# Patient Record
Sex: Male | Born: 1970 | Hispanic: Yes | Marital: Married | Smoking: Never smoker
Health system: Southern US, Community
[De-identification: ages and names within clinical notes are randomized; demographics above are authoritative.]

## PROBLEM LIST (undated history)

## (undated) DIAGNOSIS — I10 Essential (primary) hypertension: Secondary | ICD-10-CM

## (undated) DIAGNOSIS — F419 Anxiety disorder, unspecified: Secondary | ICD-10-CM

## (undated) DIAGNOSIS — R7303 Prediabetes: Secondary | ICD-10-CM

## (undated) DIAGNOSIS — E7800 Pure hypercholesterolemia, unspecified: Secondary | ICD-10-CM

## (undated) HISTORY — DX: Essential (primary) hypertension: I10

---

## 2016-01-13 ENCOUNTER — Encounter (HOSPITAL_COMMUNITY): Payer: Self-pay | Admitting: *Deleted

## 2016-01-13 DIAGNOSIS — R112 Nausea with vomiting, unspecified: Secondary | ICD-10-CM | POA: Insufficient documentation

## 2016-01-13 DIAGNOSIS — R1013 Epigastric pain: Secondary | ICD-10-CM | POA: Insufficient documentation

## 2016-01-13 LAB — URINALYSIS, ROUTINE W REFLEX MICROSCOPIC
BILIRUBIN URINE: NEGATIVE
GLUCOSE, UA: NEGATIVE mg/dL
Ketones, ur: NEGATIVE mg/dL
Leukocytes, UA: NEGATIVE
Nitrite: NEGATIVE
Protein, ur: NEGATIVE mg/dL
SPECIFIC GRAVITY, URINE: 1.014 (ref 1.005–1.030)
pH: 7.5 (ref 5.0–8.0)

## 2016-01-13 LAB — CBC
HCT: 41.8 % (ref 39.0–52.0)
HEMOGLOBIN: 14.7 g/dL (ref 13.0–17.0)
MCH: 31.1 pg (ref 26.0–34.0)
MCHC: 35.2 g/dL (ref 30.0–36.0)
MCV: 88.4 fL (ref 78.0–100.0)
PLATELETS: 224 10*3/uL (ref 150–400)
RBC: 4.73 MIL/uL (ref 4.22–5.81)
RDW: 13.3 % (ref 11.5–15.5)
WBC: 8.9 10*3/uL (ref 4.0–10.5)

## 2016-01-13 LAB — COMPREHENSIVE METABOLIC PANEL
ALBUMIN: 3.3 g/dL — AB (ref 3.5–5.0)
ALK PHOS: 81 U/L (ref 38–126)
ALT: 31 U/L (ref 17–63)
AST: 17 U/L (ref 15–41)
Anion gap: 8 (ref 5–15)
BUN: 8 mg/dL (ref 6–20)
CALCIUM: 8.6 mg/dL — AB (ref 8.9–10.3)
CHLORIDE: 105 mmol/L (ref 101–111)
CO2: 25 mmol/L (ref 22–32)
CREATININE: 0.75 mg/dL (ref 0.61–1.24)
GFR calc Af Amer: >60 mL/min (ref >60)
GFR calc non Af Amer: >60 mL/min (ref >60)
GLUCOSE: 119 mg/dL — AB (ref 65–99)
Potassium: 3.7 mmol/L (ref 3.5–5.1)
SODIUM: 138 mmol/L (ref 135–145)
Total Bilirubin: 0.8 mg/dL (ref 0.3–1.2)
Total Protein: 7.2 g/dL (ref 6.5–8.1)

## 2016-01-13 LAB — URINE MICROSCOPIC-ADD ON

## 2016-01-13 LAB — LIPASE, BLOOD: LIPASE: 23 U/L (ref 11–51)

## 2016-01-13 NOTE — ED Notes (Signed)
The pt is c/o abd  Pain especially in his epigastric area for 2 days  n v  No diarrhea

## 2016-01-14 ENCOUNTER — Emergency Department (HOSPITAL_COMMUNITY)
Admission: EM | Admit: 2016-01-14 | Discharge: 2016-01-14 | Disposition: A | Discharge status: Home / Self Care | Payer: Self-pay | Attending: Emergency Medicine | Admitting: Emergency Medicine

## 2016-01-14 DIAGNOSIS — R112 Nausea with vomiting, unspecified: Secondary | ICD-10-CM

## 2016-01-14 DIAGNOSIS — R1013 Epigastric pain: Secondary | ICD-10-CM

## 2016-01-14 MED ORDER — GI COCKTAIL ~~LOC~~
30.0000 mL | Freq: Once | ORAL | Status: AC
  Administered 2016-01-14: 30 mL via ORAL
  Filled 2016-01-14: qty 30

## 2016-01-14 MED ORDER — ONDANSETRON HCL 4 MG/2ML IJ SOLN
4.0000 mg | Freq: Once | INTRAMUSCULAR | Status: AC
  Administered 2016-01-14: 4 mg via INTRAVENOUS
  Filled 2016-01-14: qty 2

## 2016-01-14 MED ORDER — METOCLOPRAMIDE HCL 10 MG PO TABS: 10.0000 mg | ORAL_TABLET | Freq: Four times a day (QID) | ORAL | Status: DC | PRN

## 2016-01-14 MED ORDER — OMEPRAZOLE 20 MG PO CPDR: 20.0000 mg | DELAYED_RELEASE_CAPSULE | Freq: Every day | ORAL | Status: DC

## 2016-01-14 MED ORDER — SODIUM CHLORIDE 0.9 % IV SOLN: 1000.0000 mL | INTRAVENOUS | Status: DC

## 2016-01-14 MED ORDER — PANTOPRAZOLE SODIUM 40 MG IV SOLR
40.0000 mg | Freq: Once | INTRAVENOUS | Status: AC
  Administered 2016-01-14: 40 mg via INTRAVENOUS
  Filled 2016-01-14: qty 40

## 2016-01-14 MED ORDER — SODIUM CHLORIDE 0.9 % IV SOLN
1000.0000 mL | Freq: Once | INTRAVENOUS | Status: AC
  Administered 2016-01-14: 1000 mL via INTRAVENOUS

## 2016-01-14 NOTE — ED Notes (Signed)
Translator phone used to give meds.

## 2016-01-14 NOTE — ED Provider Notes (Signed)
CSN: [id number]     Arrival date & time 01/13/16  2149 History  By signing my name below, I, Evon Slack, attest that this documentation has been prepared under the direction and in the presence of Dione Booze, MD. Electronically Signed: Evon Slack, ED Scribe. 01/14/2016. 2:19 AM.    Chief Complaint  Patient presents with  . Abdominal Pain    Patient is a 45 y.o. male presenting with abdominal pain. The history is provided by the patient. No language interpreter was used.  Abdominal Pain Associated symptoms: nausea and vomiting   Associated symptoms: no fever    HPI Comments: Carl Briggs is a 45 y.o. male who presents to the Emergency Department complaining of new epigastric abdominal pain onset 2-3 days prior. Pt states that has associate nausea and vomiting. Pt states that the pain is worse when eating. Pt doesn't report any medications PTA. Denies fever or other related symptoms.   History reviewed. No pertinent past medical history. History reviewed. No pertinent past surgical history. No family history on file. Social History  Substance Use Topics  . Smoking status: Never Smoker   . Smokeless tobacco: None  . Alcohol Use: No    Review of Systems  Constitutional: Negative for fever.  Gastrointestinal: Positive for nausea, vomiting and abdominal pain.  All other systems reviewed and are negative.    Allergies  Review of patient's allergies indicates no known allergies.  Home Medications   Prior to Admission medications   Not on File   BP 158/92 mmHg  Pulse 116  Temp(Src) 97.6 F (36.4 C) (Oral)  Resp 18  SpO2 98%   Physical Exam  Constitutional: He is oriented to person, place, and time. He appears well-developed and well-nourished. No distress.  HENT:  Head: Normocephalic and atraumatic.  Eyes: Conjunctivae and EOM are normal. Pupils are equal, round, and reactive to light.  Neck: Normal range of motion. Neck supple. No JVD present.   Cardiovascular: Normal rate, regular rhythm and normal heart sounds.   No murmur heard. Pulmonary/Chest: Effort normal and breath sounds normal. He has no wheezes. He has no rales. He exhibits no tenderness.  Abdominal: Soft. He exhibits no mass. Bowel sounds are decreased. There is tenderness. There is no rebound and no guarding.  Mild to moderates epigastric tenderness.   Musculoskeletal: Normal range of motion. He exhibits no edema.  Lymphadenopathy:    He has no cervical adenopathy.  Neurological: He is alert and oriented to person, place, and time. No cranial nerve deficit. He exhibits normal muscle tone. Coordination normal.  Skin: Skin is warm and dry. No rash noted.  Psychiatric: He has a normal mood and affect. His behavior is normal. Judgment and thought content normal.  Nursing note and vitals reviewed.   ED Course  Procedures (including critical care time) DIAGNOSTIC STUDIES: Oxygen Saturation is 98% on RA, normal by my interpretation.    COORDINATION OF CARE: 2:18 AM-Discussed treatment plan with pt at bedside and pt agreed to plan.     Labs Review Results for orders placed or performed during the hospital encounter of 01/14/16  Lipase, blood  Result Value Ref Range   Lipase 23 11 - 51 U/L  Comprehensive metabolic panel  Result Value Ref Range   Sodium 138 135 - 145 mmol/L   Potassium 3.7 3.5 - 5.1 mmol/L   Chloride 105 101 - 111 mmol/L   CO2 25 22 - 32 mmol/L   Glucose, Bld 119 (H) 65 - 99 mg/dL  BUN 8 6 - 20 mg/dL   Creatinine, Ser 0.45 0.61 - 1.24 mg/dL   Calcium 8.6 (L) 8.9 - 10.3 mg/dL   Total Protein 7.2 6.5 - 8.1 g/dL   Albumin 3.3 (L) 3.5 - 5.0 g/dL   AST 17 15 - 41 U/L   ALT 31 17 - 63 U/L   Alkaline Phosphatase 81 38 - 126 U/L   Total Bilirubin 0.8 0.3 - 1.2 mg/dL   GFR calc non Af Amer >60 >60 mL/min   GFR calc Af Amer >60 >60 mL/min   Anion gap 8 5 - 15  CBC  Result Value Ref Range   WBC 8.9 4.0 - 10.5 K/uL   RBC 4.73 4.22 - 5.81 MIL/uL    Hemoglobin 14.7 13.0 - 17.0 g/dL   HCT 40.9 81.1 - 91.4 %   MCV 88.4 78.0 - 100.0 fL   MCH 31.1 26.0 - 34.0 pg   MCHC 35.2 30.0 - 36.0 g/dL   RDW 78.2 95.6 - 21.3 %   Platelets 224 150 - 400 K/uL  Urinalysis, Routine w reflex microscopic (not at [facility])  Result Value Ref Range   Color, Urine YELLOW YELLOW   APPearance CLOUDY (A) CLEAR   Specific Gravity, Urine 1.014 1.005 - 1.030   pH 7.5 5.0 - 8.0   Glucose, UA NEGATIVE NEGATIVE mg/dL   Hgb urine dipstick TRACE (A) NEGATIVE   Bilirubin Urine NEGATIVE NEGATIVE   Ketones, ur NEGATIVE NEGATIVE mg/dL   Protein, ur NEGATIVE NEGATIVE mg/dL   Nitrite NEGATIVE NEGATIVE   Leukocytes, UA NEGATIVE NEGATIVE  Urine microscopic-add on  Result Value Ref Range   Squamous Epithelial / LPF 0-5 (A) NONE SEEN   WBC, UA 0-5 0 - 5 WBC/hpf   RBC / HPF 0-5 0 - 5 RBC/hpf   Bacteria, UA RARE (A) NONE SEEN   Casts GRANULAR CAST (A) NEGATIVE  I have personally reviewed and evaluated these lab results as part of my medical decision-making.   MDM   Final diagnoses:  Epigastric pain  Nausea and vomiting, vomiting of unspecified type    Epigastric pain and vomiting. This may be a viral illness or possible Gastritis or ulcer disease or GERD. Screening labs are obtained and are unremarkable. He has no prior records and the [facility] system. He was given IV fluids, ondansetron, GI cocktail, and IV pantoprazole. Following this, he was feeling much better. He is discharged with prescriptions for metoclopramide and omeprazole.  I personally performed the services described in this documentation, which was scribed in my presence. The recorded information has been reviewed and is accurate.         Dione Booze, MD 01/14/16 [phone]

## 2016-01-14 NOTE — Discharge Instructions (Signed)
Dolor abdominal en adultos (Abdominal Pain, Adult) El dolor puede tener muchas causas. Normalmente la causa del dolor abdominal no es una enfermedad y Teacher, English as a foreign language sin Clinical research associate. Frecuentemente puede controlarse y tratarse en casa. Su mdico le Chartered certified accountant examen fsico y posiblemente solicite anlisis de sangre y radiografas para ayudar a Teacher, adult education la gravedad de su dolor. Sin embargo, en [employer], debe transcurrir ms tiempo antes de que se pueda Pension scheme manager una causa evidente del dolor. Antes de llegar a ese punto, es posible que su mdico no sepa si necesita ms pruebas o un tratamiento ms profundo. INSTRUCCIONES PARA EL CUIDADO EN EL HOGAR  Est atento al dolor para ver si hay cambios. Las siguientes indicaciones ayudarn a Chief Strategy Officer que pueda sentir:  [place] solo medicamentos de venta libre o recetados, segn las indicaciones del mdico.  No tome laxantes a menos que se lo haya indicado su mdico.  Pruebe con Ardelia Mems dieta lquida absoluta (caldo, t o agua) segn se lo indique su mdico. Introduzca gradualmente una dieta normal, segn su tolerancia. SOLICITE ATENCIN MDICA SI:  Tiene dolor abdominal sin explicacin.  Tiene dolor abdominal relacionado con nuseas o diarrea.  Tiene dolor cuando orina o defeca.  Experimenta dolor abdominal que lo despierta de noche.  Tiene dolor abdominal que empeora o mejora cuando come alimentos.  Tiene dolor abdominal que empeora cuando come alimentos grasosos.  Tiene fiebre. SOLICITE ATENCIN MDICA DE INMEDIATO SI:   El dolor no desaparece en un plazo mximo de 2horas.  No deja de (vomitar).  El Social research officer, government se siente solo en partes del abdomen, como el lado derecho o la parte inferior izquierda del abdomen.  Evaca materia fecal sanguinolenta o negra, de aspecto alquitranado. ASEGRESE DE QUE:  Comprende estas instrucciones.  Controlar su afeccin.  Recibir ayuda de inmediato si no mejora o si empeora.   Esta  informacin no tiene Marine scientist el consejo del mdico. Asegrese de hacerle al mdico cualquier pregunta que tenga.   Document Released: 12/09/2005 Document Revised: 12/30/2014 Elsevier Interactive Patient Education 2016 [place] y Vmitos (Nausea and Vomiting) La nusea es la sensacin de Tree surgeon en el estmago o de la necesidad de vomitar. El vmito es un reflejo por el que los contenidos del estmago salen por la boca. El vmito puede ocasionar prdida de lquidos del organismo (deshidratacin). Los nios y los [employer] pueden deshidratarse rpidamente (en especial si tambin tienen diarrea). Las nuseas y los vmitos son sntoma de un trastorno o enfermedad. Es importante Energy manager causa de los sntomas. CAUSAS  Irritacin directa de la membrana que cubre el [place]. Esta irritacin puede ser resultado del aumento de la produccin de cido, (reflujo gastroesofgico), infecciones, intoxicacin alimentaria, ciertos medicamentos (como antinflamatorios no esteroideos), consumo de alcohol o de tabaco.  Seales del cerebro.Estas seales pueden ser un dolor de cabeza, exposicin al calor, trastornos del odo interno, aumento de la presin en el cerebro por lesiones, infeccin, un tumor o conmocin cerebral, estmulos emocionales o problemas metablicos.  Una obstruccin en el tracto gastrointestinal (obstruccin intestinal).  Ciertas enfermedades como la diabetes, problemas en la vescula biliar, apendicitis, problemas renales, cncer, sepsis, sntomas atpicos de infarto o trastornos alimentarios.  Tratamientos mdicos como la quimioterapia y la radiacin.  Medicamentos que inducen al sueo (anestesia general) durante Clementeen Hoof. DIAGNSTICO  El mdico podr solicitarle algunos anlisis si los problemas no mejoran luego de algunos das. Tambin podrn pedirle anlisis si los sntomas son graves o si el motivo de los vmitos  o las nuseas no est claro. Los  [employer] ser:   Anlisis de [place].  Anlisis de [place].  Pruebas de materia fecal.  Cultivos (para buscar evidencias de infeccin).  Radiografas u otros estudios por imgenes. Los [employer] de las pruebas lo ayudarn al mdico a tomar decisiones acerca del mejor curso de tratamiento o la necesidad de [employer].  TRATAMIENTO  Debe estar bien hidratado. Beba con frecuencia pequeas cantidades de lquido.Puede beber agua, bebidas deportivas, caldos claros o comer pequeos trocitos de hielo o gelatina para mantenerse hidratado.Cuando coma, hgalo lentamente para evitar las nuseas.Hay medicamentos para evitar las nuseas que pueden aliviarlo.  INSTRUCCIONES PARA EL CUIDADO DOMICILIARIO  Si su mdico le prescribe medicamentos tmelos como se le haya indicado.  Si no tiene hambre, no se fuerce a comer. Sin embargo, es necesario que tome lquidos.  Si tiene hambre alimntese con una dieta normal, a menos que el mdico le indique otra cosa.  Los mejores alimentos son Neomia Dear combinacin de carbohidratos complejos (arroz, trigo, papas, pan), carnes magras, yogur, frutas y Sports administrator.  Evite los alimentos ricos en grasas porque dificultan la digestin.  Beba gran cantidad de lquido para mantener la orina de tono claro o color amarillo plido.  Si est deshidratado, consulte a su mdico para que le d instrucciones especficas para volver a hidratarlo. Los signos de deshidratacin son:  Franz Dell sed.  Labios y boca secos.  Mareos.  Larose Kells.  Disminucin de la frecuencia y cantidad de la [place].  Confusin.  Tiene el pulso o la respiracin acelerados. SOLICITE ATENCIN MDICA DE INMEDIATO SI:  Vomita sangre o algo similar a la borra del caf.  La materia fecal (heces) es negra o tiene [place].  Sufre una cefalea grave o rigidez en el cuello.  Se siente confundido.  Siente dolor abdominal intenso.  Tiene dolor en el pecho o dificultad para respirar.  No  orina por 8 horas.  Tiene la piel fra y pegajosa.  Sigue vomitando durante ms de 24 a 48 horas.  Tiene fiebre. ASEGRESE QUE:   Comprende estas instrucciones.  Controlar su enfermedad.  Solicitar ayuda inmediatamente si no mejora o si empeora.   Esta informacin no tiene Theme park manager el consejo del mdico. Asegrese de hacerle al mdico cualquier pregunta que tenga.   Document Released: 12/29/2007 Document Revised: 03/02/2012 Elsevier Interactive Patient Education 2016 [employer].  Metoclopramide tablets Qu es este medicamento? La METOCLOPRAMIDA se Ladonna Snide para tratar los sntomas de la enfermedad del reflujo gastroesofgico (ERGE) como el acidez estomacal. Tambin se [place] para tratar pacientes con capacidad lenta para vaciar el estmago y el tracto intestinal. Este medicamento puede ser utilizado para otros usos; si tiene alguna pregunta consulte con su proveedor de atencin mdica o con su farmacutico. Qu le debo informar a mi profesional de la salud antes de tomar este medicamento? Necesita saber si usted presenta alguno de los [employer] o situaciones: -cncer de mama -depresin -diabetes -insuficiencia cardiaca -alta presin sangunea -enfermedad renal -enfermedad heptica -enfermedad de Parkinson o un trastorno de movimiento -feocromocitoma -convulsiones -obstruccin, sangrado o perforacin estomacal -una reaccin alrgica o inusual a la metoclopramida, procainamida, sulfitos, a otros medicamentos, alimentos, colorantes o conservantes -si est embarazada o buscando quedar embarazada -si est amamantando a un beb Cmo debo utilizar este medicamento? Tome este medicamento por va oral con un vaso de agua. Siga las instrucciones de la etiqueta del [place]. Tome este medicamento con el estmago vaco, por lo menos 30 minutos antes de comer. [employer]  sus dosis a intervalos regulares. No tome su medicamento con una frecuencia mayor a la  indicada. No deje de tomarlo excepto si as lo indica su mdico o su profesional de [employer]. Su farmacutico le dar una [place] del medicamento especial con cada receta y relleno. Asegrese de leer esta informacin cada vez cuidadosamente. Hable con su pediatra para informarse acerca del uso de este medicamento en nios. Puede requerir atencin especial. Sobredosis: Pngase en contacto inmediatamente con un centro toxicolgico o una sala de urgencia si usted cree que haya tomado demasiado medicamento. ATENCIN: [employer] es solo para usted. No comparta este medicamento con nadie. Qu sucede si me olvido de una dosis? Si olvida una dosis, tmela lo antes posible. Si es casi la hora de la prxima dosis, tome slo esa dosis. No tome dosis adicionales o dobles. Qu puede interactuar con este medicamento? -acetaminofeno -ciclosporina -digoxina -medicamentos para la presin sangunea -medicamentos para la diabetes, incluso la insulina -medicamentos para la fiebre del heno y Education officer, environmental -medicamentos para la depresin, especialmente un inhibidor de monoamina oxidasa (IMAO) -medicamentos para la enfermedad de Parkinson, como levodopa -analgsicos o medicamentos para dormir -Tax adviser Puede ser que esta lista no menciona todas las posibles interacciones. Informe a su profesional de [employer] de [employer] productos a base de hierbas, medicamentos de [place] o suplementos nutritivos que est tomando. Si usted fuma, consume bebidas alcohlicas o si utiliza drogas ilegales, indqueselo tambin a su profesional de [employer]. Algunas sustancias pueden interactuar con su medicamento. A qu debo estar atento al usar [employer]? Es posible que transcurran algunas semanas para que su problema estomacal mejore. Sin embargo, no tome este medicamento durante ms de 12 semanas. Cuanto ms tiempo tome [employer], y cuanto ms se toma, mayor son sus posibilidades de Pensions consultant  secundarios graves. Si es un paciente de edad [place], una mujer o si tiene diabetes podr Warehouse manager un mayor riesgo de experimentar efectos secundarios de [employer]. Comunquese con su mdico inmediatamente si empieza a experimentar movimientos que no puede controlar como, chasquido de labios, movimientos rpidos de [address], movimientos incontrolables o involuntarios de los ojos, [place], brazos y piernas o espasmos musculares. Los pacientes y sus familias deben estar atentos si empeora la depresin o ideas suicidas. Tambin est atento a cambios repentinos o severos de emocin, tales como el sentirse ansioso, agitado, lleno de pnico, irritable, hostil, agresivo, impulsivo, inquietud severa, demasiado excitado y hiperactivo o dificultad para conciliar el sueo. Si esto ocurre, especialmente al comenzar con el tratamiento o al cambiar de dosis, comunquese con su mdico. No se trate usted mismo para fiebre alta. Consulte a su mdico o su profesional de la salud por asesoramiento. Puede experimentar mareos o somnolencia. No conduzca ni utilice maquinaria ni haga nada que Scientist, research (life sciences) en estado de alerta hasta que sepa cmo le afecta este medicamento. No se siente ni se ponga de pie con rapidez, especialmente si es un paciente de edad avanzada. Esto reduce el riesgo de mareos o [employer]. El alcohol puede aumentar los mareos y la somnolencia. Evite consumir bebidas alcohlicas. Qu efectos secundarios puedo tener al [employer] este medicamento? Efectos secundarios que debe informar a su mdico o a Producer, television/film/video de la salud tan pronto como sea posible: -Therapist, art como erupcin cutnea, picazn o urticarias, hinchazn de la cara, labios o lengua -produccin de [employer] anormal en mujeres -agrandamiento de las tetillas (hombres) o los pechos (mujeres) -cambio en la manera de caminar -dificultad para  moverse, hablar o tragar -babeo, relamerse los labios o movimientos rpidos de la  lengua -sudoracin excesiva -fiebre -movimientos involuntarios o incontrolables de ojos, cabeza, brazos y piernas -latidos cardiacos irregulares o palpitaciones -espasmos o tics nerviosos musculares -cansancio o debilidad inusual Efectos secundarios que, por lo general, no requieren atencin mdica (debe informarlos a su mdico o a su profesional de la salud si persisten o si son molestos): -cambios en el deseo sexual o capacidad -humor deprimido -diarrea -dificultad para dormir -dolor de cabeza -cambios en la menstruacin -inquietud o nerviosismo Puede ser que esta lista no menciona todos los posibles efectos secundarios. Comunquese a su mdico por asesoramiento mdico [employer]. Usted puede informar los efectos secundarios a la FDA por telfono al 1-800-FDA-1088. Dnde debo guardar mi medicina? Mantngala fuera del alcance de los nios. Gurdela a [employer], entre 20 y 25 grados C (60 y 80 grados F). Protjala de la luz. Mantenga el envase bien cerrado. Deseche los medicamentos que no haya utilizado, despus de la fecha de vencimiento. ATENCIN: Este folleto es un resumen. Puede ser que no cubra toda la posible informacin. Si usted tiene preguntas acerca de esta medicina, consulte con su mdico, su farmacutico o su profesional de Radiographer, therapeutic.    2016, Elsevier/Gold Standard. (2015-01-31 00:00:00)  Omeprazole tablets (OTC) Qu es este medicamento? El OMEPRAZOL previene la produccin de cido en el estmago. Ayuda a tratar los sntomas de la acidez estomacal. Santel medicamento se puede adquirir sin receta. Este producto no debe usarse por perodos prolongados, a menos que su mdico o su profesional de Radiographer, therapeutic. Este medicamento puede ser utilizado para otros usos; si tiene alguna pregunta consulte con su proveedor de atencin mdica o con su farmacutico. Qu le debo informar a mi profesional de la salud antes de tomar este medicamento? Necesita saber si  usted presenta alguno de los siguientes problemas o situaciones: -heces de color oscuro o con sangre -dolor en el pecho -dificultad para tragar -si ha tenido Merchant navy officer durante ms de 3 meses -si tiene Merchant navy officer con [employer], aturdimiento o sudoracin -enfermedad heptica -dolor estomacal -prdida de peso que no tiene explicacin -vmito con Retail buyer -sibilancias -una reaccin alrgica o inusual al omeprazol, a otros medicamentos, alimentos, colorantes o conservantes -si est embarazada o buscando quedar embarazada -si est amamantando a un beb Cmo debo utilizar este medicamento? Tome este medicamento por va oral. Siga las instrucciones de la etiqueta del [place]. Si est tomando este medicamento sin receta, toma una tableta cada da. No usar durante ms de [address] o repetir un curso de tratamiento ms a menudo que cada 4 meses a menos que as lo indique su mdico o profesional de Radiographer, therapeutic. Tome su dosis a intervalos regulares cada 24 horas. Trague la tableta entera con un vaso de agua. No triture, rompe ni mstique este medicamento. Este medicamento acta mejor si se lo toma con el estmago vaco 30 minutos antes del desayuno. Si esta usando este medicamento con una receta proporcionada por su mdico o profesional de la salud, siga las instrucciones que recibi. No tome su medicamento con una frecuencia mayor a la indicada. Hable con su pediatra para informarse acerca del uso de este medicamento en nios. Puede requerir atencin especial. Sobredosis: Pngase en contacto inmediatamente con un centro toxicolgico o una sala de urgencia si usted cree que haya tomado demasiado medicamento. ATENCIN: [employer] es solo para usted. No comparta este medicamento con nadie. Qu sucede si me olvido de una dosis?  Si olvida una dosis, tmela lo antes posible. Si es casi la hora de la prxima dosis, tome slo esa dosis. No tome dosis adicionales o dobles. Qu puede interactuar con  este medicamento? No tome esta medicina con ninguno de los siguientes medicamentos: -atazanavir -clopidogrel -nelfinavir Esta medicina tambin puede interactuar con los siguientes medicamentos: -ampicilina -ciertos medicamentos para la ansiedad o para conciliar el sueo -ciertos medicamentos que tratan o previenen cogulos sanguneos, como warfarina -ciclosporina -diazepam -digoxina -disulfiram -sales de hierro -metotrexato -mofetil micofenolato -fenitona -medicamentos recetados para infecciones micticas o por levadura, tales como itraconazol, quetoconazol, voriconazol -saquinavir -tacrolimo Puede ser que esta lista no menciona todas las posibles interacciones. Informe a su profesional de [employer] de [employer] productos a base de hierbas, medicamentos de [place] o suplementos nutritivos que est tomando. Si usted fuma, consume bebidas alcohlicas o si utiliza drogas ilegales, indqueselo tambin a su profesional de [employer]. Algunas sustancias pueden interactuar con su medicamento. A qu debo estar atento al usar [employer]? Puede necesitar varios das de tratamiento para que mejore su acidez estomacal. Consulte a su mdico o a su profesional de la salud si su afeccin no comienza a Scientist, clinical (histocompatibility and immunogenetics) o si empeora. No trate la diarrea con productos de [employer]. Comunquese con su mdico si tiene diarrea que dura ms de 2 das o si es severa y [place]. No se trate usted mismo para la acidez estomacal con este medicamento durante ms de 14 das seguidos. Slo debe automedicarse con este medicamento durante perodos de tratamiento de 2 semanas cada 4 meses. Si sus sntomas vuelven a aparecer poco despus de haber finalizado el tratamiento o dentro de los prximos 4 meses, consulte a su mdico o su profesional de Radiographer, therapeutic. Qu efectos secundarios puedo tener al [employer] este medicamento? Efectos secundarios que debe informar a su mdico o a Producer, television/film/video de la salud tan pronto como sea  posible: -Therapist, art como erupcin cutnea, picazn o urticarias, hinchazn de la cara, labios o lengua -dolor de hueso, msculo o articulaciones -problemas respiratorios -dolor u opresin en el pecho -orina de color amarillo oscuro o marrn -diarrea -mareos -pulso cardiaco rpido, irregular -sensacin de desmayos o aturdimiento -fiebre o dolor de garganta -espasmos musculares -palpitaciones -enrojecimiento, formacin de ampollas, descamacin o distensin de la piel, inclusive dentro de la boca -convulsiones -temblores -sangrado o magulladuras inusuales -cansancio o debilidad inusual -color amarillento de los ojos o la piel Efectos secundarios que, por lo general, no requieren atencin mdica (debe informarlos a su mdico o a su profesional de la salud si persisten o si son molestos): -estreimiento -boca seca -dolor de cabeza -heces sueltas -nauseas Puede ser que esta lista no menciona todos los posibles efectos secundarios. Comunquese a su mdico por asesoramiento mdico [employer]. Usted puede informar los efectos secundarios a la FDA por telfono al 1-800-FDA-1088. Dnde debo guardar mi medicina? Mantngala fuera del alcance de los nios. Gurdela a [employer], entre 20 y 25 grados C (63 y 41 grados F). Protjala de la luz y de la humedad. Deseche los medicamentos que no haya utilizado, despus de la fecha de vencimiento. ATENCIN: Este folleto es un resumen. Puede ser que no cubra toda la posible informacin. Si usted tiene preguntas acerca de esta medicina, consulte con su mdico, su farmacutico o su profesional de Radiographer, therapeutic.    2016, Elsevier/Gold Standard. (2015-01-31 00:00:00)

## 2018-11-07 ENCOUNTER — Inpatient Hospital Stay (HOSPITAL_COMMUNITY): Payer: Self-pay

## 2018-11-07 ENCOUNTER — Encounter (HOSPITAL_COMMUNITY): Payer: Self-pay

## 2018-11-07 ENCOUNTER — Inpatient Hospital Stay (HOSPITAL_COMMUNITY)
Admission: EM | Admit: 2018-11-07 | Discharge: 2018-11-09 | DRG: 378 | Disposition: A | Discharge status: Home / Self Care | Payer: Self-pay | Attending: Internal Medicine | Admitting: Internal Medicine

## 2018-11-07 ENCOUNTER — Other Ambulatory Visit: Payer: Self-pay

## 2018-11-07 DIAGNOSIS — D62 Acute posthemorrhagic anemia: Secondary | ICD-10-CM | POA: Diagnosis present

## 2018-11-07 DIAGNOSIS — K5731 Diverticulosis of large intestine without perforation or abscess with bleeding: Principal | ICD-10-CM | POA: Diagnosis present

## 2018-11-07 DIAGNOSIS — K625 Hemorrhage of anus and rectum: Secondary | ICD-10-CM

## 2018-11-07 DIAGNOSIS — D649 Anemia, unspecified: Secondary | ICD-10-CM

## 2018-11-07 DIAGNOSIS — K922 Gastrointestinal hemorrhage, unspecified: Secondary | ICD-10-CM

## 2018-11-07 DIAGNOSIS — Z79899 Other long term (current) drug therapy: Secondary | ICD-10-CM

## 2018-11-07 HISTORY — DX: Gastrointestinal hemorrhage, unspecified: K92.2

## 2018-11-07 LAB — CBC WITH DIFFERENTIAL/PLATELET
Abs Immature Granulocytes: 0.01 10*3/uL (ref 0.00–0.07)
Basophils Absolute: 0 10*3/uL (ref 0.0–0.1)
Basophils Relative: 0 %
EOS ABS: 0.1 10*3/uL (ref 0.0–0.5)
EOS PCT: 2 %
HCT: 20.6 % — ABNORMAL LOW (ref 39.0–52.0)
Hemoglobin: 6.3 g/dL — CL (ref 13.0–17.0)
Immature Granulocytes: 0 %
Lymphocytes Relative: 40 %
Lymphs Abs: 1.9 10*3/uL (ref 0.7–4.0)
MCH: 29.6 pg (ref 26.0–34.0)
MCHC: 30.6 g/dL (ref 30.0–36.0)
MCV: 96.7 fL (ref 80.0–100.0)
Monocytes Absolute: 0.3 10*3/uL (ref 0.1–1.0)
Monocytes Relative: 6 %
NRBC: 0 % (ref 0.0–0.2)
Neutro Abs: 2.4 10*3/uL (ref 1.7–7.7)
Neutrophils Relative %: 52 %
PLATELETS: 183 10*3/uL (ref 150–400)
RBC: 2.13 MIL/uL — AB (ref 4.22–5.81)
RDW: 14 % (ref 11.5–15.5)
WBC: 4.7 10*3/uL (ref 4.0–10.5)

## 2018-11-07 LAB — POC OCCULT BLOOD, ED: Fecal Occult Bld: POSITIVE — AB

## 2018-11-07 LAB — ABO/RH: ABO/RH(D): O POS

## 2018-11-07 LAB — COMPREHENSIVE METABOLIC PANEL
ALBUMIN: 3.3 g/dL — AB (ref 3.5–5.0)
ALK PHOS: 63 U/L (ref 38–126)
ALT: 18 U/L (ref 0–44)
ANION GAP: 4 — AB (ref 5–15)
AST: 20 U/L (ref 15–41)
BUN: 9 mg/dL (ref 6–20)
CALCIUM: 8 mg/dL — AB (ref 8.9–10.3)
CO2: 24 mmol/L (ref 22–32)
Chloride: 110 mmol/L (ref 98–111)
Creatinine, Ser: 0.66 mg/dL (ref 0.61–1.24)
GFR calc non Af Amer: >60 mL/min (ref >60)
Glucose, Bld: 108 mg/dL — ABNORMAL HIGH (ref 70–99)
POTASSIUM: 3.8 mmol/L (ref 3.5–5.1)
SODIUM: 138 mmol/L (ref 135–145)
Total Bilirubin: 0.4 mg/dL (ref 0.3–1.2)
Total Protein: 5.8 g/dL — ABNORMAL LOW (ref 6.5–8.1)

## 2018-11-07 LAB — PREPARE RBC (CROSSMATCH)

## 2018-11-07 MED ORDER — IOHEXOL 300 MG/ML  SOLN
100.0000 mL | Freq: Once | INTRAMUSCULAR | Status: AC | PRN
  Administered 2018-11-07: 100 mL via INTRAVENOUS

## 2018-11-07 MED ORDER — POTASSIUM CHLORIDE IN NACL 20-0.9 MEQ/L-% IV SOLN
INTRAVENOUS | Status: DC
  Administered 2018-11-07 – 2018-11-08 (×2): via INTRAVENOUS
  Filled 2018-11-07 (×4): qty 1000

## 2018-11-07 MED ORDER — PANTOPRAZOLE SODIUM 40 MG IV SOLR
40.0000 mg | Freq: Two times a day (BID) | INTRAVENOUS | Status: DC
  Administered 2018-11-07 – 2018-11-09 (×4): 40 mg via INTRAVENOUS
  Filled 2018-11-07 (×4): qty 40

## 2018-11-07 MED ORDER — PEG 3350-KCL-NA BICARB-NACL 420 G PO SOLR
4000.0000 mL | Freq: Once | ORAL | Status: AC
  Administered 2018-11-08: 4000 mL via ORAL
  Filled 2018-11-07 (×2): qty 4000

## 2018-11-07 MED ORDER — SODIUM CHLORIDE 0.9% IV SOLUTION
Freq: Once | INTRAVENOUS | Status: AC
  Administered 2018-11-07: 17:00:00 via INTRAVENOUS

## 2018-11-07 MED ORDER — SODIUM CHLORIDE 0.9 % IV SOLN
INTRAVENOUS | Status: DC
  Administered 2018-11-07: 1 mL via INTRAVENOUS

## 2018-11-07 NOTE — ED Notes (Addendum)
Family now at bedside. Pt denies any discomfort or pain at this time.

## 2018-11-07 NOTE — H&P (Signed)
PCP:   Patient, No Pcp Per   Chief Complaint:  Rectal bleeding  HPI: this is a healthy 47 y/o gentleman who presents with started feeling bad since Tuesday when he developed stomach ache and diarrhea.  His diarrhea was initially watery and then it became lightly bloody and more recently frank bright red blood.  He denies abdominal pain but states he more has a discomfort on the sides of his abdomen.  He also reports a headache.  He states he also began swelling on his arms and on his face.  He states he also started getting dizzy and he is nauseous but he has not actually vomited.  He has never had a colonoscopy.  He denies any fevers, chills, rash or confusion.  He denies any chest pain or being short of breath.  No notes in his family has a symptoms he denies eating anything different from his family members on Monday.  He did see a physician, likely at the urgent care he was given some medications but it did not help him.  He finally decided to come to our ER.  He has never had a colonoscopy.  In the ER his hemoglobin was 6.3 the hospitalist have been asked to admit the patient.  History provided by the patient and his wife is present at bedside.  A translator was used  Review of Systems:  The patient denies anorexia, fever, weight loss,, vision loss, decreased hearing, hoarseness, chest pain, syncope, dyspnea on exertion, peripheral edema, balance deficits, hemoptysis, abdominal pain, melena, hematochezia, severe indigestion/heartburn, BRBPR, hematuria, incontinence, genital sores, muscle weakness, suspicious skin lesions, transient blindness, difficulty walking, depression, unusual weight change, abnormal bleeding, enlarged lymph nodes, angioedema, and breast masses.  Past Medical History: History reviewed. None History reviewed. None  Medications: Prior to Admission medications   Medication Sig Start Date End Date Taking? Authorizing Provider  acetaminophen (TYLENOL) 325 MG tablet Take 325  mg by mouth every 6 (six) hours as needed for headache.   Yes [provider]  omeprazole (PRILOSEC) 20 MG capsule Take 1 capsule (20 mg total) by mouth daily. 01/14/16  Yes Dione BoozeGlick, David, MD  metoCLOPramide (REGLAN) 10 MG tablet Take 1 tablet (10 mg total) by mouth every 6 (six) hours as needed for nausea or vomiting. Patient not taking: Reported on 11/07/2018 01/14/16   Dione BoozeGlick, David, MD  omeprazole (PRILOSEC) 40 MG capsule Take 40 mg by mouth daily. 11/05/18   [provider]    Allergies:  No Known Allergies  Social History:  reports that he has never smoked. He does not have any smokeless tobacco history on file. He reports that he does not drink alcohol. His drug history is not on file.  Family History: History reviewed. Diabetes  Physical Exam: Vitals:   11/07/18 0911 11/07/18 0912 11/07/18 0915 11/07/18 0930  BP: 120/81  117/80 120/73  Pulse: 86 85 87 83  Resp:  16 12   SpO2: 100% 100% 100% 100%  Weight:      Height:        General:  Alert and oriented times three, well developed and nourished, no acute distress Eyes: PERRLA, pink conjunctiva, no scleral icterus ENT: Moist oral mucosa, neck supple, no thyromegaly Lungs: clear to ascultation, no wheeze, no crackles, no use of accessory muscles Cardiovascular: regular rate and rhythm, no regurgitation, no gallops, no murmurs. No carotid bruits, no JVD Abdomen: soft, positive BS, non-tender, non-distended, no organomegaly, not an acute abdomen GU: not examined Neuro: CN II -  XII grossly intact, sensation intact Musculoskeletal: strength 5/5 all extremities, no clubbing, cyanosis or edema Skin: no rash, no subcutaneous crepitation, no decubitus Psych: appropriate patient   Labs on Admission:  Recent Labs    11/07/18 0918  NA 138  K 3.8  CL 110  CO2 24  GLUCOSE 108*  BUN 9  CREATININE 0.66  CALCIUM 8.0*   Recent Labs    11/07/18 0918  AST 20  ALT 18  ALKPHOS 63  BILITOT 0.4  PROT 5.8*   ALBUMIN 3.3*   No results for input(s): LIPASE, AMYLASE in the last 72 hours. Recent Labs    11/07/18 0918  WBC 4.7  NEUTROABS 2.4  HGB 6.3*  HCT 20.6*  MCV 96.7  PLT 183   No results for input(s): CKTOTAL, CKMB, CKMBINDEX, TROPONINI in the last 72 hours. Invalid input(s): POCBNP No results for input(s): DDIMER in the last 72 hours. No results for input(s): HGBA1C in the last 72 hours. No results for input(s): CHOL, HDL, LDLCALC, TRIG, CHOLHDL, LDLDIRECT in the last 72 hours. No results for input(s): TSH, T4TOTAL, T3FREE, THYROIDAB in the last 72 hours.  Invalid input(s): FREET3 No results for input(s): VITAMINB12, FOLATE, FERRITIN, TIBC, IRON, RETICCTPCT in the last 72 hours.  Micro Results: No results found for this or any previous visit (from the past 240 hour(s)).   Radiological Exams on Admission: No results found.  Assessment/Plan Present on Admission: . GI bleed . Acute posthemorrhagic anemia -Admit to MedSurg with telemetry -N.p.o., IV fluid hydration -IV Protonix twice daily -2 units packed red blood cell transfusion ordered, posttransfusion labs -Serial H&H ordered -Stool cultures ordered/GI biofire -GI consulted by ER staff and will see the patient -Pain meds as needed   Deatrice Spanbauer 11/07/2018, 11:06 AM

## 2018-11-07 NOTE — ED Provider Notes (Signed)
[facility] Provider Note   CSN: [id number] Arrival date & time: 11/07/18  0849     History   Chief Complaint Chief Complaint  Patient presents with  . Rectal Bleeding    HPI Carl Briggs is a 47 y.o. male.  The history is provided by the patient. No language interpreter was used.  Rectal Bleeding  Quality:  Bright red Amount:  Moderate Duration:  2 days Timing:  Constant Chronicity:  New Context: spontaneously   Similar prior episodes: no   Relieved by:  Nothing Worsened by:  Nothing Ineffective treatments:  None tried Risk factors: no anticoagulant use   Pt complains of bright red rectal bleeding.  Pt reports bleeding since yesterday.   Pt feels weak today.  No abdominal pain  History reviewed. No pertinent past medical history.  There are no active problems to display for this patient.   History reviewed. No pertinent surgical history.      Home Medications    Prior to Admission medications   Medication Sig Start Date End Date Taking? Authorizing Provider  metoCLOPramide (REGLAN) 10 MG tablet Take 1 tablet (10 mg total) by mouth every 6 (six) hours as needed for nausea or vomiting. 01/14/16   Dione BoozeGlick, David, MD  omeprazole (PRILOSEC) 20 MG capsule Take 1 capsule (20 mg total) by mouth daily. 01/14/16   Dione BoozeGlick, David, MD    Family History History reviewed. No pertinent family history.  Social History Social History   Tobacco Use  . Smoking status: Never Smoker  Substance Use Topics  . Alcohol use: No  . Drug use: Not on file     Allergies   Patient has no known allergies.   Review of Systems Review of Systems  Gastrointestinal: Positive for blood in stool and hematochezia.  All other systems reviewed and are negative.    Physical Exam Updated Vital Signs BP 120/73   Pulse 83   Resp 12   Ht 5\' 5"  (1.651 m)   Wt 72.6 kg   SpO2 100%   BMI 26.63 kg/m   Physical Exam  Constitutional: He  appears well-developed and well-nourished.  HENT:  Head: Normocephalic and atraumatic.  Right Ear: External ear normal.  Left Ear: External ear normal.  Mouth/Throat: Oropharynx is clear and moist.  Eyes: Conjunctivae are normal.  Neck: Neck supple.  Cardiovascular: Normal rate and regular rhythm.  No murmur heard. Pulmonary/Chest: Effort normal and breath sounds normal.  Abdominal: Soft. There is no tenderness.  Genitourinary:  Genitourinary Comments: Bright red blood  Musculoskeletal: Normal range of motion. He exhibits no edema.  Neurological: He is alert.  Skin: Skin is warm and dry.  Psychiatric: He has a normal mood and affect.  Nursing note and vitals reviewed.    ED Treatments / Results  Labs (all labs ordered are listed, but only abnormal results are displayed) Labs Reviewed  CBC WITH DIFFERENTIAL/PLATELET - Abnormal; Notable for the following components:      Result Value   RBC 2.13 (*)    Hemoglobin 6.3 (*)    HCT 20.6 (*)    All other components within normal limits  COMPREHENSIVE METABOLIC PANEL - Abnormal; Notable for the following components:   Glucose, Bld 108 (*)    Calcium 8.0 (*)    Total Protein 5.8 (*)    Albumin 3.3 (*)    Anion gap 4 (*)    All other components within normal limits  POC OCCULT BLOOD, ED - Abnormal;  Notable for the following components:   Fecal Occult Bld POSITIVE (*)    All other components within normal limits  TYPE AND SCREEN  ABO/RH    EKG None  Radiology No results found.  Procedures .Critical Care Performed by: Elson Areas, PA-C Authorized by: Elson Areas, PA-C   Critical care provider statement:    Critical care time (minutes):  45   Critical care start time:  11/07/2018 10:30 AM   Critical care end time:  11/07/2018 11:04 AM   Critical care time was exclusive of:  Separately billable procedures and treating other patients   Critical care was necessary to treat or prevent imminent or life-threatening  deterioration of the following conditions:  Dehydration and circulatory failure   Critical care was time spent personally by me on the following activities:  Discussions with consultants, evaluation of patient's response to treatment, examination of patient, ordering and performing treatments and interventions, ordering and review of laboratory studies, ordering and review of radiographic studies, pulse oximetry, re-evaluation of patient's condition, obtaining history from patient or surrogate and review of old charts   I assumed direction of critical care for this patient from another provider in my specialty: no     (including critical care time)  Medications Ordered in ED Medications - No data to display   Initial Impression / Assessment and Plan / ED Course  I have reviewed the triage vital signs and the nursing notes.  Pertinent labs & imaging results that were available during my care of the patient were reviewed by me and considered in my medical decision making (see chart for details).     MDM  Hemoglobin 6.3.  I spoke to Dr. Bosie Clos who advised medicine to admit.  Pt is unassigned.  transfusion ordered.  Hospitalist consulted and will see here for admission     Final Clinical Impressions(s) / ED Diagnoses   Final diagnoses:  Rectal bleeding  Anemia, unspecified type    ED Discharge Orders    None       Osie Cheeks 11/07/18 1109    Shaune Pollack, MD 11/08/18 [id number]

## 2018-11-07 NOTE — Consult Note (Signed)
Referring Provider: Dr. Joneen Roachrosley Primary Care Physician:  Patient, No Pcp Per Primary Gastroenterologist:  Gentry FitzUnassigned  Reason for Consultation:  GI bleed  HPI: Carl Briggs is a 47 y.o. male with onset of profuse rectal bleeding that started this past Tuesday when he had 3 episodes of a large amount of red blood from his rectum. Had 2 episodes Wednesday that were of smaller volume. Thursday is BM was normal with only staining of blood seen but yesterday he had a large amount of blood per rectum. Felt nauseous without vomiting. Felt dizzy. Denies melena. Has been having abdominal discomfort (starts in his back and radiates to his front) but denies that it is painful. Denies rectal pain. Hgb 6.3 (14.7 in 2017). Abd/pelvis CT with contrast showed a possible focal small bowel ileus without any obstruction. Other findings per radiology report. Years ago he had an episode of rectal bleeding that was a lot less in volume than now. A few weeks ago he was taking Naproxen daily but denies any this week and denies other NSAIDs. Denies alcohol. No known family history of colon cancer. Has never had a colonoscopy. Wife at bedside. History obtained with medical translator using [employer] system.  History reviewed. No pertinent past medical history.  History reviewed. No pertinent surgical history.  Prior to Admission medications   Medication Sig Start Date End Date Taking? Authorizing Provider  acetaminophen (TYLENOL) 325 MG tablet Take 325 mg by mouth every 6 (six) hours as needed for headache.   Yes [provider]  omeprazole (PRILOSEC) 20 MG capsule Take 1 capsule (20 mg total) by mouth daily. 01/14/16  Yes Dione BoozeGlick, David, MD  metoCLOPramide (REGLAN) 10 MG tablet Take 1 tablet (10 mg total) by mouth every 6 (six) hours as needed for nausea or vomiting. Patient not taking: Reported on 11/07/2018 01/14/16   Dione BoozeGlick, David, MD  omeprazole (PRILOSEC) 40 MG capsule Take 40 mg by mouth daily. 11/05/18    [provider]    Scheduled Meds: . sodium chloride   Intravenous Once  . [START ON 11/08/2018] polyethylene glycol-electrolytes  4,000 mL Oral Once   Continuous Infusions: . sodium chloride     PRN Meds:.  Allergies as of 11/07/2018  . (No Known Allergies)    History reviewed. No pertinent family history.  Social History   Socioeconomic History  . Marital status: Married    Spouse name: Not on file  . Number of children: Not on file  . Years of education: Not on file  . Highest education level: Not on file  Occupational History  . Not on file  Social Needs  . Financial resource strain: Not on file  . Food insecurity:    Worry: Not on file    Inability: Not on file  . Transportation needs:    Medical: Not on file    Non-medical: Not on file  Tobacco Use  . Smoking status: Never Smoker  Substance and Sexual Activity  . Alcohol use: No  . Drug use: Not on file  . Sexual activity: Not on file  Lifestyle  . Physical activity:    Days per week: Not on file    Minutes per session: Not on file  . Stress: Not on file  Relationships  . Social connections:    Talks on phone: Not on file    Gets together: Not on file    Attends religious service: Not on file    Active member of club or organization: Not on file  Attends meetings of clubs or organizations: Not on file    Relationship status: Not on file  . Intimate partner violence:    Fear of current or ex partner: Not on file    Emotionally abused: Not on file    Physically abused: Not on file    Forced sexual activity: Not on file  Other Topics Concern  . Not on file  Social History Narrative  . Not on file    Review of Systems: All negative except as stated above in HPI.  Physical Exam: Vital signs: Vitals:   11/07/18 1428 11/07/18 1443  BP: 117/66 125/80  Pulse: 78 72  Resp: 10 16  Temp: 98 F (36.7 C) 98.4 F (36.9 C)  SpO2: 100%      General:   Lethargic, Well-developed,  well-nourished, pleasant and cooperative in NAD Head: normocephalic, atraumatic Eyes: anicteric sclera ENT: oropharynx clear Neck: supple, nontender Lungs:  Clear throughout to auscultation.   No wheezes, crackles, or rhonchi. No acute distress. Heart:  Regular rate and rhythm; no murmurs, clicks, rubs,  or gallops. Abdomen: soft, nontender, nondistended, +BS  Rectal:  Deferred Ext: no edema  GI:  Lab Results: Recent Labs    11/07/18 0918  WBC 4.7  HGB 6.3*  HCT 20.6*  PLT 183   BMET Recent Labs    11/07/18 0918  NA 138  K 3.8  CL 110  CO2 24  GLUCOSE 108*  BUN 9  CREATININE 0.66  CALCIUM 8.0*   LFT Recent Labs    11/07/18 0918  PROT 5.8*  ALBUMIN 3.3*  AST 20  ALT 18  ALKPHOS 63  BILITOT 0.4   PT/INR No results for input(s): LABPROT, INR in the last 72 hours.   Studies/Results: Ct Abdomen Pelvis W Contrast  Result Date: 11/07/2018 CLINICAL DATA:  Generalized weakness and bloody stools the past 4 days. Diffuse abdominal pain radiating to the back during that time. Nausea. EXAM: CT ABDOMEN AND PELVIS WITH CONTRAST TECHNIQUE: Multidetector CT imaging of the abdomen and pelvis was performed using the standard protocol following bolus administration of intravenous contrast. CONTRAST:  OMNIPAQUE IOHEXOL 300 MG/ML  SOLN COMPARISON:  None. FINDINGS: Lower chest: Breathing motion artifacts. Small left lower lobe nodule measuring 3 mm on image number 43 series 5. Hepatobiliary: Diffuse low density of the liver. Probable adjacent gallstones in the gallbladder neck spanning 6 mm. No gallbladder wall thickening or pericholecystic fluid. Pancreas: Unremarkable. No pancreatic ductal dilatation or surrounding inflammatory changes. Spleen: Normal in size without focal abnormality. Adrenals/Urinary Tract: Normal appearing adrenal glands. Right renal cyst. Normal appearing left kidney, ureters and urinary bladder. Stomach/Bowel: Multiple proximal sigmoid colon diverticula  without evidence of diverticulitis. Mildly prominent small bowel loops containing air-fluid levels in the left mid abdomen anteriorly with no wall thickening. No abnormally dilated loops are seen. Normal appearing stomach and appendix. Vascular/Lymphatic: No significant vascular findings are present. No enlarged abdominal or pelvic lymph nodes. Reproductive: Prostate is unremarkable. Other: No abdominal wall hernia or abnormality. No abdominopelvic ascites. Musculoskeletal: On minimal lumbar and lower thoracic spine degenerative changes. IMPRESSION: 1. Mildly prominent small bowel loops containing air-fluid levels in the left mid abdomen anteriorly with no wall thickening. This is most likely due to mild focal ileus. 2. Sigmoid diverticulosis. 3. Diffuse hepatic steatosis. 4. Cholelithiasis. 5. 3 mm left lower lobe nodule. No follow-up needed if patient is low-risk. Non-contrast chest CT can be considered in 12 months if patient is high-risk. This recommendation follows the consensus statement:  Guidelines for Management of Incidental Pulmonary Nodules Detected on CT Images: From the Fleischner Society 2017; Radiology 2017; 284:228-243. Electronically Signed   By: Beckie Salts M.D.   On: 11/07/2018 13:14    Impression/Plan: Rectal bleeding likely due to a diverticular source but malignancy still possible. Doubt ischemic colitis. Agree with blood transfusions. Supportive care. Will give colon prep tomorrow for an inpt colonoscopy in Monday 11/09/18. Risks/benefits of colonoscopy discussed with patient through medical translator and he agrees to proceed. Clear liquid diet today and tomorrow and NPO p MN on Sunday night for Monday's colonoscopy. He is concerned about swelling in his face and hands that started yesterday and will defer to primary team.     LOS: 0 days   Shirley Friar  11/07/2018, 3:57 PM  Questions please call [phone]

## 2018-11-07 NOTE — ED Triage Notes (Signed)
Pt arrived via POV; pt c/o wkness related to bloody stools since Tues; pt states that he hears ringing in his ears. Pt states that he had tiny bit of bleeding a year ago but not like now; pt c/o nausea; pt c/o of abd pain that radiates around abd to back.

## 2018-11-08 ENCOUNTER — Encounter (HOSPITAL_COMMUNITY): Payer: Self-pay

## 2018-11-08 ENCOUNTER — Other Ambulatory Visit: Payer: Self-pay

## 2018-11-08 DIAGNOSIS — D649 Anemia, unspecified: Secondary | ICD-10-CM

## 2018-11-08 LAB — HEMOGLOBIN AND HEMATOCRIT, BLOOD
HCT: 25.7 % — ABNORMAL LOW (ref 39.0–52.0)
HCT: 27.3 % — ABNORMAL LOW (ref 39.0–52.0)
HEMOGLOBIN: 8.2 g/dL — AB (ref 13.0–17.0)
HEMOGLOBIN: 8.9 g/dL — AB (ref 13.0–17.0)

## 2018-11-08 LAB — GASTROINTESTINAL PANEL BY PCR, STOOL (REPLACES STOOL CULTURE)
Adenovirus F40/41: NOT DETECTED
Astrovirus: NOT DETECTED
Campylobacter species: NOT DETECTED
Cryptosporidium: NOT DETECTED
Cyclospora cayetanensis: NOT DETECTED
ENTAMOEBA HISTOLYTICA: NOT DETECTED
ENTEROAGGREGATIVE E COLI (EAEC): NOT DETECTED
ENTEROPATHOGENIC E COLI (EPEC): DETECTED — AB
Enterotoxigenic E coli (ETEC): NOT DETECTED
GIARDIA LAMBLIA: NOT DETECTED
NOROVIRUS GI/GII: NOT DETECTED
Plesimonas shigelloides: NOT DETECTED
Rotavirus A: NOT DETECTED
SALMONELLA SPECIES: NOT DETECTED
SHIGELLA/ENTEROINVASIVE E COLI (EIEC): NOT DETECTED
Sapovirus (I, II, IV, and V): NOT DETECTED
Shiga like toxin producing E coli (STEC): NOT DETECTED
VIBRIO CHOLERAE: NOT DETECTED
Vibrio species: NOT DETECTED
YERSINIA ENTEROCOLITICA: NOT DETECTED

## 2018-11-08 LAB — BASIC METABOLIC PANEL
Anion gap: 6 (ref 5–15)
BUN: 7 mg/dL (ref 6–20)
CHLORIDE: 104 mmol/L (ref 98–111)
CO2: 23 mmol/L (ref 22–32)
CREATININE: 0.8 mg/dL (ref 0.61–1.24)
Calcium: 7.7 mg/dL — ABNORMAL LOW (ref 8.9–10.3)
GFR calc non Af Amer: >60 mL/min (ref >60)
Glucose, Bld: 87 mg/dL (ref 70–99)
POTASSIUM: 3.7 mmol/L (ref 3.5–5.1)
Sodium: 133 mmol/L — ABNORMAL LOW (ref 135–145)

## 2018-11-08 LAB — CBC
HCT: 27.2 % — ABNORMAL LOW (ref 39.0–52.0)
HEMOGLOBIN: 9.2 g/dL — AB (ref 13.0–17.0)
MCH: 30 pg (ref 26.0–34.0)
MCHC: 33.8 g/dL (ref 30.0–36.0)
MCV: 88.6 fL (ref 80.0–100.0)
PLATELETS: 186 10*3/uL (ref 150–400)
RBC: 3.07 MIL/uL — AB (ref 4.22–5.81)
RDW: 13.7 % (ref 11.5–15.5)
WBC: 4.5 10*3/uL (ref 4.0–10.5)
nRBC: 0 % (ref 0.0–0.2)

## 2018-11-08 LAB — HIV ANTIBODY (ROUTINE TESTING W REFLEX): HIV Screen 4th Generation wRfx: NONREACTIVE

## 2018-11-08 NOTE — Progress Notes (Signed)
PROGRESS NOTE    Carl Briggs  UJW:[id number] DOB: 11-20-1971 DOA: 11/07/2018 PCP: Patient, No Pcp Per  Brief Narrative: Carl Briggs is a 47 y.o. male with onset of profuse rectal bleeding x4days -Multiple episodes of large amount of hematochezia/bright red blood per rectum, denied any abdominal pain, no nausea vomiting no fevers or chills. -ED his hemoglobin was 6.8, Hemoccult positive   Assessment & Plan:  Acute lower GI bleeding/hematochezia -Painless, likely diverticular -Transfused 2 units of PRBC on admission last night -Appreciate gastroenterology consult -Currently on clears, plan for colonoscopy tomorrow    Acute posthemorrhagic anemia -As above, monitor CBC every 12   DVT prophylaxis:SCds Code Status: Full code Family Communication: Wife at bedside Disposition Plan: Home pending above work-up  Consultants:   [facility]   Procedures:   Antimicrobials:    Subjective: -Feels better, no further significant bleeding had just trace amount of dark blood this morning  Objective: Vitals:   11/07/18 1821 11/07/18 1836 11/07/18 2205 11/08/18 0823  BP: 109/70 116/71 107/71 104/88  Pulse: 67 69  74  Resp: 18 18 16    Temp: 98.6 F (37 C) 98.9 F (37.2 C) 98.6 F (37 C) 98.2 F (36.8 C)  TempSrc: Oral Oral Oral Oral  SpO2: 100% 100% 99% 99%  Weight:      Height:        Intake/Output Summary (Last 24 hours) at 11/08/2018 1149 Last data filed at 11/08/2018 0600 Gross per 24 hour  Intake 1550 ml  Output -  Net 1550 ml   Filed Weights   11/07/18 0858  Weight: 72.6 kg    Examination:  General exam: Appears calm and comfortable  Respiratory system: Clear to auscultation. Respiratory effort normal. Cardiovascular system: S1 & S2 heard, RRR. Gastrointestinal system: Abdomen is nondistended, soft and nontender.Normal bowel sounds heard. Central nervous system: Alert and oriented. No focal neurological deficits. Extremities:  No edema Skin: No rashes, lesions or ulcers Psychiatry: Judgement and insight appear normal. Mood & affect appropriate.     Data Reviewed:   CBC: Recent Labs  Lab 11/07/18 0918 11/08/18 0027 11/08/18 0937  WBC 4.7  --   --   NEUTROABS 2.4  --   --   HGB 6.3* 8.2* 8.9*  HCT 20.6* 25.7* 27.3*  MCV 96.7  --   --   PLT 183  --   --    Basic Metabolic Panel: Recent Labs  Lab 11/07/18 0918 11/08/18 0027  NA 138 133*  K 3.8 3.7  CL 110 104  CO2 24 23  GLUCOSE 108* 87  BUN 9 7  CREATININE 0.66 0.80  CALCIUM 8.0* 7.7*   GFR: Estimated Creatinine Clearance: 98.2 mL/min (by C-G formula based on SCr of 0.8 mg/dL). Liver Function Tests: Recent Labs  Lab 11/07/18 0918  AST 20  ALT 18  ALKPHOS 63  BILITOT 0.4  PROT 5.8*  ALBUMIN 3.3*   No results for input(s): LIPASE, AMYLASE in the last 168 hours. No results for input(s): AMMONIA in the last 168 hours. Coagulation Profile: No results for input(s): INR, PROTIME in the last 168 hours. Cardiac Enzymes: No results for input(s): CKTOTAL, CKMB, CKMBINDEX, TROPONINI in the last 168 hours. BNP (last 3 results) No results for input(s): PROBNP in the last 8760 hours. HbA1C: No results for input(s): HGBA1C in the last 72 hours. CBG: No results for input(s): GLUCAP in the last 168 hours. Lipid Profile: No results for input(s): CHOL, HDL, LDLCALC, TRIG, CHOLHDL, LDLDIRECT in the  last 72 hours. Thyroid Function Tests: No results for input(s): TSH, T4TOTAL, FREET4, T3FREE, THYROIDAB in the last 72 hours. Anemia Panel: No results for input(s): VITAMINB12, FOLATE, FERRITIN, TIBC, IRON, RETICCTPCT in the last 72 hours. Urine analysis:    Component Value Date/Time   COLORURINE YELLOW 01/13/2016 2200   APPEARANCEUR CLOUDY (A) 01/13/2016 2200   LABSPEC 1.014 01/13/2016 2200   PHURINE 7.5 01/13/2016 2200   GLUCOSEU NEGATIVE 01/13/2016 2200   HGBUR TRACE (A) 01/13/2016 2200   BILIRUBINUR NEGATIVE 01/13/2016 2200   KETONESUR  NEGATIVE 01/13/2016 2200   PROTEINUR NEGATIVE 01/13/2016 2200   NITRITE NEGATIVE 01/13/2016 2200   LEUKOCYTESUR NEGATIVE 01/13/2016 2200   Sepsis Labs: @LABRCNTIP (procalcitonin:4,lacticidven:4)  )No results found for this or any previous visit (from the past 240 hour(s)).       Radiology Studies: Ct Abdomen Pelvis W Contrast  Result Date: 11/07/2018 CLINICAL DATA:  Generalized weakness and bloody stools the past 4 days. Diffuse abdominal pain radiating to the back during that time. Nausea. EXAM: CT ABDOMEN AND PELVIS WITH CONTRAST TECHNIQUE: Multidetector CT imaging of the abdomen and pelvis was performed using the standard protocol following bolus administration of intravenous contrast. CONTRAST:  OMNIPAQUE IOHEXOL 300 MG/ML  SOLN COMPARISON:  None. FINDINGS: Lower chest: Breathing motion artifacts. Small left lower lobe nodule measuring 3 mm on image number 43 series 5. Hepatobiliary: Diffuse low density of the liver. Probable adjacent gallstones in the gallbladder neck spanning 6 mm. No gallbladder wall thickening or pericholecystic fluid. Pancreas: Unremarkable. No pancreatic ductal dilatation or surrounding inflammatory changes. Spleen: Normal in size without focal abnormality. Adrenals/Urinary Tract: Normal appearing adrenal glands. Right renal cyst. Normal appearing left kidney, ureters and urinary bladder. Stomach/Bowel: Multiple proximal sigmoid colon diverticula without evidence of diverticulitis. Mildly prominent small bowel loops containing air-fluid levels in the left mid abdomen anteriorly with no wall thickening. No abnormally dilated loops are seen. Normal appearing stomach and appendix. Vascular/Lymphatic: No significant vascular findings are present. No enlarged abdominal or pelvic lymph nodes. Reproductive: Prostate is unremarkable. Other: No abdominal wall hernia or abnormality. No abdominopelvic ascites. Musculoskeletal: On minimal lumbar and lower thoracic spine  degenerative changes. IMPRESSION: 1. Mildly prominent small bowel loops containing air-fluid levels in the left mid abdomen anteriorly with no wall thickening. This is most likely due to mild focal ileus. 2. Sigmoid diverticulosis. 3. Diffuse hepatic steatosis. 4. Cholelithiasis. 5. 3 mm left lower lobe nodule. No follow-up needed if patient is low-risk. Non-contrast chest CT can be considered in 12 months if patient is high-risk. This recommendation follows the consensus statement: Guidelines for Management of Incidental Pulmonary Nodules Detected on CT Images: From the Fleischner Society 2017; Radiology 2017; 284:228-243. Electronically Signed   By: Beckie Salts M.D.   On: 11/07/2018 13:14        Scheduled Meds: . pantoprazole (PROTONIX) IV  40 mg Intravenous Q12H  . polyethylene glycol-electrolytes  4,000 mL Oral Once   Continuous Infusions: . sodium chloride 1 mL (11/07/18 1832)  . 0.9 % NaCl with KCl 20 mEq / L 100 mL/hr at 11/08/18 0600     LOS: 1 day    Time spent:    Zannie Cove, MD [facility] Page via [email or web address], password [email or web address] After 7PM please contact night-coverage  11/08/2018, 11:49 AM

## 2018-11-08 NOTE — H&P (View-Only) (Signed)
[facility] Progress Note  Carl Briggs 47 y.o. 03/24/1971   Subjective: Very small amount of blood overnight with formed stool. Denies abdominal pain.  Objective: Vital signs: Vitals:   11/07/18 2205 11/08/18 0823  BP: 107/71 104/88  Pulse:  74  Resp: 16   Temp: 98.6 F (37 C) 98.2 F (36.8 C)  SpO2: 99% 99%    Physical Exam: Gen: alert, no acute distress  HEENT: anicteric sclera CV: RRR Chest: CTA B Abd: soft, nontender, nondistended, +BS   Lab Results: Recent Labs    11/07/18 0918 11/08/18 0027  NA 138 133*  K 3.8 3.7  CL 110 104  CO2 24 23  GLUCOSE 108* 87  BUN 9 7  CREATININE 0.66 0.80  CALCIUM 8.0* 7.7*   Recent Labs    11/07/18 0918  AST 20  ALT 18  ALKPHOS 63  BILITOT 0.4  PROT 5.8*  ALBUMIN 3.3*   Recent Labs    11/07/18 0918 11/08/18 0027 11/08/18 0937  WBC 4.7  --   --   NEUTROABS 2.4  --   --   HGB 6.3* 8.2* 8.9*  HCT 20.6* 25.7* 27.3*  MCV 96.7  --   --   PLT 183  --   --       Assessment/Plan: Painless hematochezia likely due to a diverticular source but malignancy possible. Starting colon prep today for colonoscopy that is scheduled on 11/09/18 to be done by Dr. Outlaw. Clear liquid diet today and NPO p MN.   Mahdiya Mossberg C Katrinna Travieso 11/08/2018, 1:50 PM  Questions please call [phone] ID: Carl Briggs, male   DOB: 11/06/1971, 47 y.o.   MRN: [id number]  

## 2018-11-08 NOTE — Progress Notes (Signed)
[facility]  Carl Briggs 47 y.o. 09/07/1971   Subjective: Very small amount of blood overnight with formed stool. Denies abdominal pain.  Objective: Vital signs: Vitals:   11/07/18 2205 11/08/18 0823  BP: 107/71 104/88  Pulse:  74  Resp: 16   Temp: 98.6 F (37 C) 98.2 F (36.8 C)  SpO2: 99% 99%    Physical Exam: Gen: alert, no acute distress  HEENT: anicteric sclera CV: RRR Chest: CTA B Abd: soft, nontender, nondistended, +BS   Lab Results: Recent Labs    11/07/18 0918 11/08/18 0027  NA 138 133*  K 3.8 3.7  CL 110 104  CO2 24 23  GLUCOSE 108* 87  BUN 9 7  CREATININE 0.66 0.80  CALCIUM 8.0* 7.7*   Recent Labs    11/07/18 0918  AST 20  ALT 18  ALKPHOS 63  BILITOT 0.4  PROT 5.8*  ALBUMIN 3.3*   Recent Labs    11/07/18 0918 11/08/18 0027 11/08/18 0937  WBC 4.7  --   --   NEUTROABS 2.4  --   --   HGB 6.3* 8.2* 8.9*  HCT 20.6* 25.7* 27.3*  MCV 96.7  --   --   PLT 183  --   --       Assessment/Plan: Painless hematochezia likely due to a diverticular source but malignancy possible. Starting colon prep today for colonoscopy that is scheduled on 11/09/18 to be done by Dr. Dulce Sellarutlaw. Clear liquid diet today and NPO p MN.   Shirley FriarVincent C Keyuana Wank 11/08/2018, 1:50 PM  Questions please call [phone] ID: Carl BooneJose Luis Briggs, male   DOB: 09/07/1971, 47 y.o.   MRN: [id number]

## 2018-11-08 NOTE — Progress Notes (Signed)
CRITICAL VALUE ALERT  Critical Value:  GI Panel by PCR, Stool positive for Enteropathogenic E Coli (EPEC)  Date & Time Notied: 1615 11/08/18  Provider Notified: Zannie CoveJoseph, Preetha  Orders Received/Actions taken: Per callback no new orders to be given at this time.

## 2018-11-09 ENCOUNTER — Inpatient Hospital Stay (HOSPITAL_COMMUNITY): Payer: Self-pay | Admitting: Certified Registered"

## 2018-11-09 ENCOUNTER — Encounter (HOSPITAL_COMMUNITY): Payer: Self-pay

## 2018-11-09 ENCOUNTER — Telehealth: Payer: Self-pay

## 2018-11-09 ENCOUNTER — Encounter (HOSPITAL_COMMUNITY)
Admission: EM | Disposition: A | Discharge status: Home / Self Care | Payer: Self-pay | Source: Home / Self Care | Attending: Internal Medicine

## 2018-11-09 DIAGNOSIS — K625 Hemorrhage of anus and rectum: Secondary | ICD-10-CM

## 2018-11-09 HISTORY — PX: COLONOSCOPY WITH PROPOFOL: SHX5780

## 2018-11-09 LAB — CBC
HEMATOCRIT: 26.6 % — AB (ref 39.0–52.0)
Hemoglobin: 8.5 g/dL — ABNORMAL LOW (ref 13.0–17.0)
MCH: 28.7 pg (ref 26.0–34.0)
MCHC: 32 g/dL (ref 30.0–36.0)
MCV: 89.9 fL (ref 80.0–100.0)
NRBC: 0 % (ref 0.0–0.2)
PLATELETS: 213 10*3/uL (ref 150–400)
RBC: 2.96 MIL/uL — ABNORMAL LOW (ref 4.22–5.81)
RDW: 13.6 % (ref 11.5–15.5)
WBC: 4.2 10*3/uL (ref 4.0–10.5)

## 2018-11-09 LAB — BASIC METABOLIC PANEL
Anion gap: 5 (ref 5–15)
BUN: 6 mg/dL (ref 6–20)
CALCIUM: 8 mg/dL — AB (ref 8.9–10.3)
CO2: 23 mmol/L (ref 22–32)
Chloride: 111 mmol/L (ref 98–111)
Creatinine, Ser: 0.79 mg/dL (ref 0.61–1.24)
GFR calc Af Amer: >60 mL/min (ref >60)
GLUCOSE: 97 mg/dL (ref 70–99)
Potassium: 4.1 mmol/L (ref 3.5–5.1)
Sodium: 139 mmol/L (ref 135–145)

## 2018-11-09 SURGERY — COLONOSCOPY WITH PROPOFOL
Anesthesia: Monitor Anesthesia Care

## 2018-11-09 MED ORDER — MEPERIDINE HCL 100 MG/ML IJ SOLN: 6.2500 mg | INTRAMUSCULAR | Status: DC | PRN

## 2018-11-09 MED ORDER — MIDAZOLAM HCL 2 MG/2ML IJ SOLN: 0.5000 mg | Freq: Once | INTRAMUSCULAR | Status: DC | PRN

## 2018-11-09 MED ORDER — DEXMEDETOMIDINE HCL 200 MCG/2ML IV SOLN
INTRAVENOUS | Status: DC | PRN
  Administered 2018-11-09: 8 ug via INTRAVENOUS
  Administered 2018-11-09: 12 ug via INTRAVENOUS
  Administered 2018-11-09: 4 ug via INTRAVENOUS
  Administered 2018-11-09: 8 ug via INTRAVENOUS

## 2018-11-09 MED ORDER — PROPOFOL 10 MG/ML IV BOLUS
INTRAVENOUS | Status: DC | PRN
  Administered 2018-11-09: 25 mg via INTRAVENOUS
  Administered 2018-11-09: 8 mg via INTRAVENOUS

## 2018-11-09 MED ORDER — PROPOFOL 500 MG/50ML IV EMUL
INTRAVENOUS | Status: DC | PRN
  Administered 2018-11-09: 100 ug/kg/min via INTRAVENOUS

## 2018-11-09 MED ORDER — PHENYLEPHRINE HCL 10 MG/ML IJ SOLN
INTRAMUSCULAR | Status: DC | PRN
  Administered 2018-11-09 (×2): 80 ug via INTRAVENOUS

## 2018-11-09 MED ORDER — FERROUS SULFATE 325 (65 FE) MG PO TABS: 325.0000 mg | ORAL_TABLET | Freq: Two times a day (BID) | ORAL | 0 refills | Status: DC

## 2018-11-09 MED ORDER — PROMETHAZINE HCL 25 MG/ML IJ SOLN: 6.2500 mg | INTRAMUSCULAR | Status: DC | PRN

## 2018-11-09 MED ORDER — LIDOCAINE HCL (CARDIAC) PF 100 MG/5ML IV SOSY
PREFILLED_SYRINGE | INTRAVENOUS | Status: DC | PRN
  Administered 2018-11-09: 60 mg via INTRAVENOUS

## 2018-11-09 SURGICAL SUPPLY — 22 items

## 2018-11-09 NOTE — Care Management Note (Signed)
Case Management Note  Patient Details  Name: Carl BooneJose Luis Briggs MRN: [id number] Date of Birth: 28-Nov-1971  Subjective/Objective:    From home with wife, presents with GIB, he needs follow up apt at clinic and medication ast at discharge.  Jane with [facility] is assisting in getting follow up apt for patient.  He has transportation at discharge.                Action/Plan: DC home when ready.   Expected Discharge Date:  11/09/18               Expected Discharge Plan:  Home/Self Care  In-House Referral:     Discharge planning Services  CM Consult, [facility], Follow-up appt scheduled, Medication Assistance  Post Acute Care Choice:    Choice offered to:     DME Arranged:    DME Agency:     HH Arranged:    HH Agency:     Status of Service:  Completed, signed off  If discussed at [employer] Meetings, dates discussed:    Additional Comments:  Leone Havenaylor, Emmalena Canny Clinton, RN 11/09/2018, 3:47 PM

## 2018-11-09 NOTE — Interval H&P Note (Signed)
History and Physical Interval Note:  11/09/2018 1:34 PM  [facility]  has presented today for surgery, with the diagnosis of GI bleed  The various methods of treatment have been discussed with the patient and family. After consideration of risks, benefits and other options for treatment, the patient has consented to  Procedure(s): COLONOSCOPY WITH PROPOFOL (N/A) as a surgical intervention .  The patient's history has been reviewed, patient examined, no change in status, stable for surgery.  I have reviewed the patient's chart and labs.  Questions were answered to the patient's satisfaction.     Carl Briggs M  Assessment:  1.  Hematochezia. 2.  Acute blood loss anemia.  Plan:  1.  Colonoscopy. 2.  Risks (bleeding, infection, bowel perforation that could require surgery, sedation-related changes in cardiopulmonary systems), benefits (identification and possible treatment of source of symptoms, exclusion of certain causes of symptoms), and alternatives (watchful waiting, radiographic imaging studies, empiric medical treatment) of colonoscopy were explained to patient/family in detail and patient wishes to proceed.

## 2018-11-09 NOTE — Anesthesia Preprocedure Evaluation (Addendum)
Anesthesia Evaluation  Patient identified by MRN, date of birth, ID band Patient awake    Reviewed: Allergy & Precautions, NPO status , Patient's Chart, lab work & pertinent test results  History of Anesthesia Complications Negative for: history of anesthetic complications  Airway Mallampati: II  TM Distance: >3 FB Neck ROM: Full    Dental  (+) Upper Dentures, Lower Dentures   Pulmonary neg pulmonary ROS,    breath sounds clear to auscultation       Cardiovascular (-) anginanegative cardio ROS   Rhythm:Regular Rate:Normal     Neuro/Psych negative neurological ROS     GI/Hepatic Neg liver ROS, GERD  Medicated and Controlled,  Endo/Other  negative endocrine ROS  Renal/GU negative Renal ROS     Musculoskeletal   Abdominal   Peds  Hematology  (+) Blood dyscrasia (Hb 8.5), anemia ,   Anesthesia Other Findings   Reproductive/Obstetrics                            Anesthesia Physical Anesthesia Plan  ASA: II  Anesthesia Plan: MAC   Post-op Pain Management:    Induction:   PONV Risk Score and Plan: 1 and Treatment may vary due to age or medical condition  Airway Management Planned: Nasal Cannula and Natural Airway  Additional Equipment:   Intra-op Plan:   Post-operative Plan:   Informed Consent: I have reviewed the patients History and Physical, chart, labs and discussed the procedure including the risks, benefits and alternatives for the proposed anesthesia with the patient or authorized representative who has indicated his/her understanding and acceptance.   Dental advisory given  Plan Discussed with: CRNA and Surgeon  Anesthesia Plan Comments: (Plan routine monitors, MAC)        Anesthesia Quick Evaluation

## 2018-11-09 NOTE — Progress Notes (Signed)
PROGRESS NOTE    Carl Briggs  [id number]:[id number] DOB: Jul 09, 1971 DOA: 11/07/2018 PCP: Patient, No Pcp Per  Brief Narrative: Carl Briggs is a 47 y.o. male with onset of profuse rectal bleeding x4days -Multiple episodes of large amount of hematochezia/bright red blood per rectum, denied any abdominal pain, no nausea vomiting no fevers or chills. -ED his hemoglobin was 6.8, Hemoccult positive   Assessment & Plan:  Acute lower GI bleeding/hematochezia -Painless, likely diverticular -Transfused 2 units of PRBC on admission  -Appreciate gastroenterology consult -GI pathogen panel last p.m. with enteropathogenic E. Coli, he does not have ongoing diarrhea, no fever or leukocytosis, unsure if Abx will change course, await GI input -plan for colonoscopy today    Acute posthemorrhagic anemia -Hb stable, no further bleeding   DVT prophylaxis:SCds Code Status: Full code Family Communication: Wife at bedside Disposition Plan: Home pending above work-up  Consultants:   [facility]   Procedures:   Antimicrobials:    Subjective: -Okay, mild right-sided abdominal discomfort, no further diarrhea, bloody stools, no vomiting  Objective: Vitals:   11/08/18 2252 11/09/18 0727 11/09/18 1238 11/09/18 1402  BP: 105/83 103/82 132/82 (!) 85/69  Pulse: 70 71 77 74  Resp: 16  17 17   Temp: 98.2 F (36.8 C) 98.6 F (37 C) 98.2 F (36.8 C) 97.8 F (36.6 C)  TempSrc: Oral Oral Oral Oral  SpO2: 99% 94% 98% 98%  Weight:      Height:        Intake/Output Summary (Last 24 hours) at 11/09/2018 1406 Last data filed at 11/09/2018 0259 Gross per 24 hour  Intake 2098.67 ml  Output -  Net 2098.67 ml   Filed Weights   11/07/18 0858  Weight: 72.6 kg    Examination:  Gen: Awake, Alert, Oriented X 3, no distress HEENT: PERRLA, Neck supple, no JVD Lungs: Good air movement bilaterally, CTAB CVS: RRR,No Gallops,Rubs or new Murmurs Abd: soft, Non tender, non  distended, BS present Extremities: No Cyanosis, Clubbing or edema Skin: no new rashes Psychiatry: Judgement and insight appear normal. Mood & affect appropriate.     Data Reviewed:   CBC: Recent Labs  Lab 11/07/18 0918 11/08/18 0027 11/08/18 0937 11/08/18 1631 11/09/18 0432  WBC 4.7  --   --  4.5 4.2  NEUTROABS 2.4  --   --   --   --   HGB 6.3* 8.2* 8.9* 9.2* 8.5*  HCT 20.6* 25.7* 27.3* 27.2* 26.6*  MCV 96.7  --   --  88.6 89.9  PLT 183  --   --  186 213   Basic Metabolic Panel: Recent Labs  Lab 11/07/18 0918 11/08/18 0027 11/09/18 0432  NA 138 133* 139  K 3.8 3.7 4.1  CL 110 104 111  CO2 24 23 23   GLUCOSE 108* 87 97  BUN 9 7 6   CREATININE 0.66 0.80 0.79  CALCIUM 8.0* 7.7* 8.0*   GFR: Estimated Creatinine Clearance: 98.2 mL/min (by C-G formula based on SCr of 0.79 mg/dL). Liver Function Tests: Recent Labs  Lab 11/07/18 0918  AST 20  ALT 18  ALKPHOS 63  BILITOT 0.4  PROT 5.8*  ALBUMIN 3.3*   No results for input(s): LIPASE, AMYLASE in the last 168 hours. No results for input(s): AMMONIA in the last 168 hours. Coagulation Profile: No results for input(s): INR, PROTIME in the last 168 hours. Cardiac Enzymes: No results for input(s): CKTOTAL, CKMB, CKMBINDEX, TROPONINI in the last 168 hours. BNP (last 3 results) No results for  input(s): PROBNP in the last 8760 hours. HbA1C: No results for input(s): HGBA1C in the last 72 hours. CBG: No results for input(s): GLUCAP in the last 168 hours. Lipid Profile: No results for input(s): CHOL, HDL, LDLCALC, TRIG, CHOLHDL, LDLDIRECT in the last 72 hours. Thyroid Function Tests: No results for input(s): TSH, T4TOTAL, FREET4, T3FREE, THYROIDAB in the last 72 hours. Anemia Panel: No results for input(s): VITAMINB12, FOLATE, FERRITIN, TIBC, IRON, RETICCTPCT in the last 72 hours. Urine analysis:    Component Value Date/Time   COLORURINE YELLOW 01/13/2016 2200   APPEARANCEUR CLOUDY (A) 01/13/2016 2200   LABSPEC  1.014 01/13/2016 2200   PHURINE 7.5 01/13/2016 2200   GLUCOSEU NEGATIVE 01/13/2016 2200   HGBUR TRACE (A) 01/13/2016 2200   BILIRUBINUR NEGATIVE 01/13/2016 2200   KETONESUR NEGATIVE 01/13/2016 2200   PROTEINUR NEGATIVE 01/13/2016 2200   NITRITE NEGATIVE 01/13/2016 2200   LEUKOCYTESUR NEGATIVE 01/13/2016 2200   Sepsis Labs: @LABRCNTIP (procalcitonin:4,lacticidven:4)  ) Recent Results (from the past 240 hour(s))  Gastrointestinal Panel by PCR , Stool     Status: Abnormal   Collection Time: 11/08/18  9:00 AM  Result Value Ref Range Status   Campylobacter species NOT DETECTED NOT DETECTED Final   Plesimonas shigelloides NOT DETECTED NOT DETECTED Final   Salmonella species NOT DETECTED NOT DETECTED Final   Yersinia enterocolitica NOT DETECTED NOT DETECTED Final   Vibrio species NOT DETECTED NOT DETECTED Final   Vibrio cholerae NOT DETECTED NOT DETECTED Final   Enteroaggregative E coli (EAEC) NOT DETECTED NOT DETECTED Final   Enteropathogenic E coli (EPEC) DETECTED (A) NOT DETECTED Final    Comment: RESULT CALLED TO, READ BACK BY AND VERIFIED WITH: JOSH GANOE AT 1613 ON 11/08/18 BY SNJ    Enterotoxigenic E coli (ETEC) NOT DETECTED NOT DETECTED Final   Shiga like toxin producing E coli (STEC) NOT DETECTED NOT DETECTED Final   Shigella/Enteroinvasive E coli (EIEC) NOT DETECTED NOT DETECTED Final   Cryptosporidium NOT DETECTED NOT DETECTED Final   Cyclospora cayetanensis NOT DETECTED NOT DETECTED Final   Entamoeba histolytica NOT DETECTED NOT DETECTED Final   Giardia lamblia NOT DETECTED NOT DETECTED Final   Adenovirus F40/41 NOT DETECTED NOT DETECTED Final   Astrovirus NOT DETECTED NOT DETECTED Final   Norovirus GI/GII NOT DETECTED NOT DETECTED Final   Rotavirus A NOT DETECTED NOT DETECTED Final   Sapovirus (I, II, IV, and V) NOT DETECTED NOT DETECTED Final    Comment: Performed at [facility], [address] [place], [place] [address]         Radiology Studies: No  results found.      Scheduled Meds: . [MAR Hold] pantoprazole (PROTONIX) IV  40 mg Intravenous Q12H   Continuous Infusions: . sodium chloride 1 mL (11/07/18 1832)  . 0.9 % NaCl with KCl 20 mEq / L 50 mL/hr at 11/08/18 1456     LOS: 2 days    Time spent:    Zannie Cove, MD [facility] Page via [email or web address], password [email or web address] After 7PM please contact night-coverage  11/09/2018, 2:06 PM

## 2018-11-09 NOTE — Plan of Care (Signed)
  Problem: Clinical Measurements: Goal: Ability to maintain clinical measurements within normal limits will improve Outcome: Progressing   

## 2018-11-09 NOTE — Transfer of Care (Signed)
Immediate Anesthesia Transfer of Care Note  Patient: Carl Briggs  Procedure(s) Performed: COLONOSCOPY WITH PROPOFOL (N/A )  Patient Location: Endoscopy Unit  Anesthesia Type:MAC  Level of Consciousness: awake, alert  and oriented  Airway & Oxygen Therapy: Patient Spontanous Breathing and Patient connected to nasal cannula oxygen  Post-op Assessment: Report given to RN, Post -op Vital signs reviewed and stable and Patient moving all extremities X 4  Post vital signs: Reviewed and stable  Last Vitals:  Vitals Value Taken Time  BP 85/69 11/09/2018  2:05 PM  Temp 36.6 C 11/09/2018  2:02 PM  Pulse 100 11/09/2018  2:11 PM  Resp 20 11/09/2018  2:11 PM  SpO2 95 % 11/09/2018  2:11 PM  Vitals shown include unvalidated device data.  Last Pain:  Vitals:   11/09/18 1402  TempSrc: Oral  PainSc: 0-No pain      Patients Stated Pain Goal: 0 (79/89/21 1941)  Complications: No apparent anesthesia complications

## 2018-11-09 NOTE — Op Note (Signed)
[facility] Patient Name: Carl Briggs Procedure Date : 11/09/2018 MRN: [id number] Attending MD: Willis Modena , MD Date of Birth: 09/29/1971 CSN: [id number] Age: 47 Admit Type: Inpatient Procedure:                Colonoscopy Indications:              Hematochezia Providers:                Willis Modena, MD, Zoe Lan, RN, Zoila Shutter,                            Technician, Albertina Senegal Beckner, CRNA Referring MD:             [facility] Medicines:                Monitored Anesthesia Care Complications:            No immediate complications. Estimated Blood Loss:     Estimated blood loss: none. Procedure:                Pre-Anesthesia Assessment:                           - Prior to the procedure, a History and Physical                            was performed, and patient medications and                            allergies were reviewed. The patient's tolerance of                            previous anesthesia was also reviewed. The risks                            and benefits of the procedure and the sedation                            options and risks were discussed with the patient.                            All questions were answered, and informed consent                            was obtained. Prior Anticoagulants: The patient has                            taken no previous anticoagulant or antiplatelet                            agents. ASA Grade Assessment: II - A patient with                            mild systemic disease. After reviewing the risks  and benefits, the patient was deemed in                            satisfactory condition to undergo the procedure.                           After obtaining informed consent, the colonoscope                            was passed under direct vision. Throughout the                            procedure, the patient's blood pressure, pulse, and                             oxygen saturations were monitored continuously. The                            [id number]-[id number] ([id number] peds colon was introduced                            through the anus and advanced to the the cecum,                            identified by appendiceal orifice and ileocecal                            valve. The ileocecal valve, appendiceal orifice,                            and rectum were photographed. The entire colon was                            examined. The colonoscopy was performed without                            difficulty. The patient tolerated the procedure                            well. The quality of the bowel preparation was good. Scope In: 1:45:31 PM Scope Out: 1:57:18 PM Scope Withdrawal Time: 0 hours 9 minutes 8 seconds  Total Procedure Duration: 0 hours 11 minutes 47 seconds  Findings:      The perianal and digital rectal examinations were normal.      Many medium-mouthed diverticula were found in the sigmoid colon,       descending colon and ascending colon.      Colon otherwise normal; no other polyps, masses, vascular ectasias, or       inflammatory changes were seen.      The retroflexed view of the distal rectum and anal verge was normal and       showed no anal or rectal abnormalities.      No old or fresh blood was seen to the extent of our examination. Impression:               -  Diverticulosis in the sigmoid colon, in the                            descending colon and in the ascending colon.                           - The distal rectum and anal verge are normal on                            retroflexion view.                           - The examination was otherwise normal. Bleeding                            possibly from low-grade diverticular bleeding. No                            evidence of ongoing GI bleeding at the present time. Moderate Sedation:      None Recommendation:           - Patient has a contact number available for                             emergencies. The signs and symptoms of potential                            delayed complications were discussed with the                            patient. Return to normal activities tomorrow.                            Written discharge instructions were provided to the                            patient.                           - Discharge patient to home (ambulatory).                           - High fiber diet indefinitely.                           - Continue present medications.                           [facility] will sign-off; we can arrange outpatient                            follow-up with [place]; please call with any questions;                            thank you for the consultation.                           -  Will arrange outpatient GI follow-up visit; if                            anemia persists, would need to consider endoscopy                            +/- capsule endoscopy; if that is unrevealing,                            would need to consider outpatient hematology                            consultation. Procedure Code(s):        --- Professional ---                           [phone], Colonoscopy, flexible; diagnostic, including                            collection of specimen(s) by brushing or washing,                            when performed (separate procedure) Diagnosis Code(s):        --- Professional ---                           K92.1, Melena (includes Hematochezia)                           K57.30, Diverticulosis of large intestine without                            perforation or abscess without bleeding CPT copyright 2018 American Medical Association. All rights reserved. The codes documented in this report are preliminary and upon coder review may  be revised to meet current compliance requirements. Willis Modena, MD 11/09/2018 2:06:49 PM This report has been signed electronically. Number of Addenda: 0

## 2018-11-09 NOTE — Telephone Encounter (Signed)
Request from Letha Capeeborah Taylor, RN CM for a hospital follow up appointment for the patient.  An appointment was scheduled for 11/16/18 @ 1430 and the information was placed on the AVS.

## 2018-11-09 NOTE — Discharge Summary (Signed)
Physician Discharge Summary  Carl Briggs UJW:[id number] DOB: November 21, 1971 DOA: 11/07/2018  PCP: Patient, No Pcp Per  Admit date: 11/07/2018 Discharge date: 11/09/2018  Time spent: 35 minutes  Recommendations for Outpatient Follow-up:  PCP in 1 week Gi Dr.Outlaw in 1 month  Discharge Diagnoses:  Principal Problem:   Diverticular bleeding   Acute posthemorrhagic anemia   GI bleeding   Rectal bleeding   Discharge Condition: stable  Diet recommendation: regular  Filed Weights   11/07/18 0858  Weight: 72.6 kg    History of present illness:  [employer] a 47 y.o.malewith onset of profuse rectal bleeding x4days -Multiple episodes of large amount of hematochezia/bright red blood per rectum, denied any abdominal pain, no nausea vomiting no fevers or chills. -ED his hemoglobin was 6.8, Hemoccult positive   Hospital Course:   Acute lower GI bleeding/hematochezia -Painless, likely diverticular -Transfused 2 units of PRBC on admission  -GIastroenterology consulted, colonoscopy today noted diverticulosis without active bleeding -GI pathogen panel last p.m. with enteropathogenic E. Coli, he does not have ongoing diarrhea, no fever or leukocytosis, no indication for Abx at this time -start Fe in 1 week -check CBC in 1-2weeks    Acute posthemorrhagic anemia -Hb stable, no further bleeding  Procedures: Impression:               - Diverticulosis in the sigmoid colon, in the                            descending colon and in the ascending colon.                           - The distal rectum and anal verge are normal on                            retroflexion view.                           - The examination was otherwise normal. Bleeding                            possibly from low-grade diverticular bleeding. No                             evidence of ongoing GI bleeding at the present time  Consultations:  Gi   Discharge Exam: Vitals:   11/09/18  1410 11/09/18 1448  BP: (!) 91/54 100/72  Pulse: 83 97  Resp: 11 15  Temp:  (!) 97.4 F (36.3 C)  SpO2: 97% 97%    General: AAOx3 Cardiovascular:S1s2/RRR Respiratory: CTAB  Discharge Instructions   Discharge Instructions    Diet - low sodium heart healthy   Complete by:  As directed    Increase activity slowly   Complete by:  As directed      Allergies as of 11/09/2018   No Known Allergies     Medication List    STOP taking these medications   metoCLOPramide 10 MG tablet Commonly known as:  REGLAN     TAKE these medications   acetaminophen 325 MG tablet Commonly known as:  TYLENOL Take 325 mg by mouth every 6 (six) hours as needed for headache.   ferrous sulfate 325 (65  FE) MG tablet Take 1 tablet (325 mg total) by mouth 2 (two) times daily with a meal.   omeprazole 20 MG capsule Commonly known as:  PRILOSEC Take 1 capsule (20 mg total) by mouth daily. What changed:  Another medication with the same name was removed. Continue taking this medication, and follow the directions you see here.      No Known Allergies Follow-up Information    Willis Modenautlaw, William, MD. Schedule an appointment as soon as possible for a visit in 1 month(s).   Specialty:  Gastroenterology Contact information: [address] [place] [address] [phone]            The results of significant diagnostics from this hospitalization (including imaging, microbiology, ancillary and laboratory) are listed below for reference.    Significant Diagnostic Studies: Ct Abdomen Pelvis W Contrast  Result Date: 11/07/2018 CLINICAL DATA:  Generalized weakness and bloody stools the past 4 days. Diffuse abdominal pain radiating to the back during that time. Nausea. EXAM: CT ABDOMEN AND PELVIS WITH CONTRAST TECHNIQUE: Multidetector CT imaging of the abdomen and pelvis was performed using the standard protocol following bolus administration of intravenous contrast. CONTRAST:  100mL  OMNIPAQUE IOHEXOL 300 MG/ML  SOLN COMPARISON:  None. FINDINGS: Lower chest: Breathing motion artifacts. Small left lower lobe nodule measuring 3 mm on image number 43 series 5. Hepatobiliary: Diffuse low density of the liver. Probable adjacent gallstones in the gallbladder neck spanning 6 mm. No gallbladder wall thickening or pericholecystic fluid. Pancreas: Unremarkable. No pancreatic ductal dilatation or surrounding inflammatory changes. Spleen: Normal in size without focal abnormality. Adrenals/Urinary Tract: Normal appearing adrenal glands. Right renal cyst. Normal appearing left kidney, ureters and urinary bladder. Stomach/Bowel: Multiple proximal sigmoid colon diverticula without evidence of diverticulitis. Mildly prominent small bowel loops containing air-fluid levels in the left mid abdomen anteriorly with no wall thickening. No abnormally dilated loops are seen. Normal appearing stomach and appendix. Vascular/Lymphatic: No significant vascular findings are present. No enlarged abdominal or pelvic lymph nodes. Reproductive: Prostate is unremarkable. Other: No abdominal wall hernia or abnormality. No abdominopelvic ascites. Musculoskeletal: On minimal lumbar and lower thoracic spine degenerative changes. IMPRESSION: 1. Mildly prominent small bowel loops containing air-fluid levels in the left mid abdomen anteriorly with no wall thickening. This is most likely due to mild focal ileus. 2. Sigmoid diverticulosis. 3. Diffuse hepatic steatosis. 4. Cholelithiasis. 5. 3 mm left lower lobe nodule. No follow-up needed if patient is low-risk. Non-contrast chest CT can be considered in 12 months if patient is high-risk. This recommendation follows the consensus statement: Guidelines for Management of Incidental Pulmonary Nodules Detected on CT Images: From the Fleischner Society 2017; Radiology 2017; 284:228-243. Electronically Signed   By: Beckie SaltsSteven  Reid M.D.   On: 11/07/2018 13:14    Microbiology: Recent Results  (from the past 240 hour(s))  Gastrointestinal Panel by PCR , Stool     Status: Abnormal   Collection Time: 11/08/18  9:00 AM  Result Value Ref Range Status   Campylobacter species NOT DETECTED NOT DETECTED Final   Plesimonas shigelloides NOT DETECTED NOT DETECTED Final   Salmonella species NOT DETECTED NOT DETECTED Final   Yersinia enterocolitica NOT DETECTED NOT DETECTED Final   Vibrio species NOT DETECTED NOT DETECTED Final   Vibrio cholerae NOT DETECTED NOT DETECTED Final   Enteroaggregative E coli (EAEC) NOT DETECTED NOT DETECTED Final   Enteropathogenic E coli (EPEC) DETECTED (A) NOT DETECTED Final    Comment: RESULT CALLED TO, READ BACK BY AND  VERIFIED WITH: Thana Ates AT 1613 ON 11/08/18 BY SNJ    Enterotoxigenic E coli (ETEC) NOT DETECTED NOT DETECTED Final   Shiga like toxin producing E coli (STEC) NOT DETECTED NOT DETECTED Final   Shigella/Enteroinvasive E coli (EIEC) NOT DETECTED NOT DETECTED Final   Cryptosporidium NOT DETECTED NOT DETECTED Final   Cyclospora cayetanensis NOT DETECTED NOT DETECTED Final   Entamoeba histolytica NOT DETECTED NOT DETECTED Final   Giardia lamblia NOT DETECTED NOT DETECTED Final   Adenovirus F40/41 NOT DETECTED NOT DETECTED Final   Astrovirus NOT DETECTED NOT DETECTED Final   Norovirus GI/GII NOT DETECTED NOT DETECTED Final   Rotavirus A NOT DETECTED NOT DETECTED Final   Sapovirus (I, II, IV, and V) NOT DETECTED NOT DETECTED Final    Comment: Performed at [facility], [address] [place], [place] [address]     Labs: Basic Metabolic Panel: Recent Labs  Lab 11/07/18 0918 11/08/18 0027 11/09/18 0432  NA 138 133* 139  K 3.8 3.7 4.1  CL 110 104 111  CO2 24 23 23   GLUCOSE 108* 87 97  BUN 9 7 6   CREATININE 0.66 0.80 0.79  CALCIUM 8.0* 7.7* 8.0*   Liver Function Tests: Recent Labs  Lab 11/07/18 0918  AST 20  ALT 18  ALKPHOS 63  BILITOT 0.4  PROT 5.8*  ALBUMIN 3.3*   No results for input(s): LIPASE, AMYLASE in the  last 168 hours. No results for input(s): AMMONIA in the last 168 hours. CBC: Recent Labs  Lab 11/07/18 0918 11/08/18 0027 11/08/18 0937 11/08/18 1631 11/09/18 0432  WBC 4.7  --   --  4.5 4.2  NEUTROABS 2.4  --   --   --   --   HGB 6.3* 8.2* 8.9* 9.2* 8.5*  HCT 20.6* 25.7* 27.3* 27.2* 26.6*  MCV 96.7  --   --  88.6 89.9  PLT 183  --   --  186 213   Cardiac Enzymes: No results for input(s): CKTOTAL, CKMB, CKMBINDEX, TROPONINI in the last 168 hours. BNP: BNP (last 3 results) No results for input(s): BNP in the last 8760 hours.  ProBNP (last 3 results) No results for input(s): PROBNP in the last 8760 hours.  CBG: No results for input(s): GLUCAP in the last 168 hours.     Signed:  Zannie Cove MD.  [facility] 11/09/2018, 2:54 PM

## 2018-11-09 NOTE — Anesthesia Postprocedure Evaluation (Signed)
Anesthesia Post Note  Patient: Carl Briggs  Procedure(s) Performed: COLONOSCOPY WITH PROPOFOL (N/A )     Patient location during evaluation: PACU Anesthesia Type: MAC Level of consciousness: awake and alert Pain management: pain level controlled Vital Signs Assessment: post-procedure vital signs reviewed and stable Respiratory status: spontaneous breathing, nonlabored ventilation, respiratory function stable and patient connected to nasal cannula oxygen Cardiovascular status: stable and blood pressure returned to baseline Postop Assessment: no apparent nausea or vomiting Anesthetic complications: no    Last Vitals:  Vitals:   11/09/18 1402 11/09/18 1410  BP: (!) 85/69 (!) 91/54  Pulse: 74 83  Resp: 17 11  Temp: 36.6 C   SpO2: 98% 97%    Last Pain:  Vitals:   11/09/18 1410  TempSrc:   PainSc: 0-No pain                 Ninetta Adelstein DAVID

## 2018-11-10 LAB — BPAM RBC
BLOOD PRODUCT EXPIRATION DATE: 201912122359
BLOOD PRODUCT EXPIRATION DATE: 201912152359
Blood Product Expiration Date: 201912152359
Blood Product Expiration Date: 201912152359
ISSUE DATE / TIME: 201911161416
ISSUE DATE / TIME: 201911161813
ISSUE DATE / TIME: 201911152227
ISSUE DATE / TIME: 201911152237
UNIT TYPE AND RH: 5100
UNIT TYPE AND RH: 5100
Unit Type and Rh: 5100
Unit Type and Rh: 5100

## 2018-11-10 LAB — TYPE AND SCREEN
ABO/RH(D): O POS
Antibody Screen: NEGATIVE
UNIT DIVISION: 0
UNIT DIVISION: 0
UNIT DIVISION: 0
Unit division: 0

## 2018-11-11 ENCOUNTER — Other Ambulatory Visit: Payer: Self-pay

## 2018-11-11 ENCOUNTER — Encounter (HOSPITAL_COMMUNITY): Payer: Self-pay | Admitting: Gastroenterology

## 2018-11-16 ENCOUNTER — Ambulatory Visit (INDEPENDENT_AMBULATORY_CARE_PROVIDER_SITE_OTHER): Payer: Self-pay | Admitting: Family Medicine

## 2018-11-16 ENCOUNTER — Encounter: Payer: Self-pay | Admitting: Family Medicine

## 2018-11-16 VITALS — BP 134/88 | HR 110 | Temp 97.8°F | Resp 17 | Ht 64.0 in | Wt 157.6 lb

## 2018-11-16 DIAGNOSIS — R000 Tachycardia, unspecified: Secondary | ICD-10-CM

## 2018-11-16 DIAGNOSIS — Z131 Encounter for screening for diabetes mellitus: Secondary | ICD-10-CM

## 2018-11-16 DIAGNOSIS — D500 Iron deficiency anemia secondary to blood loss (chronic): Secondary | ICD-10-CM

## 2018-11-16 DIAGNOSIS — Z7689 Persons encountering health services in other specified circumstances: Secondary | ICD-10-CM

## 2018-11-16 DIAGNOSIS — Z09 Encounter for follow-up examination after completed treatment for conditions other than malignant neoplasm: Secondary | ICD-10-CM

## 2018-11-16 DIAGNOSIS — D649 Anemia, unspecified: Secondary | ICD-10-CM

## 2018-11-16 LAB — CBC WITH DIFFERENTIAL/PLATELET
BASOS: 0 %
Basophils Absolute: 0 10*3/uL (ref 0.0–0.2)
EOS (ABSOLUTE): 0.1 10*3/uL (ref 0.0–0.4)
EOS: 2 %
HEMATOCRIT: 27.1 % — AB (ref 37.5–51.0)
Hemoglobin: 8.9 g/dL — ABNORMAL LOW (ref 13.0–17.7)
IMMATURE GRANULOCYTES: 0 %
Immature Grans (Abs): 0 10*3/uL (ref 0.0–0.1)
Lymphocytes Absolute: 1.9 10*3/uL (ref 0.7–3.1)
Lymphs: 30 %
MCH: 28.5 pg (ref 26.6–33.0)
MCHC: 32.8 g/dL (ref 31.5–35.7)
MCV: 87 fL (ref 79–97)
MONOS ABS: 0.4 10*3/uL (ref 0.1–0.9)
Monocytes: 6 %
NEUTROS ABS: 3.9 10*3/uL (ref 1.4–7.0)
NEUTROS PCT: 62 %
Platelets: 377 10*3/uL (ref 150–450)
RBC: 3.12 x10E6/uL — ABNORMAL LOW (ref 4.14–5.80)
RDW: 14 % (ref 12.3–15.4)
WBC: 6.2 10*3/uL (ref 3.4–10.8)

## 2018-11-16 MED ORDER — OMEPRAZOLE 40 MG PO CPDR: 40.0000 mg | DELAYED_RELEASE_CAPSULE | Freq: Every day | ORAL | 3 refills | Status: DC

## 2018-11-16 MED ORDER — FERROUS SULFATE 325 (65 FE) MG PO TABS: 325.0000 mg | ORAL_TABLET | Freq: Two times a day (BID) | ORAL | 0 refills | Status: DC

## 2018-11-16 NOTE — Progress Notes (Signed)
Carl Briggs, is a 47 y.o. male  ZOX:[id number]  WJX:[id number]  DOB - 1971-02-04  CC:  Chief Complaint  Patient presents with  . Establish Care  . Hospitalization Follow-up    ED->Hospital 11/16-11/18: GI bleed. had colonoscopy done which showed diverticulosis       HPI: Carl Briggs is a 47 y.o. male is here today to establish care.   [facility] has GI bleed; Acute posthemorrhagic anemia; GI bleeding; and Rectal bleeding on their problem list.   Live Interpreter used to facilitate communication during visit.  Today's visit:  Carl Briggs is here today to establish care. He was recently hospitalized with a complaint of rectal bleeding which had been on-going for 4 days. The bleeding was painless and not associated with any abdominal pain, nausea, vomiting or chills. No prior history of GI problems. He underwent a colonoscopy which revealed diverticulosis as the cause. He developed significant anemia with a hemoglobin low of 6.8. He received a total 2 packed red blood cell transfusions on admission and he was advised to start oral iron supplementation in 1 week follow-up in 1-2 weeks with a CBC. Hemoglobin on discharge 8.5.   Today he reports some occasional right-sided abdominal pain which is non-sharp he associates development of this pain with eating certain foods.  He has not implemented any dietary restrictions which are negative for patients with diverticulosis.  Is also not started the recommended daily iron supplements. He denies weakness. He has seen scant blood with bowel movement 2 days ago, but denies any persistent rectal bleeding. Complains of occasional palpitations. Denies headaches, dizziness, SOB, or chest pain. Not hydrating well with water-he doesn't like water. Recommended follow-up with [facility]. He has not scheduled an appointment as he doesn't have insurance.   Current medications: Current Outpatient Medications:  .  ferrous sulfate 325 (65 FE) MG  tablet, Take 1 tablet (325 mg total) by mouth 2 (two) times daily with a meal. (Patient not taking: Reported on 11/16/2018), Disp: 60 tablet, Rfl: 0   Pertinent family medical history: family history is not on file.   No Known Allergies  Social History   Socioeconomic History  . Marital status: Married    Spouse name: Not on file  . Number of children: Not on file  . Years of education: Not on file  . Highest education level: Not on file  Occupational History  . Not on file  Social Needs  . Financial resource strain: Not on file  . Food insecurity:    Worry: Not on file    Inability: Not on file  . Transportation needs:    Medical: Not on file    Non-medical: Not on file  Tobacco Use  . Smoking status: Never Smoker  . Smokeless tobacco: Never Used  Substance and Sexual Activity  . Alcohol use: No  . Drug use: Not on file  . Sexual activity: Yes  Lifestyle  . Physical activity:    Days per week: Not on file    Minutes per session: Not on file  . Stress: Not on file  Relationships  . Social connections:    Talks on phone: Not on file    Gets together: Not on file    Attends religious service: Not on file    Active member of club or organization: Not on file    Attends meetings of clubs or organizations: Not on file    Relationship status: Not on file  . Intimate partner violence:  Fear of current or ex partner: Not on file    Emotionally abused: Not on file    Physically abused: Not on file    Forced sexual activity: Not on file  Other Topics Concern  . Not on file  Social History Narrative  . Not on file    Review of Systems: Constitutional: Negative for fever, chills, diaphoresis, activity change, appetite change and fatigue. HENT: Negative for ear pain, nosebleeds, congestion, facial swelling, rhinorrhea, neck pain, neck stiffness and ear discharge.  Eyes: Negative for pain, discharge, redness, itching and visual disturbance. Respiratory: Negative for  cough, choking, chest tightness, shortness of breath, wheezing and stridor.  Cardiovascular: Negative for chest pain, palpitations and leg swelling. Gastrointestinal: See HPI Musculoskeletal: Negative for back pain, joint swelling, arthralgia and gait problem. Neurological: Negative for dizziness, tremors, seizures, syncope, facial asymmetry, speech difficulty, weakness, light-headedness, numbness and headaches.  Hematological:See HPI. Psychiatric/Behavioral: Negative for hallucinations, behavioral problems, confusion, dysphoric mood, decreased concentration and agitation.  Objective:   Vitals:   11/16/18 1439  BP: 134/88  Pulse: (!) 110  Resp: 17  Temp: 97.8 F (36.6 C)  SpO2: 97%    BP Readings from Last 3 Encounters:  11/16/18 134/88  11/09/18 100/72  01/14/16 133/90    Filed Weights   11/16/18 1439  Weight: 157 lb 9.6 oz (71.5 kg)      Physical Exam: Constitutional: Patient appears well-developed and well-nourished. No distress. HENT: Normocephalic, atraumatic, External right and left ear normal. Oropharynx is clear and moist.  Eyes: Conjunctivae and EOM are normal. PERRLA, no scleral icterus. Neck: Normal ROM. Neck supple. No JVD. No tracheal deviation. No thyromegaly. CVS: Tachycardia, with +3 apical pulse noted. No murmurs, no gallops, no carotid bruit.  Pulmonary: Effort and breath sounds normal, no stridor, rhonchi, wheezes, rales.  Abdominal: Soft. BS +, no distension, tenderness, rebound or guarding.  Musculoskeletal: Normal range of motion. No edema and no tenderness.  Neuro: Alert. Normal muscle tone coordination. Normal gait. BUE and BLE strength 5/5. Bilateral hand grips symmetrical.No cranial nerve deficit. Skin: Skin is warm and dry. No rash noted. Not diaphoretic. No erythema. No pallor. Psychiatric: Normal mood and affect. Behavior, judgment, thought content normal.  Lab Results (prior encounters)  Lab Results  Component Value Date   WBC 4.2 11/09/2018    HGB 8.5 (L) 11/09/2018   HCT 26.6 (L) 11/09/2018   MCV 89.9 11/09/2018   PLT 213 11/09/2018   Lab Results  Component Value Date   CREATININE 0.79 11/09/2018   BUN 6 11/09/2018   NA 139 11/09/2018   K 4.1 11/09/2018   CL 111 11/09/2018   CO2 23 11/09/2018       Assessment and plan:  1. Encounter to establish care 2. Hospital discharge follow-up 3. Iron deficiency anemia due to chronic blood loss Patient has had at least one episode of bleeding since discharge from the hospital. I will check a iron panel along with a CBC with differential today stat given patient has some tachycardia on exam today.  EKG performed which showed normal heart rhythm with a BPM. - Iron, TIBC and Ferritin Panel - CBC with Differential  4. Screening for diabetes mellitus - Basic metabolic panel - Hemoglobin A1c  5. Very rapid heartbeat, EKG 12-Lead, BPM 92 Sinus Rhythm  6. Low hemoglobin, repeat CBC  Return in about 4 weeks (around 12/14/2018) for GI bleeding .   A total of 40 minutes spent, greater than 50 % of this time was spent use  of language interpreter due to language barrier, review of medical records and diagnostic testing, counseling and coordination of care.   The patient was given clear instructions to go to ER or return to medical center if symptoms don't improve, worsen or new problems develop. The patient verbalized understanding. The patient was advised  to call and obtain lab results if they haven't heard anything from out office within 7-10 business days.  Joaquin Courts, FNP-C [facility] [address], [place] [address] [phone]: [phone]    This note has been created with Dragon speech recognition software and Paediatric nurse. Any transcriptional errors are unintentional.

## 2018-11-16 NOTE — Patient Instructions (Addendum)
[facility]    [facility] Headingere[facility]ane[facility] was seen by Joaquin CourtsKimberly Harris, FNP today.   [place] Qu[place] Perez-Yanez's primary care provider is Bing NeighborsHarris, Kimberly S, FNP.   For the best care possible, you should try to see Joaquin CourtsKimberly Harris, FNP-C whenever you come to the clinic.   We look forward to seeing you again soon!  If you have any questions about your visit today, please call us at [phone] or feel free to reach your primary care provider via MyChart.     Karl PockGracias por elegir [facility] [facility] hogar mdico!    [place] Luis Prez-Yanez fue visto por Joaquin CourtsKimberly Harris, FNP hoy.   El proveedor de atencin primaria de [place] Prez[place] es Harris, [place] Village[place]berly S, [place].   Para obtener la mejor atencin posible, usted debe tratar de ver a Joaquin CourtsKimberly Harris, FNP-C cada vez que venga a la clnica.   Esperamos volver a verte pronto!  Si tiene The Mutual of Omahaalguna pregunta sobre su visita de hoy, llmenos al [phone] o no dude en comunicarse con su proveedor de atencin primaria a travs de MyChart.

## 2018-11-17 ENCOUNTER — Telehealth: Payer: Self-pay | Admitting: Family Medicine

## 2018-11-17 LAB — BASIC METABOLIC PANEL
BUN / CREAT RATIO: 25 — AB (ref 9–20)
BUN: 16 mg/dL (ref 6–24)
CHLORIDE: 108 mmol/L — AB (ref 96–106)
CO2: 22 mmol/L (ref 20–29)
Calcium: 8.6 mg/dL — ABNORMAL LOW (ref 8.7–10.2)
Creatinine, Ser: 0.63 mg/dL — ABNORMAL LOW (ref 0.76–1.27)
GFR, EST AFRICAN AMERICAN: 135 mL/min/{1.73_m2} (ref >59)
GFR, EST NON AFRICAN AMERICAN: 117 mL/min/{1.73_m2} (ref >59)
Glucose: 89 mg/dL (ref 65–99)
Potassium: 4.3 mmol/L (ref 3.5–5.2)
SODIUM: 143 mmol/L (ref 134–144)

## 2018-11-17 LAB — CBC WITH DIFFERENTIAL/PLATELET
BASOS ABS: 0 10*3/uL (ref 0.0–0.2)
Basos: 1 %
EOS (ABSOLUTE): 0.1 10*3/uL (ref 0.0–0.4)
EOS: 2 %
HEMOGLOBIN: 9 g/dL — AB (ref 13.0–17.7)
Hematocrit: 27.6 % — ABNORMAL LOW (ref 37.5–51.0)
IMMATURE GRANULOCYTES: 0 %
Immature Grans (Abs): 0 10*3/uL (ref 0.0–0.1)
LYMPHS ABS: 1.9 10*3/uL (ref 0.7–3.1)
Lymphs: 29 %
MCH: 28.4 pg (ref 26.6–33.0)
MCHC: 32.6 g/dL (ref 31.5–35.7)
MCV: 87 fL (ref 79–97)
MONOCYTES: 6 %
Monocytes Absolute: 0.4 10*3/uL (ref 0.1–0.9)
NEUTROS PCT: 62 %
Neutrophils Absolute: 4 10*3/uL (ref 1.4–7.0)
Platelets: 374 10*3/uL (ref 150–450)
RBC: 3.17 x10E6/uL — AB (ref 4.14–5.80)
RDW: 13.8 % (ref 12.3–15.4)
WBC: 6.4 10*3/uL (ref 3.4–10.8)

## 2018-11-17 LAB — IRON,TIBC AND FERRITIN PANEL
FERRITIN: 8 ng/mL — AB (ref 30–400)
IRON SATURATION: 3 % — AB (ref 15–55)
IRON: 14 ug/dL — AB (ref 38–169)
TIBC: 414 ug/dL (ref 250–450)
UIBC: 400 ug/dL — ABNORMAL HIGH (ref 111–343)

## 2018-11-17 LAB — HEMOGLOBIN A1C
Est. average glucose Bld gHb Est-mCnc: 108 mg/dL
Hgb A1c MFr Bld: 5.4 % (ref 4.8–5.6)

## 2018-11-17 NOTE — Telephone Encounter (Signed)
Patient's infusion appointment moved to 8:30 AM on 11/18/18. Patient aware of new appointment time & states that this works for him. Gave him address to patient care center.

## 2018-11-17 NOTE — Telephone Encounter (Signed)
Patient is scheduled at 1:00 pm for an iron infusion at the patient care center. Please contact patient with an interpreter to advise of appointment and location.

## 2018-11-17 NOTE — Telephone Encounter (Addendum)
Called patient using interpreter line. He states that he is currently working & is unable to make his appointment in the afternoon. He asked if I could call the office to see if they have any appointments in the morning.

## 2018-11-18 ENCOUNTER — Ambulatory Visit (HOSPITAL_COMMUNITY)
Admission: RE | Admit: 2018-11-18 | Discharge: 2018-11-18 | Disposition: A | Discharge status: Home / Self Care | Payer: Self-pay | Source: Ambulatory Visit | Attending: Family Medicine | Admitting: Family Medicine

## 2018-11-18 DIAGNOSIS — D649 Anemia, unspecified: Secondary | ICD-10-CM | POA: Insufficient documentation

## 2018-11-18 DIAGNOSIS — D500 Iron deficiency anemia secondary to blood loss (chronic): Secondary | ICD-10-CM | POA: Insufficient documentation

## 2018-11-18 LAB — CBC WITH DIFFERENTIAL/PLATELET
Abs Immature Granulocytes: 0.02 10*3/uL (ref 0.00–0.07)
Basophils Absolute: 0 10*3/uL (ref 0.0–0.1)
Basophils Relative: 0 %
EOS ABS: 0.1 10*3/uL (ref 0.0–0.5)
Eosinophils Relative: 1 %
HEMATOCRIT: 28.9 % — AB (ref 39.0–52.0)
Hemoglobin: 9.1 g/dL — ABNORMAL LOW (ref 13.0–17.0)
Immature Granulocytes: 0 %
LYMPHS ABS: 1.7 10*3/uL (ref 0.7–4.0)
Lymphocytes Relative: 26 %
MCH: 28.9 pg (ref 26.0–34.0)
MCHC: 31.5 g/dL (ref 30.0–36.0)
MCV: 91.7 fL (ref 80.0–100.0)
MONO ABS: 0.4 10*3/uL (ref 0.1–1.0)
MONOS PCT: 6 %
NEUTROS PCT: 67 %
Neutro Abs: 4.2 10*3/uL (ref 1.7–7.7)
Platelets: 365 10*3/uL (ref 150–400)
RBC: 3.15 MIL/uL — ABNORMAL LOW (ref 4.22–5.81)
RDW: 13.5 % (ref 11.5–15.5)
WBC: 6.4 10*3/uL (ref 4.0–10.5)
nRBC: 0 % (ref 0.0–0.2)

## 2018-11-18 MED ORDER — SODIUM CHLORIDE 0.9 % IV SOLN
Freq: Once | INTRAVENOUS | Status: AC
  Administered 2018-11-18: 11:00:00 via INTRAVENOUS

## 2018-11-18 MED ORDER — SODIUM CHLORIDE 0.9 % IV SOLN
INTRAVENOUS | Status: DC | PRN
  Administered 2018-11-18: 250 mL via INTRAVENOUS

## 2018-11-18 MED ORDER — SODIUM CHLORIDE 0.9 % IV SOLN
510.0000 mg | Freq: Once | INTRAVENOUS | Status: AC
  Administered 2018-11-18: 510 mg via INTRAVENOUS
  Filled 2018-11-18: qty 17

## 2018-11-18 NOTE — Discharge Instructions (Signed)
Ferumoxytol injection Qu es este medicamento? El FERUMOXYTOL es un complejo de hierro. El hierro se utiliza para la produccin de glbulos rojos sanos, los cuales transportan el oxgeno y los nutrientes hacia todo el cuerpo. Este medicamento se utiliza para tratar la anemia por falta de hierro a las personas con enfermedad renal crnica. Este medicamento puede ser utilizado para otros usos; si tiene alguna pregunta consulte con su proveedor de atencin mdica o con su farmacutico. MARCAS COMUNES: Feraheme Qu le debo informar a mi profesional de la salud antes de tomar este medicamento? Necesita saber si usted presenta alguno de los siguientes problemas o situaciones: -anemia no provocada por niveles bajos de hierro -niveles altos de hierro en la sangre -examen de imgenes por resonancia magntica (IRM) programado -una reaccin alrgica o inusual al hierro, otros medicamentos, alimentos, colorantes o conservantes -si est embarazada o buscando quedar embarazada -si est amamantando a un beb Cmo debo utilizar este medicamento? Este medicamento se administra mediante inyeccin por va intravenosa. Lo administra un profesional de la salud en un hospital o en un entorno clnico. Hable con su pediatra para informarse acerca del uso de este medicamento en nios. Puede requerir atencin especial. Sobredosis: Pngase en contacto inmediatamente con un centro toxicolgico o una sala de urgencia si usted cree que haya tomado demasiado medicamento. ATENCIN: Este medicamento es solo para usted. No comparta este medicamento con nadie. Qu sucede si me olvido de una dosis? Es importante no olvidar ninguna dosis. Informe a su mdico o a su profesional de la salud si no puede asistir a una cita. Qu puede interactuar con este medicamento? Esta medicina puede interactuar con los siguientes medicamentos: -otros productos de hierro Puede ser que esta lista no menciona todas las posibles interacciones.  Informe a su profesional de la salud de todos los productos a base de hierbas, medicamentos de venta libre o suplementos nutritivos que est tomando. Si usted fuma, consume bebidas alcohlicas o si utiliza drogas ilegales, indqueselo tambin a su profesional de la salud. Algunas sustancias pueden interactuar con su medicamento. A qu debo estar atento al usar este medicamento? Visite a su mdico o a su profesional de la salud de manera regular. Si los sntomas no comienzan a mejorar o si empeoran, consulte con su mdico o con su profesional de la salud. Tal vez necesita realizarse anlisis de sangre mientras recibe este medicamento. Tal vez necesita seguir una dieta especial. Consulte a su mdico. Los alimentos que contienen hierro incluyen: alimentos integrales o con cereales, frutas secas, frijoles o arvejas, vegetales de hoja verde y carne que proviene de rganos (hgado, rin). Qu efectos secundarios puedo tener al utilizar este medicamento? Efectos secundarios que debe informar a su mdico o a su profesional de la salud tan pronto como sea posible: reacciones alrgicas, como erupcin cutnea, picazn o urticarias, e hinchazn de la cara, los labios o la lengua problemas respiratorios cambios en la presin sangunea sensacin de desmayos o aturdimiento, cadas fiebre o escalofros enrojecimiento, sudoracin o sensacin de calor hinchazn de tobillos o pies Efectos secundarios que generalmente no requieren atencin mdica (infrmelos a su mdico o a su profesional de la salud si persisten o si son molestos): diarrea dolor de cabeza nuseas, vmito dolor estomacal Puede ser que esta lista no menciona todos los posibles efectos secundarios. Comunquese a su mdico por asesoramiento mdico sobre los efectos secundarios. Usted puede informar los efectos secundarios a la FDA por telfono al 1-800-FDA-1088. Dnde debo guardar mi medicina? Este medicamento se administra en hospitales   o clnicas y no  necesitar guardarlo en su domicilio. ATENCIN: Este folleto es un resumen. Puede ser que no cubra toda la posible informacin. Si usted tiene preguntas acerca de esta medicina, consulte con su mdico, su farmacutico o su profesional de la salud.  2018 Elsevier/Gold Standard (2016-03-25 00:00:00)  

## 2018-11-18 NOTE — Progress Notes (Signed)
Soon after an IV was established, patient complained of blurry vision and chest pain. An interpreter was consulted. Nurse notified the provider about the complaints and patient's vital signs. Lab draw for CBC was obtained and 500 cc bolus of 0.9% normal saline was ordered. Thereafter, patient said he felt fine. [name] was then administered, patient was observed for 30 minutes post infusion. Vitals were taken, remained stable.Thereafter, the ordered Bolus was administered. Patients vitals were again taken, they remained stable . Patient tolerated well, discharge instructions printed in Spanish and given, verbalized understanding. Patient alert, oriented and ambulatory at the time of discharge. Left with his wife.

## 2018-12-14 ENCOUNTER — Ambulatory Visit (INDEPENDENT_AMBULATORY_CARE_PROVIDER_SITE_OTHER): Payer: Self-pay | Admitting: Family Medicine

## 2018-12-14 ENCOUNTER — Encounter: Payer: Self-pay | Admitting: Family Medicine

## 2018-12-14 VITALS — BP 146/91 | HR 88 | Resp 17 | Ht 64.0 in | Wt 163.9 lb

## 2018-12-14 DIAGNOSIS — K625 Hemorrhage of anus and rectum: Secondary | ICD-10-CM

## 2018-12-14 DIAGNOSIS — D500 Iron deficiency anemia secondary to blood loss (chronic): Secondary | ICD-10-CM

## 2018-12-14 MED ORDER — FERROUS SULFATE 325 (65 FE) MG PO TABS: 325.0000 mg | ORAL_TABLET | Freq: Two times a day (BID) | ORAL | 0 refills | Status: DC

## 2018-12-14 MED ORDER — FERROUS SULFATE 325 (65 FE) MG PO TABS: 325.0000 mg | ORAL_TABLET | Freq: Two times a day (BID) | ORAL | 5 refills | Status: DC

## 2018-12-14 NOTE — Progress Notes (Signed)
Established Patient Office Visit  Subjective:  Patient ID: Carl Briggs, male    DOB: [birth year]  Age: 47 y.o. MRN: [id number]  CC:  Chief Complaint  Patient presents with  . Follow-up    f/up GI bleed    HPI [facility] for evaluation of anemia related to rectal blood loss. Patient was seen in office  On 11/16/2018 for a hospital follow-up s/p admission for rectal bleeding. During hospitalizations he was found to have sigmoid diverticulosis without diverticulitis. During his prior visit, CBC and iron panel were check and he was found to have a low iron level. He received an IV iron infusion and his here today for follow-up. He denies rotations, fatigue, rectal bleeding, tarry stools, nausea, vomiting, or sharp abdominal pain.  Lack of health insurance he has been unable to follow-up with a gastroenterologist however has the financial packet today to complete.  Ports overall improvement of fatigue is fine.  He occasionally has some sharp bilateral abdominal pain he is continuing to take omeprazole and iron supplements daily.  He denies any other complaints or concerns.    No past medical history on file.  Past Surgical History:  Procedure Laterality Date  . COLONOSCOPY WITH PROPOFOL N/A 11/09/2018   Procedure: COLONOSCOPY WITH PROPOFOL;  Surgeon: Willis Modenautlaw, William, MD;  [facility];  Service: Endoscopy;  Laterality: N/A;    No family history on file.  Social History   Socioeconomic History  . Marital status: Married    Spouse name: Not on file  . Number of children: Not on file  . Years of education: Not on file  . Highest education level: Not on file  Occupational History  . Not on file  Social Needs  . Financial resource strain: Not on file  . Food insecurity:    Worry: Not on file    Inability: Not on file  . Transportation needs:    Medical: Not on file    Non-medical: Not on file  Tobacco Use  . Smoking status: Never Smoker  .  Smokeless tobacco: Never Used  Substance and Sexual Activity  . Alcohol use: No  . Drug use: Not on file  . Sexual activity: Yes  Lifestyle  . Physical activity:    Days per week: Not on file    Minutes per session: Not on file  . Stress: Not on file  Relationships  . Social connections:    Talks on phone: Not on file    Gets together: Not on file    Attends religious service: Not on file    Active member of club or organization: Not on file    Attends meetings of clubs or organizations: Not on file    Relationship status: Not on file  . Intimate partner violence:    Fear of current or ex partner: Not on file    Emotionally abused: Not on file    Physically abused: Not on file    Forced sexual activity: Not on file  Other Topics Concern  . Not on file  Social History Narrative  . Not on file    Outpatient Medications Prior to Visit  Medication Sig Dispense Refill  . ferrous sulfate 325 (65 FE) MG tablet Take 1 tablet (325 mg total) by mouth 2 (two) times daily with a meal. 60 tablet 0  . omeprazole (PRILOSEC) 40 MG capsule Take 1 capsule (40 mg total) by mouth daily. 30 capsule 3   No facility-administered medications prior  to visit.     No Known Allergies  ROS Review of Systems  Pertinent negatives listed in HPI   Objective:    Physical Exam  BP (!) 146/91   Pulse 88   Resp 17   Ht 5\' 4"  (1.626 m)   Wt 163 lb 14.4 oz (74.3 kg)   SpO2 96%   BMI 28.13 kg/m  Wt Readings from Last 3 Encounters:  12/14/18 163 lb 14.4 oz (74.3 kg)  11/16/18 157 lb 9.6 oz (71.5 kg)  11/07/18 160 lb (72.6 kg)    Physical Exam: Constitutional: Patient appears well-developed and well-nourished. No distress. CVS: RRR, S1/S2 +, no murmurs, no gallops, no carotid bruit.  Pulmonary: Effort and breath sounds normal, no stridor, rhonchi, wheezes, rales.  Abdominal: Soft. BS +, no distension, tenderness, rebound or guarding.  Musculoskeletal: Normal range of motion. No edema and no  tenderness.  Neuro: Alert. Normal reflexes, muscle tone coordination. No cranial nerve deficit. Skin: Skin is warm and dry. No rash noted. Not diaphoretic. No erythema. No pallor. Psychiatric: Normal mood and affect. Behavior, judgment, thought content normal.   Health Maintenance Due  Topic Date Due  . TETANUS/TDAP  03/06/1989  . INFLUENZA VACCINE  07/23/2018    There are no preventive care reminders to display for this patient.  No results found for: TSH Lab Results  Component Value Date   WBC 6.4 11/18/2018   HGB 9.1 (L) 11/18/2018   HCT 28.9 (L) 11/18/2018   MCV 91.7 11/18/2018   PLT 365 11/18/2018   Lab Results  Component Value Date   NA 143 11/16/2018   K 4.3 11/16/2018   CO2 22 11/16/2018   GLUCOSE 89 11/16/2018   BUN 16 11/16/2018   CREATININE 0.63 (L) 11/16/2018   BILITOT 0.4 11/07/2018   ALKPHOS 63 11/07/2018   AST 20 11/07/2018   ALT 18 11/07/2018   PROT 5.8 (L) 11/07/2018   ALBUMIN 3.3 (L) 11/07/2018   CALCIUM 8.6 (L) 11/16/2018   ANIONGAP 5 11/09/2018   No results found for: CHOL No results found for: HDL No results found for: LDLCALC No results found for: TRIG No results found for: CHOLHDL Lab Results  Component Value Date   HGBA1C 5.4 11/16/2018      Assessment & Plan:   Problem List Items Addressed This Visit      Digestive   Rectal bleeding - Primary   Relevant Orders   CBC with Differential   Comprehensive metabolic panel   Iron, TIBC and Ferritin Panel    Other Visit Diagnoses    Iron deficiency anemia due to chronic blood loss       Relevant Medications   ferrous sulfate 325 (65 FE) MG tablet   Other Relevant Orders   CBC with Differential   Iron, TIBC and Ferritin Panel      Continue iron replacement for now. If iron is critically low, will consider another iron infusion or increasing frequency of oral iron replacement. Patient is instructed to complete financial application as he will need follow-up with gastroenterology.  BP elevated today and during prior visits which patient attributes to nervousness. Advised to monitor and gave parameters in spanish of range a normal BP should be. He is advised to follow-up here if readings are consistently greater than 140/90.  Meds ordered this encounter  Medications  . DISCONTD: ferrous sulfate 325 (65 FE) MG tablet    Sig: Take 1 tablet (325 mg total) by mouth 2 (two) times daily with a  meal.    Dispense:  60 tablet    Refill:  0  . ferrous sulfate 325 (65 FE) MG tablet    Sig: Take 1 tablet (325 mg total) by mouth 2 (two) times daily with a meal.    Dispense:  60 tablet    Refill:  5    Follow-up: Follow-up by phone once financial application is completed and approved for a referral to be processed to gastroenterology.    Joaquin CourtsKimberly Clayton Jarmon, FNP [facility] [address], [place] [id number] [phone]: [phone]

## 2018-12-14 NOTE — Patient Instructions (Signed)
Please notify me here at the office once your financial assistance has been approved for referral to gastroenterologist for evaluation of prior rectal bleeding.      Cmo tomarse la presin arterial How to Take Your Blood Pressure Usted puede tomar su presin arterial en casa con un aparato. Es posible que tenga que tomar su presin arterial en casa:  Para ver si tiene presin arterial elevada (hipertensin).  Para controlar su presin arterial a lo largo del tiempo.  Para asegurarse de que el medicamento para la presin arterial est funcionando. Materiales necesarios: Pension scheme managerecesitar un aparato de medicin de la presin arterial o tensimetro. Puede comprar uno en una farmacia o en lnea. Al escoger uno:  Escoja uno que tenga un brazalete.  Escoja uno que se envuelva en la parte superior de su brazo. Debe poder meter nicamente un dedo entre el brazalete y Cabin crewel brazo.  No escoja uno que mida su presin arterial en la mueca o el dedo. Su mdico puede sugerirle un tensimetro. Cmo prepararse Evite realizar lo siguiente durante los 30 minutos anteriores a Chief Operating Officercontrolar su presin arterial:  Pharmacist, hospitalBeber cafena.  Consumir alcohol.  Comer.  Fumar.  Realizar actividad fsica. Cinco minutos antes de controlar su presin arterial:  Orine.  Sintese en una silla de comedor. Evite sentarse en un silln blando o sof.  Est tranquilo. No hable. Cmo tomarse la presin arterial Siga las instrucciones que vienen con el aparato. Si tiene Ambulance personun tensimetro digital, las instrucciones podran ser las siguientes: 1. Sintese con la espalda recta. 2. Coloque los pies en el piso. No cruce los tobillos o las piernas. 3. Apoye el brazo izquierdo al nivel del corazn. Puede apoyarlo en una mesa, escritorio o silla. 4. Arremnguese. 5. Envuelva la parte superior de su brazo izquierdo con el brazalete. El brazalete debe estar 1 pulgada (2,5 cm) sobre su codo. Es mejor Optometristenvolver el brazalete alrededor de la  piel Bremertondesnuda. 6. Ajuste el brazalete alrededor de su brazo. Debe poder meter nicamente un dedo entre el brazalete y Cabin crewel brazo. 7. Coloque el cable dentro del surco de su codo. 8. Presione el botn de encendido. 9. Qudese sentado tranquilamente mientras el brazalete se infla y se desinfla. 10. Escriba los nmeros que se muestran en la pantalla. 11. Espere 2o 3 minutos y repita los pasos 1al10. Qu significan los nmeros? Dos nmeros conforman la presin arterial. El primer nmero es la presin sistlica. El segundo nmero es la presin diastlica. Un ejemplo de lectura de presin arterial sera "120 sobre 80" (o 120/80). Si es adulto y no tiene Therapist, musicninguna afeccin, use esta gua para saber si su presin arterial es normal: Normal  Primer nmero: debajo de 120.  Segundo nmero: debajo de 80. Elevada  Primer nmero: 120-129.  Segundo nmero: debajo de 80. Etapa 1 de hipertensin  Primer nmero: 130-139.  Segundo nmero: 80-89. Etapa 2 de hipertensin  Primer nmero: 140 o ms.  Segundo nmero: 90 o ms. Su presin arterial se encuentra por encima del nivel normal incluso si nicamente el nmero superior o el inferior es mayor que el normal. Siga estas instrucciones en su casa:  Controle su presin arterial con la frecuencia que le indique su mdico.  Lleve el tensimetro a su prxima cita con el mdico. Su mdico: ? Se asegurar de que lo est usando correctamente. ? Se asegurar de que funcione bien.  Se asegurar de que entienda cules deben ser sus nmeros de presin arterial.  Dgale al mdico si sus medicamentos le  causan efectos secundarios. Comunquese con un mdico si:  Su presin arterial sigue alta. Solicite ayuda de inmediato si:  Su primer nmero de presin arterial es ms alto que 180.  Su segundo nmero de presin arterial es ms alto que 120. Esta informacin no tiene Theme park managercomo fin reemplazar el consejo del mdico. Asegrese de hacerle al mdico cualquier  pregunta que tenga. Document Released: 03/26/2011 Document Revised: 11/20/2016 Document Reviewed: 05/17/2016 Elsevier Interactive Patient Education  2019 [employer]

## 2018-12-15 LAB — COMPREHENSIVE METABOLIC PANEL
A/G RATIO: 1.3 (ref 1.2–2.2)
ALBUMIN: 4.1 g/dL (ref 3.5–5.5)
ALT: 24 IU/L (ref 0–44)
AST: 17 IU/L (ref 0–40)
Alkaline Phosphatase: 113 IU/L (ref 39–117)
BILIRUBIN TOTAL: 0.4 mg/dL (ref 0.0–1.2)
BUN / CREAT RATIO: 16 (ref 9–20)
BUN: 14 mg/dL (ref 6–24)
CALCIUM: 9 mg/dL (ref 8.7–10.2)
CHLORIDE: 103 mmol/L (ref 96–106)
CO2: 23 mmol/L (ref 20–29)
Creatinine, Ser: 0.87 mg/dL (ref 0.76–1.27)
GFR, EST AFRICAN AMERICAN: 118 mL/min/{1.73_m2} (ref >59)
GFR, EST NON AFRICAN AMERICAN: 102 mL/min/{1.73_m2} (ref >59)
Globulin, Total: 3.2 g/dL (ref 1.5–4.5)
Glucose: 90 mg/dL (ref 65–99)
Potassium: 4.7 mmol/L (ref 3.5–5.2)
Sodium: 141 mmol/L (ref 134–144)
TOTAL PROTEIN: 7.3 g/dL (ref 6.0–8.5)

## 2018-12-15 LAB — CBC WITH DIFFERENTIAL/PLATELET
BASOS: 0 %
Basophils Absolute: 0 10*3/uL (ref 0.0–0.2)
EOS (ABSOLUTE): 0.1 10*3/uL (ref 0.0–0.4)
Eos: 2 %
HEMOGLOBIN: 14.2 g/dL (ref 13.0–17.7)
Hematocrit: 42.5 % (ref 37.5–51.0)
IMMATURE GRANS (ABS): 0 10*3/uL (ref 0.0–0.1)
Immature Granulocytes: 0 %
LYMPHS ABS: 1.6 10*3/uL (ref 0.7–3.1)
LYMPHS: 29 %
MCH: 29.9 pg (ref 26.6–33.0)
MCHC: 33.4 g/dL (ref 31.5–35.7)
MCV: 90 fL (ref 79–97)
MONOCYTES: 9 %
Monocytes Absolute: 0.5 10*3/uL (ref 0.1–0.9)
NEUTROS ABS: 3.3 10*3/uL (ref 1.4–7.0)
Neutrophils: 60 %
Platelets: 239 10*3/uL (ref 150–450)
RBC: 4.75 x10E6/uL (ref 4.14–5.80)
RDW: 15.1 % (ref 12.3–15.4)
WBC: 5.4 10*3/uL (ref 3.4–10.8)

## 2018-12-15 LAB — IRON,TIBC AND FERRITIN PANEL
FERRITIN: 62 ng/mL (ref 30–400)
Iron Saturation: 29 % (ref 15–55)
Iron: 98 ug/dL (ref 38–169)
TIBC: 336 ug/dL (ref 250–450)
UIBC: 238 ug/dL (ref 111–343)

## 2018-12-18 ENCOUNTER — Encounter: Payer: Self-pay | Admitting: Family Medicine

## 2020-04-14 ENCOUNTER — Emergency Department (HOSPITAL_COMMUNITY): Payer: Self-pay

## 2020-04-14 ENCOUNTER — Other Ambulatory Visit: Payer: Self-pay

## 2020-04-14 ENCOUNTER — Emergency Department (HOSPITAL_COMMUNITY)
Admission: EM | Admit: 2020-04-14 | Discharge: 2020-04-14 | Disposition: A | Discharge status: Home / Self Care | Payer: Self-pay | Attending: Emergency Medicine | Admitting: Emergency Medicine

## 2020-04-14 DIAGNOSIS — M25511 Pain in right shoulder: Secondary | ICD-10-CM | POA: Insufficient documentation

## 2020-04-14 MED ORDER — MELOXICAM 7.5 MG PO TABS: 15.0000 mg | ORAL_TABLET | Freq: Every day | ORAL | 0 refills | Status: DC

## 2020-04-14 MED ORDER — KETOROLAC TROMETHAMINE 60 MG/2ML IM SOLN
60.0000 mg | Freq: Once | INTRAMUSCULAR | Status: AC
  Administered 2020-04-14: 23:00:00 60 mg via INTRAMUSCULAR
  Filled 2020-04-14: qty 2

## 2020-04-14 NOTE — ED Triage Notes (Signed)
Pt c/o left shoulder pain that began today that worsened as the day progressed and states he cannot lift his arm at all now. Pt has been lifting heavy wood all day.

## 2020-04-14 NOTE — Progress Notes (Signed)
Orthopedic Tech Progress Note Patient Details:  Keenen Roessner [facility] Dec 13, 1971 [id number]  Ortho Devices Type of Ortho Device: Sling immobilizer Ortho Device/Splint Location: RUE Ortho Device/Splint Interventions: Application   Post Interventions Patient Tolerated: Well Instructions Provided: Care of device, Adjustment of device   Briannon Boggio E China Deitrick 04/14/2020, 10:21 PM

## 2020-04-14 NOTE — Discharge Instructions (Addendum)
Take the prescribed medication as directed. Follow-up with orthopedics if pain worsening or not improving in the next few days-- call for appt. Return to the ED for new or worsening symptoms.

## 2020-04-14 NOTE — ED Notes (Signed)
Patient verbalizes understanding of discharge instructions. Opportunity for questioning and answers were provided. Armband removed by staff, pt discharged from ED and ambulated to lobby to wait for family. 

## 2020-04-14 NOTE — ED Provider Notes (Signed)
[facility] EMERGENCY DEPARTMENT Provider Note   CSN: [id number] Arrival date & time: 04/14/20  1923     History Chief Complaint  Patient presents with  . Shoulder Pain    [place] is a 49 y.o. male.  The history is provided by the patient and medical records.   49 y.o. male presenting to the ED with right shoulder pain.  States he was chopping wood all day today and now can barely lift his right arm.  He is right-hand dominant.  States towards the end of the day he started noticing pain so started carrying the crate on his left shoulder instead.  Pain has worsened since.  He points to his [facility] joint as site of pain.  Does endorse some mild radiation of pain into the upper arm.  No numbness/weakness.  No meds PTA.  No past medical history on file.  Patient Active Problem List   Diagnosis Date Noted  . Rectal bleeding   . GI bleed 11/07/2018  . Acute posthemorrhagic anemia 11/07/2018  . GI bleeding 11/07/2018    Past Surgical History:  Procedure Laterality Date  . COLONOSCOPY WITH PROPOFOL N/A 11/09/2018   Procedure: COLONOSCOPY WITH PROPOFOL;  Surgeon: Arta Silence, MD;  Location: [facility];  Service: Endoscopy;  Laterality: N/A;       No family history on file.  Social History   Tobacco Use  . Smoking status: Never Smoker  . Smokeless tobacco: Never Used  Substance Use Topics  . Alcohol use: No  . Drug use: Not on file    Home Medications Prior to Admission medications   Medication Sig Start Date End Date Taking? Authorizing Provider  ferrous sulfate 325 (65 FE) MG tablet Take 1 tablet (325 mg total) by mouth 2 (two) times daily with a meal. 12/14/18   Scot Jun, FNP  omeprazole (PRILOSEC) 40 MG capsule Take 1 capsule (40 mg total) by mouth daily. 11/16/18   Scot Jun, FNP    Allergies    Patient has no known allergies.  Review of Systems   Review of Systems  Musculoskeletal: Positive for arthralgias.   All other systems reviewed and are negative.   Physical Exam Updated Vital Signs BP (!) 158/100 (BP Location: Left Arm)   Pulse 78   Temp 97.9 F (36.6 C) (Oral)   Resp 18   SpO2 98%   Physical Exam Vitals and nursing note reviewed.  Constitutional:      Appearance: He is well-developed.  HENT:     Head: Normocephalic and atraumatic.  Eyes:     Conjunctiva/sclera: Conjunctivae normal.     Pupils: Pupils are equal, round, and reactive to light.  Cardiovascular:     Rate and Rhythm: Normal rate and regular rhythm.     Heart sounds: Normal heart sounds.  Pulmonary:     Effort: Pulmonary effort is normal.     Breath sounds: Normal breath sounds. No stridor. No wheezing.  Abdominal:     General: Bowel sounds are normal.     Palpations: Abdomen is soft.     Tenderness: There is no guarding.     Hernia: No hernia is present.  Musculoskeletal:        General: Normal range of motion.     Cervical back: Normal range of motion.     Comments: Focal tenderness over the right AC joint, there is no noted deformity or signs of separation, able to flex and extend at the elbow  and wrist, pain elicited with any attempted range of motion of right shoulder, normal grip strength, normal distal sensation and perfusion  Skin:    General: Skin is warm and dry.  Neurological:     Mental Status: He is alert and oriented to person, place, and time.     ED Results / Procedures / Treatments   Labs (all labs ordered are listed, but only abnormal results are displayed) Labs Reviewed - No data to display  EKG None  Radiology DG Shoulder Right  Result Date: 04/14/2020 CLINICAL DATA:  Lifting heavy would with progressive shoulder pain extending from the superior border of the right shoulder radiating to the neck and down into the mid humerus, unable to elevate arm EXAM: RIGHT SHOULDER - 2+ VIEW COMPARISON:  None. FINDINGS: No acute bony abnormality. Specifically, no fracture, subluxation, or  dislocation. Mild acromioclavicular arthrosis with some undersurface spurring. Such finding could predispose the patient to subacromial impingement. No significant glenohumeral arthrosis. Remaining osseous structures are unremarkable. No acute abnormality of the included portion of the right chest wall. IMPRESSION: 1. No acute abnormality. 2. Mild acromioclavicular arthrosis with undersurface spurring. Electronically Signed   By: Kreg Shropshire M.D.   On: 04/14/2020 20:26    Procedures Procedures (including critical care time)  Medications Ordered in ED Medications  ketorolac (TORADOL) injection 60 mg (has no administration in time range)    ED Course  I have reviewed the triage vital signs and the nursing notes.  Pertinent labs & imaging results that were available during my care of the patient were reviewed by me and considered in my medical decision making (see chart for details).    MDM Rules/Calculators/A&P  49 year old male here with right shoulder pain.  He has been chopping wood all day, now feels like he cannot lift his arm.  He is afebrile and nontoxic in appearance.  On my exam he has focal tenderness over the [facility] joint without any noted deformity or signs of separation.  Pain is elicited with any attempted range of motion.  He maintains normal range of motion of the right elbow, wrist, and fingers.  Normal grip strength and distal sensation/perfusion.  X-ray was obtained, acromioclavicular arthrosis with undersurface spurring.  This along with overuse likely cause of his symptoms.  He was given toradol here along with shoulder sling.  Will start on course of meloxicam and refer to orthopedics for any ongoing issues.  Encouraged to limit use for the next few days.  He may return here for any new/acute changes.  Final Clinical Impression(s) / ED Diagnoses Final diagnoses:  Acute pain of right shoulder    Rx / DC Orders ED Discharge Orders         Ordered    meloxicam (MOBIC) 7.5  MG tablet  Daily     04/14/20 2251           Garlon Hatchet, PA-C 04/14/20 2301    Melene Plan, DO 04/14/20 2315

## 2020-07-10 ENCOUNTER — Other Ambulatory Visit: Payer: Self-pay | Admitting: Nurse Practitioner

## 2020-07-10 DIAGNOSIS — R1011 Right upper quadrant pain: Secondary | ICD-10-CM

## 2020-07-24 ENCOUNTER — Ambulatory Visit
Admission: RE | Admit: 2020-07-24 | Discharge: 2020-07-24 | Disposition: A | Discharge status: Home / Self Care | Payer: No Typology Code available for payment source | Source: Ambulatory Visit | Attending: Nurse Practitioner | Admitting: Nurse Practitioner

## 2020-07-24 DIAGNOSIS — R1011 Right upper quadrant pain: Secondary | ICD-10-CM

## 2020-07-26 ENCOUNTER — Encounter: Payer: Self-pay | Admitting: Physician Assistant

## 2020-09-12 ENCOUNTER — Other Ambulatory Visit (INDEPENDENT_AMBULATORY_CARE_PROVIDER_SITE_OTHER): Payer: Self-pay

## 2020-09-12 ENCOUNTER — Encounter: Payer: Self-pay | Admitting: Physician Assistant

## 2020-09-12 ENCOUNTER — Ambulatory Visit: Payer: Self-pay | Admitting: Physician Assistant

## 2020-09-12 VITALS — BP 124/78 | HR 85 | Ht 62.0 in | Wt 160.0 lb

## 2020-09-12 DIAGNOSIS — M549 Dorsalgia, unspecified: Secondary | ICD-10-CM

## 2020-09-12 DIAGNOSIS — K219 Gastro-esophageal reflux disease without esophagitis: Secondary | ICD-10-CM

## 2020-09-12 DIAGNOSIS — K8080 Other cholelithiasis without obstruction: Secondary | ICD-10-CM

## 2020-09-12 LAB — CBC
HCT: 43.5 % (ref 39.0–52.0)
Hemoglobin: 14.6 g/dL (ref 13.0–17.0)
MCHC: 33.6 g/dL (ref 30.0–36.0)
MCV: 89.3 fl (ref 78.0–100.0)
Platelets: 196 10*3/uL (ref 150.0–400.0)
RBC: 4.87 Mil/uL (ref 4.22–5.81)
RDW: 14.1 % (ref 11.5–15.5)
WBC: 5.7 10*3/uL (ref 4.0–10.5)

## 2020-09-12 LAB — LIPASE: Lipase: 23 U/L (ref 11.0–59.0)

## 2020-09-12 LAB — COMPREHENSIVE METABOLIC PANEL
ALT: 23 U/L (ref 0–53)
AST: 23 U/L (ref 0–37)
Albumin: 4.4 g/dL (ref 3.5–5.2)
Alkaline Phosphatase: 95 U/L (ref 39–117)
BUN: 13 mg/dL (ref 6–23)
CO2: 26 mEq/L (ref 19–32)
Calcium: 8.7 mg/dL (ref 8.4–10.5)
Chloride: 104 mEq/L (ref 96–112)
Creatinine, Ser: 0.75 mg/dL (ref 0.40–1.50)
GFR: 110.01 mL/min (ref >60.00)
Glucose, Bld: 89 mg/dL (ref 70–99)
Potassium: 4 mEq/L (ref 3.5–5.1)
Sodium: 137 mEq/L (ref 135–145)
Total Bilirubin: 1.1 mg/dL (ref 0.2–1.2)
Total Protein: 7.7 g/dL (ref 6.0–8.3)

## 2020-09-12 MED ORDER — OMEPRAZOLE 20 MG PO CPDR: 20.0000 mg | DELAYED_RELEASE_CAPSULE | Freq: Every day | ORAL | 3 refills | Status: DC

## 2020-09-12 NOTE — Progress Notes (Addendum)
Chief Complaint: Right upper quadrant pain, GERD, cholelithiasis  HPI:     [name] is a 49 year old Spanish-speaking male, previously seen by Dr. Dulce Sellar in [facility] for a colonoscopy, who was referred to me by Bing Neighbors, FNP for a complaint of right upper quadrant pain, GERD, cholelithiasis.      07/24/2020 abdominal ultrasound with cholelithiasis.  A 9 mm nonmobile gallstone is in the region of the gallbladder neck with no wall thickening.  Small gallbladder wall polyp.  Diffuse fatty infiltration of the liver.    Today, patient presents to clinic accompanied by an interpreter. He is a poor historian but describes mainly a right-sided back pain which he feels on a daily basis rated as a 7-8/10 which is worse with movement. 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    Also describes occasionally right upper quadrant abdominal pain sometimes worse after eating a greasy meal, this has been occurring off and on over the past year but "it does not really bother me", it is more the back pain.    Also describes a taste of acid in his mouth when waking up almost daily now. Previously on Omeprazole which was helping, but he could not get any more refills.    Denies fever, chills, weight loss, change in bowel habits or symptoms that awaken him from sleep.  Past Medical History:  Diagnosis Date  . Hypertension     Past Surgical History:  Procedure Laterality Date  . COLONOSCOPY WITH PROPOFOL N/A 11/09/2018   Procedure: COLONOSCOPY WITH PROPOFOL;  Surgeon: Willis Modena, MD;  Location: [facility] ENDOSCOPY;  Service: Endoscopy;  Laterality: N/A;    Current Outpatient Medications  Medication Sig Dispense Refill  . cetirizine (ZYRTEC) 10 MG tablet Take 10 mg by mouth at bedtime.    Marland Kitchen lisinopril (ZESTRIL) 20 MG tablet Take 20 mg by mouth at bedtime.    Marland Kitchen omeprazole (PRILOSEC) 20 MG capsule Take 1 capsule (20 mg total) by mouth  daily. 30 capsule 3   No current facility-administered medications for this visit.    Allergies as of 09/12/2020  . (No Known Allergies)    Family History  Problem Relation Age of Onset  . Diabetes Mother     Social History   Socioeconomic History  . Marital status: Married    Spouse name: Not on file  . Number of children: Not on file  . Years of education: Not on file  . Highest education level: Not on file  Occupational History  . Not on file  Tobacco Use  . Smoking status: Never Smoker  . Smokeless tobacco: Never Used  Vaping Use  . Vaping Use: Never used  Substance and Sexual Activity  . Alcohol use: No  . Drug use: Not on file  . Sexual activity: Yes  Other Topics Concern  . Not on file  Social History Narrative  . Not on file   Social Determinants of Health   Financial Resource Strain:   . Difficulty of Paying Living Expenses: Not on file  Food Insecurity:   . Worried About Programme researcher, broadcasting/film/video in the Last Year: Not on file  . Ran Out of Food in the Last Year: Not on file  Transportation Needs:   . Lack of Transportation (Medical): Not on file  . Lack of Transportation (Non-Medical): Not on file  Physical Activity:   . Days of Exercise per  Week: Not on file  . Minutes of Exercise per Session: Not on file  Stress:   . Feeling of Stress : Not on file  Social Connections:   . Frequency of Communication with Friends and Family: Not on file  . Frequency of Social Gatherings with Friends and Family: Not on file  . Attends Religious Services: Not on file  . Active Member of Clubs or Organizations: Not on file  . Attends Banker Meetings: Not on file  . Marital Status: Not on file  Intimate Partner Violence:   . Fear of Current or Ex-Partner: Not on file  . Emotionally Abused: Not on file  . Physically Abused: Not on file  . Sexually Abused: Not on file    Review of Systems:    Constitutional: No weight loss, fever or chills Skin: No  rash  Cardiovascular: No chest pain Respiratory: No SOB  Gastrointestinal: See HPI and otherwise negative Genitourinary: No dysuria Neurological: No headache, dizziness or syncope Musculoskeletal: No new muscle or joint pain Hematologic: No bleeding  Psychiatric: No history of depression or anxiety   Physical Exam:  Vital signs: BP 124/78   Pulse 85   Ht 5\' 2"  (1.575 m)   Wt 160 lb (72.6 kg)   BMI 29.26 kg/m   Constitutional:   Pleasant Hispanic male appears to be in NAD, Well developed, Well nourished, alert and cooperative Head:  Normocephalic and atraumatic. Eyes:   PEERL, EOMI. No icterus. Conjunctiva pink. Ears:  Normal auditory acuity. Neck:  Supple Throat: Oral cavity and pharynx without inflammation, swelling or lesion.  Respiratory: Respirations even and unlabored. Lungs clear to auscultation bilaterally.   No wheezes, crackles, or rhonchi.  Cardiovascular: Normal S1, S2. No MRG. Regular rate and rhythm. No peripheral edema, cyanosis or pallor.  Gastrointestinal:  Soft, nondistended, nontender. No rebound or guarding. Normal bowel sounds. No appreciable masses or hepatomegaly. Rectal:  Not performed.  Msk:  Symmetrical without gross deformities. Without edema, no deformity or joint abnormality. +right sided tenderness over posterior rib cage on the right Neurologic:  Alert and  oriented x4;  grossly normal neurologically.  Skin:   Dry and intact without significant lesions or rashes. Psychiatric: Oriented to person, place and time. Demonstrates good judgement and reason without abnormal affect or behaviors.  No recent labs. See HPI for imaging.  Assessment: 1. Right-sided back pain: Question musculoskeletal injury from tiling versus kidney etiology 2. Right upper quadrant pain: Very occasional per patient, could be related to known gallstone versus GERD/gastritis 3. Cholelithiasis: 9 mm, seen on recent ultrasound with no gallbladder wall thickening 4. GERD: Daily per  patient, previously improved on omeprazole  Plan: 1. Discussed with patient that I am not entirely sure that it is his cholelithiasis which is giving him his current pain. He is tender to light palpation over his right sided rib cage, more than likely this is musculoskeletal in origin. 2. Ordered a CT of the abdomen pelvis for further evaluation. Pending results of this may send the patient to DO for further evaluation of back pain before referring him to [facility] for possible symptomatic cholelithiasis. 3. Started the patient on Omeprazole 20 mg daily, 30 to 60 minutes for breakfast #30 with 11 refills. 4. Discussed that the patient should maintain a low-fat diet 5. Ordered CBC, CMP and Lipase 6. Patient to follow in clinic per recommendations after CT above. Assigned to Dr. Terrilee Files today.  Adela Lank, PA-C [facility] 09/12/2020, 11:04 AM  Cc:  Bing Neighbors, FNP

## 2020-09-12 NOTE — Patient Instructions (Signed)
START: Omeprazole 77m - Take once daily 30 min before breakfast.   We have sent the following medications to your pharmacy for you to pick up at your convenience: Omeprazole   You have been scheduled [facility] CT [facility]. You are scheduled on 09/21/20  at 8:00am. You should arrive 15 minutes prior to your appointment time for registration.  Please follow the written instructions below on the day of your exam:   1) Do not eat anything after 4:00am (4 hours prior to your test)   2) Drink 1 bottle of contrast @ 6:00am (2 hours prior to your exam)  Remember to shake well before drinking and do NOT pour over ice.     Drink 1 bottle of contrast @ 7:00am (1 hour prior to your exam)   You may take any medications as prescribed with a small amount of water, if necessary. If you take any of the following medications: METFORMIN, GLUCOPHAGE, GLUCOVANCE, AVANDAMET, RIOMET, FORTAMET, AArroyoMET, JANUMET, GLUMETZA or METAGLIP, you MAY be asked to HOLD this medication 48 hours AFTER the exam.   The purpose of you drinking the oral contrast is to aid in the visualization of your intestinal tract. The contrast solution may cause some diarrhea. Depending on your individual set of symptoms, you may also receive an intravenous injection of x-ray contrast/dye. [facility], depending on the type of exam you are having performed.   If you have any questions regarding your exam or if you need to reschedule, you may call [facility] at [phone] the hours of 8:00 am and 5:00 pm, Monday-Friday.    If you are age 5569or older, your body mass index should be between 23-30. Your Body mass index is 29.26 kg/m. If this is out of the aforementioned range listed, please consider follow up with your Primary Care Provider.  If you are age 5562or younger, your body mass index should be between 19-25. Your  Body mass index is 29.26 kg/m. If this is out of the aformentioned range listed, please consider follow up with your Primary Care Provider.    Your provider has requested that you go to the basement level for lab work before leaving today. Press "B" on the elevator. The lab is located at the first door on the left as you exit the elevator.  Due to recent changes in healthcare laws, you may see the results of your imaging and laboratory studies on MyChart before your provider has had a chance to review them.  We understand that in some cases there may be results that are confusing or concerning to you. Not all laboratory results come back in the same time frame and the provider may be waiting for multiple results in order to interpret others.  Please give [id number] hours in order for your provider to thoroughly review all the results before contacting the office for clarification of your results.   Thank you for choosing [facility] and [facility].  JDennison Bulla

## 2020-09-13 NOTE — Progress Notes (Signed)
Agree with assessment and plan as outlined.  

## 2020-09-21 ENCOUNTER — Other Ambulatory Visit: Payer: Self-pay

## 2020-09-21 ENCOUNTER — Ambulatory Visit (HOSPITAL_COMMUNITY)
Admission: RE | Admit: 2020-09-21 | Discharge: 2020-09-21 | Disposition: A | Discharge status: Home / Self Care | Payer: Self-pay | Source: Ambulatory Visit | Attending: Physician Assistant | Admitting: Physician Assistant

## 2020-09-21 DIAGNOSIS — M549 Dorsalgia, unspecified: Secondary | ICD-10-CM | POA: Insufficient documentation

## 2020-09-21 DIAGNOSIS — K8080 Other cholelithiasis without obstruction: Secondary | ICD-10-CM | POA: Insufficient documentation

## 2020-09-21 DIAGNOSIS — K219 Gastro-esophageal reflux disease without esophagitis: Secondary | ICD-10-CM | POA: Insufficient documentation

## 2020-09-21 MED ORDER — IOHEXOL 300 MG/ML  SOLN
100.0000 mL | Freq: Once | INTRAMUSCULAR | Status: AC | PRN
  Administered 2020-09-21: 100 mL via INTRAVENOUS

## 2020-09-25 ENCOUNTER — Other Ambulatory Visit: Payer: Self-pay

## 2020-09-25 DIAGNOSIS — M549 Dorsalgia, unspecified: Secondary | ICD-10-CM

## 2020-10-03 ENCOUNTER — Ambulatory Visit (INDEPENDENT_AMBULATORY_CARE_PROVIDER_SITE_OTHER): Payer: Self-pay

## 2020-10-03 ENCOUNTER — Encounter: Payer: Self-pay | Admitting: Family Medicine

## 2020-10-03 ENCOUNTER — Ambulatory Visit: Payer: Self-pay | Admitting: Family Medicine

## 2020-10-03 ENCOUNTER — Other Ambulatory Visit: Payer: Self-pay

## 2020-10-03 VITALS — BP 118/88 | HR 86 | Ht 62.0 in | Wt 162.0 lb

## 2020-10-03 DIAGNOSIS — M545 Low back pain, unspecified: Secondary | ICD-10-CM

## 2020-10-03 DIAGNOSIS — G8929 Other chronic pain: Secondary | ICD-10-CM

## 2020-10-03 MED ORDER — TIZANIDINE HCL 2 MG PO TABS: 2.0000 mg | ORAL_TABLET | Freq: Every evening | ORAL | 2 refills | Status: AC | PRN

## 2020-10-03 NOTE — Progress Notes (Signed)
[facility] [address] [place] [address] Phone: [phone] Subjective:   Carl Briggs, am serving as a scribe for Dr. Antoine Primas. This visit occurred during the SARS-CoV-2 public health emergency.  Safety protocols were in place, including screening questions prior to the visit, additional usage of staff PPE, and extensive cleaning of exam room while observing appropriate contact time as indicated for disinfecting solutions.   I'm seeing this patient by the request  of:  Bing Neighbors, FNP  CC: Low back pain  VCB:[id number]  Carl Briggs is a 49 y.o. male coming in with complaint of right-sided low back pain for past 6 months.  Patient is accompanied with a translator with Spanish being his native language.  Pain is constant but mild. Pain increases with sitting for prolonged periods. Denies any radiating symptoms.  Never stops him from activity and does not wake him up at night.  Seems to actually be after activity when he has the most pain.  09/21/2020 IMPRESSION: Cholelithiasis.  No CT evidence of acute cholecystitis.  Sigmoid diverticulosis.  No active diverticulitis.  Mildly enlarged prostate.  No acute findings.     Past Medical History:  Diagnosis Date  . Hypertension    Past Surgical History:  Procedure Laterality Date  . COLONOSCOPY WITH PROPOFOL N/A 11/09/2018   Procedure: COLONOSCOPY WITH PROPOFOL;  Surgeon: Willis Modena, MD;  Location: [facility] ENDOSCOPY;  Service: Endoscopy;  Laterality: N/A;   Social History   Socioeconomic History  . Marital status: Married    Spouse name: Not on file  . Number of children: Not on file  . Years of education: Not on file  . Highest education level: Not on file  Occupational History  . Not on file  Tobacco Use  . Smoking status: Never Smoker  . Smokeless tobacco: Never Used  Vaping Use  . Vaping Use: Never used  Substance and Sexual Activity  . Alcohol use:  No  . Drug use: Not on file  . Sexual activity: Yes  Other Topics Concern  . Not on file  Social History Narrative  . Not on file   Social Determinants of Health   Financial Resource Strain:   . Difficulty of Paying Living Expenses: Not on file  Food Insecurity:   . Worried About Programme researcher, broadcasting/film/video in the Last Year: Not on file  . Ran Out of Food in the Last Year: Not on file  Transportation Needs:   . Lack of Transportation (Medical): Not on file  . Lack of Transportation (Non-Medical): Not on file  Physical Activity:   . Days of Exercise per Week: Not on file  . Minutes of Exercise per Session: Not on file  Stress:   . Feeling of Stress : Not on file  Social Connections:   . Frequency of Communication with Friends and Family: Not on file  . Frequency of Social Gatherings with Friends and Family: Not on file  . Attends Religious Services: Not on file  . Active Member of Clubs or Organizations: Not on file  . Attends Banker Meetings: Not on file  . Marital Status: Not on file   No Known Allergies Family History  Problem Relation Age of Onset  . Diabetes Mother      Current Outpatient Medications (Cardiovascular):  .  lisinopril (ZESTRIL) 20 MG tablet, Take 20 mg by mouth at bedtime.  Current Outpatient Medications (Respiratory):  .  cetirizine (ZYRTEC) 10 MG  tablet, Take 10 mg by mouth at bedtime.    Current Outpatient Medications (Other):  .  omeprazole (PRILOSEC) 20 MG capsule, Take 1 capsule (20 mg total) by mouth daily. Marland Kitchen  tiZANidine (ZANAFLEX) 2 MG tablet, Take 1 tablet (2 mg total) by mouth at bedtime as needed for muscle spasms. write in spanish   Reviewed prior external information including notes and imaging from  primary care provider As well as notes that were available from care everywhere and other healthcare systems.  Past medical history, social, surgical and family history all reviewed in electronic medical record.  No pertanent  information unless stated regarding to the chief complaint.   Review of Systems:  No headache, visual changes, nausea, vomiting, diarrhea, constipation, dizziness, abdominal pain, skin rash, fevers, chills, night sweats, weight loss, swollen lymph nodes, body aches, joint swelling, chest pain, shortness of breath, mood changes. POSITIVE muscle aches  Objective  Blood pressure 118/88, pulse 86, height 5\' 2"  (1.575 m), weight 162 lb (73.5 kg), SpO2 98 %.   General: No apparent distress alert and oriented x3 mood and affect normal, dressed appropriately.  HEENT: Pupils equal, extraocular movements intact  Respiratory: Patient's speak in full sentences and does not appear short of breath  Cardiovascular: No lower extremity edema, non tender, no erythema  Neuro: Cranial nerves II through XII are intact, neurovascularly intact in all extremities with 2+ DTRs and 2+ pulses.  Gait normal with good balance and coordination.  MSK:  Non tender with full range of motion and good stability and symmetric strength and tone of shoulders, elbows, wrist, hip, knee and ankles bilaterally.  Low back exam shows that patient does have some mild tightness of the right hip flexor.  Patient has mild tenderness in the paraspinal musculature of the lumbar spine.  Very minimal increase in tightness of the [facility] on the right than the left, negative straight leg test, 5 out of 5 strength of the lower extremities  [id number]; 15 additional minutes spent for Therapeutic exercises as stated in above notes.  This included exercises focusing on stretching, strengthening, with significant focus on eccentric aspects.   Long term goals include an improvement in range of motion, strength, endurance as well as avoiding reinjury. Patient's frequency would include in 1-2 times a day, 3-5 times a week for a duration of 6-12 weeks. Low back exercises that included:  Pelvic tilt/bracing instruction to focus on control of the pelvic girdle and  lower abdominal muscles  Glute strengthening exercises, focusing on proper firing of the glutes without engaging the low back muscles Proper stretching techniques for maximum relief for the hamstrings, hip flexors, low back and some rotation where tolerated   Proper technique shown and discussed handout in great detail with ATC.  All questions were discussed and answered.     Impression and Recommendations:     The above documentation has been reviewed and is accurate and complete [facility], DO

## 2020-10-03 NOTE — Patient Instructions (Addendum)
Xray today Exercises 3x a week zanaflex at night for severe pain oly when you need it Try to do some activities at work leaning to the left instead Ice 20 minutes 2 times daily. Usually after activity and before bed. See me again in 6 weeks

## 2020-10-03 NOTE — Assessment & Plan Note (Signed)
Patient has right-sided low back pain that is worse after activity but not with activity.  Denies any abnormal weight loss, any difficulty with urination.  Patient pain seems to be right before bedtime sometimes and given a muscle relaxer, home exercises for stretching.  Patient did state that he does a lot in a flexed position with his job and especially leaning to the right.  Discussed potentially changing some of the ergonomics at work that could be beneficial.  Discussed potential icing as well.  Follow-up with me again 4 to 6 weeks to make sure he is improving

## 2020-11-14 NOTE — Progress Notes (Deleted)
[facility] [address] [place] [address] Phone: [phone] Subjective:    I'm seeing this patient by the request  of:  Bing Neighbors, FNP  CC:   [id number]   10/03/2020 Patient has right-sided low back pain that is worse after activity but not with activity.  Denies any abnormal weight loss, any difficulty with urination.  Patient pain seems to be right before bedtime sometimes and given a muscle relaxer, home exercises for stretching.  Patient did state that he does a lot in a flexed position with his job and especially leaning to the right.  Discussed potentially changing some of the ergonomics at work that could be beneficial.  Discussed potential icing as well.  Follow-up with me again 4 to 6 weeks to make sure he is improving   Update 11/20/2020 Carl Briggs is a 48 y.o. male coming in with complaint of right sided low back pain. Patient states   Xray lumbar 10/03/2020 IMPRESSION: Negative.      Past Medical History:  Diagnosis Date  . Hypertension    Past Surgical History:  Procedure Laterality Date  . COLONOSCOPY WITH PROPOFOL N/A 11/09/2018   Procedure: COLONOSCOPY WITH PROPOFOL;  Surgeon: Willis Modena, MD;  Location: [facility] ENDOSCOPY;  Service: Endoscopy;  Laterality: N/A;   Social History   Socioeconomic History  . Marital status: Married    Spouse name: Not on file  . Number of children: Not on file  . Years of education: Not on file  . Highest education level: Not on file  Occupational History  . Not on file  Tobacco Use  . Smoking status: Never Smoker  . Smokeless tobacco: Never Used  Vaping Use  . Vaping Use: Never used  Substance and Sexual Activity  . Alcohol use: No  . Drug use: Not on file  . Sexual activity: Yes  Other Topics Concern  . Not on file  Social History Narrative  . Not on file   Social Determinants of Health   Financial Resource Strain:   . Difficulty of Paying  Living Expenses: Not on file  Food Insecurity:   . Worried About Programme researcher, broadcasting/film/video in the Last Year: Not on file  . Ran Out of Food in the Last Year: Not on file  Transportation Needs:   . Lack of Transportation (Medical): Not on file  . Lack of Transportation (Non-Medical): Not on file  Physical Activity:   . Days of Exercise per Week: Not on file  . Minutes of Exercise per Session: Not on file  Stress:   . Feeling of Stress : Not on file  Social Connections:   . Frequency of Communication with Friends and Family: Not on file  . Frequency of Social Gatherings with Friends and Family: Not on file  . Attends Religious Services: Not on file  . Active Member of Clubs or Organizations: Not on file  . Attends Banker Meetings: Not on file  . Marital Status: Not on file   No Known Allergies Family History  Problem Relation Age of Onset  . Diabetes Mother      Current Outpatient Medications (Cardiovascular):  .  lisinopril (ZESTRIL) 20 MG tablet, Take 20 mg by mouth at bedtime.  Current Outpatient Medications (Respiratory):  .  cetirizine (ZYRTEC) 10 MG tablet, Take 10 mg by mouth at bedtime.    Current Outpatient Medications (Other):  .  omeprazole (PRILOSEC) 20 MG capsule, Take 1 capsule (20  mg total) by mouth daily.   Reviewed prior external information including notes and imaging from  primary care provider As well as notes that were available from care everywhere and other healthcare systems.  Past medical history, social, surgical and family history all reviewed in electronic medical record.  No pertanent information unless stated regarding to the chief complaint.   Review of Systems:  No headache, visual changes, nausea, vomiting, diarrhea, constipation, dizziness, abdominal pain, skin rash, fevers, chills, night sweats, weight loss, swollen lymph nodes, body aches, joint swelling, chest pain, shortness of breath, mood changes. POSITIVE muscle  aches  Objective  There were no vitals taken for this visit.   General: No apparent distress alert and oriented x3 mood and affect normal, dressed appropriately.  HEENT: Pupils equal, extraocular movements intact  Respiratory: Patient's speak in full sentences and does not appear short of breath  Cardiovascular: No lower extremity edema, non tender, no erythema  Neuro: Cranial nerves II through XII are intact, neurovascularly intact in all extremities with 2+ DTRs and 2+ pulses.  Gait normal with good balance and coordination.  MSK:  Non tender with full range of motion and good stability and symmetric strength and tone of shoulders, elbows, wrist, hip, knee and ankles bilaterally.     Impression and Recommendations:     The above documentation has been reviewed and is accurate and complete Carl Saa, DO

## 2020-11-20 ENCOUNTER — Ambulatory Visit: Payer: Self-pay | Admitting: Family Medicine

## 2021-08-11 ENCOUNTER — Observation Stay (HOSPITAL_COMMUNITY)
Admission: EM | Admit: 2021-08-11 | Discharge: 2021-08-13 | Disposition: A | Discharge status: Home / Self Care | Payer: Self-pay | Attending: Internal Medicine | Admitting: Internal Medicine

## 2021-08-11 ENCOUNTER — Emergency Department (HOSPITAL_COMMUNITY): Payer: Self-pay

## 2021-08-11 ENCOUNTER — Observation Stay (HOSPITAL_COMMUNITY): Payer: Self-pay

## 2021-08-11 ENCOUNTER — Observation Stay (HOSPITAL_BASED_OUTPATIENT_CLINIC_OR_DEPARTMENT_OTHER): Payer: Self-pay

## 2021-08-11 ENCOUNTER — Other Ambulatory Visit: Payer: Self-pay

## 2021-08-11 DIAGNOSIS — I160 Hypertensive urgency: Secondary | ICD-10-CM | POA: Insufficient documentation

## 2021-08-11 DIAGNOSIS — R7401 Elevation of levels of liver transaminase levels: Secondary | ICD-10-CM | POA: Diagnosis present

## 2021-08-11 DIAGNOSIS — I517 Cardiomegaly: Secondary | ICD-10-CM

## 2021-08-11 DIAGNOSIS — E872 Acidosis, unspecified: Secondary | ICD-10-CM | POA: Diagnosis present

## 2021-08-11 DIAGNOSIS — R079 Chest pain, unspecified: Secondary | ICD-10-CM | POA: Diagnosis present

## 2021-08-11 DIAGNOSIS — F1910 Other psychoactive substance abuse, uncomplicated: Secondary | ICD-10-CM | POA: Insufficient documentation

## 2021-08-11 DIAGNOSIS — Z79899 Other long term (current) drug therapy: Secondary | ICD-10-CM | POA: Insufficient documentation

## 2021-08-11 DIAGNOSIS — R911 Solitary pulmonary nodule: Secondary | ICD-10-CM | POA: Insufficient documentation

## 2021-08-11 DIAGNOSIS — R6510 Systemic inflammatory response syndrome (SIRS) of non-infectious origin without acute organ dysfunction: Secondary | ICD-10-CM | POA: Insufficient documentation

## 2021-08-11 DIAGNOSIS — I10 Essential (primary) hypertension: Secondary | ICD-10-CM | POA: Insufficient documentation

## 2021-08-11 DIAGNOSIS — E785 Hyperlipidemia, unspecified: Secondary | ICD-10-CM | POA: Insufficient documentation

## 2021-08-11 DIAGNOSIS — Y900 Blood alcohol level of less than 20 mg/100 ml: Secondary | ICD-10-CM | POA: Insufficient documentation

## 2021-08-11 DIAGNOSIS — F10239 Alcohol dependence with withdrawal, unspecified: Secondary | ICD-10-CM | POA: Diagnosis present

## 2021-08-11 DIAGNOSIS — R0600 Dyspnea, unspecified: Secondary | ICD-10-CM

## 2021-08-11 DIAGNOSIS — R000 Tachycardia, unspecified: Secondary | ICD-10-CM | POA: Insufficient documentation

## 2021-08-11 DIAGNOSIS — F10230 Alcohol dependence with withdrawal, uncomplicated: Principal | ICD-10-CM | POA: Insufficient documentation

## 2021-08-11 DIAGNOSIS — F10939 Alcohol use, unspecified with withdrawal, unspecified: Secondary | ICD-10-CM | POA: Diagnosis present

## 2021-08-11 DIAGNOSIS — Z20822 Contact with and (suspected) exposure to covid-19: Secondary | ICD-10-CM | POA: Insufficient documentation

## 2021-08-11 DIAGNOSIS — E871 Hypo-osmolality and hyponatremia: Secondary | ICD-10-CM | POA: Insufficient documentation

## 2021-08-11 LAB — CBC WITH DIFFERENTIAL/PLATELET
Abs Immature Granulocytes: 0.04 10*3/uL (ref 0.00–0.07)
Basophils Absolute: 0 10*3/uL (ref 0.0–0.1)
Basophils Relative: 0 %
Eosinophils Absolute: 0 10*3/uL (ref 0.0–0.5)
Eosinophils Relative: 0 %
HCT: 46.2 % (ref 39.0–52.0)
Hemoglobin: 15.9 g/dL (ref 13.0–17.0)
Immature Granulocytes: 0 %
Lymphocytes Relative: 10 %
Lymphs Abs: 1.2 10*3/uL (ref 0.7–4.0)
MCH: 30.9 pg (ref 26.0–34.0)
MCHC: 34.4 g/dL (ref 30.0–36.0)
MCV: 89.9 fL (ref 80.0–100.0)
Monocytes Absolute: 0.9 10*3/uL (ref 0.1–1.0)
Monocytes Relative: 7 %
Neutro Abs: 9.6 10*3/uL — ABNORMAL HIGH (ref 1.7–7.7)
Neutrophils Relative %: 83 %
Platelets: 249 10*3/uL (ref 150–400)
RBC: 5.14 MIL/uL (ref 4.22–5.81)
RDW: 14.6 % (ref 11.5–15.5)
WBC: 11.7 10*3/uL — ABNORMAL HIGH (ref 4.0–10.5)
nRBC: 0 % (ref 0.0–0.2)

## 2021-08-11 LAB — HEPATITIS PANEL, ACUTE
HCV Ab: NONREACTIVE
Hep A IgM: NONREACTIVE
Hep B C IgM: NONREACTIVE
Hepatitis B Surface Ag: NONREACTIVE

## 2021-08-11 LAB — COMPREHENSIVE METABOLIC PANEL
ALT: 31 U/L (ref 0–44)
AST: 50 U/L — ABNORMAL HIGH (ref 15–41)
Albumin: 4.3 g/dL (ref 3.5–5.0)
Alkaline Phosphatase: 98 U/L (ref 38–126)
Anion gap: 24 — ABNORMAL HIGH (ref 5–15)
BUN: 20 mg/dL (ref 6–20)
CO2: 14 mmol/L — ABNORMAL LOW (ref 22–32)
Calcium: 9 mg/dL (ref 8.9–10.3)
Chloride: 96 mmol/L — ABNORMAL LOW (ref 98–111)
Creatinine, Ser: 0.81 mg/dL (ref 0.61–1.24)
GFR, Estimated: >60 mL/min (ref >60)
Glucose, Bld: 159 mg/dL — ABNORMAL HIGH (ref 70–99)
Potassium: 3.7 mmol/L (ref 3.5–5.1)
Sodium: 134 mmol/L — ABNORMAL LOW (ref 135–145)
Total Bilirubin: 1.3 mg/dL — ABNORMAL HIGH (ref 0.3–1.2)
Total Protein: 8 g/dL (ref 6.5–8.1)

## 2021-08-11 LAB — ECHOCARDIOGRAM COMPLETE
AR max vel: 2.9 cm2
AV Area VTI: 3.28 cm2
AV Area mean vel: 2.95 cm2
AV Mean grad: 4 mmHg
AV Peak grad: 8.2 mmHg
Ao pk vel: 1.43 m/s
Area-P 1/2: 4.21 cm2
Height: 61 in
MV VTI: 4.34 cm2
S' Lateral: 2.5 cm
Weight: 2560 oz

## 2021-08-11 LAB — URINALYSIS, ROUTINE W REFLEX MICROSCOPIC
Bacteria, UA: NONE SEEN
Bilirubin Urine: NEGATIVE
Glucose, UA: 150 mg/dL — AB
Ketones, ur: 20 mg/dL — AB
Leukocytes,Ua: NEGATIVE
Nitrite: NEGATIVE
Protein, ur: 100 mg/dL — AB
Specific Gravity, Urine: >1.046 — ABNORMAL HIGH (ref 1.005–1.030)
pH: 6 (ref 5.0–8.0)

## 2021-08-11 LAB — LIPID PANEL
Cholesterol: 209 mg/dL — ABNORMAL HIGH (ref 0–200)
HDL: 66 mg/dL (ref >40)
LDL Cholesterol: 126 mg/dL — ABNORMAL HIGH (ref 0–99)
Total CHOL/HDL Ratio: 3.2 RATIO
Triglycerides: 84 mg/dL (ref <150)
VLDL: 17 mg/dL (ref 0–40)

## 2021-08-11 LAB — RAPID URINE DRUG SCREEN, HOSP PERFORMED
Amphetamines: POSITIVE — AB
Barbiturates: NOT DETECTED
Benzodiazepines: NOT DETECTED
Cocaine: NOT DETECTED
Opiates: NOT DETECTED
Tetrahydrocannabinol: NOT DETECTED

## 2021-08-11 LAB — BASIC METABOLIC PANEL
Anion gap: 9 (ref 5–15)
Anion gap: 7 (ref 5–15)
BUN: 14 mg/dL (ref 6–20)
BUN: 10 mg/dL (ref 6–20)
CO2: 24 mmol/L (ref 22–32)
CO2: 25 mmol/L (ref 22–32)
Calcium: 8.4 mg/dL — ABNORMAL LOW (ref 8.9–10.3)
Calcium: 8 mg/dL — ABNORMAL LOW (ref 8.9–10.3)
Chloride: 105 mmol/L (ref 98–111)
Chloride: 105 mmol/L (ref 98–111)
Creatinine, Ser: 0.61 mg/dL (ref 0.61–1.24)
Creatinine, Ser: 0.55 mg/dL — ABNORMAL LOW (ref 0.61–1.24)
GFR, Estimated: >60 mL/min (ref >60)
GFR, Estimated: >60 mL/min (ref >60)
Glucose, Bld: 144 mg/dL — ABNORMAL HIGH (ref 70–99)
Glucose, Bld: 136 mg/dL — ABNORMAL HIGH (ref 70–99)
Potassium: 3.4 mmol/L — ABNORMAL LOW (ref 3.5–5.1)
Potassium: 3.2 mmol/L — ABNORMAL LOW (ref 3.5–5.1)
Sodium: 138 mmol/L (ref 135–145)
Sodium: 137 mmol/L (ref 135–145)

## 2021-08-11 LAB — HIV ANTIBODY (ROUTINE TESTING W REFLEX): HIV Screen 4th Generation wRfx: NONREACTIVE

## 2021-08-11 LAB — TROPONIN I (HIGH SENSITIVITY)
Troponin I (High Sensitivity): 16 ng/L (ref <18)
Troponin I (High Sensitivity): 34 ng/L — ABNORMAL HIGH (ref <18)
Troponin I (High Sensitivity): 40 ng/L — ABNORMAL HIGH (ref <18)
Troponin I (High Sensitivity): 28 ng/L — ABNORMAL HIGH (ref <18)

## 2021-08-11 LAB — ETHANOL: Alcohol, Ethyl (B): <10 mg/dL (ref <10)

## 2021-08-11 LAB — D-DIMER, QUANTITATIVE: D-Dimer, Quant: 0.7 ug/mL-FEU — ABNORMAL HIGH (ref 0.00–0.50)

## 2021-08-11 LAB — TSH: TSH: 1.263 u[IU]/mL (ref 0.350–4.500)

## 2021-08-11 LAB — MAGNESIUM: Magnesium: 2 mg/dL (ref 1.7–2.4)

## 2021-08-11 LAB — SARS CORONAVIRUS 2 (TAT 6-24 HRS): SARS Coronavirus 2: NEGATIVE

## 2021-08-11 LAB — BRAIN NATRIURETIC PEPTIDE: B Natriuretic Peptide: 21 pg/mL (ref 0.0–100.0)

## 2021-08-11 LAB — LACTIC ACID, PLASMA
Lactic Acid, Venous: 3.7 mmol/L (ref 0.5–1.9)
Lactic Acid, Venous: 3.8 mmol/L (ref 0.5–1.9)

## 2021-08-11 LAB — PHOSPHORUS: Phosphorus: 2.2 mg/dL — ABNORMAL LOW (ref 2.5–4.6)

## 2021-08-11 MED ORDER — ALUM & MAG HYDROXIDE-SIMETH 200-200-20 MG/5ML PO SUSP
30.0000 mL | Freq: Four times a day (QID) | ORAL | Status: DC | PRN
  Filled 2021-08-11: qty 30

## 2021-08-11 MED ORDER — LORAZEPAM 1 MG PO TABS
1.0000 mg | ORAL_TABLET | ORAL | Status: DC | PRN
  Administered 2021-08-12: 1 mg via ORAL
  Filled 2021-08-11: qty 1

## 2021-08-11 MED ORDER — LORAZEPAM 2 MG/ML IJ SOLN: 1.0000 mg | INTRAMUSCULAR | Status: DC | PRN

## 2021-08-11 MED ORDER — HYDRALAZINE HCL 20 MG/ML IJ SOLN
10.0000 mg | INTRAMUSCULAR | Status: DC | PRN
  Administered 2021-08-12 – 2021-08-13 (×2): 10 mg via INTRAVENOUS
  Filled 2021-08-11 (×2): qty 1

## 2021-08-11 MED ORDER — ADENOSINE 6 MG/2ML IV SOLN
INTRAVENOUS | Status: AC
  Administered 2021-08-11: 6 mg via INTRAVENOUS
  Filled 2021-08-11: qty 4

## 2021-08-11 MED ORDER — PANTOPRAZOLE SODIUM 40 MG PO TBEC
40.0000 mg | DELAYED_RELEASE_TABLET | Freq: Every day | ORAL | Status: DC
  Administered 2021-08-11 – 2021-08-13 (×3): 40 mg via ORAL
  Filled 2021-08-11 (×3): qty 1

## 2021-08-11 MED ORDER — ADENOSINE 6 MG/2ML IV SOLN: 6.0000 mg | Freq: Once | INTRAVENOUS | Status: AC

## 2021-08-11 MED ORDER — IOHEXOL 350 MG/ML SOLN
100.0000 mL | Freq: Once | INTRAVENOUS | Status: AC | PRN
  Administered 2021-08-11: 100 mL via INTRAVENOUS

## 2021-08-11 MED ORDER — ACETAMINOPHEN 325 MG PO TABS: 650.0000 mg | ORAL_TABLET | ORAL | Status: DC | PRN

## 2021-08-11 MED ORDER — FOLIC ACID 1 MG PO TABS
1.0000 mg | ORAL_TABLET | Freq: Every day | ORAL | Status: DC
  Administered 2021-08-11 – 2021-08-13 (×3): 1 mg via ORAL
  Filled 2021-08-11 (×3): qty 1

## 2021-08-11 MED ORDER — ONDANSETRON HCL 4 MG/2ML IJ SOLN: 4.0000 mg | Freq: Four times a day (QID) | INTRAMUSCULAR | Status: DC | PRN

## 2021-08-11 MED ORDER — SODIUM CHLORIDE 0.9 % IV BOLUS
500.0000 mL | Freq: Once | INTRAVENOUS | Status: AC
  Administered 2021-08-11: 500 mL via INTRAVENOUS

## 2021-08-11 MED ORDER — ADULT MULTIVITAMIN W/MINERALS CH
1.0000 | ORAL_TABLET | Freq: Every day | ORAL | Status: DC
  Administered 2021-08-11 – 2021-08-13 (×3): 1 via ORAL
  Filled 2021-08-11 (×3): qty 1

## 2021-08-11 MED ORDER — POTASSIUM PHOSPHATES 15 MMOLE/5ML IV SOLN
20.0000 mmol | Freq: Once | INTRAVENOUS | Status: AC
  Administered 2021-08-11: 20 mmol via INTRAVENOUS
  Filled 2021-08-11 (×2): qty 6.67

## 2021-08-11 MED ORDER — LABETALOL HCL 5 MG/ML IV SOLN
10.0000 mg | Freq: Once | INTRAVENOUS | Status: AC
  Administered 2021-08-11: 10 mg via INTRAVENOUS
  Filled 2021-08-11: qty 4

## 2021-08-11 MED ORDER — LORAZEPAM 2 MG/ML IJ SOLN: 0.0000 mg | Freq: Two times a day (BID) | INTRAMUSCULAR | Status: DC

## 2021-08-11 MED ORDER — LABETALOL HCL 5 MG/ML IV SOLN: 5.0000 mg | INTRAVENOUS | Status: DC | PRN

## 2021-08-11 MED ORDER — THIAMINE HCL 100 MG PO TABS
100.0000 mg | ORAL_TABLET | Freq: Every day | ORAL | Status: DC
  Administered 2021-08-11 – 2021-08-13 (×3): 100 mg via ORAL
  Filled 2021-08-11 (×3): qty 1

## 2021-08-11 MED ORDER — HYDROCODONE-ACETAMINOPHEN 5-325 MG PO TABS
1.0000 | ORAL_TABLET | Freq: Four times a day (QID) | ORAL | Status: DC | PRN
Start: 2021-08-11 — End: 2021-08-13
  Administered 2021-08-11 – 2021-08-12 (×3): 1 via ORAL
  Filled 2021-08-11 (×5): qty 1

## 2021-08-11 MED ORDER — SODIUM CHLORIDE 0.9 % IV BOLUS
1000.0000 mL | Freq: Once | INTRAVENOUS | Status: AC
  Administered 2021-08-11: 1000 mL via INTRAVENOUS

## 2021-08-11 MED ORDER — LORAZEPAM 2 MG/ML IJ SOLN
0.0000 mg | Freq: Four times a day (QID) | INTRAMUSCULAR | Status: AC
  Administered 2021-08-11: 2 mg via INTRAVENOUS
  Administered 2021-08-12: 1 mg via INTRAVENOUS
  Filled 2021-08-11 (×2): qty 1

## 2021-08-11 MED ORDER — LISINOPRIL 20 MG PO TABS
20.0000 mg | ORAL_TABLET | Freq: Every day | ORAL | Status: DC
  Administered 2021-08-11 – 2021-08-12 (×2): 20 mg via ORAL
  Filled 2021-08-11 (×2): qty 1

## 2021-08-11 MED ORDER — LORAZEPAM 2 MG/ML IJ SOLN
1.0000 mg | Freq: Once | INTRAMUSCULAR | Status: AC
  Administered 2021-08-11: 1 mg via INTRAVENOUS
  Filled 2021-08-11: qty 1

## 2021-08-11 MED ORDER — SODIUM CHLORIDE 0.9 % IV SOLN: INTRAVENOUS | Status: DC

## 2021-08-11 MED ORDER — THIAMINE HCL 100 MG/ML IJ SOLN: 100.0000 mg | Freq: Every day | INTRAMUSCULAR | Status: DC

## 2021-08-11 MED ORDER — ENOXAPARIN SODIUM 40 MG/0.4ML IJ SOSY
40.0000 mg | PREFILLED_SYRINGE | Freq: Every day | INTRAMUSCULAR | Status: DC
  Administered 2021-08-11 – 2021-08-12 (×2): 40 mg via SUBCUTANEOUS
  Filled 2021-08-11 (×3): qty 0.4

## 2021-08-11 NOTE — ED Notes (Signed)
Per Echo tech, echo could not be completed at this time due to pt's heart rate being elevated. Will continue to monitor.

## 2021-08-11 NOTE — ED Provider Notes (Signed)
Carl Briggs   CSN: [id number] Arrival date & time: 08/11/21  0235     History Chief Complaint  Patient presents with   Chest Pain   Shortness of Breath    Carl Briggs is a 50 y.o. male.  Patient is a 50 year old male with past medical history of hypertension.  He presents today with complaints of shortness of breath.  He describes a 3-day history of worsening breathing which became worse this evening.  He called 911 and was transported here.  He does report some chest tightness, but denies fevers, or chills.  He denies vomiting or diarrhea.  He denies any leg swelling or calf pain.  The history is provided by the patient.  Chest Pain Pain location:  Substernal area Pain quality: tightness   Pain radiates to:  Does not radiate Pain severity:  Moderate Onset quality:  Gradual Duration:  3 days Timing:  Constant Progression:  Worsening Chronicity:  New Relieved by:  Nothing Worsened by:  Nothing Ineffective treatments:  None tried Associated symptoms: shortness of breath   Shortness of Breath Associated symptoms: chest pain       Past Medical History:  Diagnosis Date   Hypertension     Patient Active Problem List   Diagnosis Date Noted   Low back pain 10/03/2020   Rectal bleeding    GI bleed 11/07/2018   Acute posthemorrhagic anemia 11/07/2018   GI bleeding 11/07/2018    Past Surgical History:  Procedure Laterality Date   COLONOSCOPY WITH PROPOFOL N/A 11/09/2018   Procedure: COLONOSCOPY WITH PROPOFOL;  Surgeon: Willis Modena, MD;  Location: [facility] ENDOSCOPY;  Service: Endoscopy;  Laterality: N/A;       Family History  Problem Relation Age of Onset   Diabetes Mother     Social History   Tobacco Use   Smoking status: Never   Smokeless tobacco: Never  Vaping Use   Vaping Use: Never used  Substance Use Topics   Alcohol use: No    Home Medications Prior to Admission medications    Medication Sig Start Date End Date Taking? Authorizing Provider  lisinopril (ZESTRIL) 20 MG tablet Take 20 mg by mouth at bedtime. 09/07/20  Yes [provider]  omeprazole (PRILOSEC) 20 MG capsule Take 1 capsule (20 mg total) by mouth daily. Patient not taking: No sig reported 09/12/20   Unk Lightning, PA    Allergies    Patient has no known allergies.  Review of Systems   Review of Systems  Respiratory:  Positive for shortness of breath.   Cardiovascular:  Positive for chest pain.  All other systems reviewed and are negative.  Physical Exam Updated Vital Signs BP (!) 165/104   Pulse (!) 149   Temp 98.3 F (36.8 C) (Oral)   Resp 16   Ht 5\' 1"  (1.549 m)   Wt 73.5 kg   SpO2 97%   BMI 30.62 kg/m   Physical Exam Vitals and nursing Briggs reviewed.  Constitutional:      General: He is not in acute distress.    Appearance: He is well-developed. He is not diaphoretic.  HENT:     Head: Normocephalic and atraumatic.  Cardiovascular:     Rate and Rhythm: Regular rhythm. Tachycardia present.     Heart sounds: No murmur heard.   No friction rub.  Pulmonary:     Effort: Pulmonary effort is normal. No respiratory distress.     Breath sounds: Normal breath sounds.  No wheezing or rales.  Abdominal:     General: Bowel sounds are normal. There is no distension.     Palpations: Abdomen is soft.     Tenderness: There is no abdominal tenderness.  Musculoskeletal:        General: Normal range of motion.     Cervical back: Normal range of motion and neck supple.  Skin:    General: Skin is warm and dry.  Neurological:     Mental Status: He is alert and oriented to person, place, and time.     Coordination: Coordination normal.    ED Results / Procedures / Treatments   Labs (all labs ordered are listed, but only abnormal results are displayed) Labs Reviewed  COMPREHENSIVE METABOLIC PANEL  BRAIN NATRIURETIC PEPTIDE  CBC WITH DIFFERENTIAL/PLATELET  D-DIMER,  QUANTITATIVE  TROPONIN I (HIGH SENSITIVITY)    EKG EKG Interpretation  Date/Time:  Saturday August 11 2021 04:10:28 EDT Ventricular Rate:  143 PR Interval:  137 QRS Duration: 79 QT Interval:  276 QTC Calculation: 426 R Axis:   -19 Text Interpretation: Sinus tachycardia Borderline left axis deviation Confirmed by Geoffery Lyons ([id number]) on 08/11/2021 5:34:56 AM  Radiology No results found.  Procedures Procedures   Medications Ordered in ED Medications  adenosine (ADENOCARD) 6 MG/2ML injection 6 mg (6 mg Intravenous Given 08/11/21 0410)  sodium chloride 0.9 % bolus 500 mL (500 mLs Intravenous New Bag/Given 08/11/21 0414)    ED Course  I have reviewed the triage vital signs and the nursing notes.  Pertinent labs & imaging results that were available during my care of the patient were reviewed by me and considered in my medical decision making (see chart for details).    MDM Rules/Calculators/A&P  Patient presenting here with complaints of chest tightness and shortness of breath that began 3 days ago.  He arrives here tachycardic with heart rate of 150.  Patient was given adenosine to confirm that this was a sinus rhythm versus a flutter.  Adenosine given because of the heart rate to slow into the 130s with definitive P waves consistent with a sinus tachycardia.  Work-up then initiated including laboratory studies, troponin, D-dimer, and CTA of the chest.  D-dimer was mildly elevated, however CT scan shows no evidence for pulmonary embolism.  Initial troponin in the normal range, however repeat is mildly elevated, likely related to demand.  Electrolytes show CO2 of 14 with elevated anion gap.  IV hydration initiated and heart rate seems to be improving somewhat.  His rate is now in the 130s.  Serum lactate will be obtained.  Hospitalist to be consulted for admission.  CRITICAL CARE Performed by: Geoffery Lyons Total critical care time: 45 minutes Critical care time was  exclusive of separately billable procedures and treating other patients. Critical care was necessary to treat or prevent imminent or life-threatening deterioration. Critical care was time spent personally by me on the following activities: development of treatment plan with patient and/or surrogate as well as nursing, discussions with consultants, evaluation of patient's response to treatment, examination of patient, obtaining history from patient or surrogate, ordering and performing treatments and interventions, ordering and review of laboratory studies, ordering and review of radiographic studies, pulse oximetry and re-evaluation of patient's condition.   Final Clinical Impression(s) / ED Diagnoses Final diagnoses:  None    Rx / DC Orders ED Discharge Orders     None        Geoffery Lyons, MD 08/11/21 [id number]

## 2021-08-11 NOTE — Progress Notes (Signed)
  Echocardiogram The heart rate is too high to perform the echo. We will check back as time allows or let us know if it comes down please  Shona Simpson 08/11/2021, 10:51 AM
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
## 2021-08-11 NOTE — H&P (Addendum)
History and Physical    Carl Briggs BJS:[id number] DOB: Oct 06, 1971 DOA: 08/11/2021  Referring MD/NP/PA: Geoffery Lyons, MD PCP: Bing Neighbors, FNP  Patient coming from: Home via EMS  Chief Complaint: Feeling bad  I have personally briefly reviewed patient's old medical records in [facility]   HPI: Carl Briggs is a 50 y.o. Spanish-speaking male with medical history significant of hypertension who presented with complaints of feeling bad over the last 2-3 days.  History is obtained with use of Spanish interpreter services.  Over the last week patient had been drinking one 1 L bottle of tequila per day on average with his last drink reported 2 days ago.  Unable to get patient to explain why he started back drinking, but previously had quit for over 15 years or more.  During this last week he had only been drinking alcohol, and not eating or drinking anything else.  Reported associated symptoms of headache, photophobia, left-sided chest discomfort, palpitations, and shortness of breath.  He describes the chest pain as a "clamping" pain.  Denies having any fever, cough, abdominal pain, nausea, vomiting, diarrhea, leg swelling, calf pain, suicidal ideations, or trauma/fall.  He is vaccinated and boosted against COVID-19.  Due to worsening in his breathing and the chest discomfort to the hospital for further evaluation.  ED Course: Upon admission to the emergency department patient was seen to be afebrile, heart rates elevated up to 161, tachypneic, blood pressure elevated up to 181/98, and O2 saturation maintained on room air.  Labs significant for WBC 11.7, sodium 134, BUN 20, creatinine 0.81, glucose 159, AST 50, ALT 30, total bilirubin 1.3, TSH 1.263, BNP 21, and troponin 16-> 31.  The ED provider questioning the possibility of atrial flutter versus sinus tach and had given the patient adenosine 6 mg with temporary improvement down to 130s.  Patient was noted to have  definitive P waves for which sinus tachycardia was thought to be cause of symptoms.  CTA of the chest was obtained after D-dimer was noted to be mildly elevated, but there were no signs of a pulmonary embolus or pneumonia appreciated given the elevated anion gap with CO2 of 14.  Due to concern for metabolic acidosis with elevated anion gap patient given 1.5 L of normal saline IV fluids with some improvement in heart rate.  The ED provider talked with the patient's daughter who gave concern for alcohol abuse for which Ativan 1 mg was ordered.  Review of Systems  Constitutional:  Positive for malaise/fatigue. Negative for fever.  HENT:  Negative for congestion and nosebleeds.   Eyes:  Positive for photophobia.  Respiratory:  Positive for shortness of breath. Negative for cough.   Cardiovascular:  Positive for chest pain. Negative for leg swelling.  Gastrointestinal:  Negative for abdominal pain, blood in stool, diarrhea, nausea and vomiting.  Genitourinary:  Negative for dysuria and hematuria.  Musculoskeletal:  Negative for falls.  Skin:  Negative for rash.  Neurological:  Positive for dizziness, weakness and headaches. Negative for loss of consciousness.  Psychiatric/Behavioral:  Positive for substance abuse.    Past Medical History:  Diagnosis Date   Hypertension     Past Surgical History:  Procedure Laterality Date   COLONOSCOPY WITH PROPOFOL N/A 11/09/2018   Procedure: COLONOSCOPY WITH PROPOFOL;  Surgeon: Willis Modena, MD;  Location: [facility] ENDOSCOPY;  Service: Endoscopy;  Laterality: N/A;     reports that he has never smoked. He has never used smokeless tobacco. He reports that he  does not drink alcohol. No history on file for drug use.  No Known Allergies  Family History  Problem Relation Age of Onset   Diabetes Mother     Prior to Admission medications   Medication Sig Start Date End Date Taking? Authorizing Provider  lisinopril (ZESTRIL) 20 MG tablet Take 20 mg by mouth at  bedtime. 09/07/20  Yes [provider]  omeprazole (PRILOSEC) 20 MG capsule Take 1 capsule (20 mg total) by mouth daily. Patient not taking: No sig reported 09/12/20   Unk Lightning, PA    Physical Exam:  Constitutional: Middle-age male who appears able to follow commands at this Vitals:   08/11/21 0600 08/11/21 0726 08/11/21 0730 08/11/21 0800  BP: (!) 158/97 (!) 165/100 (!) 162/101 (!) 165/87  Pulse: (!) 141 (!) 134 (!) 136 (!) 129  Resp: 19 20 18  (!) 25  Temp:      TempSrc:      SpO2: 98% 99% 97% 98%  Weight:      Height:       Eyes: PERRL, lids and conjunctivae normal ENMT: Mucous membranes are dry. Posterior pharynx clear of any exudate or lesions.  Neck: normal, supple, no masses, no thyromegaly.  No JVD Respiratory: Normal respiratory effort with bibasilar crackles appreciated, but otherwise clear to auscultation.  No significant wheezes or rhonchi appreciated.  Patient currently on 4 L nasal cannula oxygen with O2 saturations maintained Cardiovascular: Tachycardic, no murmurs / rubs / gallops. No extremity edema. 2+ pedal pulses. No carotid bruits.  Abdomen: no tenderness, no masses palpated. No hepatosplenomegaly. Bowel sounds positive.  Musculoskeletal: no clubbing / cyanosis. No joint deformity upper and lower extremities. Good ROM, no contractures. Normal muscle tone.  Skin: no rashes, lesions, ulcers. No induration Neurologic: CN 2-12 grossly intact. Sensation intact, DTR normal. Strength 5/5 in all 4.  Mild tremor noted on physical exam Psychiatric: Normal judgment and insight. Alert and oriented x 3. Normal mood.     Labs on Admission: I have personally reviewed following labs and imaging studies  CBC: Recent Labs  Lab 08/11/21 0403  WBC 11.7*  NEUTROABS 9.6*  HGB 15.9  HCT 46.2  MCV 89.9  PLT 249   Basic Metabolic Panel: Recent Labs  Lab 08/11/21 0403  NA 134*  K 3.7  CL 96*  CO2 14*  GLUCOSE 159*  BUN 20  CREATININE 0.81   CALCIUM 9.0   GFR: Estimated Creatinine Clearance: 92.8 mL/min (by C-G formula based on SCr of 0.81 mg/dL). Liver Function Tests: Recent Labs  Lab 08/11/21 0403  AST 50*  ALT 31  ALKPHOS 98  BILITOT 1.3*  PROT 8.0  ALBUMIN 4.3   No results for input(s): LIPASE, AMYLASE in the last 168 hours. No results for input(s): AMMONIA in the last 168 hours. Coagulation Profile: No results for input(s): INR, PROTIME in the last 168 hours. Cardiac Enzymes: No results for input(s): CKTOTAL, CKMB, CKMBINDEX, TROPONINI in the last 168 hours. BNP (last 3 results) No results for input(s): PROBNP in the last 8760 hours. HbA1C: No results for input(s): HGBA1C in the last 72 hours. CBG: No results for input(s): GLUCAP in the last 168 hours. Lipid Profile: No results for input(s): CHOL, HDL, LDLCALC, TRIG, CHOLHDL, LDLDIRECT in the last 72 hours. Thyroid Function Tests: Recent Labs    08/11/21 0250  TSH 1.263   Anemia Panel: No results for input(s): VITAMINB12, FOLATE, FERRITIN, TIBC, IRON, RETICCTPCT in the last 72 hours. Urine analysis:    Component Value  Date/Time   COLORURINE YELLOW 01/13/2016 2200   APPEARANCEUR CLOUDY (A) 01/13/2016 2200   LABSPEC 1.014 01/13/2016 2200   PHURINE 7.5 01/13/2016 2200   GLUCOSEU NEGATIVE 01/13/2016 2200   HGBUR TRACE (A) 01/13/2016 2200   BILIRUBINUR NEGATIVE 01/13/2016 2200   KETONESUR NEGATIVE 01/13/2016 2200   PROTEINUR NEGATIVE 01/13/2016 2200   NITRITE NEGATIVE 01/13/2016 2200   LEUKOCYTESUR NEGATIVE 01/13/2016 2200   Sepsis Labs: No results found for this or any previous visit (from the past 240 hour(s)).   Radiological Exams on Admission: CT Angio Chest PE W and/or Wo Contrast  Result Date: 08/11/2021 CLINICAL DATA:  Chest pain, shortness of breath. High probability suspicion for PE. EXAM: CT ANGIOGRAPHY CHEST WITH CONTRAST TECHNIQUE: Multidetector CT imaging of the chest was performed using the standard protocol during bolus  administration of intravenous contrast. Multiplanar CT image reconstructions and MIPs were obtained to evaluate the vascular anatomy. CONTRAST:  100mL OMNIPAQUE IOHEXOL 350 MG/ML SOLN COMPARISON:  CT abdomen/pelvis 09/21/2020 FINDINGS: Cardiovascular: Satisfactory opacification of the pulmonary arteries to the segmental level. No evidence of pulmonary embolism. Cardiomegaly. No pericardial effusion. Mediastinum/Nodes: Partially peripherally calcified lymph node in the AP window measures up to 1.2 cm in short axis. No other lymphadenopathy or mediastinal mass identified. Unremarkable appearance of the thyroid gland and esophagus. Lungs/Pleura: Approximately 1.1 x 1.0 cm pulmonary nodule within internal mineralization/calcification in the periphery of the left upper lobe (image 49 series 7). 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 5 mm pulmonary nodule in the periphery of the right middle lobe (image 60 series 7). Mild dependent atelectasis. No focal airspace infiltrate, pulmonary edema, pleural effusion or pneumothorax. Upper Abdomen: Diffuse low attenuation of the hepatic parenchyma consistent with hepatic steatosis. Stones are present within the gallbladder. No biliary ductal dilatation. Interval collapse of previously identified cyst arising from the posterior aspect of the right kidney. Musculoskeletal: No acute fracture or aggressive appearing lytic or blastic osseous lesion. Review of the MIP images confirms the above findings. IMPRESSION: 1. Negative for acute pulmonary embolus, pneumonia or other acute cardiopulmonary process. 2. Mild cardiomegaly. Clinical symptoms of chest pain and shortness of breath may be cardiac in nature. 3. 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 4. Small 5 mm right middle lobe pulmonary nodule noted incidentally. No follow-up needed if patient is low-risk. Non-contrast chest CT can be considered in 12 months if patient is high-risk. This recommendation follows the consensus statement: Guidelines for Management of Incidental Pulmonary Nodules Detected on CT Images: From the Fleischner Society 2017; Radiology 2017; 284:228-243. 5. Hepatic steatosis. 6. Cholelithiasis. 7. Interval collapse/resolution of previously identified right interpolar renal cyst. Electronically Signed   By: Malachy MoanHeath  McCullough M.D.   On: 08/11/2021 07:28    EKG: Independently reviewed.  Sinus tachycardia at 100 and  Assessment/Plan Suspect alcohol withdrawal sinus tachycardia: Acute.  Patient admits t over the last week that he had been drinking significant amounts of tequila daily before stopping 2 days ago.  Present with heart rates into the 150-160s.  Given adenosine 6 mg with definitive P waves noted for which sinus tachycardia was thought to be more likely.  TSH was noted to be within normal limits.  Patient reported that he had not been eating or drinking anything else besides alcohol during this time.  Tachycardia possibly related with patient's alcohol  use and/or dehydration as noted to have elevated BUN to creatinine ratio.  Heart rates  seemed to improve with IV fluids and Ativan.  Last CIWA was noted to be 9 -Admit to a progressive bed -CIWA protocols with Ativan scheduled and as needed -Thiamine, folate, multivitamin -Transitions of care consult -Normal saline IV fluids  Chest pain  elevated troponin cardiomegaly: Acute.  Patient was noted to have concern for chest pain.  High-sensitivity troponins were initially 16, but repeat 31.  On physical exam patient does not show any significant signs of fluid overload.  Chest x-ray did note mild cardiomegaly.  Suspect possibly secondary to tachycardia. -Admit to a progressive bed -Check UDS, lipid  panel -Continue to trend cardiac troponins -Check repeat EKG as needed for chest pain -Check echocardiogram -Consider need to formally consult cardiology pending on echocardiogram  SIRS leukocytosis: Acute.   Patient was noted to be tachycardic and tachypneic meeting SIRS criteria.  WBC was also noted to be elevated at 11.4.  No clear source appreciated at this time. -Follow-up lactic acid -Follow-up urinalysis -Recheck CBC tomorrow morning  Dyspnea: Patient reported complaints of feeling short of breath.  Not having any significant cough or recent sick contacts.  O2 saturations never were noted to be low, but it seems he was placed on oxygen for comfort.  D-dimer was elevated, but there are no signs of a embolus on CT scan.  Patient denies any history of tobacco use.  Unclear cause of symptoms at this time, but possibly related to electrolyte abnormality. -Continue nasal cannula oxygen needed  Metabolic acidosis with elevated anion gap: Patient noted to have initial CO2 14 with anion gap of 21.  Unclear if symptoms secondary to possible lactic acidosis versus alcoholic ketoacidosis. -Continue to monitor BMP for gap closure -Will consider need of placing patient on insulin drip  Elevated AST and hyperbilirubinemia: Acute.  AST elevated at 50 with ALT 31.  Ratio greater than 1.5 that is consistent with alcohol abuse.  Total bilirubin was mildly elevated at 1.3. -Recheck CMP tomorrow morning  Hyponatremia: Sodium just mildly low at 134.  Possibly secondary to alcohol use and/or dehydration. -Continue to monitor  Hypertensive urgency: Acute.  On admission patient blood pressures elevated up to 181/98.  Home medication regimen includes lisinopril 20 mg nightly. -Continue lisinopril -Hydralazine IV as needed for systolic blood pressure greater than 180  GERD history of GI bleed: Patient previously had been on omeprazole, but was not currently taking any medications.   -Start Protonix given  patient appears to be under stress and prior history of GI bleed  Pulmonary nodule: Findings noted on CT imaging noted pulmonary nodules of which the one on the left had finding possibly consistent with resolved TB infection.  DVT prophylaxis: Lovenox Code Status: Full Family Communication: At bedside Disposition Plan: Home Consults called: None Admission status: Observation  Clydie Braun MD [facility]   If 7PM-7AM, please contact night-coverage   08/11/2021, 8:05 AM

## 2021-08-11 NOTE — Progress Notes (Signed)
  Echocardiogram 2D Echocardiogram has been performed.  Carl Briggs 08/11/2021, 5:34 PM
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
## 2021-08-12 DIAGNOSIS — E876 Hypokalemia: Secondary | ICD-10-CM

## 2021-08-12 DIAGNOSIS — R0789 Other chest pain: Secondary | ICD-10-CM

## 2021-08-12 LAB — CBC WITH DIFFERENTIAL/PLATELET
Abs Immature Granulocytes: 0.01 10*3/uL (ref 0.00–0.07)
Basophils Absolute: 0 10*3/uL (ref 0.0–0.1)
Basophils Relative: 0 %
Eosinophils Absolute: 0.1 10*3/uL (ref 0.0–0.5)
Eosinophils Relative: 1 %
HCT: 42.3 % (ref 39.0–52.0)
Hemoglobin: 14.4 g/dL (ref 13.0–17.0)
Immature Granulocytes: 0 %
Lymphocytes Relative: 23 %
Lymphs Abs: 1.4 10*3/uL (ref 0.7–4.0)
MCH: 30.8 pg (ref 26.0–34.0)
MCHC: 34 g/dL (ref 30.0–36.0)
MCV: 90.6 fL (ref 80.0–100.0)
Monocytes Absolute: 0.4 10*3/uL (ref 0.1–1.0)
Monocytes Relative: 7 %
Neutro Abs: 4.3 10*3/uL (ref 1.7–7.7)
Neutrophils Relative %: 69 %
Platelets: 140 10*3/uL — ABNORMAL LOW (ref 150–400)
RBC: 4.67 MIL/uL (ref 4.22–5.81)
RDW: 14.4 % (ref 11.5–15.5)
WBC: 6.2 10*3/uL (ref 4.0–10.5)
nRBC: 0 % (ref 0.0–0.2)

## 2021-08-12 LAB — COMPREHENSIVE METABOLIC PANEL
ALT: 27 U/L (ref 0–44)
AST: 44 U/L — ABNORMAL HIGH (ref 15–41)
Albumin: 3.2 g/dL — ABNORMAL LOW (ref 3.5–5.0)
Alkaline Phosphatase: 79 U/L (ref 38–126)
Anion gap: 9 (ref 5–15)
BUN: 9 mg/dL (ref 6–20)
CO2: 23 mmol/L (ref 22–32)
Calcium: 8.2 mg/dL — ABNORMAL LOW (ref 8.9–10.3)
Chloride: 103 mmol/L (ref 98–111)
Creatinine, Ser: 0.59 mg/dL — ABNORMAL LOW (ref 0.61–1.24)
GFR, Estimated: >60 mL/min (ref >60)
Glucose, Bld: 140 mg/dL — ABNORMAL HIGH (ref 70–99)
Potassium: 3.4 mmol/L — ABNORMAL LOW (ref 3.5–5.1)
Sodium: 135 mmol/L (ref 135–145)
Total Bilirubin: 1.6 mg/dL — ABNORMAL HIGH (ref 0.3–1.2)
Total Protein: 6.4 g/dL — ABNORMAL LOW (ref 6.5–8.1)

## 2021-08-12 LAB — PHOSPHORUS: Phosphorus: 2.9 mg/dL (ref 2.5–4.6)

## 2021-08-12 MED ORDER — KETOROLAC TROMETHAMINE 30 MG/ML IJ SOLN
30.0000 mg | Freq: Once | INTRAMUSCULAR | Status: AC
  Administered 2021-08-12: 30 mg via INTRAVENOUS
  Filled 2021-08-12: qty 1

## 2021-08-12 MED ORDER — MAGNESIUM HYDROXIDE 400 MG/5ML PO SUSP: 30.0000 mL | Freq: Every day | ORAL | Status: DC | PRN

## 2021-08-12 MED ORDER — NITROGLYCERIN 0.4 MG SL SUBL
SUBLINGUAL_TABLET | SUBLINGUAL | Status: AC
  Filled 2021-08-12: qty 1

## 2021-08-12 MED ORDER — POTASSIUM CHLORIDE CRYS ER 20 MEQ PO TBCR
40.0000 meq | EXTENDED_RELEASE_TABLET | Freq: Once | ORAL | Status: AC
  Administered 2021-08-12: 40 meq via ORAL
  Filled 2021-08-12: qty 2

## 2021-08-12 MED ORDER — METOPROLOL TARTRATE 5 MG/5ML IV SOLN
5.0000 mg | Freq: Once | INTRAVENOUS | Status: AC
  Administered 2021-08-12: 5 mg via INTRAVENOUS
  Filled 2021-08-12: qty 5

## 2021-08-12 NOTE — Progress Notes (Addendum)
PROGRESS NOTE    Carl Briggs  EAV:[id number] DOB: [birth year] DOA: 08/11/2021 PCP: Bing Neighbors, FNP    Chief Complaint  Patient presents with   Chest Pain   Shortness of Breath    Brief Narrative:  Carl Briggs is a 50 y.o. Spanish-speaking male with medical history significant of hypertension who presented with complaints of feeling bad over the last 2-3 days.  History is obtained with use of Spanish interpreter services.  Over the last week patient had been drinking one 1 L bottle of tequila per day on average with his last drink reported 2 days ago.  Unable to get patient to explain why he started back drinking, but previously had quit for over 15 years or more.  During this last week he had only been drinking alcohol, and not eating or drinking anything else.   He has significant tachycardia, tachypnea on presentation, he appears significant dehydrated, he is admitted to the hospital  Subjective:  He is on hydration, reports feeling better, less chest pain , less headache, less dizziness Wife at bedside He does not speak [place], son helped translate over the phone He reports has no insurance, would like assistance   Assessment & Plan:   Principal Problem:   Alcohol withdrawal (HCC) Active Problems:   Chest pain   SIRS (systemic inflammatory response syndrome) (HCC)   Metabolic acidosis with increased anion gap and accumulation of organic acids   Hyponatremia   Hyperbilirubinemia   Elevated AST (SGOT)   Dyspnea   Tachycardia/tachypnea -Likely from alcohol withdrawal/significant dehydration -CTA no PE -Improving on hydration, continue  Chest pain High-sensitivity troponin mild and flat Echocardiogram with preserved LVEF, no wall motion abnormality EKG no acute ST-T changes Chest pain has improved, suspect from uncontrolled hypertension and tachycardia on presentation Cannot rule out gastritis/esophagitis /GERD from binge drinking, continue  on PPI, no sign of gi bleed ( he does has h/o gi bleed)  Hypertension urgency On admission patient blood pressures elevated up to 181/98 Continue lisinopril, as needed hydralazine -Improving, continue adjust blood pressure medication as needed  Hypokalemia/hyponatremia, replace K, continue hydration  Mild elevated LFT, hepatitis panel negative, likely from alcohol Will check CK  Impaired fasting blood glucose Check A1c  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 No follow-up needed if patient is low-risk. Non-contrast chest CT can be considered in 12 months if patient is high-risk.  Nutritional Assessment: The patient's BMI is: Body mass index is 30.1 kg/m..  Substance abuse Alcohol cessation education provided UDS also positive for amphetamines    Unresulted Labs (From admission, onward)     Start     Ordered   08/13/21 0500  CBC  Tomorrow morning,   R        08/12/21 1846   08/13/21 0500  Comprehensive metabolic panel  Tomorrow morning,   R        08/12/21 1846   08/13/21 0500  Magnesium  Tomorrow morning,   R        08/12/21 1846   08/13/21 0500  Hemoglobin A1c  Tomorrow morning,   R        08/12/21 1847   08/13/21 0500  CK  Tomorrow morning,   R        08/12/21 1848              DVT prophylaxis: enoxaparin (LOVENOX) injection  40 mg Start: 08/11/21 1000   Code Status: Full Family Communication: Wife at bedside, son over the phone Disposition:   Status is: Observation  Dispo: The patient is from: Home              Anticipated d/c is to: Home              Anticipated d/c date is: On 8/22, need transitional care assistant, patient has no insurance                Consultants:  None  Procedures:  None  Antimicrobials:    None    Objective: Vitals:   08/12/21 0743 08/12/21 0951 08/12/21 1130 08/12/21 1621  BP:  (!) 151/96 (!) 151/96 (!) 146/87 (!) 162/97  Pulse: 100 100 96 91  Resp: 18  16 15   Temp: 98.2 F (36.8 C)  98.6 F (37 C) 98.5 F (36.9 C)  TempSrc:   Oral Oral  SpO2: 95%  98% 98%  Weight:      Height:        Intake/Output Summary (Last 24 hours) at 08/12/2021 1848 Last data filed at 08/12/2021 1100 Gross per 24 hour  Intake 240 ml  Output 850 ml  Net -610 ml   Filed Weights   08/11/21 0240 08/11/21 1300 08/12/21 0326  Weight: 73.5 kg 72.6 kg 72.3 kg    Examination:  General exam: calm, NAD, appears dehydrated Respiratory system: Clear to auscultation. Respiratory effort normal. Cardiovascular system: Slightly tachycardic, No pedal edema. Gastrointestinal system: Abdomen is nondistended, soft and nontender.  Normal bowel sounds heard. Central nervous system: Alert and oriented. No focal neurological deficits. Extremities: No edema Skin: No rashes, lesions or ulcers Psychiatry: Judgement and insight appear normal. Mood & affect appropriate.     Data Reviewed: I have personally reviewed following labs and imaging studies  CBC: Recent Labs  Lab 08/11/21 0403 08/12/21 0343  WBC 11.7* 6.2  NEUTROABS 9.6* 4.3  HGB 15.9 14.4  HCT 46.2 42.3  MCV 89.9 90.6  PLT 249 140*    Basic Metabolic Panel: Recent Labs  Lab 08/11/21 0403 08/11/21 0918 08/11/21 1211 08/11/21 1623 08/12/21 0343  NA 134*  --  138 137 135  K 3.7  --  3.4* 3.2* 3.4*  CL 96*  --  105 105 103  CO2 14*  --  24 25 23   GLUCOSE 159*  --  144* 136* 140*  BUN 20  --  14 10 9   CREATININE 0.81  --  0.61 0.55* 0.59*  CALCIUM 9.0  --  8.4* 8.0* 8.2*  MG  --  2.0  --   --   --   PHOS  --  2.2*  --   --  2.9    GFR: Estimated Creatinine Clearance: 93.2 mL/min (A) (by C-G formula based on SCr of 0.59 mg/dL (L)).  Liver Function Tests: Recent Labs  Lab 08/11/21 0403 08/12/21 0343  AST 50* 44*  ALT 31 27  ALKPHOS 98 79  BILITOT 1.3* 1.6*  PROT 8.0 6.4*  ALBUMIN 4.3 3.2*    CBG: No  results for input(s): GLUCAP in the last 168 hours.   Recent Results (from the past 240 hour(s))  SARS CORONAVIRUS 2 (TAT 6-24 HRS) Nasopharyngeal Nasopharyngeal Swab     Status: None   Collection Time: 08/11/21 12:11 PM   Specimen: Nasopharyngeal Swab  Result Value Ref Range Status   SARS Coronavirus 2 NEGATIVE NEGATIVE Final    Comment: (NOTE) SARS-CoV-2  target nucleic acids are NOT DETECTED.  The SARS-CoV-2 RNA is generally detectable in upper and lower respiratory specimens during the acute phase of infection. Negative results do not preclude SARS-CoV-2 infection, do not rule out co-infections with other pathogens, and should not be used as the sole basis for treatment or other patient management decisions. Negative results must be combined with clinical observations, patient history, and epidemiological information. The expected result is Negative.  Fact Sheet for Patients: HairSlick.no  Fact Sheet for Healthcare Providers: quierodirigir.com  This test is not yet approved or cleared by the [place] FDA and  has been authorized for detection and/or diagnosis of SARS-CoV-2 by FDA under an Emergency Use Authorization (EUA). This EUA will remain  in effect (meaning this test can be used) for the duration of the COVID-19 declaration under Se ction 564(b)(1) of the Act, 21 U.S.C. section 360bbb-3(b)(1), unless the authorization is terminated or revoked sooner.  Performed at [facility], [address], [place], [place] [address]          Radiology Studies: CT HEAD WO CONTRAST ( )  Result Date: 08/11/2021 CLINICAL DATA:  Head trauma. EXAM: CT HEAD WITHOUT CONTRAST TECHNIQUE: Contiguous axial images were obtained from the base of the skull through the vertex without intravenous contrast. COMPARISON:  None. FINDINGS: Brain: No evidence of acute infarction, hemorrhage, hydrocephalus, extra-axial collection or  mass lesion/mass effect. Vascular: No hyperdense vessel or unexpected calcification. Skull: Normal. Negative for fracture or focal lesion. Sinuses/Orbits: Small RIGHT mastoid effusion. There is mild mucosal thickening of the paranasal sinuses. No evidence for acute sinus wall fracture. Other: LEFT frontal1 scalp edema not associated with underlying fracture. IMPRESSION: No acute intracranial abnormality. LEFT frontal scalp edema/ hematoma not associated with underlying fracture. Electronically Signed   By: Norva Pavlov M.D.   On: 08/11/2021 15:46   CT Angio Chest PE W and/or Wo Contrast  Result Date: 08/11/2021 CLINICAL DATA:  Chest pain, shortness of breath. High probability suspicion for PE. EXAM: CT ANGIOGRAPHY CHEST WITH CONTRAST TECHNIQUE: Multidetector CT imaging of the chest was performed using the standard protocol during bolus administration of intravenous contrast. Multiplanar CT image reconstructions and MIPs were obtained to evaluate the vascular anatomy. CONTRAST:  OMNIPAQUE IOHEXOL 350 MG/ML SOLN COMPARISON:  CT abdomen/pelvis 09/21/2020 FINDINGS: Cardiovascular: Satisfactory opacification of the pulmonary arteries to the segmental level. No evidence of pulmonary embolism. Cardiomegaly. No pericardial effusion. Mediastinum/Nodes: Partially peripherally calcified lymph node in the AP window measures up to 1.2 cm in short axis. No other lymphadenopathy or mediastinal mass identified. Unremarkable appearance of the thyroid gland and esophagus. Lungs/Pleura: Approximately 1.1 x 1.0 cm pulmonary nodule within internal mineralization/calcification in the periphery of the left upper lobe (image 49 series 7). 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 5 mm pulmonary nodule in the periphery of the right middle lobe (image 60 series 7). Mild dependent atelectasis. No focal airspace infiltrate, pulmonary  edema, pleural effusion or pneumothorax. Upper Abdomen: Diffuse low attenuation of the hepatic parenchyma consistent with hepatic steatosis. Stones are present within the gallbladder. No biliary ductal dilatation. Interval collapse of previously identified cyst arising from the posterior aspect of the right kidney. Musculoskeletal: No acute fracture or aggressive appearing lytic or blastic osseous lesion. Review of the MIP images confirms the above findings. IMPRESSION: 1. Negative for acute pulmonary embolus, pneumonia or other acute cardiopulmonary process. 2. Mild cardiomegaly. Clinical symptoms of chest pain and shortness  of breath may be cardiac in nature. 3. 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 4. Small 5 mm right middle lobe pulmonary nodule noted incidentally. No follow-up needed if patient is low-risk. Non-contrast chest CT can be considered in 12 months if patient is high-risk. This recommendation follows the consensus statement: Guidelines for Management of Incidental Pulmonary Nodules Detected on CT Images: From the Fleischner Society 2017; Radiology 2017; 284:228-243. 5. Hepatic steatosis. 6. Cholelithiasis. 7. Interval collapse/resolution of previously identified right interpolar renal cyst. Electronically Signed   By: Malachy Moan M.D.   On: 08/11/2021 07:28   ECHOCARDIOGRAM COMPLETE  Result Date: 08/11/2021    ECHOCARDIOGRAM REPORT   Patient Name:   RITCHARD PARAGAS Date of Exam: 08/11/2021 Medical Rec #:  [id number]             Height:       61.0 in Accession #:    [id number]            Weight:       160.0 lb Date of Birth:  February 15, 1971             BSA:          1.718 m Patient Age:    50 years              BP:           136/111 mmHg Patient Gender: M                     HR:           91 bpm. Exam Location:  Inpatient Procedure: 2D Echo,  Cardiac Doppler and Color Doppler Indications:    Chest pain  History:        Patient has no prior history of Echocardiogram examinations.                 Risk Factors:Hypertension.  Sonographer:    Ross Ludwig RDCS (AE) Referring Phys: [id number] [facility] A SMITH  Sonographer Comments: Suboptimal subcostal window. IMPRESSIONS  1. Left ventricular ejection fraction, by estimation, is 65 to 70%. The left ventricle has normal function. The left ventricle has no regional wall motion abnormalities. There is moderate left ventricular hypertrophy. Left ventricular diastolic parameters were normal.  2. Right ventricular systolic function is normal. The right ventricular size is normal. Tricuspid regurgitation signal is inadequate for assessing PA pressure.  3. The mitral valve is normal in structure. No evidence of mitral valve regurgitation. No evidence of mitral stenosis.  4. The aortic valve is tricuspid. Aortic valve regurgitation is not visualized. No aortic stenosis is present. FINDINGS  Left Ventricle: Left ventricular ejection fraction, by estimation, is 65 to 70%. The left ventricle has normal function. The left ventricle has no regional wall motion abnormalities. The left ventricular internal cavity size was normal in size. There is  moderate left ventricular hypertrophy. Left ventricular diastolic parameters were normal. Right Ventricle: The right ventricular size is normal. No increase in right ventricular wall thickness. Right ventricular systolic function is normal. Tricuspid regurgitation signal is inadequate for assessing PA pressure. Left Atrium: Left atrial size was normal in size. Right Atrium: Right atrial size was normal in size. Pericardium: There is no evidence of pericardial effusion. Mitral Valve: The mitral valve is normal in structure. No evidence of mitral valve regurgitation. No evidence of mitral valve stenosis. MV  peak gradient, 3.5 mmHg. The mean mitral valve gradient is 2.0 mmHg. Tricuspid  Valve: The tricuspid valve is normal in structure. Tricuspid valve regurgitation is trivial. Aortic Valve: The aortic valve is tricuspid. Aortic valve regurgitation is not visualized. No aortic stenosis is present. Aortic valve mean gradient measures 4.0 mmHg. Aortic valve peak gradient measures 8.2 mmHg. Aortic valve area, by VTI measures 3.28 cm. Pulmonic Valve: The pulmonic valve was not well visualized. Pulmonic valve regurgitation is not visualized. Aorta: The aortic root and ascending aorta are structurally normal, with no evidence of dilitation. IAS/Shunts: The interatrial septum was not well visualized.  LEFT VENTRICLE PLAX 2D LVIDd:         4.10 cm  Diastology LVIDs:         2.50 cm  LV e' medial:    9.90 cm/s LV PW:         1.80 cm  LV E/e' medial:  9.3 LV IVS:        1.60 cm  LV e' lateral:   12.90 cm/s LVOT diam:     2.30 cm  LV E/e' lateral: 7.2 LV SV:         84 LV SV Index:   49 LVOT Area:     4.15 cm  RIGHT VENTRICLE RV Basal diam:  3.10 cm RV S prime:     15.40 cm/s TAPSE (M-mode): 2.6 cm LEFT ATRIUM             Index       RIGHT ATRIUM           Index LA diam:        2.70 cm 1.57 cm/m  RA Area:     14.60 cm LA Vol (A2C):   36.4 ml 21.19 ml/m RA Volume:   36.90 ml  21.48 ml/m LA Vol (A4C):   42.6 ml 24.80 ml/m LA Biplane Vol: 41.2 ml 23.98 ml/m  AORTIC VALVE AV Area (Vmax):    2.90 cm AV Area (Vmean):   2.95 cm AV Area (VTI):     3.28 cm AV Vmax:           143.00 cm/s AV Vmean:          96.300 cm/s AV VTI:            0.256 m AV Peak Grad:      8.2 mmHg AV Mean Grad:      4.0 mmHg LVOT Vmax:         99.80 cm/s LVOT Vmean:        68.400 cm/s LVOT VTI:          0.202 m LVOT/AV VTI ratio: 0.79  AORTA Ao Root diam: 3.70 cm Ao Asc diam:  3.30 cm MITRAL VALVE MV Area (PHT): 4.21 cm    SHUNTS MV Area VTI:   4.34 cm    Systemic VTI:  0.20 m MV Peak grad:  3.5 mmHg    Systemic Diam: 2.30 cm MV Mean grad:  2.0 mmHg MV Vmax:       0.94 m/s MV Vmean:      69.7 cm/s MV Decel Time: 180 msec MV E  velocity: 92.50 cm/s MV A velocity: 94.80 cm/s MV E/A ratio:  0.98 Epifanio Lesches MD Electronically signed by Epifanio Lesches MD Signature Date/Time: 08/11/2021/8:19:06 PM    Final         Scheduled Meds:  enoxaparin (LOVENOX) injection  40 mg Subcutaneous Daily   folic acid  1 mg  Oral Daily   lisinopril  20 mg Oral QHS   LORazepam  0-4 mg Intravenous Q6H   Followed by   Melene Muller ON 08/13/2021] LORazepam  0-4 mg Intravenous Q12H   multivitamin with minerals  1 tablet Oral Daily   pantoprazole  40 mg Oral Daily   potassium chloride  40 mEq Oral Once   thiamine  100 mg Oral Daily   Or   thiamine  100 mg Intravenous Daily   Continuous Infusions:  sodium chloride 100 mL/hr at 08/11/21 2331     LOS: 0 days   Time spent: Greater than 50% of this time was spent in counseling, explanation of diagnosis, planning of further management, and coordination of care.   Voice Recognition Reubin [place] dictation system was used to create this note, attempts have been made to correct errors. Please contact the author with questions and/or clarifications.   Albertine Grates, MD PhD FACP [facility]  Available via Epic secure chat 7am-7pm for nonurgent issues Please page for urgent issues To page the attending provider between 7A-7P or the covering provider during after hours 7P-7A, please log into the web site [email or web address] and access using universal [place] password for that web site. If you do not have the password, please call the hospital operator.    08/12/2021, 6:48 PM

## 2021-08-12 NOTE — Progress Notes (Signed)
Pt c/o chest pain, headache and states he is seeing things floating in the air. BP elevated. CIWA 9. Pt given PRN hydralazine, Noro and ativan. EKG obtained and MD notified. MD ordered 5mg  IV metoprolol, and toradol. Given to patient. Chest pain resolved but headache and visual changes persist. CIWA 8 at hour recheck. Additional dose of ativan given   Pt states headache is better.

## 2021-08-12 NOTE — Plan of Care (Signed)

## 2021-08-12 NOTE — Progress Notes (Signed)
[facility] night shift PCU coverage note.  The nursing staff reported that the patient was having headache that did not respond to acetaminophen and hypertension of 162/96 mmHg.  Toradol 30 mg IVP x1 and metoprolol 5 mg IVP x1 ordered.  The staff will continue to monitor the patient's blood pressure.  Sanda Klein, MD.

## 2021-08-13 ENCOUNTER — Other Ambulatory Visit (HOSPITAL_COMMUNITY): Payer: Self-pay

## 2021-08-13 LAB — CBC
HCT: 40.2 % (ref 39.0–52.0)
Hemoglobin: 13.6 g/dL (ref 13.0–17.0)
MCH: 30.6 pg (ref 26.0–34.0)
MCHC: 33.8 g/dL (ref 30.0–36.0)
MCV: 90.5 fL (ref 80.0–100.0)
Platelets: 175 10*3/uL (ref 150–400)
RBC: 4.44 MIL/uL (ref 4.22–5.81)
RDW: 13.9 % (ref 11.5–15.5)
WBC: 5.5 10*3/uL (ref 4.0–10.5)
nRBC: 0 % (ref 0.0–0.2)

## 2021-08-13 LAB — COMPREHENSIVE METABOLIC PANEL
ALT: 40 U/L (ref 0–44)
AST: 50 U/L — ABNORMAL HIGH (ref 15–41)
Albumin: 2.9 g/dL — ABNORMAL LOW (ref 3.5–5.0)
Alkaline Phosphatase: 72 U/L (ref 38–126)
Anion gap: 6 (ref 5–15)
BUN: 9 mg/dL (ref 6–20)
CO2: 24 mmol/L (ref 22–32)
Calcium: 8 mg/dL — ABNORMAL LOW (ref 8.9–10.3)
Chloride: 107 mmol/L (ref 98–111)
Creatinine, Ser: 0.55 mg/dL — ABNORMAL LOW (ref 0.61–1.24)
GFR, Estimated: >60 mL/min (ref >60)
Glucose, Bld: 123 mg/dL — ABNORMAL HIGH (ref 70–99)
Potassium: 3.6 mmol/L (ref 3.5–5.1)
Sodium: 137 mmol/L (ref 135–145)
Total Bilirubin: 0.8 mg/dL (ref 0.3–1.2)
Total Protein: 6 g/dL — ABNORMAL LOW (ref 6.5–8.1)

## 2021-08-13 LAB — HEMOGLOBIN A1C
Hgb A1c MFr Bld: 5.8 % — ABNORMAL HIGH (ref 4.8–5.6)
Mean Plasma Glucose: 119.76 mg/dL

## 2021-08-13 LAB — MAGNESIUM: Magnesium: 2 mg/dL (ref 1.7–2.4)

## 2021-08-13 LAB — CK: Total CK: 295 U/L (ref 49–397)

## 2021-08-13 MED ORDER — LISINOPRIL 20 MG PO TABS
20.0000 mg | ORAL_TABLET | Freq: Every day | ORAL | 0 refills | Status: DC
Start: 2021-08-13 — End: 2021-12-24
  Filled 2021-08-13: qty 30, 30d supply, fill #0

## 2021-08-13 MED ORDER — OMEPRAZOLE 20 MG PO CPDR
20.0000 mg | DELAYED_RELEASE_CAPSULE | Freq: Every day | ORAL | 0 refills | Status: DC
  Filled 2021-08-13: qty 30, 30d supply, fill #0

## 2021-08-13 MED ORDER — BLOOD PRESSURE KIT
1.0000 | PACK | Freq: Three times a day (TID) | 0 refills | Status: DC
  Filled 2021-08-13: qty 1, fill #0

## 2021-08-13 MED ORDER — THIAMINE HCL 100 MG PO TABS
100.0000 mg | ORAL_TABLET | Freq: Every day | ORAL | 0 refills | Status: DC
  Filled 2021-08-13: qty 30, 30d supply, fill #0

## 2021-08-13 MED ORDER — AMLODIPINE BESYLATE 10 MG PO TABS
10.0000 mg | ORAL_TABLET | Freq: Every day | ORAL | 0 refills | Status: DC
  Filled 2021-08-13: qty 30, 30d supply, fill #0

## 2021-08-13 MED ORDER — FOLIC ACID 1 MG PO TABS
1.0000 mg | ORAL_TABLET | Freq: Every day | ORAL | 0 refills | Status: DC
  Filled 2021-08-13: qty 30, 30d supply, fill #0

## 2021-08-13 MED ORDER — FOLIC ACID 1 MG PO TABS
1.0000 mg | ORAL_TABLET | Freq: Every day | ORAL | 0 refills | Status: DC
Start: 2021-08-13 — End: 2021-08-13

## 2021-08-13 MED ORDER — LISINOPRIL 20 MG PO TABS: 20.0000 mg | ORAL_TABLET | Freq: Every day | ORAL | 0 refills | Status: DC

## 2021-08-13 MED ORDER — BLOOD PRESSURE KIT: 1.0000 | PACK | Freq: Three times a day (TID) | 0 refills | Status: DC

## 2021-08-13 MED ORDER — AMLODIPINE BESYLATE 10 MG PO TABS: 10.0000 mg | ORAL_TABLET | Freq: Every day | ORAL | 0 refills | Status: DC

## 2021-08-13 MED ORDER — THIAMINE HCL 100 MG PO TABS: 100.0000 mg | ORAL_TABLET | Freq: Every day | ORAL | 0 refills | Status: DC

## 2021-08-13 NOTE — TOC Transition Note (Signed)
Transition of Care [facility]) - CM/SW Discharge Note   Patient Details  Name: Carl Briggs MRN: [id number] Date of Birth: [birth year]  [facility]) CM/SW Contact:  Leone Haven, RN Phone Number: 08/13/2021, 9:28 AM   Clinical Narrative:    Patient is for dc today, wife is at the bedside, she will transport him home.  TOC filled medications, price is 11.00 and the patient can pay that amount.  NCM gave him the SA resources.     Final next level of care: Home/Self Care Barriers to Discharge: No Barriers Identified   Patient Goals and CMS Choice        Discharge Placement                       Discharge Plan and Services                                     Social Determinants of Health (SDOH) Interventions     Readmission Risk Interventions No flowsheet data found.

## 2021-08-13 NOTE — Plan of Care (Signed)
  Problem: Education: Goal: Knowledge of General Education information will improve Description Including pain rating scale, medication(s)/side effects and non-pharmacologic comfort measures Outcome: Progressing   Problem: Health Behavior/Discharge Planning: Goal: Ability to manage health-related needs will improve Outcome: Progressing   

## 2021-08-13 NOTE — Discharge Summary (Signed)
Physician Discharge Summary  Carl Briggs GHW:[id number] DOB: 1971/11/02 DOA: 08/11/2021  PCP: Carl Jun, FNP  Admit date: 08/11/2021 Discharge date: 08/13/2021  Admitted From: Home Disposition:  Home  Discharge Condition:Stable CODE STATUS:FULL Diet recommendation: Heart Healthy    Brief/Interim Summary: Carl Briggs is a 50 y.o. Spanish-speaking male with medical history significant of hypertension who presented with complaints of feeling bad over the last 2-3 days.   Over the last week patient had been drinking one 1 L bottle of tequila per day on average with his last drink reported 2 days ago.  Unable to get patient to explain why he started back drinking, but previously had quit for over 15 years or more.  During this last week he had only been drinking alcohol, and not eating or drinking anything else.   He had significant tachycardia, tachypnea on presentation, he appeared significantly dehydrated.  He was a started on IV fluids, CIWA protocol started.  He started on thiamine folic, acid.  Hospital course was remarkable for persistent hypotension.  His heart rate is much better now.  Currently he is not in withdrawal.  He is medically stable for discharge to home with antihypertensives.  He has been strongly recommended to quit drinking.  Following problems were addressed during his hospitalization:  Tachycardia/tachypnea -Likely from alcohol withdrawal/significant dehydration -CTA no PE -Improved o   Chest pain High-sensitivity troponin mild and flat Echocardiogram with preserved LVEF, no wall motion abnormality EKG no acute ST-T changes Chest pain has improved, suspect from uncontrolled hypertension and tachycardia on presentation Cannot rule out gastritis/esophagitis /GERD from binge drinking, continue on PPI, no sign of gi bleed ( he does has h/o gi bleed)   Hypertension urgency On admission patient blood pressures elevated up to 181/98 Continue  lisinopril,added amlodipine   Mild elevated LFT: hepatitis panel negative, likely from alcohol  Hyperlipidemia: LDL of 126.  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 No follow-up needed if patient is low-risk. Non-contrast chest CT can be considered in 12 months if patient is high-risk.   Nutritional Assessment: The patient's BMI is: Body mass index is 30.1 kg/m..   Substance abuse Alcohol cessation education provided UDS also positive for amphetamines     Discharge Diagnoses:  Principal Problem:   Alcohol withdrawal ([place]) Active Problems:   Chest pain   SIRS (systemic inflammatory response syndrome) (HCC)   Metabolic acidosis with increased anion gap and accumulation of organic acids   Hyponatremia   Hyperbilirubinemia   Elevated AST (SGOT)   Dyspnea    Discharge Instructions  Discharge Instructions     Diet - low sodium heart healthy   Complete by: As directed    Discharge instructions   Complete by: As directed    1)Please monitor blood pressure at home.  Take prescribed medications as instructed 2)Please follow-up with your PCP in a week 3)Please quit alcohol   Increase activity slowly   Complete by: As directed       Allergies as of 08/13/2021   No Known Allergies      Medication List     TAKE these medications    amLODipine 10 MG tablet Commonly known as: NORVASC Take 1 tablet (10  mg total) by mouth daily.   Blood Pressure Kit 1 each by Does not apply route 3 (three) times daily.   folic acid 1 MG tablet Commonly known as: FOLVITE Take 1 tablet (1 mg total) by mouth daily.   lisinopril 20 MG tablet Commonly known as: ZESTRIL Take 1 tablet (20 mg total)  by mouth at bedtime.   omeprazole 20 MG capsule Commonly known as: PRILOSEC Take 1 capsule (20 mg total) by mouth daily.   thiamine 100 MG tablet Take 1 tablet (100 mg total) by mouth daily.        Follow-up Information     Carl Jun, FNP. Schedule an appointment as soon as possible for a visit in 1 week(s).   Specialty: Family Medicine Contact information: [place] [address] [phone]                No Known Allergies  Consultations: None   Procedures/Studies: CT HEAD WO CONTRAST (5MM)  Result Date: 08/11/2021 CLINICAL DATA:  Head trauma. EXAM: CT HEAD WITHOUT CONTRAST TECHNIQUE: Contiguous axial images were obtained from the base of the skull through the vertex without intravenous contrast. COMPARISON:  None. FINDINGS: Brain: No evidence of acute infarction, hemorrhage, hydrocephalus, extra-axial collection or mass lesion/mass effect. Vascular: No hyperdense vessel or unexpected calcification. Skull: Normal. Negative for fracture or focal lesion. Sinuses/Orbits: Small RIGHT mastoid effusion. There is mild mucosal thickening of the paranasal sinuses. No evidence for acute sinus wall fracture. Other: LEFT frontal1 scalp edema not associated with underlying fracture. IMPRESSION: No acute intracranial abnormality. LEFT frontal scalp edema/ hematoma not associated with underlying fracture. Electronically Signed   By: Nolon Nations M.D.   On: 08/11/2021 15:46   CT Angio Chest PE W and/or Wo Contrast  Result Date: 08/11/2021 CLINICAL DATA:  Chest pain, shortness of breath. High probability suspicion for PE. EXAM: CT ANGIOGRAPHY CHEST WITH CONTRAST TECHNIQUE: Multidetector CT imaging of the chest was performed using the standard protocol during bolus administration of intravenous contrast. Multiplanar CT image reconstructions and MIPs were obtained to evaluate the vascular anatomy. CONTRAST:  133m OMNIPAQUE IOHEXOL 350 MG/ML SOLN  COMPARISON:  CT abdomen/pelvis 09/21/2020 FINDINGS: Cardiovascular: Satisfactory opacification of the pulmonary arteries to the segmental level. No evidence of pulmonary embolism. Cardiomegaly. No pericardial effusion. Mediastinum/Nodes: Partially peripherally calcified lymph node in the AP window measures up to 1.2 cm in short axis. No other lymphadenopathy or mediastinal mass identified. Unremarkable appearance of the thyroid gland and esophagus. Lungs/Pleura: Approximately 1.1 x 1.0 cm pulmonary nodule within internal mineralization/calcification in the periphery of the left upper lobe (image 49 series 7). 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 5 mm pulmonary nodule in the periphery of the right middle lobe (image 60 series 7). Mild dependent atelectasis. No focal airspace infiltrate, pulmonary edema, pleural effusion or pneumothorax. Upper Abdomen: Diffuse low attenuation of the hepatic parenchyma consistent with hepatic steatosis. Stones are present within the gallbladder. No biliary ductal dilatation. Interval collapse of previously identified cyst arising from the posterior aspect of the right kidney. Musculoskeletal: No acute fracture or aggressive appearing lytic or blastic osseous lesion. Review of the MIP images confirms the above findings. IMPRESSION: 1. Negative for acute pulmonary embolus, pneumonia or other acute cardiopulmonary process. 2. Mild cardiomegaly. Clinical symptoms of chest pain and shortness of breath may be cardiac in nature. 3. 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 4. Small 5 mm right middle lobe pulmonary nodule noted incidentally. No follow-up needed if patient is low-risk. Non-contrast chest CT can be considered in 12 months if patient is  high-risk. This recommendation follows the consensus statement: Guidelines for Management of Incidental Pulmonary Nodules Detected on CT Images: From the Fleischner Society 2017; Radiology 2017; 284:228-243. 5. Hepatic steatosis. 6. Cholelithiasis. 7. Interval collapse/resolution of previously identified right interpolar renal cyst. Electronically Signed   By: Jacqulynn Cadet M.D.   On: 08/11/2021 07:28   ECHOCARDIOGRAM COMPLETE  Result Date: 08/11/2021    ECHOCARDIOGRAM REPORT   Patient Name:   Carl Briggs Date of Exam: 08/11/2021 Medical Rec #:  [id number]             Height:       61.0 in Accession #:    [id number]            Weight:       160.0 lb Date of Birth:  11/13/1971             BSA:          1.718 m Patient Age:    53 years              BP:           136/111 mmHg Patient Gender: M                     HR:           91 bpm. Exam Location:  Inpatient Procedure: 2D Echo, Cardiac Doppler and Color Doppler Indications:    Chest pain  History:        Patient has no prior history of Echocardiogram examinations.                 Risk Factors:Hypertension.  Sonographer:    Clayton Lefort RDCS (AE) Referring Phys: [id number] [facility] SMITH  Sonographer Comments: Suboptimal subcostal window. IMPRESSIONS  1. Left ventricular ejection fraction, by estimation, is 65 to 70%. The left ventricle has normal function. The left ventricle has no regional wall motion abnormalities. There is moderate left ventricular hypertrophy. Left ventricular diastolic parameters were normal.  2. Right ventricular systolic function is normal. The right ventricular size is normal. Tricuspid regurgitation signal is inadequate for assessing PA pressure.  3. The mitral valve is normal in structure. No evidence of mitral valve regurgitation. No evidence of mitral stenosis.  4. The aortic valve is tricuspid. Aortic valve regurgitation is not visualized. No aortic stenosis is present. FINDINGS  Left Ventricle: Left ventricular ejection  fraction, by estimation, is 65 to 70%. The left ventricle has normal function. The left ventricle has no regional wall motion abnormalities. The left ventricular internal cavity size was normal in size. There is  moderate left ventricular hypertrophy. Left ventricular diastolic parameters were normal. Right Ventricle: The right ventricular size is normal. No increase in right ventricular wall thickness. Right ventricular systolic function is normal. Tricuspid regurgitation signal is inadequate for assessing PA pressure. Left Atrium: Left atrial size was normal in size. Right Atrium: Right atrial size was normal in size. Pericardium: There is no evidence of pericardial effusion. Mitral Valve: The mitral valve is normal in structure. No evidence of mitral valve regurgitation. No evidence of mitral valve stenosis. MV peak gradient, 3.5 mmHg. The mean mitral valve gradient is 2.0 mmHg. Tricuspid Valve: The tricuspid valve is  normal in structure. Tricuspid valve regurgitation is trivial. Aortic Valve: The aortic valve is tricuspid. Aortic valve regurgitation is not visualized. No aortic stenosis is present. Aortic valve mean gradient measures 4.0 mmHg. Aortic valve peak gradient measures 8.2 mmHg. Aortic valve area, by VTI measures 3.28 cm. Pulmonic Valve: The pulmonic valve was not well visualized. Pulmonic valve regurgitation is not visualized. Aorta: The aortic root and ascending aorta are structurally normal, with no evidence of dilitation. IAS/Shunts: The interatrial septum was not well visualized.  LEFT VENTRICLE PLAX 2D LVIDd:         4.10 cm  Diastology LVIDs:         2.50 cm  LV e' medial:    9.90 cm/s LV PW:         1.80 cm  LV E/e' medial:  9.3 LV IVS:        1.60 cm  LV e' lateral:   12.90 cm/s LVOT diam:     2.30 cm  LV E/e' lateral: 7.2 LV SV:         84 LV SV Index:   49 LVOT Area:     4.15 cm  RIGHT VENTRICLE RV Basal diam:  3.10 cm RV S prime:     15.40 cm/s TAPSE (M-mode): 2.6 cm LEFT ATRIUM              Index       RIGHT ATRIUM           Index LA diam:        2.70 cm 1.57 cm/m  RA Area:     14.60 cm LA Vol (A2C):   36.4 ml 21.19 ml/m RA Volume:   36.90 ml  21.48 ml/m LA Vol (A4C):   42.6 ml 24.80 ml/m LA Biplane Vol: 41.2 ml 23.98 ml/m  AORTIC VALVE AV Area (Vmax):    2.90 cm AV Area (Vmean):   2.95 cm AV Area (VTI):     3.28 cm AV Vmax:           143.00 cm/s AV Vmean:          96.300 cm/s AV VTI:            0.256 m AV Peak Grad:      8.2 mmHg AV Mean Grad:      4.0 mmHg LVOT Vmax:         99.80 cm/s LVOT Vmean:        68.400 cm/s LVOT VTI:          0.202 m LVOT/AV VTI ratio: 0.79  AORTA Ao Root diam: 3.70 cm Ao Asc diam:  3.30 cm MITRAL VALVE MV Area (PHT): 4.21 cm    SHUNTS MV Area VTI:   4.34 cm    Systemic VTI:  0.20 m MV Peak grad:  3.5 mmHg    Systemic Diam: 2.30 cm MV Mean grad:  2.0 mmHg MV Vmax:       0.94 m/s MV Vmean:      69.7 cm/s MV Decel Time: 180 msec MV E velocity: 92.50 cm/s MV A velocity: 94.80 cm/s MV E/A ratio:  0.98 Oswaldo Milian MD Electronically signed by Oswaldo Milian MD Signature Date/Time: 08/11/2021/8:19:06 PM    Final       Subjective: Patient seen and examined at the bedside this morning.  Hemodynamically stable for discharge today.  Discharge planning discussed with the patient and wife at the bedside  Discharge Exam: Vitals:   08/13/21 0569 08/13/21 0750  BP: (!) 131/93 (!) 154/101  Pulse: 92 98  Resp: 19 20  Temp: 98.1 F (36.7 C) 98.2 F (36.8 C)  SpO2: 97% 96%   Vitals:   08/12/21 2200 08/12/21 2311 08/13/21 0418 08/13/21 0750  BP: (!) 151/97 (!) 140/93 (!) 131/93 (!) 154/101  Pulse: 78 100 92 98  Resp:  18 19 20   Temp:  98.9 F (37.2 C) 98.1 F (36.7 C) 98.2 F (36.8 C)  TempSrc:  Oral Oral Oral  SpO2:  96% 97% 96%  Weight:   72.3 kg   Height:        General: Pt is alert, awake, not in acute distress Cardiovascular: RRR, S1/S2 +, no rubs, no gallops Respiratory: CTA bilaterally, no wheezing, no rhonchi Abdominal: Soft,  NT, ND, bowel sounds + Extremities: no edema, no cyanosis    The results of significant diagnostics from this hospitalization (including imaging, microbiology, ancillary and laboratory) are listed below for reference.     Microbiology: Recent Results (from the past 240 hour(s))  SARS CORONAVIRUS 2 (TAT 6-24 HRS) Nasopharyngeal Nasopharyngeal Swab     Status: None   Collection Time: 08/11/21 12:11 PM   Specimen: Nasopharyngeal Swab  Result Value Ref Range Status   SARS Coronavirus 2 NEGATIVE NEGATIVE Final    Comment: (NOTE) SARS-CoV-2 target nucleic acids are NOT DETECTED.  The SARS-CoV-2 RNA is generally detectable in upper and lower respiratory specimens during the acute phase of infection. Negative results do not preclude SARS-CoV-2 infection, do not rule out co-infections with other pathogens, and should not be used as the sole basis for treatment or other patient management decisions. Negative results must be combined with clinical observations, patient history, and epidemiological information. The expected result is Negative.  Fact Sheet for Patients: SugarRoll.be  Fact Sheet for Healthcare Providers: https://www.woods-mathews.com/  This test is not yet approved or cleared by the [place] FDA and  has been authorized for detection and/or diagnosis of SARS-CoV-2 by FDA under an Emergency Use Authorization (EUA). This EUA will remain  in effect (meaning this test can be used) for the duration of the COVID-19 declaration under Se ction 564(b)(1) of the Act, 21 U.S.C. section 360bbb-3(b)(1), unless the authorization is terminated or revoked sooner.  Performed at [facility], [place] [address] [place], [place] [address]      Labs: BNP (last 3 results) Recent Labs    08/11/21 0403  BNP 50.2   Basic Metabolic Panel: Recent Labs  Lab 08/11/21 0403 08/11/21 0918 08/11/21 1211 08/11/21 1623 08/12/21 0343  08/13/21 0409  NA 134*  --  138 137 135 137  K 3.7  --  3.4* 3.2* 3.4* 3.6  CL 96*  --  105 105 103 107  CO2 14*  --  24 25 23 24   GLUCOSE 159*  --  144* 136* 140* 123*  BUN 20  --  14 10 9 9   CREATININE 0.81  --  0.61 0.55* 0.59* 0.55*  CALCIUM 9.0  --  8.4* 8.0* 8.2* 8.0*  MG  --  2.0  --   --   --  2.0  PHOS  --  2.2*  --   --  2.9  --    Liver Function Tests: Recent Labs  Lab 08/11/21 0403 08/12/21 0343 08/13/21 0409  AST 50* 44* 50*  ALT 31 27 40  ALKPHOS 98 79 72  BILITOT 1.3* 1.6* 0.8  PROT 8.0 6.4* 6.0*  ALBUMIN 4.3 3.2* 2.9*   No results  for input(s): LIPASE, AMYLASE in the last 168 hours. No results for input(s): AMMONIA in the last 168 hours. CBC: Recent Labs  Lab 08/11/21 0403 08/12/21 0343 08/13/21 0409  WBC 11.7* 6.2 5.5  NEUTROABS 9.6* 4.3  --   HGB 15.9 14.4 13.6  HCT 46.2 42.3 40.2  MCV 89.9 90.6 90.5  PLT 249 140* 175   Cardiac Enzymes: Recent Labs  Lab 08/13/21 0409  CKTOTAL 295   BNP: Invalid input(s): POCBNP CBG: No results for input(s): GLUCAP in the last 168 hours. D-Dimer Recent Labs    08/11/21 0403  DDIMER 0.70*   Hgb A1c Recent Labs    08/13/21 0409  HGBA1C 5.8*   Lipid Profile Recent Labs    08/11/21 0918  CHOL 209*  HDL 66  LDLCALC 126*  TRIG 84  CHOLHDL 3.2   Thyroid function studies Recent Labs    08/11/21 0250  TSH 1.263   Anemia work up No results for input(s): VITAMINB12, FOLATE, FERRITIN, TIBC, IRON, RETICCTPCT in the last 72 hours. Urinalysis    Component Value Date/Time   COLORURINE YELLOW 08/11/2021 0812   APPEARANCEUR CLEAR 08/11/2021 0812   LABSPEC >1.046 (H) 08/11/2021 0812   PHURINE 6.0 08/11/2021 0812   GLUCOSEU 150 (A) 08/11/2021 0812   HGBUR SMALL (A) 08/11/2021 0812   BILIRUBINUR NEGATIVE 08/11/2021 0812   KETONESUR 20 (A) 08/11/2021 0812   PROTEINUR 100 (A) 08/11/2021 0812   NITRITE NEGATIVE 08/11/2021 0812   LEUKOCYTESUR NEGATIVE 08/11/2021 0812   Sepsis Labs Invalid  input(s): PROCALCITONIN,  WBC,  LACTICIDVEN Microbiology Recent Results (from the past 240 hour(s))  SARS CORONAVIRUS 2 (TAT 6-24 HRS) Nasopharyngeal Nasopharyngeal Swab     Status: None   Collection Time: 08/11/21 12:11 PM   Specimen: Nasopharyngeal Swab  Result Value Ref Range Status   SARS Coronavirus 2 NEGATIVE NEGATIVE Final    Comment: (NOTE) SARS-CoV-2 target nucleic acids are NOT DETECTED.  The SARS-CoV-2 RNA is generally detectable in upper and lower respiratory specimens during the acute phase of infection. Negative results do not preclude SARS-CoV-2 infection, do not rule out co-infections with other pathogens, and should not be used as the sole basis for treatment or other patient management decisions. Negative results must be combined with clinical observations, patient history, and epidemiological information. The expected result is Negative.  Fact Sheet for Patients: SugarRoll.be  Fact Sheet for Healthcare Providers: https://www.woods-mathews.com/  This test is not yet approved or cleared by the [place] FDA and  has been authorized for detection and/or diagnosis of SARS-CoV-2 by FDA under an Emergency Use Authorization (EUA). This EUA will remain  in effect (meaning this test can be used) for the duration of the COVID-19 declaration under Se ction 564(b)(1) of the Act, 21 U.S.C. section 360bbb-3(b)(1), unless the authorization is terminated or revoked sooner.  Performed at [facility], [place] [address] [place], [place] [address]     Please note: You were cared for by a hospitalist during your hospital stay. Once you are discharged, your primary care physician will handle any further medical issues. Please note that NO REFILLS for any discharge medications will be authorized once you are discharged, as it is imperative that you return to your primary care physician (or establish a relationship with a primary  care physician if you do not have one) for your post hospital discharge needs so that they can reassess your need for medications and monitor your lab values.    Time coordinating discharge: 40 minutes  SIGNED:   Shelly Coss, MD  [facility] 08/13/2021, 8:46 AM Pager [phone]  If 7PM-7AM, please contact night-coverage [email or web address] Password [email or web address]

## 2021-08-14 ENCOUNTER — Telehealth: Payer: Self-pay

## 2021-08-14 NOTE — Telephone Encounter (Signed)
Transition Care Management Unsuccessful Follow-up Telephone Call  Date of discharge and from where:  08/13/2021, [facility]   Attempts:  1st Attempt  Reason for unsuccessful TCM follow-up call:  Unable to leave message , voicemail not set up on # [phone].  Patient has appointment with Dr Andrey Campanile @ [facility] 09/11/2021.

## 2021-08-15 ENCOUNTER — Telehealth: Payer: Self-pay

## 2021-08-15 NOTE — Telephone Encounter (Signed)
Transition Care Management Unsuccessful Follow-up Telephone Call  Call placed with assistance of Spanish Interpreter # [id number]/[employer]  Date of discharge and from where:  08/13/2021, [facility]   Attempts:  2nd Attempt  Reason for unsuccessful TCM follow-up call:  Unable to leave message voicemail not set up # [phone].  Patient has appointment with Dr Andrey Campanile @ [facility] 09/11/2021.

## 2021-08-16 ENCOUNTER — Telehealth: Payer: Self-pay

## 2021-08-16 NOTE — Telephone Encounter (Signed)
Transition Care Management Unsuccessful Follow-up Telephone Call  Call placed to patient with assistance of Spanish Interpreter Ceasar # [id number]/[employer]  Date of discharge and from where:  08/13/2021, [facility]  Attempts:  3rd Attempt  Reason for unsuccessful TCM follow-up call:  Unable to leave message - voicemail not set up # [phone].   Patient has appointment with Dr Andrey Campanile @ [facility] 09/11/2021.

## 2021-09-11 ENCOUNTER — Inpatient Hospital Stay: Payer: Self-pay | Admitting: Family Medicine

## 2021-10-01 IMAGING — US US ABDOMEN COMPLETE
1 series · 14 of 25 positions shown · non-contrast
Comparison: CT 11/07/2018

CLINICAL DATA: Right upper quadrant and mid abdominal pain

EXAM:
ABDOMEN ULTRASOUND COMPLETE

[Series 1: us abdomen complete · 0.19mm/px · 14 of 95 slices shown]
[im 1/95]
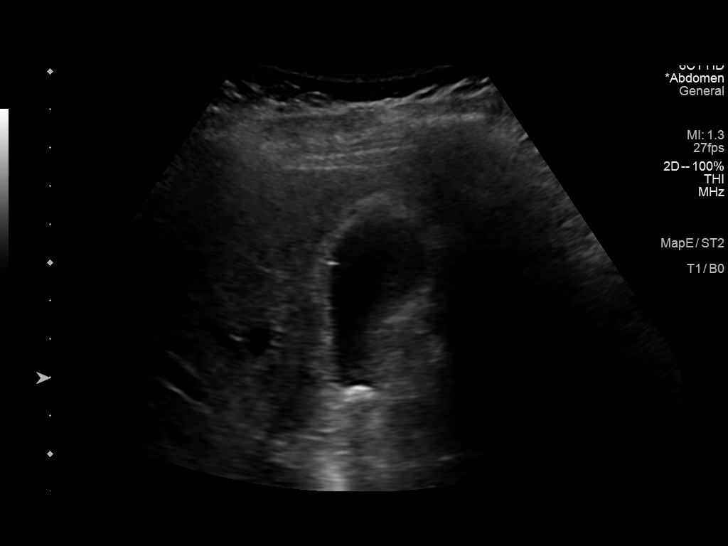
[im 8/95]
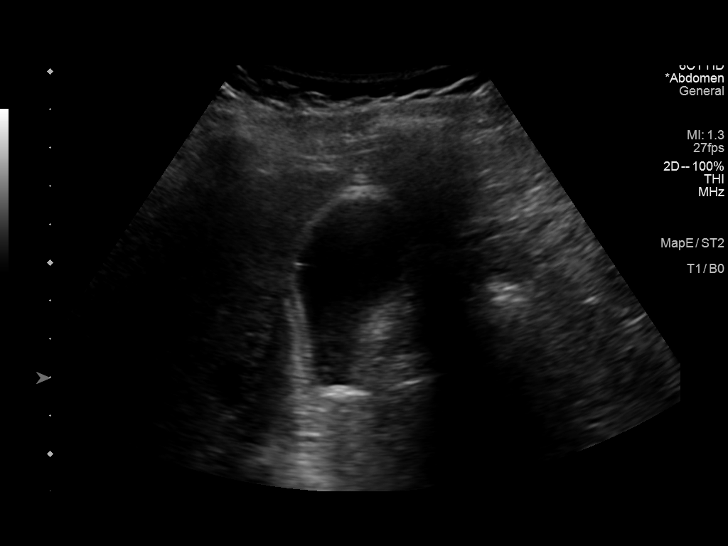
[im 16/95]
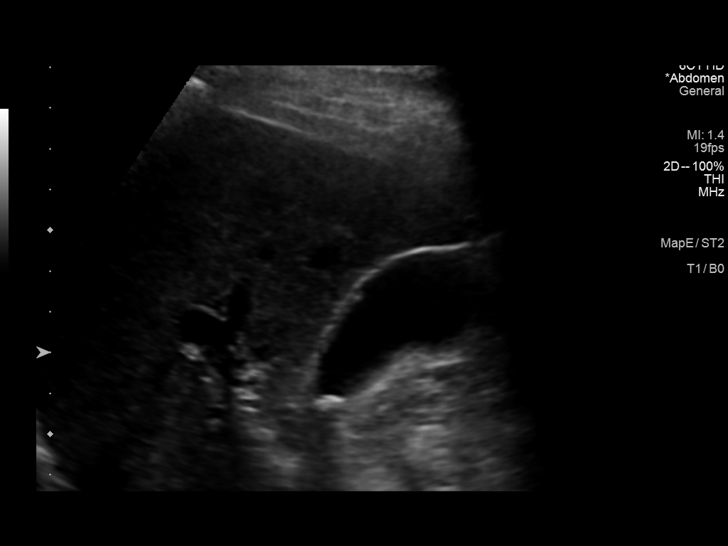
[im 24/95]
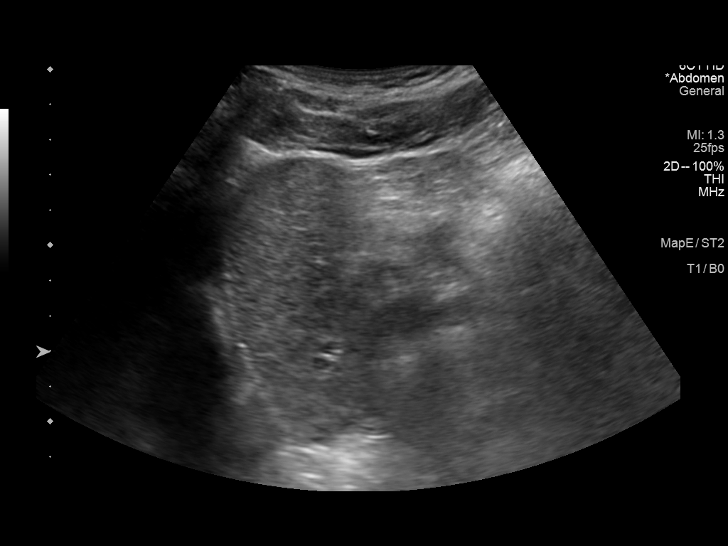
[im 32/95]
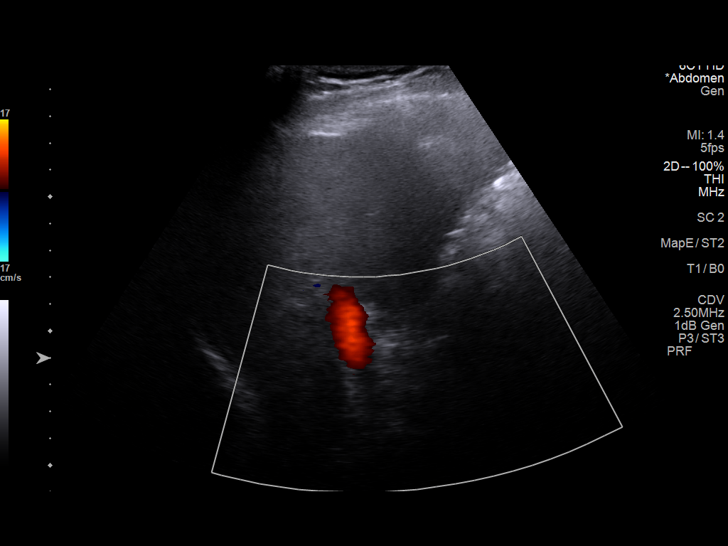
[im 36/95]
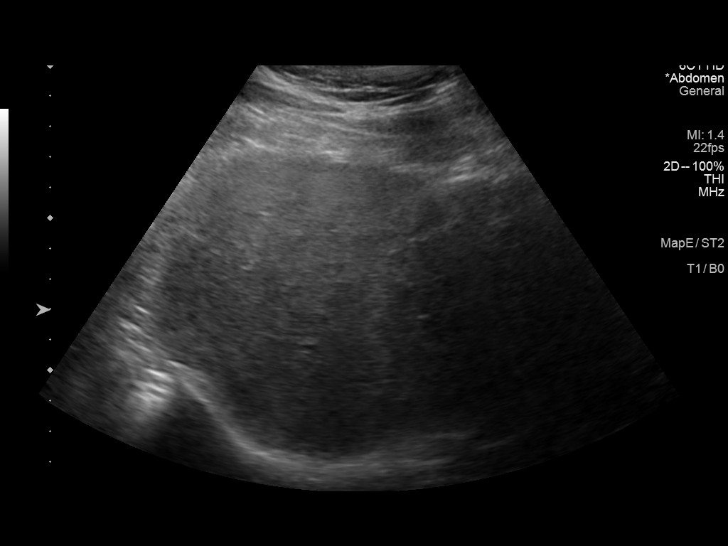
[im 44/95]
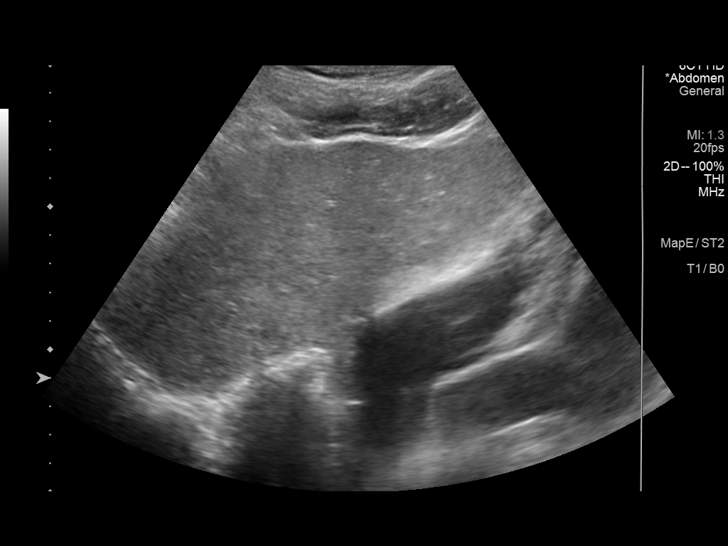
[im 51/95]
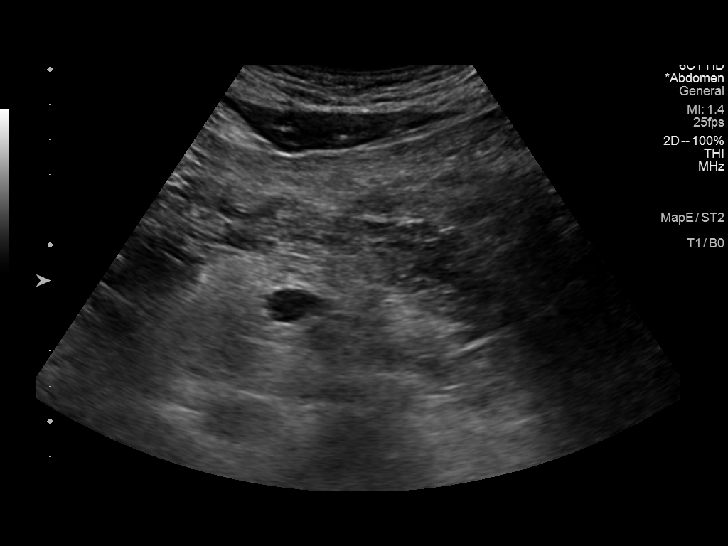
[im 59/95]
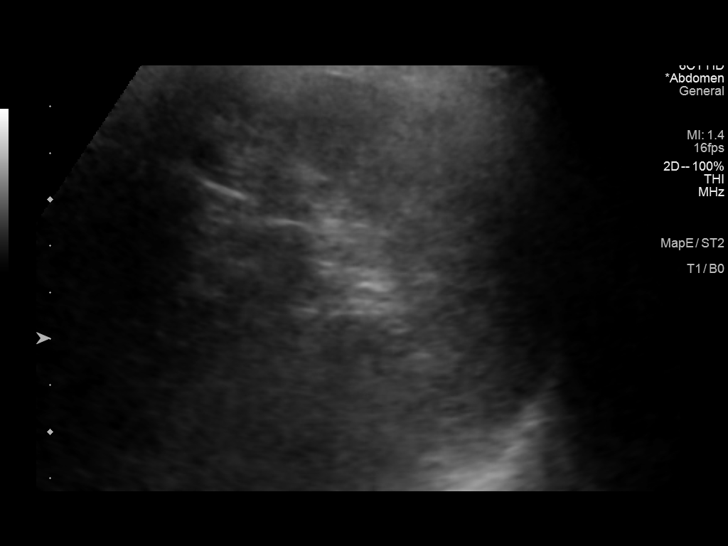
[im 63/95]
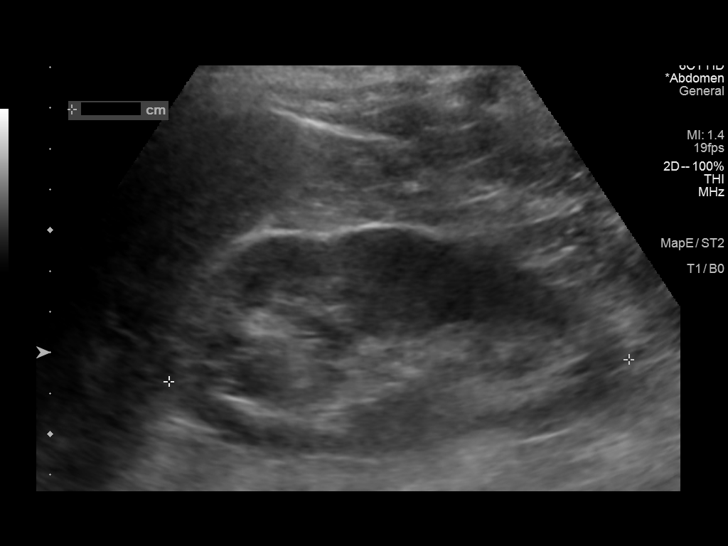
[im 71/95]
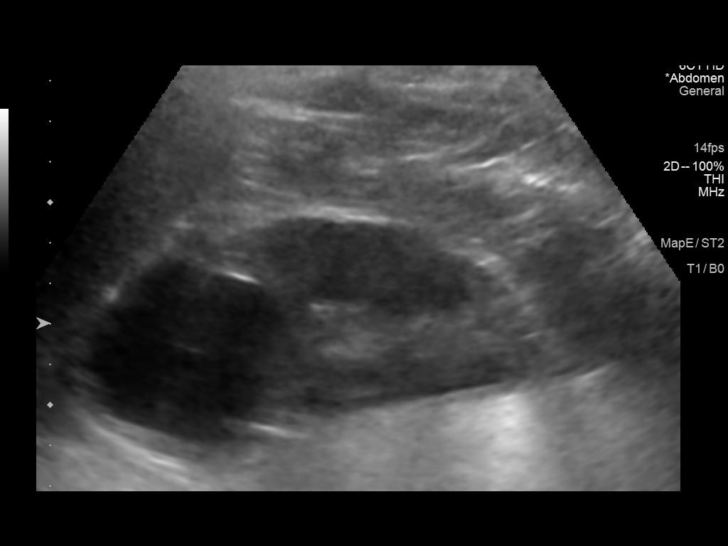
[im 79/95]
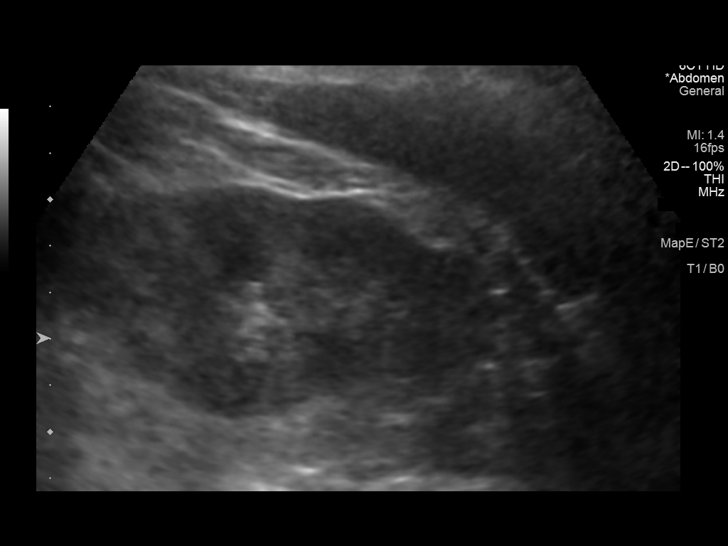
[im 87/95]
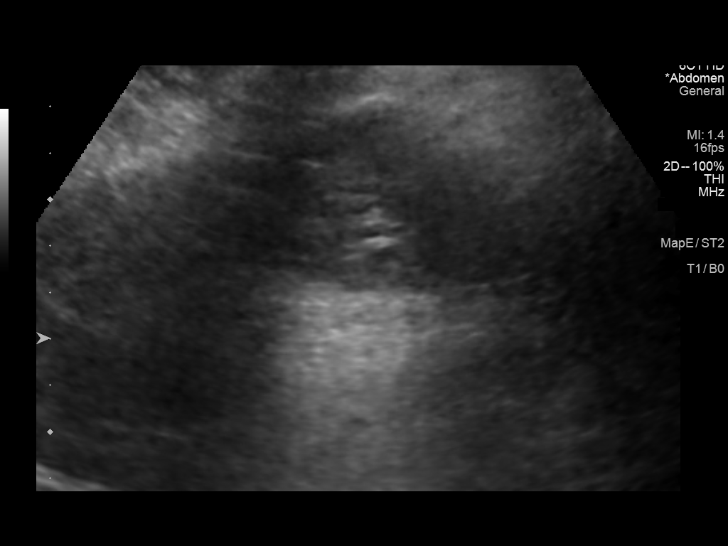
[im 95/95]
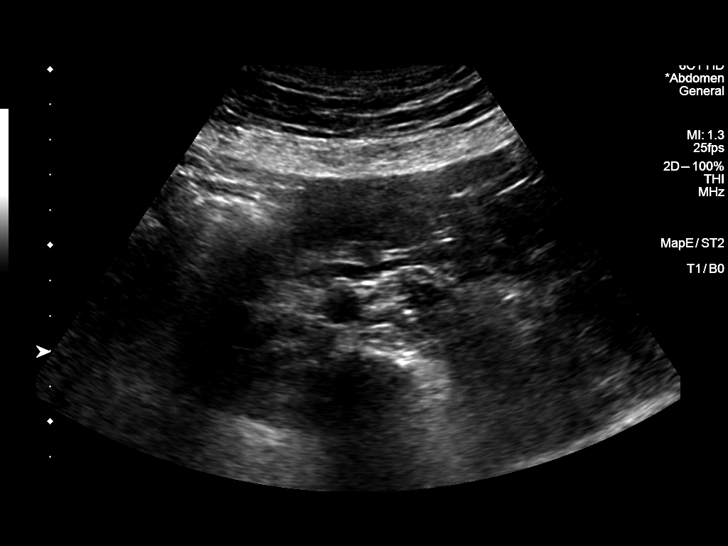

[14 of 25 positions shown; findings below may reference images not displayed]

FINDINGS: Gallbladder: 9 mm non mobile gallstone seen within the gallbladder
neck. Small gallbladder wall polyp anteriorly, 4 mm. No wall
thickening.

Common bile duct: Diameter: Normal caliber, 4 mm

Liver: Increased echotexture compatible with fatty infiltration. No
focal abnormality or biliary ductal dilatation. Portal vein is
patent on color Doppler imaging with normal direction of blood flow
towards the liver.

IVC: No abnormality visualized.

Pancreas: Visualized portion unremarkable.

Spleen: Size and appearance within normal limits.

Right Kidney: Length: 11.3 cm. 4.2 cm upper pole cyst. No
hydronephrosis. Normal echotexture.

Left Kidney: Length: 11.3 cm. Echogenicity within normal limits. No
mass or hydronephrosis visualized.

Abdominal aorta: No aneurysm visualized.

Other findings: None.
IMPRESSION: Cholelithiasis. 9 mm non mobile gallstone in the region of the
gallbladder neck. No wall thickening currently.

Small gallbladder wall polyp.

Diffuse fatty infiltration of the liver.

## 2021-12-24 ENCOUNTER — Other Ambulatory Visit (HOSPITAL_COMMUNITY): Payer: Self-pay

## 2021-12-24 ENCOUNTER — Ambulatory Visit (HOSPITAL_COMMUNITY)
Admission: EM | Admit: 2021-12-24 | Discharge: 2021-12-24 | Disposition: A | Discharge status: Home / Self Care | Payer: Self-pay | Attending: Internal Medicine | Admitting: Internal Medicine

## 2021-12-24 ENCOUNTER — Encounter (HOSPITAL_COMMUNITY): Payer: Self-pay | Admitting: Emergency Medicine

## 2021-12-24 ENCOUNTER — Other Ambulatory Visit: Payer: Self-pay

## 2021-12-24 DIAGNOSIS — K2900 Acute gastritis without bleeding: Secondary | ICD-10-CM

## 2021-12-24 MED ORDER — LISINOPRIL 20 MG PO TABS
20.0000 mg | ORAL_TABLET | Freq: Every day | ORAL | 2 refills | Status: DC
  Filled 2021-12-24: qty 30, 30d supply, fill #0

## 2021-12-24 MED ORDER — ALUM & MAG HYDROXIDE-SIMETH 200-200-20 MG/5ML PO SUSP
30.0000 mL | Freq: Once | ORAL | Status: AC
  Administered 2021-12-24: 30 mL via ORAL

## 2021-12-24 MED ORDER — PANTOPRAZOLE SODIUM 20 MG PO TBEC: 20.0000 mg | DELAYED_RELEASE_TABLET | Freq: Every day | ORAL | 2 refills | Status: DC

## 2021-12-24 MED ORDER — LIDOCAINE VISCOUS HCL 2 % MT SOLN
OROMUCOSAL | Status: AC
  Filled 2021-12-24: qty 15

## 2021-12-24 MED ORDER — LIDOCAINE VISCOUS HCL 2 % MT SOLN
15.0000 mL | Freq: Once | OROMUCOSAL | Status: AC
  Administered 2021-12-24: 15 mL via ORAL

## 2021-12-24 MED ORDER — ALUM & MAG HYDROXIDE-SIMETH 200-200-20 MG/5ML PO SUSP
ORAL | Status: AC
  Filled 2021-12-24: qty 30

## 2021-12-24 MED ORDER — PANTOPRAZOLE SODIUM 20 MG PO TBEC
20.0000 mg | DELAYED_RELEASE_TABLET | Freq: Every day | ORAL | 2 refills | Status: DC
  Filled 2021-12-24: qty 30, 30d supply, fill #0

## 2021-12-24 MED ORDER — LISINOPRIL 20 MG PO TABS: 20.0000 mg | ORAL_TABLET | Freq: Every day | ORAL | 2 refills | Status: DC

## 2021-12-24 NOTE — ED Triage Notes (Signed)
Pt reports intermittent chest pain x 22 days that has gotten progressively worse the past couple days. States "when I wake up, I I feels off and gets dizzy with a headache and bitter taste in mouth. Reports the chest pain is not continuous but when it does happen its a sharp pain, that makes me feel like I'm going to fall"

## 2021-12-24 NOTE — Discharge Instructions (Addendum)
Please take medications as prescribed If abdominal pain worsens or you have persistent vomiting please return to urgent care to be reevaluated.

## 2021-12-24 NOTE — ED Provider Notes (Signed)
[place]    CSN: [id number] Arrival date & time: 12/24/21  5449      History   Chief Complaint Chief Complaint  Patient presents with   Chest Pain    HPI Carl Briggs is a 51 y.o. male to the urgent care with epigastric abdominal pain over the past 3 weeks.  Patient says the pain is burning in nature and feels like acid in his chest.  Pain is worse at night and associated with nausea.  Patient complains of a bitter taste in his mouth when he has episodes of nausea.  No weight changes.  No alcohol use.  No NSAID use.  Pain does not radiate to the shoulder or the back.  He has not tried any over-the-counter medications.  Food occasionally makes it better.  No changes in bowel movement.  No dizziness or diaphoresis.  Patient has a history of hypertension and is requesting medication refill.  He takes lisinopril.  He ran out of his medications about 4 days ago.  HPI  Past Medical History:  Diagnosis Date   Hypertension     Patient Active Problem List   Diagnosis Date Noted   Chest pain 08/11/2021   SIRS (systemic inflammatory response syndrome) ([place]) 08/11/2021   Alcohol withdrawal ([place]) 20/09/711   Metabolic acidosis with increased anion gap and accumulation of organic acids 08/11/2021   Hyponatremia 08/11/2021   Hyperbilirubinemia 08/11/2021   Elevated AST (SGOT) 08/11/2021   Dyspnea 08/11/2021   Low back pain 10/03/2020   Rectal bleeding    GI bleed 11/07/2018   Acute posthemorrhagic anemia 11/07/2018   GI bleeding 11/07/2018    Past Surgical History:  Procedure Laterality Date   COLONOSCOPY WITH PROPOFOL N/A 11/09/2018   Procedure: COLONOSCOPY WITH PROPOFOL;  Surgeon: Arta Silence, MD;  Location: [facility] ENDOSCOPY;  Service: Endoscopy;  Laterality: N/A;       Home Medications    Prior to Admission medications   Medication Sig Start Date End Date Taking? Authorizing Provider  pantoprazole (PROTONIX) 20 MG tablet Take 1 tablet (20 mg total)  by mouth daily. 12/24/21  Yes Betsi Crespi, Myrene Galas, MD  amLODipine (NORVASC) 10 MG tablet Take 1 tablet (10 mg total) by mouth daily. 08/13/21 08/13/22  Shelly Coss, MD  Blood Pressure KIT 1 each by Does not apply route 3 (three) times daily. 08/13/21   Shelly Coss, MD  folic acid (FOLVITE) 1 MG tablet Take 1 tablet (1 mg total) by mouth daily. 08/13/21   Shelly Coss, MD  lisinopril (ZESTRIL) 20 MG tablet Take 1 tablet (20 mg total) by mouth at bedtime. 12/24/21   Dayven Linsley, Myrene Galas, MD  thiamine 100 MG tablet Take 1 tablet (100 mg total) by mouth daily. 08/13/21   Shelly Coss, MD    Family History Family History  Problem Relation Age of Onset   Diabetes Mother     Social History Social History   Tobacco Use   Smoking status: Never   Smokeless tobacco: Never  Vaping Use   Vaping Use: Never used  Substance Use Topics   Alcohol use: No     Allergies   Patient has no known allergies.   Review of Systems Review of Systems  Constitutional: Negative.   Respiratory: Negative.    Gastrointestinal:  Positive for abdominal pain and nausea. Negative for diarrhea and vomiting.  Neurological: Negative.     Physical Exam Triage Vital Signs ED Triage Vitals  Enc Vitals Group     BP 12/24/21  3300 (!) 127/105     Pulse Rate 12/24/21 0934 73     Resp --      Temp 12/24/21 0934 97.7 F (36.5 C)     Temp Source 12/24/21 0934 Oral     SpO2 12/24/21 0934 100 %     Weight 12/24/21 0942 159 lb 6.3 oz (72.3 kg)     Height 12/24/21 0942 5' 1"  (1.549 m)     Head Circumference --      Peak Flow --      Pain Score 12/24/21 0941 6     Pain Loc --      Pain Edu? --      Excl. in [place]? --    No data found.  Updated Vital Signs BP (!) 127/105 (BP Location: Left Arm)    Pulse 73    Temp 97.7 F (36.5 C) (Oral)    Ht 5' 1"  (1.549 m)    Wt 72.3 kg    SpO2 100%    BMI 30.12 kg/m   Visual Acuity Right Eye Distance:   Left Eye Distance:   Bilateral Distance:    Right Eye Near:    Left Eye Near:    Bilateral Near:     Physical Exam Vitals and nursing note reviewed.  Constitutional:      General: He is not in acute distress.    Appearance: He is not ill-appearing.  Cardiovascular:     Rate and Rhythm: Normal rate and regular rhythm.     Heart sounds: Normal heart sounds.  Pulmonary:     Effort: Pulmonary effort is normal.     Breath sounds: Normal breath sounds.  Abdominal:     Comments: Epigastric tenderness.  No guarding or rebound tenderness.  Neurological:     Mental Status: He is alert.     UC Treatments / Results  Labs (all labs ordered are listed, but only abnormal results are displayed) Labs Reviewed - No data to display  EKG   Radiology No results found.  Procedures Procedures (including critical care time)  Medications Ordered in UC Medications  alum & mag hydroxide-simeth (MAALOX/MYLANTA) 200-200-20 MG/5ML suspension 30 mL (30 mLs Oral Given 12/24/21 1011)    And  lidocaine (XYLOCAINE) 2 % viscous mouth solution 15 mL (15 mLs Oral Given 12/24/21 1010)    Initial Impression / Assessment and Plan / UC Course  I have reviewed the triage vital signs and the nursing notes.  Pertinent labs & imaging results that were available during my care of the patient were reviewed by me and considered in my medical decision making (see chart for details).     1.  Acute gastritis without hemorrhage: GI cocktail with improvement in symptoms Protonix 20 mg daily If symptoms persist after 1 week of gastric acid suppression, patient may benefit from gastroenterology evaluation. Return precautions given.  2.  History of hypertension: Medication refill requested Lisinopril has been sent to the pharmacy. Final Clinical Impressions(s) / UC Diagnoses   Final diagnoses:  Acute superficial gastritis without hemorrhage     Discharge Instructions      Please take medications as prescribed If abdominal pain worsens or you have persistent vomiting  please return to urgent care to be reevaluated.      ED Prescriptions     Medication Sig Dispense Auth. Provider   pantoprazole (PROTONIX) 20 MG tablet Take 1 tablet (20 mg total) by mouth daily. 30 tablet Toa Mia, Myrene Galas, MD   lisinopril (ZESTRIL) 20  MG tablet Take 1 tablet (20 mg total) by mouth at bedtime. 30 tablet Lazar Tierce, Myrene Galas, MD      PDMP not reviewed this encounter.   Chase Picket, MD 12/24/21 1024

## 2022-10-09 ENCOUNTER — Ambulatory Visit (HOSPITAL_COMMUNITY)
Admission: EM | Admit: 2022-10-09 | Discharge: 2022-10-09 | Disposition: A | Discharge status: Home / Self Care | Payer: Self-pay

## 2022-10-09 ENCOUNTER — Encounter (HOSPITAL_COMMUNITY): Payer: Self-pay | Admitting: Emergency Medicine

## 2022-10-09 DIAGNOSIS — R002 Palpitations: Secondary | ICD-10-CM | POA: Insufficient documentation

## 2022-10-09 DIAGNOSIS — R52 Pain, unspecified: Secondary | ICD-10-CM | POA: Insufficient documentation

## 2022-10-09 HISTORY — DX: Pure hypercholesterolemia, unspecified: E78.00

## 2022-10-09 HISTORY — DX: Anxiety disorder, unspecified: F41.9

## 2022-10-09 LAB — BASIC METABOLIC PANEL
Anion gap: 10 (ref 5–15)
BUN: 14 mg/dL (ref 6–20)
CO2: 27 mmol/L (ref 22–32)
Calcium: 9.4 mg/dL (ref 8.9–10.3)
Chloride: 102 mmol/L (ref 98–111)
Creatinine, Ser: 0.73 mg/dL (ref 0.61–1.24)
GFR, Estimated: >60 mL/min (ref >60)
Glucose, Bld: 93 mg/dL (ref 70–99)
Potassium: 3.9 mmol/L (ref 3.5–5.1)
Sodium: 139 mmol/L (ref 135–145)

## 2022-10-09 LAB — CBC
HCT: 47.3 % (ref 39.0–52.0)
Hemoglobin: 16 g/dL (ref 13.0–17.0)
MCH: 30.1 pg (ref 26.0–34.0)
MCHC: 33.8 g/dL (ref 30.0–36.0)
MCV: 88.9 fL (ref 80.0–100.0)
Platelets: 228 10*3/uL (ref 150–400)
RBC: 5.32 MIL/uL (ref 4.22–5.81)
RDW: 13.2 % (ref 11.5–15.5)
WBC: 6.2 10*3/uL (ref 4.0–10.5)
nRBC: 0 % (ref 0.0–0.2)

## 2022-10-09 LAB — TSH: TSH: 1.101 u[IU]/mL (ref 0.350–4.500)

## 2022-10-09 MED ORDER — IBUPROFEN 800 MG PO TABS
ORAL_TABLET | ORAL | Status: AC
  Filled 2022-10-09: qty 1

## 2022-10-09 MED ORDER — IBUPROFEN 800 MG PO TABS
800.0000 mg | ORAL_TABLET | Freq: Once | ORAL | Status: AC
  Administered 2022-10-09: 800 mg via ORAL

## 2022-10-09 MED ORDER — IBUPROFEN 600 MG PO TABS: 600.0000 mg | ORAL_TABLET | Freq: Four times a day (QID) | ORAL | 0 refills | Status: DC | PRN

## 2022-10-09 NOTE — Discharge Instructions (Addendum)
Your evaluation at urgent care is reassuring and your exam looks great.  We have drawn blood work today to assess for electrolyte imbalance and blood level abnormalities to cause your rapid heart beat.   Increase your water intake to at least 64 ounces of water per day to stay well-hydrated.  This will also help with your rapid heartbeat.  Follow-up with your primary care provider in November as scheduled for follow-up blood work and reevaluation.   If you develop any new or worsening symptoms or do not improve in the next 2 to 3 days, please return.  If your symptoms are severe, please go to the emergency room.  Follow-up with your primary care provider for further evaluation and management of your symptoms as well as ongoing wellness visits.  I hope you feel better!

## 2022-10-09 NOTE — ED Provider Notes (Signed)
[place]    CSN: [id number] Arrival date & time: 10/09/22  1319      History   Chief Complaint Chief Complaint  Patient presents with   Palpitations    HPI [place] is a 51 y.o. male.   Patient presents to urgent care for evaluation of episodic heart palpitations that happened this morning.  Heart palpitation episode lasted approximately 1 minute or less and patient reports associated nausea and dizziness during episode of heart palpitations.  States that this morning he "felt like he was shaking" but denies fever/chills.  These episodes have happened in the past but patient states that this episode of heart racing was the worst out of all of the episodes he has had in the past.  After the episode of heart palpitations, he says that he placed some rubbing alcohol on a towel then smelled it to help him relax.  Reports headache that started after the heart palpitation episode and is currently mild at this time.  Headache is a throbbing sensation that is generalized to the entire head/face.  Patient is unable to rate this on a scale of 0-10 at this time and states that it is "tolerable".  Reports associated generalized body aches that have been present for the last couple of days but he denies cough, shortness of breath, nasal congestion, shortness of breath, chest pain, and weakness.  No abdominal pain, hematuria, or urinary symptoms.  He does not drink very much water because he states it makes him have to urinate more frequently when he is working and he does not have time for that. He has a history of alcohol dependence but states that he has not had alcohol in 1 year (prior to one alcoholic drink last year, he had not had alcohol in 15 years).  He is not a smoker and denies drug use.  He is a prediabetic and heart palpitations happened this morning when he had not had any breakfast to eat but he states that so did not feel similar to when he has had a low blood sugar  in the past.  Has a history of anxiety and states that he gets stressed sometimes but mostly when he is at his job and not when he is at home.  Denies history of heart problems and states that he has had x-rays in the past of his chest and been told that "everything was fine".  He has never had a heart attack or stroke.  Patient has a history of GI bleeding and reports 1 episode of bright red blood to the stool last week but states that he was constipated at that time and believes that it was caused by hemorrhoid.  He has had multiple normal bowel movements since 1 episode of bright red blood per rectum and last normal bowel movement was this morning without constipation, diarrhea, or blood/mucus of the stool.  No attempted use of over-the-counter medications prior to arrival urgent care for symptoms.   Palpitations   Past Medical History:  Diagnosis Date   Anxiety    High cholesterol    Hypertension     Patient Active Problem List   Diagnosis Date Noted   Chest pain 08/11/2021   SIRS (systemic inflammatory response syndrome) ([place]) 08/11/2021   Alcohol withdrawal ([place]) 02/54/2706   Metabolic acidosis with increased anion gap and accumulation of organic acids 08/11/2021   Hyponatremia 08/11/2021   Hyperbilirubinemia 08/11/2021   Elevated AST (SGOT) 08/11/2021   Dyspnea 08/11/2021  Low back pain 10/03/2020   Rectal bleeding    GI bleed 11/07/2018   Acute posthemorrhagic anemia 11/07/2018   GI bleeding 11/07/2018    Past Surgical History:  Procedure Laterality Date   COLONOSCOPY WITH PROPOFOL N/A 11/09/2018   Procedure: COLONOSCOPY WITH PROPOFOL;  Surgeon: Arta Silence, MD;  Location: [place];  Service: Endoscopy;  Laterality: N/A;       Home Medications    Prior to Admission medications   Medication Sig Start Date End Date Taking? Authorizing Provider  ibuprofen (ADVIL) 600 MG tablet Take 1 tablet (600 mg total) by mouth every 6 (six) hours as needed. 10/09/22  Yes  Talbot Grumbling, FNP  amLODipine (NORVASC) 10 MG tablet Take 1 tablet (10 mg total) by mouth daily. 08/13/21 08/13/22  Shelly Coss, MD  Blood Pressure KIT 1 each by Does not apply route 3 (three) times daily. 08/13/21   Shelly Coss, MD  cyclobenzaprine (FLEXERIL) 5 MG tablet Take 5 mg by mouth at bedtime as needed. 10/07/22   [provider]  escitalopram (LEXAPRO) 10 MG tablet Take 10 mg by mouth daily. 10/07/22   [provider]  folic acid (FOLVITE) 1 MG tablet Take 1 tablet (1 mg total) by mouth daily. 08/13/21   Shelly Coss, MD  hydrochlorothiazide (HYDRODIURIL) 12.5 MG tablet Take 12.5 mg by mouth every morning. 09/06/22   [provider]  indomethacin (INDOCIN) 50 MG capsule Take 50 mg by mouth 3 (three) times daily as needed. 09/30/22   [provider]  ketorolac (TORADOL) 10 MG tablet Take 10 mg by mouth every 6 (six) hours as needed. 10/07/22   [provider]  lisinopril (ZESTRIL) 20 MG tablet Take 1 tablet (20 mg total) by mouth at bedtime. 12/24/21   Chase Picket, MD  meclizine (ANTIVERT) 25 MG tablet Take 25 mg by mouth every 8 (eight) hours as needed. 09/30/22   [provider]  pantoprazole (PROTONIX) 20 MG tablet Take 1 tablet (20 mg total) by mouth daily. 12/24/21   Lamptey, Myrene Galas, MD  thiamine 100 MG tablet Take 1 tablet (100 mg total) by mouth daily. 08/13/21   Shelly Coss, MD    Family History Family History  Problem Relation Age of Onset   Diabetes Mother     Social History Social History   Tobacco Use   Smoking status: Never   Smokeless tobacco: Never  Vaping Use   Vaping Use: Never used  Substance Use Topics   Alcohol use: Not Currently     Allergies   Patient has no known allergies.   Review of Systems Review of Systems  Cardiovascular:  Positive for palpitations.  Per HPI   Physical Exam Triage Vital Signs ED Triage Vitals  Enc Vitals Group     BP 10/09/22 1514 (!) 162/97      Pulse Rate 10/09/22 1514 70     Resp 10/09/22 1514 16     Temp 10/09/22 1514 (!) 97.5 F (36.4 C)     Temp src --      SpO2 10/09/22 1514 98 %     Weight --      Height --      Head Circumference --      Peak Flow --      Pain Score 10/09/22 1508 2     Pain Loc --      Pain Edu? --      Excl. in [place]? --    No data found.  Updated Vital Signs BP (!) 162/97 (BP Location: Right Arm)   Pulse 70   Temp (!) 97.5 F (36.4 C)   Resp 16   SpO2 98%   Visual Acuity Right Eye Distance:   Left Eye Distance:   Bilateral Distance:    Right Eye Near:   Left Eye Near:    Bilateral Near:     Physical Exam Vitals and nursing note reviewed.  Constitutional:      Appearance: He is not ill-appearing or toxic-appearing.  HENT:     Head: Normocephalic and atraumatic.     Right Ear: Hearing and external ear normal.     Left Ear: Hearing and external ear normal.     Nose: Nose normal.     Mouth/Throat:     Lips: Pink.     Mouth: Mucous membranes are moist.     Pharynx: No posterior oropharyngeal erythema.  Eyes:     General: Lids are normal. Vision grossly intact. Gaze aligned appropriately.        Right eye: No discharge.        Left eye: No discharge.     Extraocular Movements: Extraocular movements intact.     Conjunctiva/sclera: Conjunctivae normal.     Pupils: Pupils are equal, round, and reactive to light.  Cardiovascular:     Rate and Rhythm: Normal rate and regular rhythm.     Heart sounds: Normal heart sounds, S1 normal and S2 normal.  Pulmonary:     Effort: Pulmonary effort is normal. No respiratory distress.     Breath sounds: Normal breath sounds and air entry.  Abdominal:     General: Abdomen is flat. Bowel sounds are normal.     Palpations: Abdomen is soft.  Musculoskeletal:     Cervical back: Neck supple.  Skin:    General: Skin is warm and dry.     Capillary Refill: Capillary refill takes less than 2 seconds.     Findings: No rash.  Neurological:      General: No focal deficit present.     Mental Status: He is alert and oriented to person, place, and time. Mental status is at baseline.     Cranial Nerves: No dysarthria or facial asymmetry.     Motor: No weakness.  Psychiatric:        Mood and Affect: Mood normal.        Speech: Speech normal.        Behavior: Behavior normal.        Thought Content: Thought content normal.        Judgment: Judgment normal.      UC Treatments / Results  Labs (all labs ordered are listed, but only abnormal results are displayed) Labs Reviewed  CBC  BASIC METABOLIC PANEL  TSH    EKG   Radiology No results found.  Procedures Procedures (including critical care time)  Medications Ordered in UC Medications  ibuprofen (ADVIL) tablet 800 mg (800 mg Oral Given 10/09/22 1623)    Initial Impression / Assessment and Plan / UC Course  I have reviewed the triage vital signs and the nursing notes.  Pertinent labs & imaging results that were available during my care of the patient were reviewed by me and considered in my medical decision making (see chart for details).   1.  Palpitations and general body aches Unclear etiology of patient's heart palpitations.  Labs drawn to assess for electrolyte abnormalities, thyroid problem, and anemia.  Advised patient to increase water  intake to at least 64 ounces of water per day.  EKG shows normal sinus rhythm with a ventricular rate of 70 bpm without red flag ST changes.  Deferred imaging based on stable cardiopulmonary exam and hemodynamically stable vital signs in clinic.  PCP follow-up advised as scheduled in November.  Patient may benefit from cardiology referral by PCP if symptoms fail to improve or persist.   Discussed physical exam and available lab work findings in clinic with patient.  Counseled patient regarding appropriate use of medications and potential side effects for all medications recommended or prescribed today. Discussed red flag signs and  symptoms of worsening condition,when to call the PCP office, return to urgent care, and when to seek higher level of care in the emergency department. Patient verbalizes understanding and agreement with plan. All questions answered. Patient discharged in stable condition.    Final Clinical Impressions(s) / UC Diagnoses   Final diagnoses:  Palpitations  Generalized body aches     Discharge Instructions      Your evaluation at urgent care is reassuring and your exam looks great.  We have drawn blood work today to assess for electrolyte imbalance and blood level abnormalities to cause your rapid heart beat.   Increase your water intake to at least 64 ounces of water per day to stay well-hydrated.  This will also help with your rapid heartbeat.  Follow-up with your primary care provider in November as scheduled for follow-up blood work and reevaluation.   If you develop any new or worsening symptoms or do not improve in the next 2 to 3 days, please return.  If your symptoms are severe, please go to the emergency room.  Follow-up with your primary care provider for further evaluation and management of your symptoms as well as ongoing wellness visits.  I hope you feel better!     ED Prescriptions     Medication Sig Dispense Auth. Provider   ibuprofen (ADVIL) 600 MG tablet Take 1 tablet (600 mg total) by mouth every 6 (six) hours as needed. 30 tablet Talbot Grumbling, FNP      PDMP not reviewed this encounter.   Talbot Grumbling, [place] 10/09/22 1739

## 2022-10-09 NOTE — ED Triage Notes (Signed)
Used Engineer, manufacturing systems.  Pt reports this morning around 5am heart was racing and beating very hard, felt shaking from head down to toes that lasted about a minute. Pt was asleep when it started. Denies this happening before.  Pt was given medication from a clinic "to relax", that when I felt shaking from head to toes. Pt adds that also teeth feel really tight.

## 2022-10-19 IMAGING — CT CT HEAD W/O CM
4 series · 16 of 47 positions shown, 18 images · non-contrast
Comparison: None.

CLINICAL DATA: Head trauma.

EXAM:
CT HEAD WITHOUT CONTRAST
TECHNIQUE: Contiguous axial images were obtained from the base of the skull
through the vertex without intravenous contrast.

[Series 3: head without · axial · non-contrast · 0.43mm/px · z∈[-109,+6]mm · 7 of 31 slices shown, 9 images]
[im 4/31  brain]
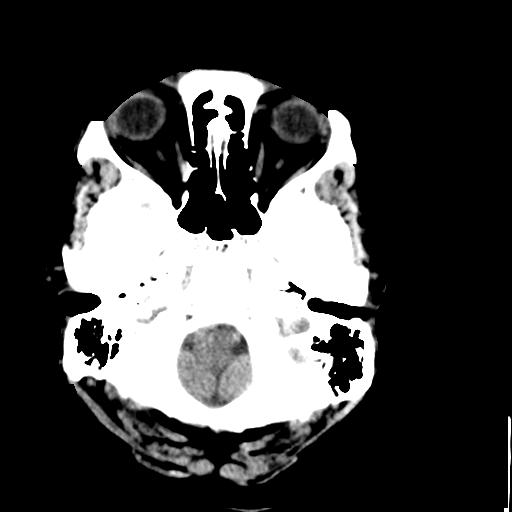
[im 4/31  bone]
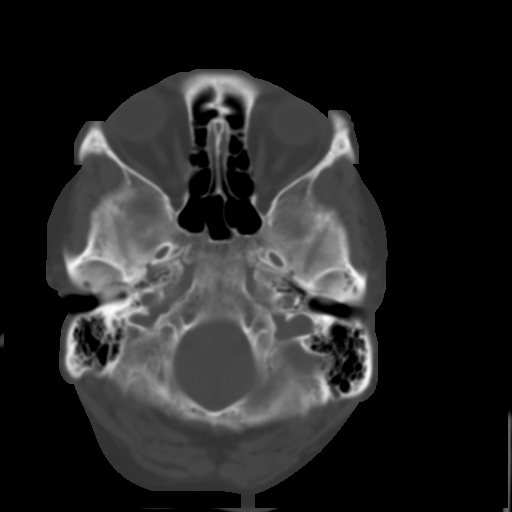
[im 8/31  brain]
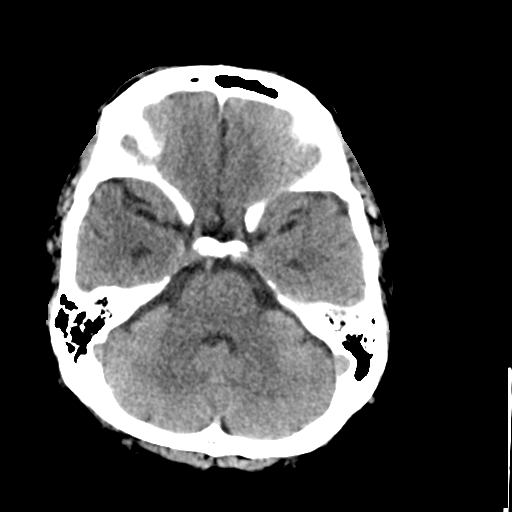
[im 12/31  brain]
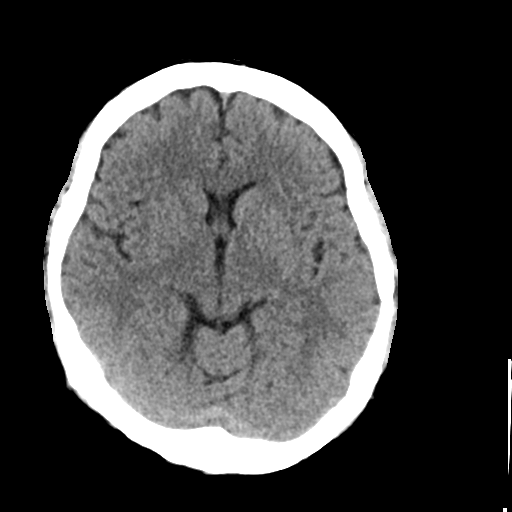
[im 16/31  brain]
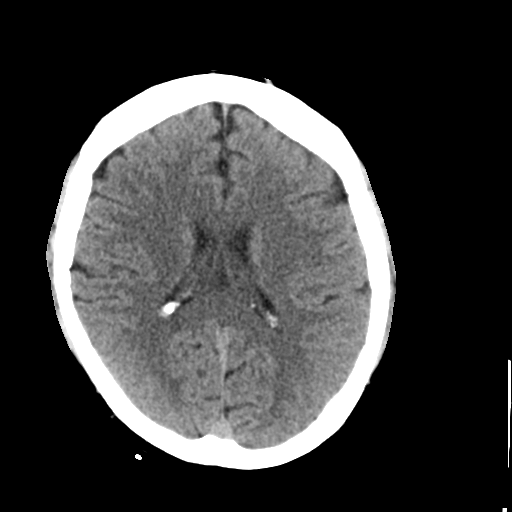
[im 19/31  brain]
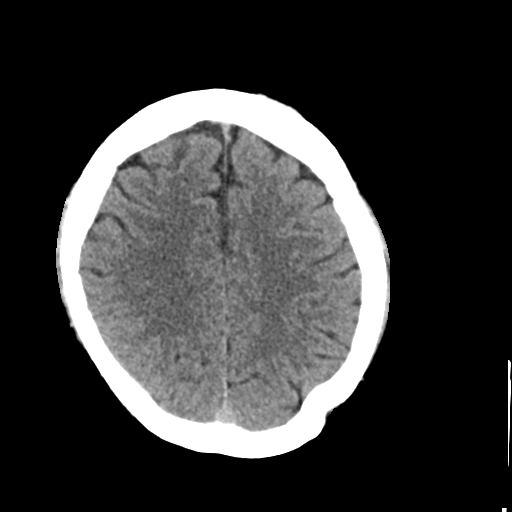
[im 19/31  bone]
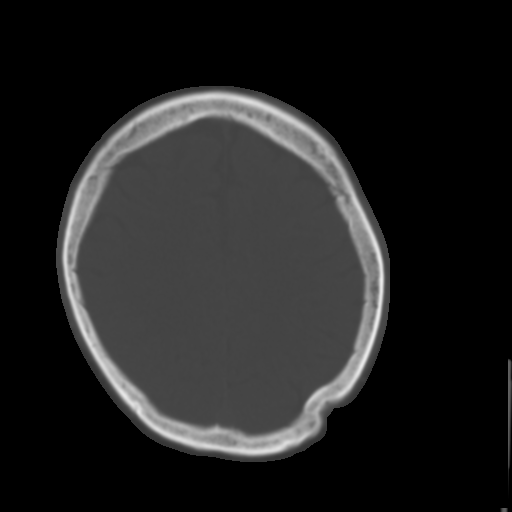
[im 23/31  brain]
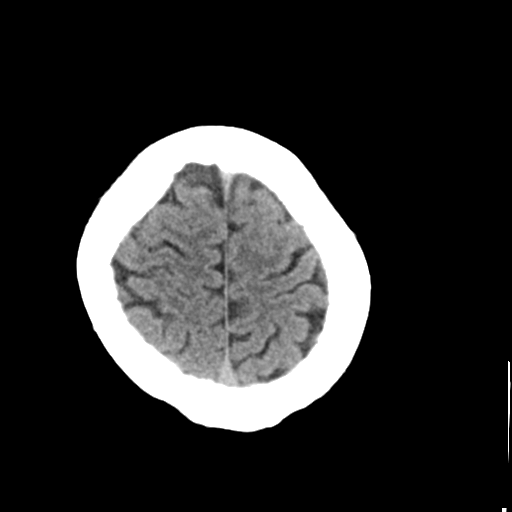
[im 27/31  brain]
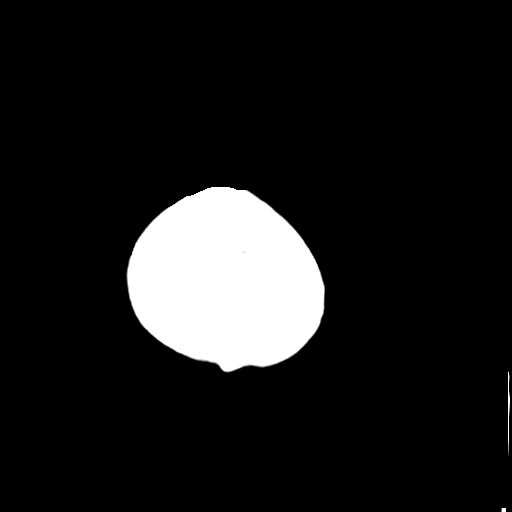

[Series 4: ax head bone · axial · 0.43mm/px · z∈[-103,-71]mm · 3 of 78 slices shown]
[im 8/78  bone]
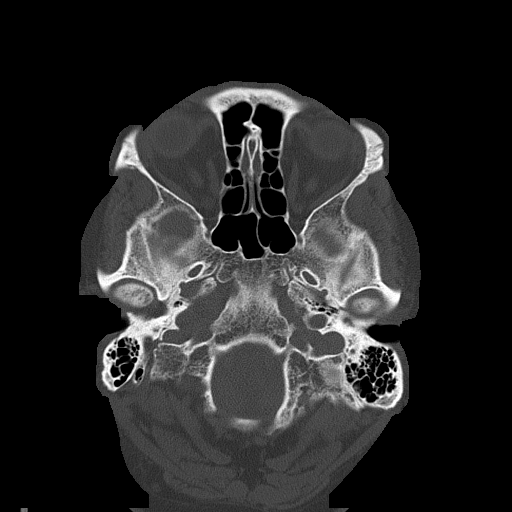
[im 16/78  bone]
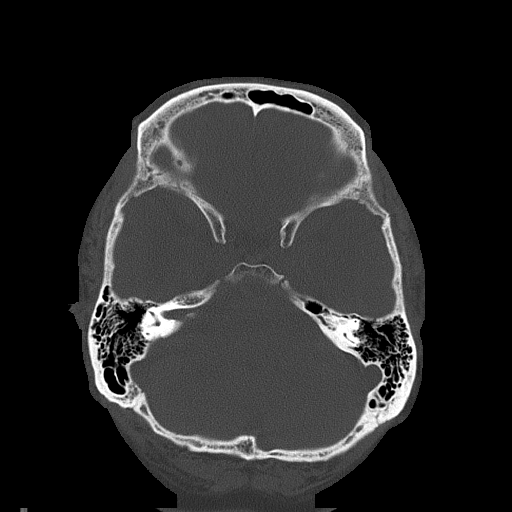
[im 24/78  bone]
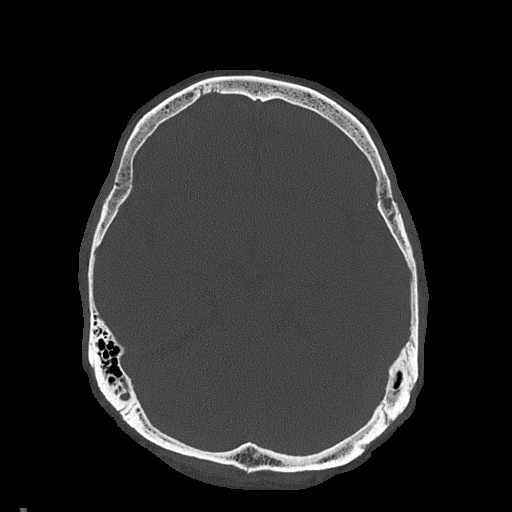

[Series 5: head without cor · coronal · non-contrast · 0.30mm/px · 3 of 63 slices shown]
[im 21/63  brain]
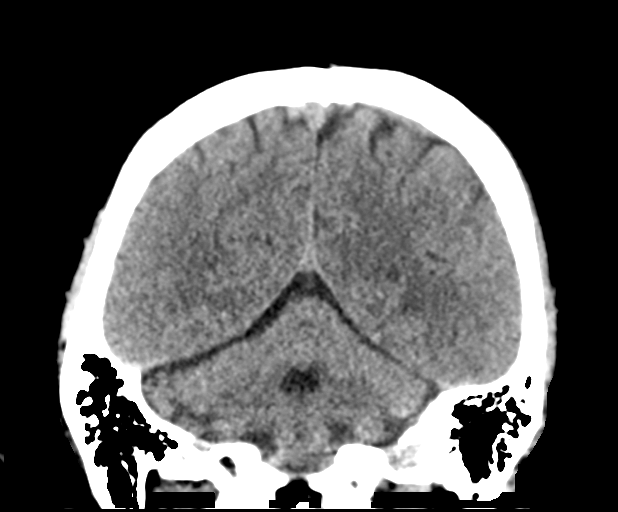
[im 28/63  brain]
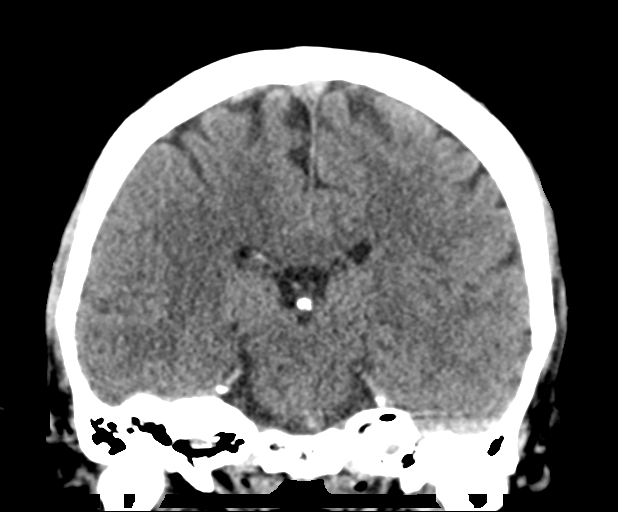
[im 35/63  brain]
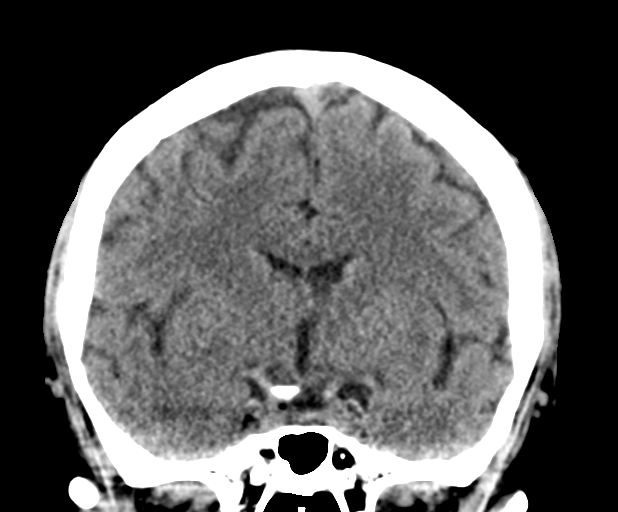

[Series 6: head without sag · sagittal · non-contrast · 0.30mm/px · 3 of 55 slices shown]
[im 19/55  brain]
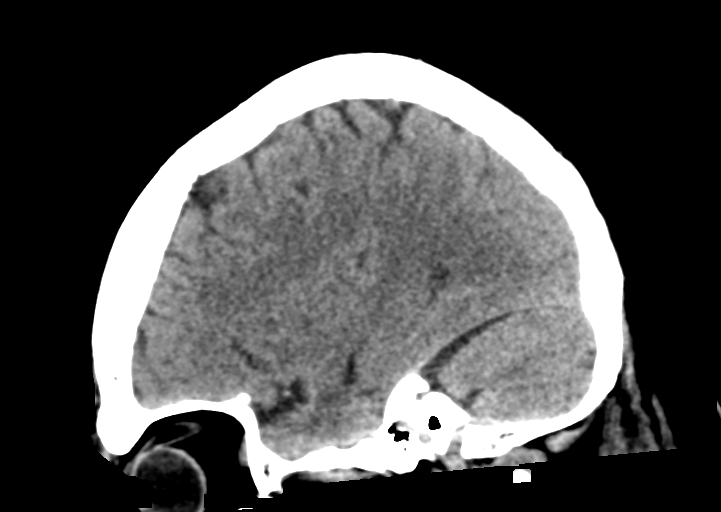
[im 28/55  brain]
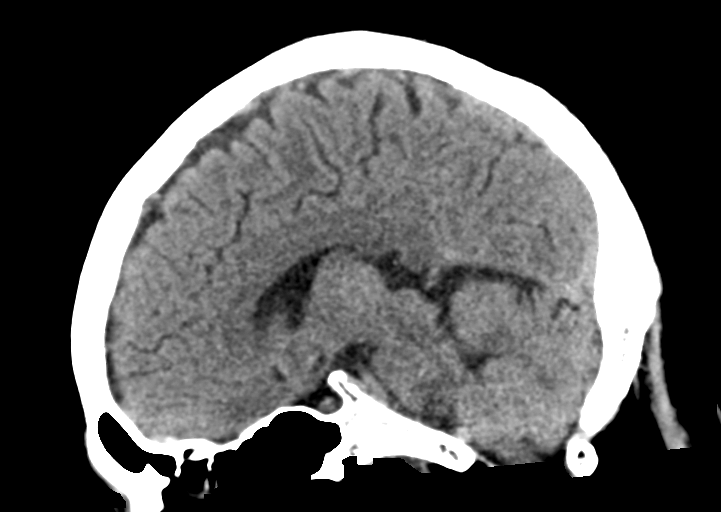
[im 37/55  brain]
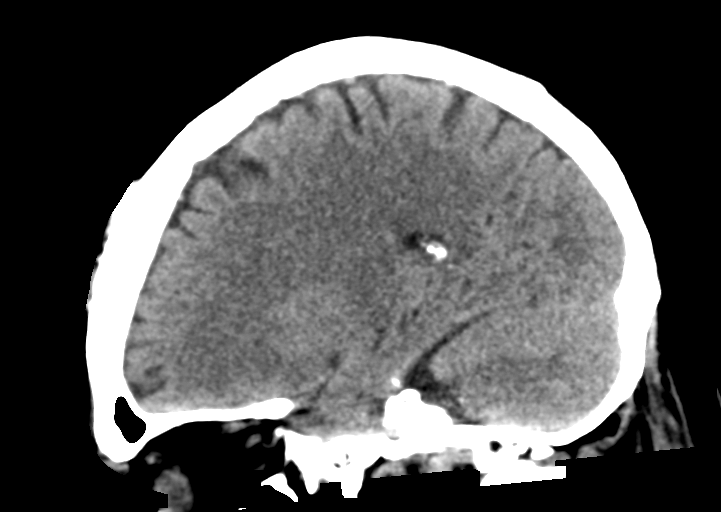

[16 of 47 positions shown; findings below may reference images not displayed]

FINDINGS: Brain: No evidence of acute infarction, hemorrhage, hydrocephalus,
extra-axial collection or mass lesion/mass effect.

Vascular: No hyperdense vessel or unexpected calcification.

Skull: Normal. Negative for fracture or focal lesion.

Sinuses/Orbits: Small RIGHT mastoid effusion. There is mild mucosal
thickening of the paranasal sinuses. No evidence for acute sinus
wall fracture.

Other: LEFT frontal5 scalp edema not associated with underlying
fracture.
IMPRESSION: No acute intracranial abnormality.

LEFT frontal scalp edema/ hematoma not associated with underlying
fracture.

## 2022-10-19 IMAGING — CT CT ANGIO CHEST
4 of 7 series · 16 of 36 positions shown · IV contrast (omnipaque)
Comparison: CT abdomen/pelvis 09/21/2020

CLINICAL DATA: Chest pain, shortness of breath. High probability
suspicion for PE.

EXAM:
CT ANGIOGRAPHY CHEST WITH CONTRAST
TECHNIQUE: Multidetector CT imaging of the chest was performed using the
standard protocol during bolus administration of intravenous
contrast. Multiplanar CT image reconstructions and MIPs were
obtained to evaluate the vascular anatomy.
CONTRAST:  100mL OMNIPAQUE IOHEXOL 350 MG/ML SOLN

[Series 6: pe 2mm · axial · 0.67mm/px · z∈[-805,-625]mm · 4 of 151 slices shown]
[im 31/151  lung]
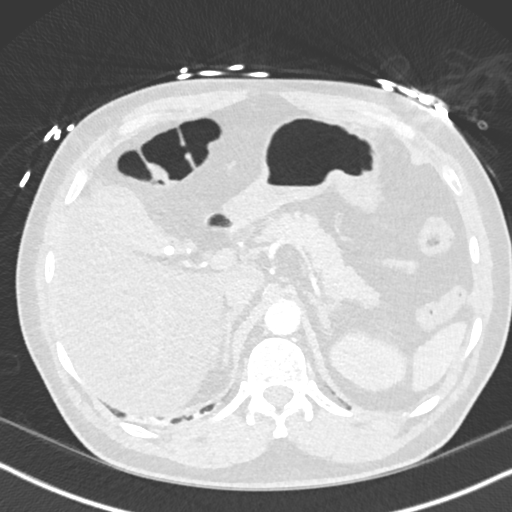
[im 61/151  mediastinal]
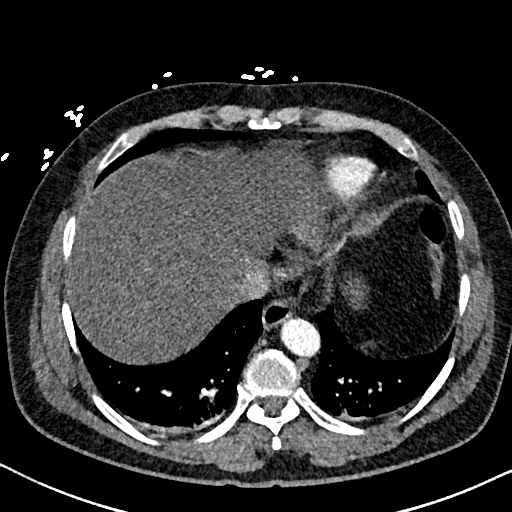
[im 91/151  lung]
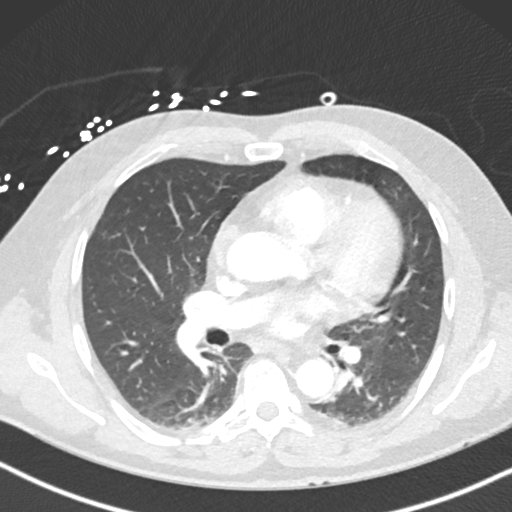
[im 121/151  mediastinal]
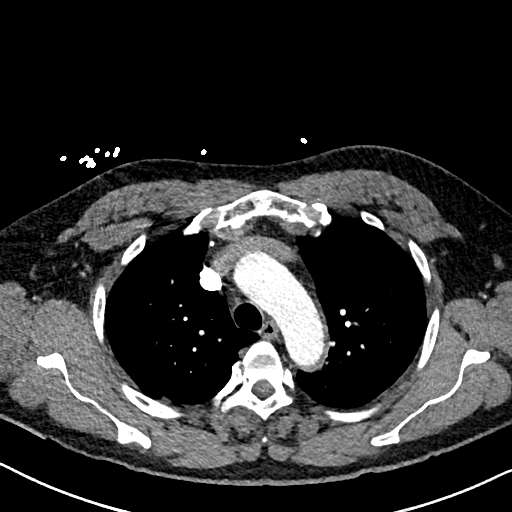

[Series 7: pe lung · axial · 0.59mm/px · z∈[-733,-621]mm · 3 of 113 slices shown]
[im 29/113  mediastinal]
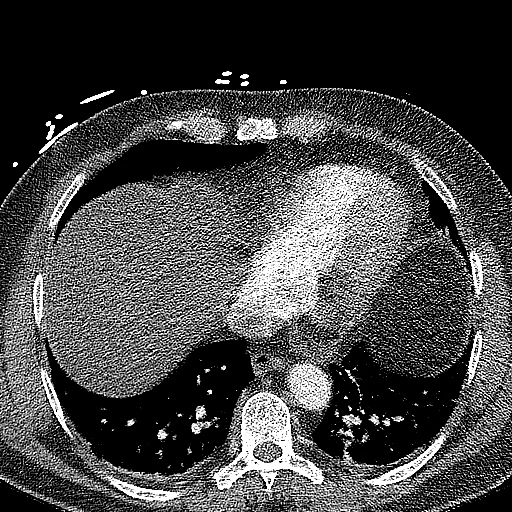
[im 57/113  mediastinal]
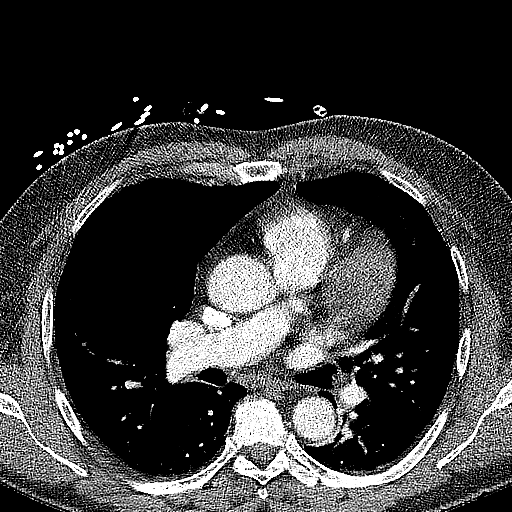
[im 85/113  mediastinal]
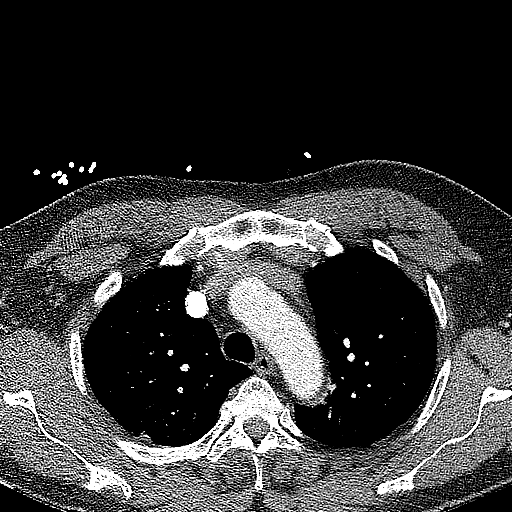

[Series 8: pe thins · axial · 0.59mm/px · z∈[-815,-582]mm · 8 of 381 slices shown]
[im 24/381  lung]
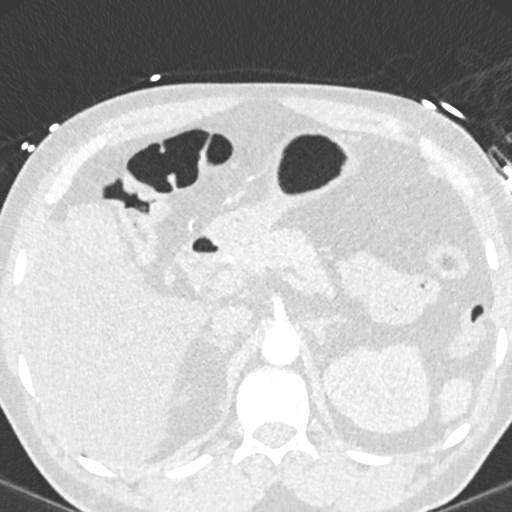
[im 72/381  lung]
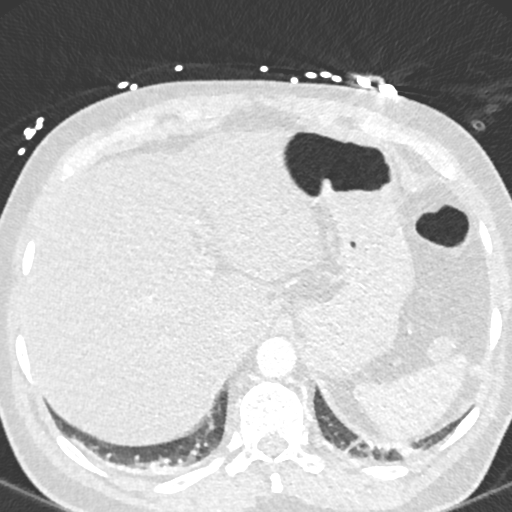
[im 119/381  lung]
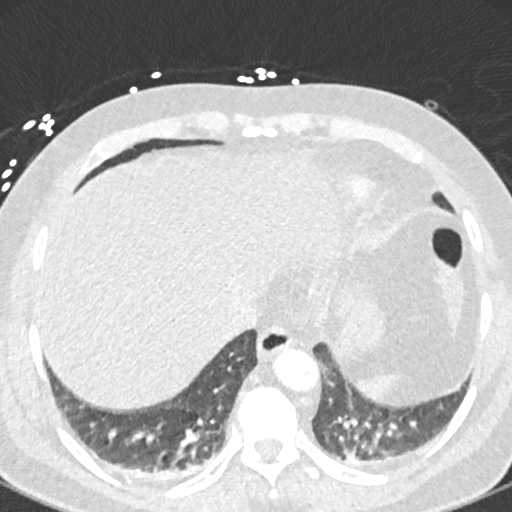
[im 167/381  lung]
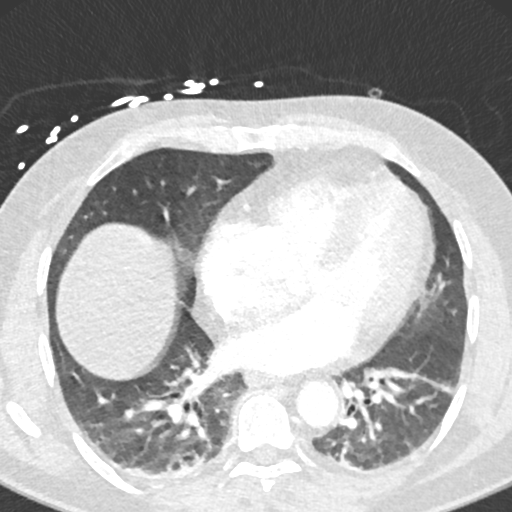
[im 214/381  lung]
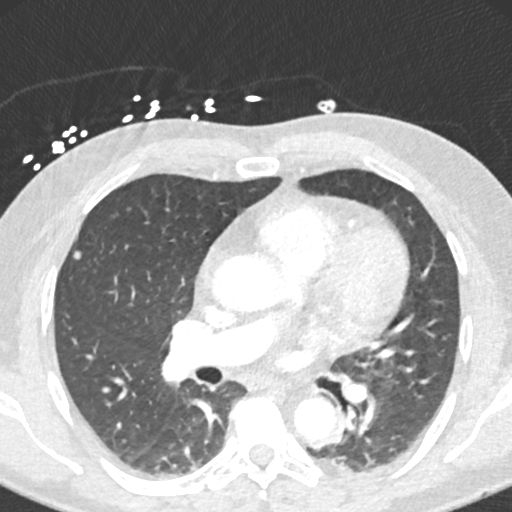
[im 262/381  lung]
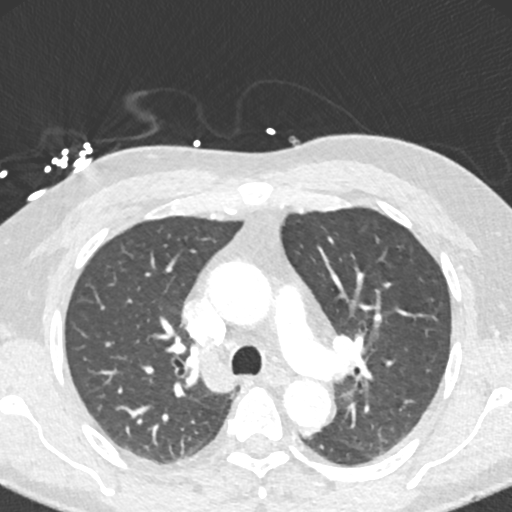
[im 309/381  lung]
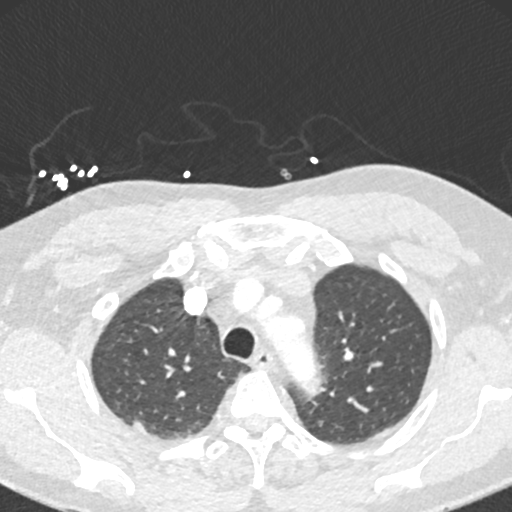
[im 357/381  lung]
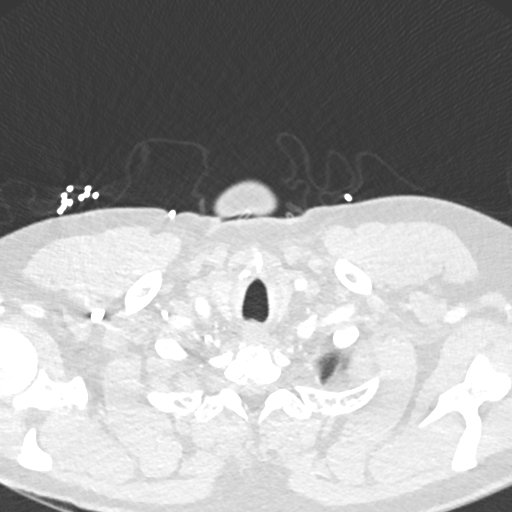

[Series 9: pe 2mm cor · coronal · 0.59mm/px · 1 of 130 slices shown]
[im 65/130  mediastinal]
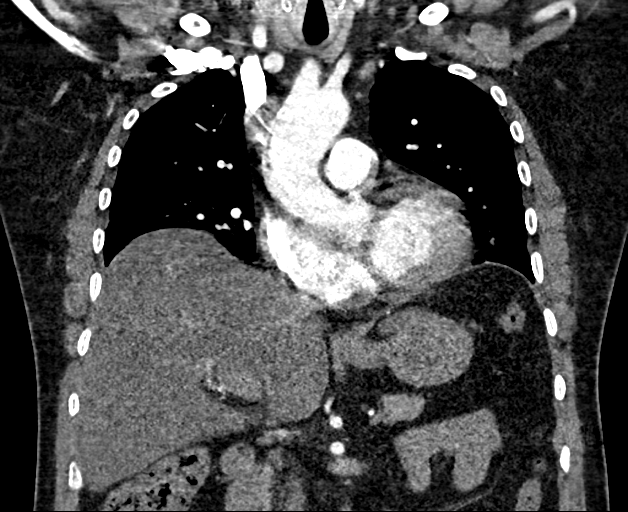

[16 of 36 positions shown; findings below may reference images not displayed]

FINDINGS: Cardiovascular: Satisfactory opacification of the pulmonary arteries
to the segmental level. No evidence of pulmonary embolism.
Cardiomegaly. No pericardial effusion.

Mediastinum/Nodes: Partially peripherally calcified lymph node in
the AP window measures up to 1.2 cm in short axis. No other
lymphadenopathy or mediastinal mass identified. Unremarkable
appearance of the thyroid gland and esophagus.

Lungs/Pleura: Approximately 1.1 x 1.0 cm pulmonary nodule within
internal mineralization/calcification in the periphery of the left
upper lobe (image 49 series 7). In the setting of ipsilateral
calcified and isolated mediastinal lymphadenopathy, this almost
certainly represents a healed Ghon complex. 5 mm pulmonary nodule in
the periphery of the right middle lobe (image 60 series 7). Mild
dependent atelectasis. No focal airspace infiltrate, pulmonary
edema, pleural effusion or pneumothorax.

Upper Abdomen: Diffuse low attenuation of the hepatic parenchyma
consistent with hepatic steatosis. Stones are present within the
gallbladder. No biliary ductal dilatation. Interval collapse of
previously identified cyst arising from the posterior aspect of the
right kidney.

Musculoskeletal: No acute fracture or aggressive appearing lytic or
blastic osseous lesion.

Review of the MIP images confirms the above findings.
IMPRESSION: 1. Negative for acute pulmonary embolus, pneumonia or other acute
cardiopulmonary process.
2. Mild cardiomegaly. Clinical symptoms of chest pain and shortness
of breath may be cardiac in nature.
3. Calcified left upper lobe pulmonary nodule with associated
ipsilateral calcified mediastinal lymph node is consistent with a
Ranke complex and suggests healed prior tuberculosis infection.
4. Small 5 mm right middle lobe pulmonary nodule noted incidentally.
No follow-up needed if patient is low-risk. Non-contrast chest CT
can be considered in 12 months if patient is high-risk. This
recommendation follows the consensus statement: Guidelines for
Management of Incidental Pulmonary Nodules Detected on CT Images:
5. Hepatic steatosis.
6. Cholelithiasis.
7. Interval collapse/resolution of previously identified right
interpolar renal cyst.

## 2023-07-19 ENCOUNTER — Other Ambulatory Visit: Payer: Self-pay

## 2023-07-19 ENCOUNTER — Emergency Department (HOSPITAL_BASED_OUTPATIENT_CLINIC_OR_DEPARTMENT_OTHER): Payer: Self-pay

## 2023-07-19 ENCOUNTER — Encounter (HOSPITAL_BASED_OUTPATIENT_CLINIC_OR_DEPARTMENT_OTHER): Payer: Self-pay

## 2023-07-19 ENCOUNTER — Inpatient Hospital Stay (HOSPITAL_BASED_OUTPATIENT_CLINIC_OR_DEPARTMENT_OTHER)
Admission: EM | Admit: 2023-07-19 | Discharge: 2023-07-23 | DRG: 871 | Disposition: A | Discharge status: Home / Self Care | Payer: Self-pay | Attending: Internal Medicine | Admitting: Internal Medicine

## 2023-07-19 DIAGNOSIS — R7989 Other specified abnormal findings of blood chemistry: Secondary | ICD-10-CM | POA: Diagnosis present

## 2023-07-19 DIAGNOSIS — Z6831 Body mass index (BMI) 31.0-31.9, adult: Secondary | ICD-10-CM

## 2023-07-19 DIAGNOSIS — K529 Noninfective gastroenteritis and colitis, unspecified: Secondary | ICD-10-CM | POA: Insufficient documentation

## 2023-07-19 DIAGNOSIS — A09 Infectious gastroenteritis and colitis, unspecified: Secondary | ICD-10-CM | POA: Diagnosis present

## 2023-07-19 DIAGNOSIS — I2489 Other forms of acute ischemic heart disease: Secondary | ICD-10-CM | POA: Diagnosis present

## 2023-07-19 DIAGNOSIS — I10 Essential (primary) hypertension: Secondary | ICD-10-CM | POA: Diagnosis present

## 2023-07-19 DIAGNOSIS — E876 Hypokalemia: Secondary | ICD-10-CM | POA: Diagnosis present

## 2023-07-19 DIAGNOSIS — E669 Obesity, unspecified: Secondary | ICD-10-CM | POA: Diagnosis present

## 2023-07-19 DIAGNOSIS — F419 Anxiety disorder, unspecified: Secondary | ICD-10-CM | POA: Diagnosis present

## 2023-07-19 DIAGNOSIS — Z8719 Personal history of other diseases of the digestive system: Secondary | ICD-10-CM

## 2023-07-19 DIAGNOSIS — K5900 Constipation, unspecified: Secondary | ICD-10-CM | POA: Diagnosis present

## 2023-07-19 DIAGNOSIS — E7800 Pure hypercholesterolemia, unspecified: Secondary | ICD-10-CM | POA: Diagnosis present

## 2023-07-19 DIAGNOSIS — I214 Non-ST elevation (NSTEMI) myocardial infarction: Secondary | ICD-10-CM | POA: Diagnosis present

## 2023-07-19 DIAGNOSIS — Z1152 Encounter for screening for COVID-19: Secondary | ICD-10-CM

## 2023-07-19 DIAGNOSIS — A419 Sepsis, unspecified organism: Principal | ICD-10-CM | POA: Diagnosis present

## 2023-07-19 DIAGNOSIS — E871 Hypo-osmolality and hyponatremia: Secondary | ICD-10-CM | POA: Diagnosis present

## 2023-07-19 DIAGNOSIS — Z79899 Other long term (current) drug therapy: Secondary | ICD-10-CM

## 2023-07-19 DIAGNOSIS — Z87891 Personal history of nicotine dependence: Secondary | ICD-10-CM

## 2023-07-19 DIAGNOSIS — Z91148 Patient's other noncompliance with medication regimen for other reason: Secondary | ICD-10-CM

## 2023-07-19 DIAGNOSIS — K8020 Calculus of gallbladder without cholecystitis without obstruction: Secondary | ICD-10-CM | POA: Diagnosis present

## 2023-07-19 DIAGNOSIS — R519 Headache, unspecified: Secondary | ICD-10-CM | POA: Diagnosis present

## 2023-07-19 LAB — D-DIMER, QUANTITATIVE: D-Dimer, Quant: 1.14 ug/mL-FEU — ABNORMAL HIGH (ref 0.00–0.50)

## 2023-07-19 LAB — CBC
HCT: 39.9 % (ref 39.0–52.0)
Hemoglobin: 13.9 g/dL (ref 13.0–17.0)
MCH: 30.2 pg (ref 26.0–34.0)
MCHC: 34.8 g/dL (ref 30.0–36.0)
MCV: 86.6 fL (ref 80.0–100.0)
Platelets: 184 10*3/uL (ref 150–400)
RBC: 4.61 MIL/uL (ref 4.22–5.81)
RDW: 14.1 % (ref 11.5–15.5)
WBC: 18.4 10*3/uL — ABNORMAL HIGH (ref 4.0–10.5)
nRBC: 0 % (ref 0.0–0.2)

## 2023-07-19 LAB — BASIC METABOLIC PANEL
Anion gap: 11 (ref 5–15)
BUN: 15 mg/dL (ref 6–20)
CO2: 20 mmol/L — ABNORMAL LOW (ref 22–32)
Calcium: 8.5 mg/dL — ABNORMAL LOW (ref 8.9–10.3)
Chloride: 98 mmol/L (ref 98–111)
Creatinine, Ser: 0.78 mg/dL (ref 0.61–1.24)
GFR, Estimated: >60 mL/min (ref >60)
Glucose, Bld: 133 mg/dL — ABNORMAL HIGH (ref 70–99)
Potassium: 4.1 mmol/L (ref 3.5–5.1)
Sodium: 129 mmol/L — ABNORMAL LOW (ref 135–145)

## 2023-07-19 LAB — TROPONIN I (HIGH SENSITIVITY): Troponin I (High Sensitivity): 924 ng/L (ref <18)

## 2023-07-19 MED ORDER — ONDANSETRON HCL 4 MG/2ML IJ SOLN
4.0000 mg | Freq: Once | INTRAMUSCULAR | Status: AC
  Administered 2023-07-19: 4 mg via INTRAVENOUS
  Filled 2023-07-19: qty 2

## 2023-07-19 MED ORDER — IOHEXOL 350 MG/ML SOLN
100.0000 mL | Freq: Once | INTRAVENOUS | Status: AC | PRN
  Administered 2023-07-19: 100 mL via INTRAVENOUS

## 2023-07-19 MED ORDER — SODIUM CHLORIDE 0.9 % IV BOLUS: 1000.0000 mL | Freq: Once | INTRAVENOUS | Status: DC

## 2023-07-19 MED ORDER — MORPHINE SULFATE (PF) 4 MG/ML IV SOLN
4.0000 mg | Freq: Once | INTRAVENOUS | Status: AC
  Administered 2023-07-19: 4 mg via INTRAVENOUS
  Filled 2023-07-19: qty 1

## 2023-07-19 MED ORDER — LACTATED RINGERS IV BOLUS
1000.0000 mL | Freq: Once | INTRAVENOUS | Status: AC
  Administered 2023-07-19: 1000 mL via INTRAVENOUS

## 2023-07-19 NOTE — ED Triage Notes (Signed)
Pt states has been constipated x 3 days, has headache, feels bloated. Has been taking ibuprofen for headache without relief, alka-seltzer for abdominal discomfort. Also c/o chest pain x 3 days. Denies nausea, vomiting, diarrhea. Endorses SOB.

## 2023-07-20 ENCOUNTER — Emergency Department (HOSPITAL_BASED_OUTPATIENT_CLINIC_OR_DEPARTMENT_OTHER): Payer: Self-pay

## 2023-07-20 ENCOUNTER — Other Ambulatory Visit: Payer: Self-pay

## 2023-07-20 DIAGNOSIS — K529 Noninfective gastroenteritis and colitis, unspecified: Secondary | ICD-10-CM

## 2023-07-20 DIAGNOSIS — I214 Non-ST elevation (NSTEMI) myocardial infarction: Principal | ICD-10-CM | POA: Diagnosis present

## 2023-07-20 DIAGNOSIS — R7989 Other specified abnormal findings of blood chemistry: Secondary | ICD-10-CM | POA: Diagnosis present

## 2023-07-20 DIAGNOSIS — Z91199 Patient's noncompliance with other medical treatment and regimen due to unspecified reason: Secondary | ICD-10-CM

## 2023-07-20 DIAGNOSIS — I10 Essential (primary) hypertension: Secondary | ICD-10-CM

## 2023-07-20 LAB — HEPARIN LEVEL (UNFRACTIONATED)
Heparin Unfractionated: <0.1 IU/mL — ABNORMAL LOW (ref 0.30–0.70)
Heparin Unfractionated: <0.1 IU/mL — ABNORMAL LOW (ref 0.30–0.70)

## 2023-07-20 LAB — SARS CORONAVIRUS 2 BY RT PCR: SARS Coronavirus 2 by RT PCR: NEGATIVE

## 2023-07-20 LAB — CBC
HCT: 37.9 % — ABNORMAL LOW (ref 39.0–52.0)
Hemoglobin: 12.8 g/dL — ABNORMAL LOW (ref 13.0–17.0)
MCH: 30 pg (ref 26.0–34.0)
MCHC: 33.8 g/dL (ref 30.0–36.0)
MCV: 88.8 fL (ref 80.0–100.0)
Platelets: 186 10*3/uL (ref 150–400)
RBC: 4.27 MIL/uL (ref 4.22–5.81)
RDW: 14.3 % (ref 11.5–15.5)
WBC: 18.1 10*3/uL — ABNORMAL HIGH (ref 4.0–10.5)
nRBC: 0 % (ref 0.0–0.2)

## 2023-07-20 LAB — HEPATIC FUNCTION PANEL
ALT: 25 U/L (ref 0–44)
AST: 32 U/L (ref 15–41)
Albumin: 3.8 g/dL (ref 3.5–5.0)
Alkaline Phosphatase: 84 U/L (ref 38–126)
Bilirubin, Direct: 0.7 mg/dL — ABNORMAL HIGH (ref 0.0–0.2)
Indirect Bilirubin: 2.6 mg/dL — ABNORMAL HIGH (ref 0.3–0.9)
Total Bilirubin: 3.3 mg/dL — ABNORMAL HIGH (ref 0.3–1.2)
Total Protein: 7.5 g/dL (ref 6.5–8.1)

## 2023-07-20 LAB — ETHANOL: Alcohol, Ethyl (B): <10 mg/dL (ref <10)

## 2023-07-20 LAB — HIV ANTIBODY (ROUTINE TESTING W REFLEX): HIV Screen 4th Generation wRfx: NONREACTIVE

## 2023-07-20 LAB — LIPASE, BLOOD: Lipase: 13 U/L (ref 11–51)

## 2023-07-20 LAB — MAGNESIUM: Magnesium: 2.2 mg/dL (ref 1.7–2.4)

## 2023-07-20 LAB — LIPID PANEL
Cholesterol: 142 mg/dL (ref 0–200)
HDL: 49 mg/dL (ref >40)
LDL Cholesterol: 78 mg/dL (ref 0–99)
Total CHOL/HDL Ratio: 2.9 RATIO
Triglycerides: 75 mg/dL (ref <150)
VLDL: 15 mg/dL (ref 0–40)

## 2023-07-20 LAB — TROPONIN I (HIGH SENSITIVITY)
Troponin I (High Sensitivity): 725 ng/L (ref <18)
Troponin I (High Sensitivity): 428 ng/L (ref <18)
Troponin I (High Sensitivity): 439 ng/L (ref <18)

## 2023-07-20 LAB — CK: Total CK: 151 U/L (ref 49–397)

## 2023-07-20 MED ORDER — HEPARIN BOLUS VIA INFUSION
2000.0000 [IU] | Freq: Once | INTRAVENOUS | Status: AC
  Administered 2023-07-20: 2000 [IU] via INTRAVENOUS
  Filled 2023-07-20: qty 2000

## 2023-07-20 MED ORDER — HEPARIN BOLUS VIA INFUSION
2000.0000 [IU] | Freq: Once | INTRAVENOUS | Status: AC
  Administered 2023-07-20: 2000 [IU] via INTRAVENOUS

## 2023-07-20 MED ORDER — ACETAMINOPHEN 500 MG PO TABS
1000.0000 mg | ORAL_TABLET | Freq: Once | ORAL | Status: AC
  Administered 2023-07-20: 1000 mg via ORAL
  Filled 2023-07-20: qty 2

## 2023-07-20 MED ORDER — SODIUM CHLORIDE 0.9 % IV SOLN: INTRAVENOUS | Status: DC

## 2023-07-20 MED ORDER — NITROGLYCERIN 0.4 MG SL SUBL: 0.4000 mg | SUBLINGUAL_TABLET | SUBLINGUAL | Status: DC | PRN

## 2023-07-20 MED ORDER — PANTOPRAZOLE SODIUM 40 MG IV SOLR
40.0000 mg | INTRAVENOUS | Status: DC
  Administered 2023-07-20 – 2023-07-23 (×4): 40 mg via INTRAVENOUS
  Filled 2023-07-20 (×4): qty 10

## 2023-07-20 MED ORDER — POLYETHYLENE GLYCOL 3350 17 G PO PACK
17.0000 g | PACK | Freq: Every day | ORAL | Status: DC
  Administered 2023-07-20 – 2023-07-22 (×3): 17 g via ORAL
  Filled 2023-07-20 (×3): qty 1

## 2023-07-20 MED ORDER — ONDANSETRON HCL 4 MG/2ML IJ SOLN: 4.0000 mg | Freq: Four times a day (QID) | INTRAMUSCULAR | Status: DC | PRN

## 2023-07-20 MED ORDER — FOLIC ACID 1 MG PO TABS
1.0000 mg | ORAL_TABLET | Freq: Every day | ORAL | Status: DC
  Administered 2023-07-20: 1 mg via ORAL
  Filled 2023-07-20: qty 1

## 2023-07-20 MED ORDER — ASPIRIN 81 MG PO TBEC
81.0000 mg | DELAYED_RELEASE_TABLET | Freq: Every day | ORAL | Status: DC
  Administered 2023-07-20 – 2023-07-23 (×4): 81 mg via ORAL
  Filled 2023-07-20 (×4): qty 1

## 2023-07-20 MED ORDER — PANTOPRAZOLE SODIUM 20 MG PO TBEC: 20.0000 mg | DELAYED_RELEASE_TABLET | Freq: Every day | ORAL | Status: DC

## 2023-07-20 MED ORDER — THIAMINE MONONITRATE 100 MG PO TABS
100.0000 mg | ORAL_TABLET | Freq: Every day | ORAL | Status: DC
  Administered 2023-07-20: 100 mg via ORAL
  Filled 2023-07-20: qty 1

## 2023-07-20 MED ORDER — ASPIRIN 81 MG PO CHEW
324.0000 mg | CHEWABLE_TABLET | Freq: Once | ORAL | Status: AC
  Administered 2023-07-20: 324 mg via ORAL
  Filled 2023-07-20: qty 4

## 2023-07-20 MED ORDER — THIAMINE HCL 100 MG/ML IJ SOLN: 100.0000 mg | Freq: Every day | INTRAMUSCULAR | Status: DC

## 2023-07-20 MED ORDER — HEPARIN (PORCINE) 25000 UT/250ML-% IV SOLN
2100.0000 [IU]/h | INTRAVENOUS | Status: DC
  Administered 2023-07-20: 1150 [IU]/h via INTRAVENOUS
  Administered 2023-07-20: 900 [IU]/h via INTRAVENOUS
  Administered 2023-07-21: 2000 [IU]/h via INTRAVENOUS
  Administered 2023-07-21: 1650 [IU]/h via INTRAVENOUS
  Administered 2023-07-22 – 2023-07-23 (×2): 2100 [IU]/h via INTRAVENOUS
  Filled 2023-07-20 (×7): qty 250

## 2023-07-20 MED ORDER — ADULT MULTIVITAMIN W/MINERALS CH
1.0000 | ORAL_TABLET | Freq: Every day | ORAL | Status: DC
  Administered 2023-07-20 – 2023-07-22 (×3): 1 via ORAL
  Filled 2023-07-20 (×3): qty 1

## 2023-07-20 MED ORDER — MORPHINE SULFATE (PF) 2 MG/ML IV SOLN
2.0000 mg | INTRAVENOUS | Status: DC | PRN
  Administered 2023-07-20: 2 mg via INTRAVENOUS
  Filled 2023-07-20: qty 1

## 2023-07-20 MED ORDER — PIPERACILLIN-TAZOBACTAM 3.375 G IVPB
3.3750 g | Freq: Three times a day (TID) | INTRAVENOUS | Status: DC
  Administered 2023-07-20 – 2023-07-23 (×10): 3.375 g via INTRAVENOUS
  Filled 2023-07-20 (×12): qty 50

## 2023-07-20 MED ORDER — PIPERACILLIN-TAZOBACTAM 3.375 G IVPB
3.3750 g | Freq: Once | INTRAVENOUS | Status: AC
  Administered 2023-07-20: 3.375 g via INTRAVENOUS
  Filled 2023-07-20: qty 50

## 2023-07-20 MED ORDER — ESCITALOPRAM OXALATE 10 MG PO TABS
10.0000 mg | ORAL_TABLET | Freq: Every day | ORAL | Status: DC
  Administered 2023-07-20 – 2023-07-22 (×3): 10 mg via ORAL
  Filled 2023-07-20 (×3): qty 1

## 2023-07-20 MED ORDER — HEPARIN BOLUS VIA INFUSION
4000.0000 [IU] | Freq: Once | INTRAVENOUS | Status: AC
  Administered 2023-07-20: 4000 [IU] via INTRAVENOUS

## 2023-07-20 MED ORDER — MORPHINE SULFATE (PF) 4 MG/ML IV SOLN
4.0000 mg | Freq: Once | INTRAVENOUS | Status: AC
  Administered 2023-07-20: 4 mg via INTRAVENOUS
  Filled 2023-07-20: qty 1

## 2023-07-20 MED ORDER — HYDROCODONE-ACETAMINOPHEN 5-325 MG PO TABS
1.0000 | ORAL_TABLET | ORAL | Status: DC | PRN
  Administered 2023-07-20 – 2023-07-23 (×4): 1 via ORAL
  Filled 2023-07-20 (×4): qty 1

## 2023-07-20 MED ORDER — METOPROLOL TARTRATE 25 MG PO TABS
25.0000 mg | ORAL_TABLET | Freq: Two times a day (BID) | ORAL | Status: DC
  Administered 2023-07-20 – 2023-07-22 (×5): 25 mg via ORAL
  Filled 2023-07-20 (×5): qty 1

## 2023-07-20 NOTE — ED Notes (Signed)
Patient to CT by wheelchair.

## 2023-07-20 NOTE — Progress Notes (Signed)
ANTICOAGULATION CONSULT NOTE  Pharmacy Consult for Heparin Indication: chest pain/ACS  No Known Allergies  Patient Measurements: Height: 5' 0.63" (154 cm) Weight: 74.8 kg (165 lb) IBW/kg (Calculated) : 51.45 Heparin Dosing Weight: 70 kg  Vital Signs: Temp: 99.2 F (37.3 C) (07/28 0603) Temp Source: Oral (07/28 0603) BP: 101/76 (07/28 0630) Pulse Rate: 104 (07/28 0630)  Labs: Recent Labs    07/19/23 2142 07/20/23 0001 07/20/23 0821  HGB 13.9  --   --   HCT 39.9  --   --   PLT 184  --   --   HEPARINUNFRC  --   --  <0.10*  CREATININE 0.78  --   --   CKTOTAL  --  151  --   TROPONINIHS 924* 725*  --     Estimated Creatinine Clearance: 91.8 mL/min (by C-G formula based on SCr of 0.78 mg/dL).   Medical History: Past Medical History:  Diagnosis Date   Anxiety    High cholesterol    Hypertension     Medications:  No current facility-administered medications on file prior to encounter.   Current Outpatient Medications on File Prior to Encounter  Medication Sig Dispense Refill   amLODipine (NORVASC) 10 MG tablet Take 1 tablet (10 mg total) by mouth daily. 30 tablet 0   Blood Pressure KIT 1 each by Does not apply route 3 (three) times daily. 1 kit 0   cyclobenzaprine (FLEXERIL) 5 MG tablet Take 5 mg by mouth at bedtime as needed.     escitalopram (LEXAPRO) 10 MG tablet Take 10 mg by mouth daily.     folic acid (FOLVITE) 1 MG tablet Take 1 tablet (1 mg total) by mouth daily. 30 tablet 0   hydrochlorothiazide (HYDRODIURIL) 12.5 MG tablet Take 12.5 mg by mouth every morning.     ibuprofen (ADVIL) 600 MG tablet Take 1 tablet (600 mg total) by mouth every 6 (six) hours as needed. 30 tablet 0   indomethacin (INDOCIN) 50 MG capsule Take 50 mg by mouth 3 (three) times daily as needed.     ketorolac (TORADOL) 10 MG tablet Take 10 mg by mouth every 6 (six) hours as needed.     lisinopril (ZESTRIL) 20 MG tablet Take 1 tablet (20 mg total) by mouth at bedtime. 30 tablet 2    meclizine (ANTIVERT) 25 MG tablet Take 25 mg by mouth every 8 (eight) hours as needed.     pantoprazole (PROTONIX) 20 MG tablet Take 1 tablet (20 mg total) by mouth daily. 30 tablet 2   thiamine 100 MG tablet Take 1 tablet (100 mg total) by mouth daily. 30 tablet 0     Assessment: 18 YOM presenting with chest pain with elevated troponin and D-dimer. Awaiting cardiology evaluation. Pharmacy consulted to dose heparin.  Heparin level <0.1 on heparin 900 units/hr. No signs of bleeding reported.  Goal of Therapy:  Heparin level 0.3-0.7 units/ml Monitor platelets by anticoagulation protocol: Yes   Plan:  Bolus heparin 2000 units IV once then increase heparin to 1150 units/hr Check heparin level in 6 hours F/u cardiology recommendations  Eldridge Scot, PharmD Clinical Pharmacist 07/20/2023, 10:31 AM

## 2023-07-20 NOTE — ED Notes (Signed)
MD aware of pt vital signs. Pt denies pain at this time, states it is much improved. Denies SOB, denies palpitations.

## 2023-07-20 NOTE — ED Notes (Signed)
Dr. Judd Lien aware of troponin level of 725

## 2023-07-20 NOTE — ED Notes (Signed)
Carl Briggs with cl called for transport

## 2023-07-20 NOTE — ED Provider Notes (Signed)
[facility] AT [facility] Provider Note   CSN: [id number] Arrival date & time: 07/19/23  2142     History  Chief Complaint  Patient presents with   Constipation   Chest Pain    Carl Briggs is a 52 y.o. male.  Patient is a 52 year old male with past medical history of anemia, prior GI bleed, hypertension.  Patient presenting today for evaluation of chest and abdominal pain.  This has been worsening since Wednesday.  He describes a constant pain to the right side of his chest and upper abdomen that began in the absence of any injury or trauma.  His pain is worse when he breathes, moves, or palpates the area.  He denies any bowel or bladder complaints.  He denies any fevers or chills, but has felt fatigued.  Patient speaks minimal [place], mainly [place].  History taken with the use of the translator tablet present at bedside.  The history is provided by the patient.       Home Medications Prior to Admission medications   Medication Sig Start Date End Date Taking? Authorizing Provider  amLODipine (NORVASC) 10 MG tablet Take 1 tablet (10 mg total) by mouth daily. 08/13/21 08/13/22  Burnadette Pop, MD  Blood Pressure KIT 1 each by Does not apply route 3 (three) times daily. 08/13/21   Burnadette Pop, MD  cyclobenzaprine (FLEXERIL) 5 MG tablet Take 5 mg by mouth at bedtime as needed. 10/07/22   [provider]  escitalopram (LEXAPRO) 10 MG tablet Take 10 mg by mouth daily. 10/07/22   [provider]  folic acid (FOLVITE) 1 MG tablet Take 1 tablet (1 mg total) by mouth daily. 08/13/21   Burnadette Pop, MD  hydrochlorothiazide (HYDRODIURIL) 12.5 MG tablet Take 12.5 mg by mouth every morning. 09/06/22   [provider]  ibuprofen (ADVIL) 600 MG tablet Take 1 tablet (600 mg total) by mouth every 6 (six) hours as needed. 10/09/22   Carlisle Beers, FNP  indomethacin (INDOCIN) 50 MG capsule Take 50 mg by mouth 3 (three)  times daily as needed. 09/30/22   [provider]  ketorolac (TORADOL) 10 MG tablet Take 10 mg by mouth every 6 (six) hours as needed. 10/07/22   [provider]  lisinopril (ZESTRIL) 20 MG tablet Take 1 tablet (20 mg total) by mouth at bedtime. 12/24/21   Merrilee Jansky, MD  meclizine (ANTIVERT) 25 MG tablet Take 25 mg by mouth every 8 (eight) hours as needed. 09/30/22   [provider]  pantoprazole (PROTONIX) 20 MG tablet Take 1 tablet (20 mg total) by mouth daily. 12/24/21   Lamptey, Britta Mccreedy, MD  thiamine 100 MG tablet Take 1 tablet (100 mg total) by mouth daily. 08/13/21   Burnadette Pop, MD      Allergies    Patient has no known allergies.    Review of Systems   Review of Systems  All other systems reviewed and are negative.   Physical Exam Updated Vital Signs BP 118/86   Pulse 100   Temp 98.6 F (37 C) (Oral)   Resp (!) 22   Ht 5' 0.63" (1.54 m)   Wt 74.8 kg   SpO2 97%   BMI 31.56 kg/m  Physical Exam Vitals and nursing note reviewed.  Constitutional:      General: He is not in acute distress.    Appearance: He is well-developed. He is not diaphoretic.  HENT:     Head: Normocephalic and atraumatic.  Cardiovascular:     Rate and Rhythm: Normal rate and regular rhythm.     Heart sounds: No murmur heard.    No friction rub.  Pulmonary:     Effort: Pulmonary effort is normal. No respiratory distress.     Breath sounds: Normal breath sounds. No wheezing or rales.  Abdominal:     General: Bowel sounds are normal. There is abdominal bruit. There is no distension.     Palpations: Abdomen is soft.     Tenderness: There is no abdominal tenderness. There is no guarding or rebound.     Comments: There is tenderness to palpation in the right upper quadrant.  Musculoskeletal:        General: Normal range of motion.     Cervical back: Normal range of motion and neck supple.     Right lower leg: No tenderness. No edema.     Left lower leg: No  tenderness. No edema.  Skin:    General: Skin is warm and dry.  Neurological:     Mental Status: He is alert and oriented to person, place, and time.     Coordination: Coordination normal.     ED Results / Procedures / Treatments   Labs (all labs ordered are listed, but only abnormal results are displayed) Labs Reviewed  BASIC METABOLIC PANEL - Abnormal; Notable for the following components:      Result Value   Sodium 129 (*)    CO2 20 (*)    Glucose, Bld 133 (*)    Calcium 8.5 (*)    All other components within normal limits  CBC - Abnormal; Notable for the following components:   WBC 18.4 (*)    All other components within normal limits  D-DIMER, QUANTITATIVE - Abnormal; Notable for the following components:   D-Dimer, Quant 1.14 (*)    All other components within normal limits  HEPATIC FUNCTION PANEL - Abnormal; Notable for the following components:   Total Bilirubin 3.3 (*)    Bilirubin, Direct 0.7 (*)    Indirect Bilirubin 2.6 (*)    All other components within normal limits  TROPONIN I (HIGH SENSITIVITY) - Abnormal; Notable for the following components:   Troponin I (High Sensitivity) 924 (*)    All other components within normal limits  SARS CORONAVIRUS 2 BY RT PCR  LIPASE, BLOOD  TROPONIN I (HIGH SENSITIVITY)    EKG EKG Interpretation Date/Time:  Saturday July 19 2023 22:12:24 EDT Ventricular Rate:  110 PR Interval:  148 QRS Duration:  80 QT Interval:  322 QTC Calculation: 435 R Axis:   -33  Text Interpretation: Sinus tachycardia Left axis deviation Anterolateral infarct (cited on or before 09-Oct-2022) Abnormal ECG When compared with ECG of 09-Oct-2022 15:21, Vent. rate has increased BY  38 BPM Questionable change in initial forces of Lateral leads Confirmed by Ernie Avena ([id number]) on 07/19/2023 10:43:58 PM  Radiology CT ABDOMEN PELVIS W CONTRAST  Result Date: 07/20/2023 CLINICAL DATA:  PE suspected, high probability, abdominal pain, postop EXAM: CT  ANGIOGRAPHY CHEST CT ABDOMEN AND PELVIS WITH CONTRAST TECHNIQUE: Multidetector CT imaging of the chest was performed using the standard protocol during bolus administration of intravenous contrast. Multiplanar CT image reconstructions and MIPs were obtained to evaluate the vascular anatomy. Multidetector CT imaging of the abdomen and pelvis was performed using the standard protocol during bolus administration of intravenous contrast. RADIATION DOSE REDUCTION: This exam was performed according to the departmental dose-optimization program which includes automated exposure control, adjustment of the  mA and/or kV according to patient size and/or use of iterative reconstruction technique. CONTRAST:  OMNIPAQUE IOHEXOL 350 MG/ML SOLN COMPARISON:  CTA chest 08/11/2021 and CT abdomen and pelvis 09/21/2020 FINDINGS: CTA CHEST FINDINGS Cardiovascular: Negative for acute pulmonary embolism. Normal caliber thoracic aorta. No pericardial effusion. Mediastinum/Nodes: Trachea and esophagus are unremarkable. No thoracic adenopathy Lungs/Pleura: Bilateral lower lobe atelectasis. Calcified granuloma left upper lobe. No pleural effusion or pneumothorax. Musculoskeletal: No acute fracture. Review of the MIP images confirms the above findings. CT ABDOMEN and PELVIS FINDINGS Hepatobiliary: Cholelithiasis. No evidence of cholecystitis. No biliary dilation. Unremarkable liver. Pancreas: Unremarkable. Spleen: Unremarkable. Adrenals/Urinary Tract: Normal adrenal glands. No urinary calculi or hydronephrosis. Unremarkable bladder. Stomach/Bowel: Inflammatory fat stranding centered about the terminal ileum with mild colonic and terminal ileal wall thickening. Reactive hyperemia of the normal caliber appendix. Scattered colonic diverticula. No evidence of obstruction. Stomach is within normal limits. Vascular/Lymphatic: Prominent subcentimeter ileocolic lymph nodes on the right are likely reactive. No acute vascular abnormality.  Reproductive: No acute abnormality. Other: Small amount of free fluid in the right pericolic gutter. No free intraperitoneal air. Musculoskeletal: No acute fracture. Review of the MIP images confirms the above findings. IMPRESSION: 1. Negative for acute pulmonary embolism. 2. Constellation of findings favored to represent infectious or inflammatory terminal ileitis/ascending colitis. No abscess or perforation. Electronically Signed   By: Minerva Fester M.D.   On: 07/20/2023 00:24   CT Angio Chest PE W and/or Wo Contrast  Result Date: 07/20/2023 CLINICAL DATA:  PE suspected, high probability, abdominal pain, postop EXAM: CT ANGIOGRAPHY CHEST CT ABDOMEN AND PELVIS WITH CONTRAST TECHNIQUE: Multidetector CT imaging of the chest was performed using the standard protocol during bolus administration of intravenous contrast. Multiplanar CT image reconstructions and MIPs were obtained to evaluate the vascular anatomy. Multidetector CT imaging of the abdomen and pelvis was performed using the standard protocol during bolus administration of intravenous contrast. RADIATION DOSE REDUCTION: This exam was performed according to the departmental dose-optimization program which includes automated exposure control, adjustment of the mA and/or kV according to patient size and/or use of iterative reconstruction technique. CONTRAST:  OMNIPAQUE IOHEXOL 350 MG/ML SOLN COMPARISON:  CTA chest 08/11/2021 and CT abdomen and pelvis 09/21/2020 FINDINGS: CTA CHEST FINDINGS Cardiovascular: Negative for acute pulmonary embolism. Normal caliber thoracic aorta. No pericardial effusion. Mediastinum/Nodes: Trachea and esophagus are unremarkable. No thoracic adenopathy Lungs/Pleura: Bilateral lower lobe atelectasis. Calcified granuloma left upper lobe. No pleural effusion or pneumothorax. Musculoskeletal: No acute fracture. Review of the MIP images confirms the above findings. CT ABDOMEN and PELVIS FINDINGS Hepatobiliary: Cholelithiasis. No  evidence of cholecystitis. No biliary dilation. Unremarkable liver. Pancreas: Unremarkable. Spleen: Unremarkable. Adrenals/Urinary Tract: Normal adrenal glands. No urinary calculi or hydronephrosis. Unremarkable bladder. Stomach/Bowel: Inflammatory fat stranding centered about the terminal ileum with mild colonic and terminal ileal wall thickening. Reactive hyperemia of the normal caliber appendix. Scattered colonic diverticula. No evidence of obstruction. Stomach is within normal limits. Vascular/Lymphatic: Prominent subcentimeter ileocolic lymph nodes on the right are likely reactive. No acute vascular abnormality. Reproductive: No acute abnormality. Other: Small amount of free fluid in the right pericolic gutter. No free intraperitoneal air. Musculoskeletal: No acute fracture. Review of the MIP images confirms the above findings. IMPRESSION: 1. Negative for acute pulmonary embolism. 2. Constellation of findings favored to represent infectious or inflammatory terminal ileitis/ascending colitis. No abscess or perforation. Electronically Signed   By: Minerva Fester M.D.   On: 07/20/2023 00:24   DG Chest Port 1 View  Result Date: 07/19/2023  CLINICAL DATA:  Chest pain EXAM: PORTABLE CHEST 1 VIEW COMPARISON:  CTA chest 08/11/2021 FINDINGS: Low lung volumes accentuate cardiomediastinal silhouette and pulmonary vascularity. Bibasilar atelectasis. Nodule in the left mid lung was previously seen on CT 08/11/2021 and is compatible with a benign calcified granuloma. No pleural effusion or pneumothorax. No displaced rib fractures. IMPRESSION: Low lung volumes and basilar atelectasis. Electronically Signed   By: Minerva Fester M.D.   On: 07/19/2023 23:05    Procedures Procedures    Medications Ordered in ED Medications  sodium chloride 0.9 % bolus 1,000 mL (0 mLs Intravenous Hold 07/19/23 2323)  lactated ringers bolus 1,000 mL (1,000 mLs Intravenous New Bag/Given 07/19/23 2323)  ondansetron (ZOFRAN) injection 4  mg (4 mg Intravenous Given 07/19/23 2328)  morphine (PF) 4 MG/ML injection 4 mg (4 mg Intravenous Given 07/19/23 2328)  iohexol (OMNIPAQUE) 350 MG/ML injection 100 mL (100 mLs Intravenous Contrast Given 07/19/23 2358)    ED Course/ Medical Decision Making/ A&P  Patient is a 52 year old male presenting with complaints of pain to the right upper quadrant and pain in his chest, both worsening since Wednesday.  Patient arrives here with stable vital signs and is afebrile.  Physical examination reveals tenderness to the right upper quadrant, but no other abnormal findings.  Workup initiated including CBC, BMP, hepatic panel, and lipase.  Patient has a leukocytosis with white count of 18,000, but these laboratory studies are otherwise unremarkable.  Troponin on the other hand has returned at 924 and upon repeat is 725.  D-dimer is mildly positive.  Patient sent for CT scan of the chest, abdomen, and pelvis showing no evidence for pulmonary embolism.  He does have cholelithiasis and what appears to be terminal ileitis and ascending colitis.  Chest x-ray shows no acute process.  Patient has been given Zosyn for the ascending colitis and was also started on heparin for what appears to be a non-STEMI.  He has also received 2 doses of morphine for pain.  Care has been discussed with Dr. Paulino Rily from cardiology who has recommended heparinization and admission to the hospitalist service.  I have spoken with Dr. Joneen Roach who agrees to admit.  CRITICAL CARE Performed by: Geoffery Lyons Total critical care time: 40 minutes Critical care time was exclusive of separately billable procedures and treating other patients. Critical care was necessary to treat or prevent imminent or life-threatening deterioration. Critical care was time spent personally by me on the following activities: development of treatment plan with patient and/or surrogate as well as nursing, discussions with consultants, evaluation of patient's  response to treatment, examination of patient, obtaining history from patient or surrogate, ordering and performing treatments and interventions, ordering and review of laboratory studies, ordering and review of radiographic studies, pulse oximetry and re-evaluation of patient's condition.   Final Clinical Impression(s) / ED Diagnoses Final diagnoses:  None    Rx / DC Orders ED Discharge Orders     None         Geoffery Lyons, MD 07/20/23 0157

## 2023-07-20 NOTE — Progress Notes (Signed)
ANTICOAGULATION CONSULT NOTE  Pharmacy Consult for Heparin Indication: chest pain/ACS  No Known Allergies  Patient Measurements: Height: 5' 0.63" (154 cm) Weight: 74.8 kg (165 lb) IBW/kg (Calculated) : 51.45 Heparin Dosing Weight: 70 kg  Vital Signs: Temp: 99.4 F (37.4 C) (07/28 1650) Temp Source: Oral (07/28 1650) BP: 122/88 (07/28 1650) Pulse Rate: 108 (07/28 1650)  Labs: Recent Labs    07/19/23 2142 07/20/23 0001 07/20/23 0821 07/20/23 1757  HGB 13.9  --   --   --   HCT 39.9  --   --   --   PLT 184  --   --   --   HEPARINUNFRC  --   --  <0.10* <0.10*  CREATININE 0.78  --   --   --   CKTOTAL  --  151  --   --   TROPONINIHS 924* 725*  --   --     Estimated Creatinine Clearance: 91.8 mL/min (by C-G formula based on SCr of 0.78 mg/dL).   Medical History: Past Medical History:  Diagnosis Date   Anxiety    High cholesterol    Hypertension     Medications:  No current facility-administered medications on file prior to encounter.   Current Outpatient Medications on File Prior to Encounter  Medication Sig Dispense Refill   amLODipine (NORVASC) 10 MG tablet Take 1 tablet (10 mg total) by mouth daily. 30 tablet 0   Blood Pressure KIT 1 each by Does not apply route 3 (three) times daily. 1 kit 0   cyclobenzaprine (FLEXERIL) 5 MG tablet Take 5 mg by mouth at bedtime as needed.     escitalopram (LEXAPRO) 10 MG tablet Take 10 mg by mouth daily.     folic acid (FOLVITE) 1 MG tablet Take 1 tablet (1 mg total) by mouth daily. 30 tablet 0   hydrochlorothiazide (HYDRODIURIL) 12.5 MG tablet Take 12.5 mg by mouth every morning.     ibuprofen (ADVIL) 600 MG tablet Take 1 tablet (600 mg total) by mouth every 6 (six) hours as needed. 30 tablet 0   indomethacin (INDOCIN) 50 MG capsule Take 50 mg by mouth 3 (three) times daily as needed.     ketorolac (TORADOL) 10 MG tablet Take 10 mg by mouth every 6 (six) hours as needed.     lisinopril (ZESTRIL) 20 MG tablet Take 1 tablet (20  mg total) by mouth at bedtime. 30 tablet 2   meclizine (ANTIVERT) 25 MG tablet Take 25 mg by mouth every 8 (eight) hours as needed.     pantoprazole (PROTONIX) 20 MG tablet Take 1 tablet (20 mg total) by mouth daily. 30 tablet 2   thiamine 100 MG tablet Take 1 tablet (100 mg total) by mouth daily. 30 tablet 0     Assessment: 83 YOM presenting with chest pain with elevated troponin and D-dimer. Awaiting cardiology evaluation. Pharmacy consulted to dose heparin.  Heparin level <0.1 on heparin 1150 units/hr. Checked with nurse, no issues with infusion. No signs of bleeding reported.   Goal of Therapy:  Heparin level 0.3-0.7 units/ml Monitor platelets by anticoagulation protocol: Yes   Plan:  Bolus heparin 2000 units IV once then increase heparin to 1400 units/hr Check heparin level in 6 hours F/u cardiology recommendations  Thank you for involving pharmacy in the patient's care.   Theotis Burrow, PharmD PGY1 Acute Care Pharmacy Resident  07/20/2023 7:01 PM

## 2023-07-20 NOTE — H&P (Signed)
History and Physical    DOA: 07/19/2023  PCP: Patient, No Pcp Per  Patient coming from: Home  Chief Complaint: Chest and abdominal pain  HPI: Carl Briggs is a 52 y.o. male with history h/o hypertension, hyperlipidemia, anxiety disorder presented to [facility] yesterday with complaints of abdominal pain and bloating.  Patient reports diffuse abdominal pain but mostly hurting along his right paraumbilical and flank area, crampy, 10 out of 10 associated with nausea but no vomiting.  Denies any hematochezia or hematemesis or melena but does report feeling constipated.  His good bowel movement was 3 days prior to presentation.  Yesterday he only passed small bits of stool in the ED he states.  Patient also reports feeling heavy in his chest-describes more as chest pressure than chest pain associated with intermittent palpitations and nausea.  Patient does admit to noncompliance with his medications-did not take his blood pressure medications for 3 days PTA.  He is a past smoker but denies any active tobacco/alcohol or drug use.  He works in [employer] and denies any recent exercise intolerance.  Denies any shortness of breath or dizziness.  Does report frontal headache since yesterday, 9/10 not associated with any dizziness, focal tingling or numbness, weakness or vision changes. ED course: Patient had temp of 101.2 at outside ED, pulse 87-123, respiratory rate 17-30, blood pressure 101/76-146/89.  On arrival here temp 99.4, pulse 108, respiratory rate 18, BP 122/88.  Labs yesterday revealed WBC 18.4, hemoglobin 13.9, platelet 184, sodium 129, potassium 4.1, bicarb 20, BUN 15, creatinine 0.78.  Troponin 924->725, BG 133, magnesium 2.2. EKG with sinus tachycardia and QTc 435 ms.   Review of Systems: As per HPI, otherwise review of systems negative.    Past Medical History:  Diagnosis Date   Anxiety    High cholesterol    Hypertension     Past Surgical History:  Procedure Laterality  Date   COLONOSCOPY WITH PROPOFOL N/A 11/09/2018   Procedure: COLONOSCOPY WITH PROPOFOL;  Surgeon: Willis Modena, MD;  Location: [facility] ENDOSCOPY;  Service: Endoscopy;  Laterality: N/A;    Social history:  reports that he has never smoked. He has never used smokeless tobacco. He reports that he does not currently use alcohol. No history on file for drug use. He did smoke 1.5ppd and drink alcohol 16 yrs back--quit now. Denies drug abuse. Works in [employer].    No Known Allergies  Family History  Problem Relation Age of Onset   Diabetes Mother       Prior to Admission medications   Medication Sig Start Date End Date Taking? Authorizing Provider  amLODipine (NORVASC) 10 MG tablet Take 1 tablet (10 mg total) by mouth daily. 08/13/21 08/13/22  Burnadette Pop, MD  Blood Pressure KIT 1 each by Does not apply route 3 (three) times daily. 08/13/21   Burnadette Pop, MD  cyclobenzaprine (FLEXERIL) 5 MG tablet Take 5 mg by mouth at bedtime as needed. 10/07/22   [provider]  escitalopram (LEXAPRO) 10 MG tablet Take 10 mg by mouth daily. 10/07/22   [provider]  folic acid (FOLVITE) 1 MG tablet Take 1 tablet (1 mg total) by mouth daily. 08/13/21   Burnadette Pop, MD  hydrochlorothiazide (HYDRODIURIL) 12.5 MG tablet Take 12.5 mg by mouth every morning. 09/06/22   [provider]  ibuprofen (ADVIL) 600 MG tablet Take 1 tablet (600 mg total) by mouth every 6 (six) hours as needed. 10/09/22   Carlisle Beers, FNP  indomethacin (INDOCIN) 50 MG  capsule Take 50 mg by mouth 3 (three) times daily as needed. 09/30/22   [provider]  ketorolac (TORADOL) 10 MG tablet Take 10 mg by mouth every 6 (six) hours as needed. 10/07/22   [provider]  lisinopril (ZESTRIL) 20 MG tablet Take 1 tablet (20 mg total) by mouth at bedtime. 12/24/21   Merrilee Jansky, MD  meclizine (ANTIVERT) 25 MG tablet Take 25 mg by mouth every 8 (eight) hours as needed. 09/30/22    [provider]  pantoprazole (PROTONIX) 20 MG tablet Take 1 tablet (20 mg total) by mouth daily. 12/24/21   Lamptey, Britta Mccreedy, MD  thiamine 100 MG tablet Take 1 tablet (100 mg total) by mouth daily. 08/13/21   Burnadette Pop, MD    Physical Exam: Vitals:   07/20/23 1500 07/20/23 1519 07/20/23 1530 07/20/23 1650  BP: (!) 137/95  130/88 122/88  Pulse: (!) 106  (!) 112 (!) 108  Resp: (!) 26  19 18   Temp:  99.9 F (37.7 C)  99.4 F (37.4 C)  TempSrc:  Oral  Oral  SpO2: 95%  94% 95%  Weight:      Height:        Constitutional: NAD, calm, comfortable Eyes: PERRL, lids and conjunctivae normal ENMT: Mucous membranes are moist. Posterior pharynx clear of any exudate or lesions.Normal dentition.  Neck: normal, supple, no masses, no thyromegaly Respiratory: clear to auscultation bilaterally, no wheezing, no crackles. Normal respiratory effort. No accessory muscle use.  Cardiovascular: Regular rate and rhythm, no murmurs / rubs / gallops. No extremity edema. 2+ pedal pulses. No carotid bruits.  Abdomen: no tenderness, no masses palpated. No hepatosplenomegaly. Bowel sounds positive.  Musculoskeletal: no clubbing / cyanosis. No joint deformity upper and lower extremities. Good ROM, no contractures. Normal muscle tone.  Neurologic: CN 2-12 grossly intact. Sensation intact, DTR normal. Strength 5/5 in all 4.  Psychiatric: Normal judgment and insight. Alert and oriented x 3. Normal mood.  SKIN/catheters: no rashes, lesions, ulcers. No induration  Labs on Admission: I have personally reviewed following labs and imaging studies  CBC: Recent Labs  Lab 07/19/23 2142  WBC 18.4*  HGB 13.9  HCT 39.9  MCV 86.6  PLT 184   Basic Metabolic Panel: Recent Labs  Lab 07/19/23 2142 07/20/23 0001  NA 129*  --   K 4.1  --   CL 98  --   CO2 20*  --   GLUCOSE 133*  --   BUN 15  --   CREATININE 0.78  --   CALCIUM 8.5*  --   MG  --  2.2   GFR: Estimated Creatinine Clearance: 91.8 mL/min  (by C-G formula based on SCr of 0.78 mg/dL). Recent Labs  Lab 07/19/23 2142  WBC 18.4*   Liver Function Tests: Recent Labs  Lab 07/19/23 2142  AST 32  ALT 25  ALKPHOS 84  BILITOT 3.3*  PROT 7.5  ALBUMIN 3.8   Recent Labs  Lab 07/19/23 2142  LIPASE 13   No results for input(s): "AMMONIA" in the last 168 hours. Coagulation Profile: No results for input(s): "INR", "PROTIME" in the last 168 hours. Cardiac Enzymes: Recent Labs  Lab 07/20/23 0001  CKTOTAL 151   BNP (last 3 results) No results for input(s): "PROBNP" in the last 8760 hours. HbA1C: No results for input(s): "HGBA1C" in the last 72 hours. CBG: No results for input(s): "GLUCAP" in the last 168 hours. Lipid Profile: Recent Labs    07/19/23 2142  CHOL  142  HDL 49  LDLCALC 78  TRIG 75  CHOLHDL 2.9   Thyroid Function Tests: No results for input(s): "TSH", "T4TOTAL", "FREET4", "T3FREE", "THYROIDAB" in the last 72 hours. Anemia Panel: No results for input(s): "VITAMINB12", "FOLATE", "FERRITIN", "TIBC", "IRON", "RETICCTPCT" in the last 72 hours. Urine analysis:    Component Value Date/Time   COLORURINE YELLOW 08/11/2021 0812   APPEARANCEUR CLEAR 08/11/2021 0812   LABSPEC >1.046 (H) 08/11/2021 0812   PHURINE 6.0 08/11/2021 0812   GLUCOSEU 150 (A) 08/11/2021 0812   HGBUR SMALL (A) 08/11/2021 0812   BILIRUBINUR NEGATIVE 08/11/2021 0812   KETONESUR 20 (A) 08/11/2021 0812   PROTEINUR 100 (A) 08/11/2021 0812   NITRITE NEGATIVE 08/11/2021 0812   LEUKOCYTESUR NEGATIVE 08/11/2021 0812    Radiological Exams on Admission: Personally reviewed  CT Head Wo Contrast  Result Date: 07/20/2023 CLINICAL DATA:  Headache EXAM: CT HEAD WITHOUT CONTRAST TECHNIQUE: Contiguous axial images were obtained from the base of the skull through the vertex without intravenous contrast. RADIATION DOSE REDUCTION: This exam was performed according to the departmental dose-optimization program which includes automated exposure control,  adjustment of the mA and/or kV according to patient size and/or use of iterative reconstruction technique. COMPARISON:  08/11/21 FINDINGS: Brain: There is no mass, hemorrhage or extra-axial collection. The size and configuration of the ventricles and extra-axial CSF spaces are normal. The brain parenchyma is normal, without acute or chronic infarction. Vascular: No abnormal hyperdensity of the major intracranial arteries or dural venous sinuses. No intracranial atherosclerosis. Skull: Unchanged soft tissue lesion at the left anterior scalp. Sinuses/Orbits: No fluid levels or advanced mucosal thickening of the visualized paranasal sinuses. No mastoid or middle ear effusion. The orbits are normal. IMPRESSION: 1. No acute intracranial abnormality. Electronically Signed   By: Deatra Robinson M.D.   On: 07/20/2023 02:14   CT ABDOMEN PELVIS W CONTRAST  Result Date: 07/20/2023 CLINICAL DATA:  PE suspected, high probability, abdominal pain, postop EXAM: CT ANGIOGRAPHY CHEST CT ABDOMEN AND PELVIS WITH CONTRAST TECHNIQUE: Multidetector CT imaging of the chest was performed using the standard protocol during bolus administration of intravenous contrast. Multiplanar CT image reconstructions and MIPs were obtained to evaluate the vascular anatomy. Multidetector CT imaging of the abdomen and pelvis was performed using the standard protocol during bolus administration of intravenous contrast. RADIATION DOSE REDUCTION: This exam was performed according to the departmental dose-optimization program which includes automated exposure control, adjustment of the mA and/or kV according to patient size and/or use of iterative reconstruction technique. CONTRAST:  OMNIPAQUE IOHEXOL 350 MG/ML SOLN COMPARISON:  CTA chest 08/11/2021 and CT abdomen and pelvis 09/21/2020 FINDINGS: CTA CHEST FINDINGS Cardiovascular: Negative for acute pulmonary embolism. Normal caliber thoracic aorta. No pericardial effusion. Mediastinum/Nodes: Trachea and  esophagus are unremarkable. No thoracic adenopathy Lungs/Pleura: Bilateral lower lobe atelectasis. Calcified granuloma left upper lobe. No pleural effusion or pneumothorax. Musculoskeletal: No acute fracture. Review of the MIP images confirms the above findings. CT ABDOMEN and PELVIS FINDINGS Hepatobiliary: Cholelithiasis. No evidence of cholecystitis. No biliary dilation. Unremarkable liver. Pancreas: Unremarkable. Spleen: Unremarkable. Adrenals/Urinary Tract: Normal adrenal glands. No urinary calculi or hydronephrosis. Unremarkable bladder. Stomach/Bowel: Inflammatory fat stranding centered about the terminal ileum with mild colonic and terminal ileal wall thickening. Reactive hyperemia of the normal caliber appendix. Scattered colonic diverticula. No evidence of obstruction. Stomach is within normal limits. Vascular/Lymphatic: Prominent subcentimeter ileocolic lymph nodes on the right are likely reactive. No acute vascular abnormality. Reproductive: No acute abnormality. Other: Small amount of free fluid in the  right pericolic gutter. No free intraperitoneal air. Musculoskeletal: No acute fracture. Review of the MIP images confirms the above findings. IMPRESSION: 1. Negative for acute pulmonary embolism. 2. Constellation of findings favored to represent infectious or inflammatory terminal ileitis/ascending colitis. No abscess or perforation. Electronically Signed   By: Minerva Fester M.D.   On: 07/20/2023 00:24   CT Angio Chest PE W and/or Wo Contrast  Result Date: 07/20/2023 CLINICAL DATA:  PE suspected, high probability, abdominal pain, postop EXAM: CT ANGIOGRAPHY CHEST CT ABDOMEN AND PELVIS WITH CONTRAST TECHNIQUE: Multidetector CT imaging of the chest was performed using the standard protocol during bolus administration of intravenous contrast. Multiplanar CT image reconstructions and MIPs were obtained to evaluate the vascular anatomy. Multidetector CT imaging of the abdomen and pelvis was performed  using the standard protocol during bolus administration of intravenous contrast. RADIATION DOSE REDUCTION: This exam was performed according to the departmental dose-optimization program which includes automated exposure control, adjustment of the mA and/or kV according to patient size and/or use of iterative reconstruction technique. CONTRAST:  OMNIPAQUE IOHEXOL 350 MG/ML SOLN COMPARISON:  CTA chest 08/11/2021 and CT abdomen and pelvis 09/21/2020 FINDINGS: CTA CHEST FINDINGS Cardiovascular: Negative for acute pulmonary embolism. Normal caliber thoracic aorta. No pericardial effusion. Mediastinum/Nodes: Trachea and esophagus are unremarkable. No thoracic adenopathy Lungs/Pleura: Bilateral lower lobe atelectasis. Calcified granuloma left upper lobe. No pleural effusion or pneumothorax. Musculoskeletal: No acute fracture. Review of the MIP images confirms the above findings. CT ABDOMEN and PELVIS FINDINGS Hepatobiliary: Cholelithiasis. No evidence of cholecystitis. No biliary dilation. Unremarkable liver. Pancreas: Unremarkable. Spleen: Unremarkable. Adrenals/Urinary Tract: Normal adrenal glands. No urinary calculi or hydronephrosis. Unremarkable bladder. Stomach/Bowel: Inflammatory fat stranding centered about the terminal ileum with mild colonic and terminal ileal wall thickening. Reactive hyperemia of the normal caliber appendix. Scattered colonic diverticula. No evidence of obstruction. Stomach is within normal limits. Vascular/Lymphatic: Prominent subcentimeter ileocolic lymph nodes on the right are likely reactive. No acute vascular abnormality. Reproductive: No acute abnormality. Other: Small amount of free fluid in the right pericolic gutter. No free intraperitoneal air. Musculoskeletal: No acute fracture. Review of the MIP images confirms the above findings. IMPRESSION: 1. Negative for acute pulmonary embolism. 2. Constellation of findings favored to represent infectious or inflammatory terminal  ileitis/ascending colitis. No abscess or perforation. Electronically Signed   By: Minerva Fester M.D.   On: 07/20/2023 00:24   DG Chest Port 1 View  Result Date: 07/19/2023 CLINICAL DATA:  Chest pain EXAM: PORTABLE CHEST 1 VIEW COMPARISON:  CTA chest 08/11/2021 FINDINGS: Low lung volumes accentuate cardiomediastinal silhouette and pulmonary vascularity. Bibasilar atelectasis. Nodule in the left mid lung was previously seen on CT 08/11/2021 and is compatible with a benign calcified granuloma. No pleural effusion or pneumothorax. No displaced rib fractures. IMPRESSION: Low lung volumes and basilar atelectasis. Electronically Signed   By: Minerva Fester M.D.   On: 07/19/2023 23:05    EKG: Independently reviewed.  Sinus tachycardia     Assessment and Plan:   Principal Problem:   NSTEMI (non-ST elevated myocardial infarction) (HCC)    1.Abdominal pain/colitis: Patient does not have any diarrhea-in fact reports feeling constipated.  CT findings indicative of infectious versus inflammatory terminal ileitis/ascending colitis.  Given WBC and fever on presentation, will continue to treat with IV antibiotics-Zosyn.  Avoid constipation.  Denies any history of chronic diarrhea to indicate IBD.  Never had a colonoscopy.  May benefit from interval colonoscopy  2.  Non-STEMI: Patient does report chest discomfort on presentation yesterday and  has been noncompliant with antihypertensives at home.  Last echo in 2022 showed mild LVH with EF 65 to 70%.  Discussed with cardiology who agreed with continuing IV heparin, baby aspirin and nitro as needed for now.  Echocardiogram ordered to look for wall motion abnormality.  Plan for cath if echo shows any significant wall motion changes.  Cardiology will evaluate patient today and advise further.  N.p.o. after midnight in case plan for cath.  3.  Hypertension: Noncompliant with lisinopril-takes on and off.  On presentation to outside ED patient's blood pressure was  low normal albeit in the setting of infection.  Will hold lisinopril and order metoprolol with holding parameters in view of problem #2.  4.  Hyperlipidemia: Patient denies this history, obtain lipid profile in AM  5.  Anxiety: Patient reports being prescribed Lexapro for work-related stress in the past.  He states he ran out of this medication and has not had a chance to see his PCP for refill.  6.  Remote history of smoking and alcohol use: Noted patient started on CIWA protocol at outside ED but patient adamantly denies smoking or drinking alcohol for several years now.  Will DC CIWA protocol and monitor.  DVT prophylaxis: On heparin drip  COVID screen: Negative  Code Status: Full code as discussed and confirmed by patient   .Health care proxy would be his wife who is listed as emergency contact  Patient/Family Communication: Discussed with patient and all questions answered to satisfaction.  Consults called: Cardiology-[facility] fellow on-call Dr Evette Doffing Admission status :I certify that at the point of admission it is my clinical judgment that the patient will require inpatient hospital care spanning beyond 2 midnights from the point of admission due to high intensity of service and high frequency of surveillance required.Inpatient status is judged to be reasonable and necessary in order to provide the required intensity of service to ensure the patient's safety. The patient's presenting symptoms, physical exam findings, and initial radiographic and laboratory data in the context of their chronic comorbidities is felt to place them at high risk for further clinical deterioration. The following factors support the patient status of inpatient :       Alessandra Bevels MD [facility] Pager in [place]  If 7PM-7AM, please contact night-coverage [email or web address]   07/20/2023, 6:26 PM

## 2023-07-20 NOTE — Progress Notes (Signed)
ANTICOAGULATION CONSULT NOTE - Initial Consult  Pharmacy Consult for Heparin Indication: chest pain/ACS  No Known Allergies  Patient Measurements: Height: 5' 0.63" (154 cm) Weight: 74.8 kg (165 lb) IBW/kg (Calculated) : 51.45 Heparin Dosing Weight: 70 kg  Vital Signs: Temp: 98.6 F (37 C) (07/27 2205) Temp Source: Oral (07/27 2205) BP: 118/86 (07/27 2330) Pulse Rate: 100 (07/27 2330)  Labs: Recent Labs    07/19/23 2142  HGB 13.9  HCT 39.9  PLT 184  CREATININE 0.78  TROPONINIHS 924*    Estimated Creatinine Clearance: 91.8 mL/min (by C-G formula based on SCr of 0.78 mg/dL).   Medical History: Past Medical History:  Diagnosis Date   Anxiety    High cholesterol    Hypertension     Medications:  No current facility-administered medications on file prior to encounter.   Current Outpatient Medications on File Prior to Encounter  Medication Sig Dispense Refill   amLODipine (NORVASC) 10 MG tablet Take 1 tablet (10 mg total) by mouth daily. 30 tablet 0   Blood Pressure KIT 1 each by Does not apply route 3 (three) times daily. 1 kit 0   cyclobenzaprine (FLEXERIL) 5 MG tablet Take 5 mg by mouth at bedtime as needed.     escitalopram (LEXAPRO) 10 MG tablet Take 10 mg by mouth daily.     folic acid (FOLVITE) 1 MG tablet Take 1 tablet (1 mg total) by mouth daily. 30 tablet 0   hydrochlorothiazide (HYDRODIURIL) 12.5 MG tablet Take 12.5 mg by mouth every morning.     ibuprofen (ADVIL) 600 MG tablet Take 1 tablet (600 mg total) by mouth every 6 (six) hours as needed. 30 tablet 0   indomethacin (INDOCIN) 50 MG capsule Take 50 mg by mouth 3 (three) times daily as needed.     ketorolac (TORADOL) 10 MG tablet Take 10 mg by mouth every 6 (six) hours as needed.     lisinopril (ZESTRIL) 20 MG tablet Take 1 tablet (20 mg total) by mouth at bedtime. 30 tablet 2   meclizine (ANTIVERT) 25 MG tablet Take 25 mg by mouth every 8 (eight) hours as needed.     pantoprazole (PROTONIX) 20 MG  tablet Take 1 tablet (20 mg total) by mouth daily. 30 tablet 2   thiamine 100 MG tablet Take 1 tablet (100 mg total) by mouth daily. 30 tablet 0     Assessment: 52 y.o. male with chest pain for heparin Goal of Therapy:  Heparin level 0.3-0.7 units/ml Monitor platelets by anticoagulation protocol: Yes   Plan:  Heparin 4000 units IV bolus, then start heparin 900 units/hr Check heparin level in 6 hours.   Eddie Candle 07/20/2023,12:50 AM

## 2023-07-20 NOTE — Consult Note (Addendum)
Cardiology Consultation:   Patient ID: Carl Briggs MRN: [id number]; DOB: 1971-02-28  Admit date: 07/19/2023 Date of Consult: 07/20/2023  Primary Care Provider: Patient, No Pcp Per Primary Cardiologist: None  Primary Electrophysiologist:  None    Patient Profile:   Carl Briggs is a 52 y.o. male with a hx of hypertension, hyperlipidemia, anxiety disorder who is being seen today for the evaluation of chest pain the request of Dr. Lajuana Ripple.  History of Present Illness:   Carl Briggs is a 52 year old male with a past medical history of hypertension, hyperlipidemia, anxiety disorder who presented to the [facility] emergency department yesterday with complaints of abdominal pain and bloating.    Patient notes diffuse abdominal pain that is mostly concentrated along the right lower quadrant and flank area that is crampy and associated with nausea without vomiting. On admission to the ER he was noted to have a temperature of 101.2, heart rate of 120, white blood count of 18, sodium 129, potassium 4.1, bicarb 20, BUN 15.  His CT abdomen pelvis is indicative of infectious versus inflammatory terminal ileitis or ascending colitis.  Given his WBC elevation and fever on presentation the primary team is treating with Zosyn.    Cardiology is consulted because patient was also noted to have chest pain which started yesterday and a troponin elevation.  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  His troponin on presentation was 924 --> 725.  EKG shows T wave flattening the inferior and precordial leads. Pt reports he has felt chest pain like this 3-4 months ago and believes that his chest pain is precipitated by stress. He has had a prior ECHO in 2022 which showed LVEF 65-70%, moderate LVH. He has not had a prior ischemic evaluation.  Past Medical History:  Diagnosis Date   Anxiety    High cholesterol    Hypertension      Past Surgical History:  Procedure Laterality Date   COLONOSCOPY WITH PROPOFOL N/A 11/09/2018   Procedure: COLONOSCOPY WITH PROPOFOL;  Surgeon: Willis Modena, MD;  Location: [facility] ENDOSCOPY;  Service: Endoscopy;  Laterality: N/A;     Home Medications:  Prior to Admission medications   Medication Sig Start Date End Date Taking? Authorizing Provider  amLODipine (NORVASC) 10 MG tablet Take 1 tablet (10 mg total) by mouth daily. 08/13/21 08/13/22  Burnadette Pop, MD  Blood Pressure KIT 1 each by Does not apply route 3 (three) times daily. 08/13/21   Burnadette Pop, MD  cyclobenzaprine (FLEXERIL) 5 MG tablet Take 5 mg by mouth at bedtime as needed. 10/07/22   [provider]  escitalopram (LEXAPRO) 10 MG tablet Take 10 mg by mouth daily. 10/07/22   [provider]  folic acid (FOLVITE) 1 MG tablet Take 1 tablet (1 mg total) by mouth daily. 08/13/21   Burnadette Pop, MD  hydrochlorothiazide (HYDRODIURIL) 12.5 MG tablet Take 12.5 mg by mouth every morning. 09/06/22   [provider]  ibuprofen (ADVIL) 600 MG tablet Take 1 tablet (600 mg total) by mouth every 6 (six) hours as needed. 10/09/22   Carlisle Beers, FNP  indomethacin (INDOCIN) 50 MG capsule Take 50 mg by mouth 3 (three) times daily as needed. 09/30/22   [provider]  ketorolac (TORADOL) 10 MG tablet Take 10 mg by mouth every 6 (six) hours as needed. 10/07/22   [provider]  lisinopril (ZESTRIL) 20 MG tablet Take 1 tablet (20 mg total) by mouth  at bedtime. 12/24/21   Merrilee Jansky, MD  meclizine (ANTIVERT) 25 MG tablet Take 25 mg by mouth every 8 (eight) hours as needed. 09/30/22   [provider]  pantoprazole (PROTONIX) 20 MG tablet Take 1 tablet (20 mg total) by mouth daily. 12/24/21   Lamptey, Britta Mccreedy, MD  thiamine 100 MG tablet Take 1 tablet (100 mg total) by mouth daily. 08/13/21   Burnadette Pop, MD    Inpatient Medications: Scheduled Meds:  aspirin EC  81 mg Oral  Daily   escitalopram  10 mg Oral Daily   metoprolol tartrate  25 mg Oral BID   multivitamin with minerals  1 tablet Oral Daily   pantoprazole (PROTONIX) IV  40 mg Intravenous Q24H   polyethylene glycol  17 g Oral Daily   Continuous Infusions:  sodium chloride 75 mL/hr at 07/20/23 1904   heparin 1,400 Units/hr (07/20/23 1903)   piperacillin-tazobactam (ZOSYN)  IV 3.375 g (07/20/23 1433)   PRN Meds: HYDROcodone-acetaminophen, morphine injection, nitroGLYCERIN, ondansetron (ZOFRAN) IV  Allergies:   No Known Allergies  Social History:   Social History   Socioeconomic History   Marital status: Married    Spouse name: Not on file   Number of children: Not on file   Years of education: Not on file   Highest education level: Not on file  Occupational History   Not on file  Tobacco Use   Smoking status: Never   Smokeless tobacco: Never  Vaping Use   Vaping status: Never Used  Substance and Sexual Activity   Alcohol use: Not Currently   Drug use: Not on file   Sexual activity: Yes  Other Topics Concern   Not on file  Social History Narrative   Not on file   Social Determinants of Health   Financial Resource Strain: Not on file  Food Insecurity: No Food Insecurity (07/20/2023)   Hunger Vital Sign    Worried About Running Out of Food in the Last Year: Never true    Ran Out of Food in the Last Year: Never true  Transportation Needs: No Transportation Needs (07/20/2023)   PRAPARE - Administrator, Civil Service (Medical): No    Lack of Transportation (Non-Medical): No  Physical Activity: Not on file  Stress: Not on file  Social Connections: Not on file  Intimate Partner Violence: Not At Risk (07/20/2023)   Humiliation, Afraid, Rape, and Kick questionnaire    Fear of Current or Ex-Partner: No    Emotionally Abused: No    Physically Abused: No    Sexually Abused: No    Family History:   Family History  Problem Relation Age of Onset   Diabetes Mother       Review of Systems: [y] = yes, [ ]  = no  12 pt ROS negative except what is documented in HPI Physical Exam/Data:   Vitals:   07/20/23 1500 07/20/23 1519 07/20/23 1530 07/20/23 1650  BP: (!) 137/95  130/88 122/88  Pulse: (!) 106  (!) 112 (!) 108  Resp: (!) 26  19 18   Temp:  99.9 F (37.7 C)  99.4 F (37.4 C)  TempSrc:  Oral  Oral  SpO2: 95%  94% 95%  Weight:      Height:       No intake or output data in the 24 hours ending 07/20/23 1933 Filed Weights   07/19/23 2205  Weight: 74.8 kg   Body mass index is 31.56 kg/m.  General:  Well  nourished, well developed, in no acute distress HEENT: normal Lymph: no adenopathy Neck: no JVD Endocrine:  No thryomegaly Vascular: No carotid bruits; FA pulses 2+ bilaterally without bruits  Cardiac:  normal S1, S2; RRR; no murmur  Lungs:  clear to auscultation bilaterally, no wheezing, rhonchi or rales  Abd: pain with palpation to the RLW Ext: no edema Musculoskeletal:  No deformities, BUE and BLE strength normal and equal Skin: warm and dry  Neuro:  CNs 2-12 intact, no focal abnormalities noted Psych:  Normal affect   EKG:  The EKG was personally reviewed and demonstrates:  NSR with T wave flattening in precordial leads  Relevant CV Studies: ECHO 2022  1. Left ventricular ejection fraction, by estimation, is 65 to 70%. The  left ventricle has normal function. The left ventricle has no regional  wall motion abnormalities. There is moderate left ventricular hypertrophy.  Left ventricular diastolic  parameters were normal.   2. Right ventricular systolic function is normal. The right ventricular  size is normal. Tricuspid regurgitation signal is inadequate for assessing  PA pressure.   3. The mitral valve is normal in structure. No evidence of mitral valve  regurgitation. No evidence of mitral stenosis.   4. The aortic valve is tricuspid. Aortic valve regurgitation is not  visualized. No aortic stenosis is present.   Laboratory  Data:  Chemistry Recent Labs  Lab 07/19/23 2142  NA 129*  K 4.1  CL 98  CO2 20*  GLUCOSE 133*  BUN 15  CREATININE 0.78  CALCIUM 8.5*  GFRNONAA >60  ANIONGAP 11    Recent Labs  Lab 07/19/23 2142  PROT 7.5  ALBUMIN 3.8  AST 32  ALT 25  ALKPHOS 84  BILITOT 3.3*   Hematology Recent Labs  Lab 07/19/23 2142 07/20/23 1757  WBC 18.4* 18.1*  RBC 4.61 4.27  HGB 13.9 12.8*  HCT 39.9 37.9*  MCV 86.6 88.8  MCH 30.2 30.0  MCHC 34.8 33.8  RDW 14.1 14.3  PLT 184 186   Cardiac EnzymesNo results for input(s): "TROPONINI" in the last 168 hours. No results for input(s): "TROPIPOC" in the last 168 hours.  BNPNo results for input(s): "BNP", "PROBNP" in the last 168 hours.  DDimer  Recent Labs  Lab 07/19/23 2142  DDIMER 1.14*    Radiology/Studies:  CT Head Wo Contrast  Result Date: 07/20/2023 CLINICAL DATA:  Headache EXAM: CT HEAD WITHOUT CONTRAST TECHNIQUE: Contiguous axial images were obtained from the base of the skull through the vertex without intravenous contrast. RADIATION DOSE REDUCTION: This exam was performed according to the departmental dose-optimization program which includes automated exposure control, adjustment of the mA and/or kV according to patient size and/or use of iterative reconstruction technique. COMPARISON:  08/11/21 FINDINGS: Brain: There is no mass, hemorrhage or extra-axial collection. The size and configuration of the ventricles and extra-axial CSF spaces are normal. The brain parenchyma is normal, without acute or chronic infarction. Vascular: No abnormal hyperdensity of the major intracranial arteries or dural venous sinuses. No intracranial atherosclerosis. Skull: Unchanged soft tissue lesion at the left anterior scalp. Sinuses/Orbits: No fluid levels or advanced mucosal thickening of the visualized paranasal sinuses. No mastoid or middle ear effusion. The orbits are normal. IMPRESSION: 1. No acute intracranial abnormality. Electronically Signed   By:  Deatra Robinson M.D.   On: 07/20/2023 02:14   CT ABDOMEN PELVIS W CONTRAST  Result Date: 07/20/2023 CLINICAL DATA:  PE suspected, high probability, abdominal pain, postop EXAM: CT ANGIOGRAPHY CHEST CT ABDOMEN AND PELVIS  WITH CONTRAST TECHNIQUE: Multidetector CT imaging of the chest was performed using the standard protocol during bolus administration of intravenous contrast. Multiplanar CT image reconstructions and MIPs were obtained to evaluate the vascular anatomy. Multidetector CT imaging of the abdomen and pelvis was performed using the standard protocol during bolus administration of intravenous contrast. RADIATION DOSE REDUCTION: This exam was performed according to the departmental dose-optimization program which includes automated exposure control, adjustment of the mA and/or kV according to patient size and/or use of iterative reconstruction technique. CONTRAST:  OMNIPAQUE IOHEXOL 350 MG/ML SOLN COMPARISON:  CTA chest 08/11/2021 and CT abdomen and pelvis 09/21/2020 FINDINGS: CTA CHEST FINDINGS Cardiovascular: Negative for acute pulmonary embolism. Normal caliber thoracic aorta. No pericardial effusion. Mediastinum/Nodes: Trachea and esophagus are unremarkable. No thoracic adenopathy Lungs/Pleura: Bilateral lower lobe atelectasis. Calcified granuloma left upper lobe. No pleural effusion or pneumothorax. Musculoskeletal: No acute fracture. Review of the MIP images confirms the above findings. CT ABDOMEN and PELVIS FINDINGS Hepatobiliary: Cholelithiasis. No evidence of cholecystitis. No biliary dilation. Unremarkable liver. Pancreas: Unremarkable. Spleen: Unremarkable. Adrenals/Urinary Tract: Normal adrenal glands. No urinary calculi or hydronephrosis. Unremarkable bladder. Stomach/Bowel: Inflammatory fat stranding centered about the terminal ileum with mild colonic and terminal ileal wall thickening. Reactive hyperemia of the normal caliber appendix. Scattered colonic diverticula. No evidence of  obstruction. Stomach is within normal limits. Vascular/Lymphatic: Prominent subcentimeter ileocolic lymph nodes on the right are likely reactive. No acute vascular abnormality. Reproductive: No acute abnormality. Other: Small amount of free fluid in the right pericolic gutter. No free intraperitoneal air. Musculoskeletal: No acute fracture. Review of the MIP images confirms the above findings. IMPRESSION: 1. Negative for acute pulmonary embolism. 2. Constellation of findings favored to represent infectious or inflammatory terminal ileitis/ascending colitis. No abscess or perforation. Electronically Signed   By: Minerva Fester M.D.   On: 07/20/2023 00:24   CT Angio Chest PE W and/or Wo Contrast  Result Date: 07/20/2023 CLINICAL DATA:  PE suspected, high probability, abdominal pain, postop EXAM: CT ANGIOGRAPHY CHEST CT ABDOMEN AND PELVIS WITH CONTRAST TECHNIQUE: Multidetector CT imaging of the chest was performed using the standard protocol during bolus administration of intravenous contrast. Multiplanar CT image reconstructions and MIPs were obtained to evaluate the vascular anatomy. Multidetector CT imaging of the abdomen and pelvis was performed using the standard protocol during bolus administration of intravenous contrast. RADIATION DOSE REDUCTION: This exam was performed according to the departmental dose-optimization program which includes automated exposure control, adjustment of the mA and/or kV according to patient size and/or use of iterative reconstruction technique. CONTRAST:  OMNIPAQUE IOHEXOL 350 MG/ML SOLN COMPARISON:  CTA chest 08/11/2021 and CT abdomen and pelvis 09/21/2020 FINDINGS: CTA CHEST FINDINGS Cardiovascular: Negative for acute pulmonary embolism. Normal caliber thoracic aorta. No pericardial effusion. Mediastinum/Nodes: Trachea and esophagus are unremarkable. No thoracic adenopathy Lungs/Pleura: Bilateral lower lobe atelectasis. Calcified granuloma left upper lobe. No pleural  effusion or pneumothorax. Musculoskeletal: No acute fracture. Review of the MIP images confirms the above findings. CT ABDOMEN and PELVIS FINDINGS Hepatobiliary: Cholelithiasis. No evidence of cholecystitis. No biliary dilation. Unremarkable liver. Pancreas: Unremarkable. Spleen: Unremarkable. Adrenals/Urinary Tract: Normal adrenal glands. No urinary calculi or hydronephrosis. Unremarkable bladder. Stomach/Bowel: Inflammatory fat stranding centered about the terminal ileum with mild colonic and terminal ileal wall thickening. Reactive hyperemia of the normal caliber appendix. Scattered colonic diverticula. No evidence of obstruction. Stomach is within normal limits. Vascular/Lymphatic: Prominent subcentimeter ileocolic lymph nodes on the right are likely reactive. No acute vascular abnormality. Reproductive: No acute abnormality. Other: Small  amount of free fluid in the right pericolic gutter. No free intraperitoneal air. Musculoskeletal: No acute fracture. Review of the MIP images confirms the above findings. IMPRESSION: 1. Negative for acute pulmonary embolism. 2. Constellation of findings favored to represent infectious or inflammatory terminal ileitis/ascending colitis. No abscess or perforation. Electronically Signed   By: Minerva Fester M.D.   On: 07/20/2023 00:24   DG Chest Port 1 View  Result Date: 07/19/2023 CLINICAL DATA:  Chest pain EXAM: PORTABLE CHEST 1 VIEW COMPARISON:  CTA chest 08/11/2021 FINDINGS: Low lung volumes accentuate cardiomediastinal silhouette and pulmonary vascularity. Bibasilar atelectasis. Nodule in the left mid lung was previously seen on CT 08/11/2021 and is compatible with a benign calcified granuloma. No pleural effusion or pneumothorax. No displaced rib fractures. IMPRESSION: Low lung volumes and basilar atelectasis. Electronically Signed   By: Minerva Fester M.D.   On: 07/19/2023 23:05    Assessment and Plan:  Carl Briggs is a 52 year old male with a past medical  history of hypertension, hyperlipidemia, anxiety disorder who presented with abdominal pain and nausea with CT findings c/w infectious vs inflammatory ileitis/ascending colitis. Cardiology is consulted for elevated troponins and chest pain c/f NSTEMI vs demand ischemia.  #. 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 Troponins were notably elevated to 900s-->700s on admission, though this was in the setting of infectious colitis. It is possible that his troponin elevation is secondary to demand ischemia from infection, but given his typical chest pain history and cardiac risk factors of HTN and HLD, it is reasonable to treat for NSTEMI and consider ischemic evaluation after acute infection has resolved if he continues to experience chest pain. - Agree with IV heparin ACS protocol - s/p aspirin load, continue asa 81 mg - NPO at MN, though likely will not pursue LHC until acute infection has resolved and if he is having persistent symptoms. If patient's symptoms improve after acute infection has resolved, could also consider stopping heparin and performing stress test for ischemic eval instead of LHC. - lipid panel, A1c for cardiac risk stratification - cardiology will continue to follow  #. Infectious ileitis vs ascending colitis CT findings concerning for infectious ileitis/ascending colitis - Agree with management per primary team  For questions or updates, please contact [facility] Please consult [email or web address] for contact info under   Signed, Willette Alma, MD  [facility] 07/20/2023 7:33 PM

## 2023-07-21 ENCOUNTER — Inpatient Hospital Stay (HOSPITAL_COMMUNITY): Payer: Self-pay

## 2023-07-21 DIAGNOSIS — R079 Chest pain, unspecified: Secondary | ICD-10-CM

## 2023-07-21 LAB — HEPARIN LEVEL (UNFRACTIONATED)
Heparin Unfractionated: 0.21 IU/mL — ABNORMAL LOW (ref 0.30–0.70)
Heparin Unfractionated: 0.16 IU/mL — ABNORMAL LOW (ref 0.30–0.70)

## 2023-07-21 MED ORDER — DOCUSATE SODIUM 100 MG PO CAPS
100.0000 mg | ORAL_CAPSULE | Freq: Two times a day (BID) | ORAL | Status: DC
  Administered 2023-07-22: 100 mg via ORAL
  Filled 2023-07-21 (×4): qty 1

## 2023-07-21 MED ORDER — ACETAMINOPHEN 325 MG PO TABS
650.0000 mg | ORAL_TABLET | Freq: Four times a day (QID) | ORAL | Status: DC | PRN
  Administered 2023-07-21 – 2023-07-22 (×5): 650 mg via ORAL
  Filled 2023-07-21 (×6): qty 2

## 2023-07-21 MED ORDER — PERFLUTREN LIPID MICROSPHERE
1.0000 mL | INTRAVENOUS | Status: AC | PRN
  Administered 2023-07-21: 5 mL via INTRAVENOUS

## 2023-07-21 MED ORDER — HEPARIN BOLUS VIA INFUSION
2000.0000 [IU] | Freq: Once | INTRAVENOUS | Status: AC
  Administered 2023-07-21: 2000 [IU] via INTRAVENOUS
  Filled 2023-07-21: qty 2000

## 2023-07-21 MED ORDER — HEPARIN BOLUS VIA INFUSION
1000.0000 [IU] | Freq: Once | INTRAVENOUS | Status: AC
  Administered 2023-07-21: 1000 [IU] via INTRAVENOUS
  Filled 2023-07-21: qty 1000

## 2023-07-21 MED ORDER — BISACODYL 10 MG RE SUPP: 10.0000 mg | Freq: Every day | RECTAL | Status: DC | PRN

## 2023-07-21 NOTE — Progress Notes (Signed)
   07/21/23 0826  Assess: MEWS Score  Temp (!) 102.1 F (38.9 C)  BP 126/76  MAP (mmHg) 89  Pulse Rate (!) 101  ECG Heart Rate 99  Resp 19  Level of Consciousness Alert  SpO2 93 %  O2 Device Room Air  Assess: MEWS Score  MEWS Temp 2  MEWS Systolic 0  MEWS Pulse 0  MEWS RR 0  MEWS LOC 0  MEWS Score 2  MEWS Score Color Yellow  Assess: if the MEWS score is Yellow or Red  Were vital signs accurate and taken at a resting state? Yes  Does the patient meet 2 or more of the SIRS criteria? Yes  Does the patient have a confirmed or suspected source of infection? Yes  MEWS guidelines implemented  Yes, yellow  Treat  MEWS Interventions Considered administering scheduled or prn medications/treatments as ordered  Take Vital Signs  Increase Vital Sign Frequency  Yellow: Q2hr x1, continue Q4hrs until patient remains green for 12hrs  Escalate  MEWS: Escalate Yellow: Discuss with charge nurse and consider notifying provider and/or RRT  Notify: Charge Nurse/RN  Name of Charge Nurse/RN Notified Yoko  Provider Notification  Provider Name/Title Joycelyn Das  Date Provider Notified 07/21/23  Time Provider Notified [phone]  Method of Notification Page (secure chat)  Notification Reason Change in status  Provider response No new orders  Date of Provider Response 07/21/23  Time of Provider Response 0855  Assess: SIRS CRITERIA  SIRS Temperature  1  SIRS Pulse 1  SIRS Respirations  0  SIRS WBC 1  SIRS Score Sum  3

## 2023-07-21 NOTE — Progress Notes (Signed)
Subjective:  Denies SSCP, palpitations or Dyspnea Abdomen still distended and tender   Objective:  Vitals:   07/21/23 0049 07/21/23 0154 07/21/23 0255 07/21/23 0503  BP: 120/78   105/71  Pulse: 88 97 99 93  Resp:    16  Temp:  (!) 100.6 F (38.1 C) 100.1 F (37.8 C) 99 F (37.2 C)  TempSrc:  Axillary Oral Oral  SpO2:  95% 96% 96%  Weight:      Height:        Intake/Output from previous day:  Intake/Output Summary (Last 24 hours) at 07/21/2023 0825 Last data filed at 07/21/2023 0600 Gross per 24 hour  Intake 1430.85 ml  Output --  Net 1430.85 ml    Physical Exam:  [place] male in no distress Lungs clear No murmur  Abdomen mildly distended with no rebound Tympanitic No edema Palpable pedal pulses   Lab Results: Basic Metabolic Panel: Recent Labs    07/19/23 2142 07/20/23 0001  NA 129*  --   K 4.1  --   CL 98  --   CO2 20*  --   GLUCOSE 133*  --   BUN 15  --   CREATININE 0.78  --   CALCIUM 8.5*  --   MG  --  2.2   Liver Function Tests: Recent Labs    07/19/23 2142  AST 32  ALT 25  ALKPHOS 84  BILITOT 3.3*  PROT 7.5  ALBUMIN 3.8   Recent Labs    07/19/23 2142  LIPASE 13   CBC: Recent Labs    07/20/23 1757 07/21/23 0131  WBC 18.1* 17.5*  HGB 12.8* 12.6*  HCT 37.9* 39.4  MCV 88.8 92.3  PLT 186 210   Cardiac Enzymes: Recent Labs    07/20/23 0001  CKTOTAL 151   BNP: Invalid input(s): "POCBNP" D-Dimer: Recent Labs    07/19/23 2142  DDIMER 1.14*   Hemoglobin A1C: No results for input(s): "HGBA1C" in the last 72 hours. Fasting Lipid Panel: Recent Labs    07/21/23 0131  CHOL 126  HDL 40*  LDLCALC 64  TRIG 829  CHOLHDL 3.2     Imaging: Imaging results have been reviewed  Cardiac Studies:  ECG: SR no acute ST changes   Telemetry:  NSR   Echo:   Medications:    aspirin EC  81 mg Oral Daily   escitalopram  10 mg Oral Daily   metoprolol tartrate  25 mg Oral BID   multivitamin with minerals  1 tablet Oral  Daily   pantoprazole (PROTONIX) IV  40 mg Intravenous Q24H   polyethylene glycol  17 g Oral Daily      sodium chloride 75 mL/hr at 07/20/23 1904   heparin 1,650 Units/hr (07/21/23 0304)   piperacillin-tazobactam (ZOSYN)  IV 3.375 g (07/21/23 0507)    Assessment/Plan:   SEMI:  no chest pain or acute ECG changes TTE pending He has low grade fever and likely infectious colitis. Not quite ready for heart cath Continue lopressor ASA and heparin will tentatively try cath Wednesday pending clinical course with abdomen   Shared Decision Making/Informed Consent The risks [stroke (1 in 1000), death (1 in 1000), kidney failure [usually temporary] (1 in 500), bleeding (1 in 200), allergic reaction [possibly serious] (1 in 200)], benefits (diagnostic support and management of coronary artery disease) and alternatives of a cardiac catheterization were discussed in detail with Ms. Wanita Chamberlain and she is willing to proceed.  Charlton Haws  2. Colitis:  WBC  elevated abdomen distended and tender CT consistent with infectious colitis continue Zosyn per primary service   07/21/2023, 8:25 AM

## 2023-07-21 NOTE — Plan of Care (Signed)
  Problem: Clinical Measurements: Goal: Ability to maintain clinical measurements within normal limits will improve Outcome: Progressing Goal: Cardiovascular complication will be avoided Outcome: Progressing   Problem: Nutrition: Goal: Adequate nutrition will be maintained Outcome: Progressing   Problem: Coping: Goal: Level of anxiety will decrease Outcome: Progressing   Problem: Pain Managment: Goal: General experience of comfort will improve Outcome: Progressing   Problem: Safety: Goal: Ability to remain free from injury will improve Outcome: Progressing

## 2023-07-21 NOTE — Plan of Care (Signed)

## 2023-07-21 NOTE — Progress Notes (Signed)
ANTICOAGULATION CONSULT NOTE Pharmacy Consult for Heparin Indication: chest pain/ACS Brief A/P: Heparin level subtherapeutic Increase Heparin rate  No Known Allergies  Patient Measurements: Height: 5' 0.63" (154 cm) Weight: 74.8 kg (165 lb) IBW/kg (Calculated) : 51.45 Heparin Dosing Weight: 70 kg  Vital Signs: Temp: 100.6 F (38.1 C) (07/29 0154) Temp Source: Axillary (07/29 0154) BP: 120/78 (07/29 0049) Pulse Rate: 97 (07/29 0154)  Labs: Recent Labs    07/19/23 2142 07/20/23 0001 07/20/23 0821 07/20/23 1757 07/20/23 1910 07/20/23 2102 07/21/23 0131  HGB 13.9  --   --  12.8*  --   --  12.6*  HCT 39.9  --   --  37.9*  --   --  39.4  PLT 184  --   --  186  --   --  210  HEPARINUNFRC  --   --  <0.10* <0.10*  --   --  <0.10*  CREATININE 0.78  --   --   --   --   --   --   CKTOTAL  --  151  --   --   --   --   --   TROPONINIHS 924* 725*  --   --  428* 439*  --     Estimated Creatinine Clearance: 91.8 mL/min (by C-G formula based on SCr of 0.78 mg/dL).  Assessment: 52 y.o. male with chest pain for heparin   Goal of Therapy:  Heparin level 0.3-0.7 units/ml Monitor platelets by anticoagulation protocol: Yes   Plan:  Heparin 2000 units IV bolus, then increase heparin 1650 units/hr  Check heparin level in 6 hours.   Geannie Risen, PharmD, BCPS  07/21/2023 2:41 AM

## 2023-07-21 NOTE — Progress Notes (Addendum)
CSW spoke with patient using pacific interpreter Duwayne Heck [phone] . Danielle unable to reach patient. Update- CSW spoke with patient at bedside using spanish interpretor. CSW  offered patient outpatient substance use treatment services resources. Patient politely declined. Patient reports he will have transportation when medically ready for dc. All questions answered. No further questions reported at this time.

## 2023-07-21 NOTE — Progress Notes (Signed)
PROGRESS NOTE    Carl Briggs  NFA:[id number] DOB: [birth year] DOA: 07/19/2023 PCP: Patient, No Pcp Per    Brief Narrative:  Carl Briggs is a 52 y.o. male with past medical history of hypertension, hyperlipidemia, anxiety disorder presented to [facility] ED  with complaints of diffuse abdominal pain and bloating.  He also reported heaviness in the chest like pressures with intermittent palpitations.  Had not taken his blood pressure medications for 3 days prior to presentation.  Patient had temp of 101.2 F at outside ED. On arrival in the ED temperature was negative for  99.4, was mildly tachycardic at 108, respiratory rate 18, BP 122/88.  Significant Labs  revealed WBC 18.4, sodium 129,  Troponin 924->725, EKG with sinus tachycardia and QTc 435 ms.  Patient was then admitted hospital for further evaluation and treatment.  Assessment and plan  Abdominal pain/colitis:  History of recent constipation. CT findings indicative of infectious versus inflammatory terminal ileitis/ascending colitis.  Patient did have leukocytosis and fever, so will continue antibiotics with Zosyn.  Never had a colonoscopy in the past.  Follow-up blood culture sent -pending.  COVID-19 was negative.  Lipase was within normal range.   Non-STEMI:  Noncompliant with antihypertensives at home.  Last echo in 2022 showed mild LVH with EF 65 to 70%.  EKG showed sinus tachycardia with ST segment changes.  Cardiology was consulted and recommended IV heparin, baby aspirin and nitro as needed for now.  Check 2D echocardiogram  for wall motion abnormality.   Cardiology has been consulted for possible cardiac catheterization due to fever and plan for cardiac cath on Wednesday.  Has been started on full liquids. CTA chest negative for PE though D dimer was elevated.  Hypertension:  Noncompliant with lisinopril. Holding  lisinopril and on metoprolol   Hyperlipidemia:  lipid profile with LDL of 64. Not on meds.     Anxiety: was on lexapro in the past, none recently   Remote history of smoking and alcohol use:  Monitor only, states that he quit a few years back.     DVT prophylaxis: heparin bolus via infusion 1,000 Units Start: 07/21/23 1145   Code Status:     Code Status: Full Code  Disposition: Home likely in 2 to 3 days  Status is: Inpatient  Remains inpatient appropriate because: Colitis, non-ST elevation MI and plan for cardiac cath   Family Communication: None at bedside  Consultants:  Cardiology  Procedures:  Plan for cardiac cath  Antimicrobials:  Zosyn IV  Anti-infectives (From admission, onward)    Start     Dose/Rate Route Frequency Ordered Stop   07/20/23 0800  piperacillin-tazobactam (ZOSYN) IVPB 3.375 g        3.375 g 12.5 mL/hr over 240 Minutes Intravenous Every 8 hours 07/20/23 0144     07/20/23 0115  piperacillin-tazobactam (ZOSYN) IVPB 3.375 g        3.375 g 12.5 mL/hr over 240 Minutes Intravenous  Once 07/20/23 0102 07/20/23 0520        Subjective: Today, patient was seen and examined at bedside.  Patient complains of headache, mild chest discomfort, no shortness of breath but has some nausea.  Feels constipated.  Objective: Vitals:   07/21/23 0503 07/21/23 0826 07/21/23 0927 07/21/23 1036  BP: 105/71 126/76  100/68  Pulse: 93 (!) 101  78  Resp: 16 19  18   Temp: 99 F (37.2 C) (!) 102.1 F (38.9 C) 98.9 F (37.2 C) 99 F (37.2 C)  TempSrc:  Oral Oral Oral Oral  SpO2: 96% 93%  98%  Weight:      Height:        Intake/Output Summary (Last 24 hours) at 07/21/2023 1109 Last data filed at 07/21/2023 0600 Gross per 24 hour  Intake 1430.85 ml  Output --  Net 1430.85 ml   Filed Weights   07/19/23 2205  Weight: 74.8 kg    Physical Examination: Body mass index is 31.56 kg/m.   General: Obese built, not in obvious distress HENT:   No scleral pallor or icterus noted. Oral mucosa is moist.  Chest:  Clear breath sounds. No crackles or wheezes.   CVS: S1 &S2 heard. No murmur.  Regular rate and rhythm. Abdomen: Soft, nontender, mildly distended bowel sounds are heard.   Extremities: No cyanosis, clubbing or edema.  Peripheral pulses are palpable. Psych: Alert, awake and oriented, normal mood CNS:  No cranial nerve deficits.  Power equal in all extremities.   Skin: Warm and dry.  No rashes noted.  Data Reviewed:   CBC: Recent Labs  Lab 07/19/23 2142 07/20/23 1757 07/21/23 0131  WBC 18.4* 18.1* 17.5*  HGB 13.9 12.8* 12.6*  HCT 39.9 37.9* 39.4  MCV 86.6 88.8 92.3  PLT 184 186 210    Basic Metabolic Panel: Recent Labs  Lab 07/19/23 2142 07/20/23 0001  NA 129*  --   K 4.1  --   CL 98  --   CO2 20*  --   GLUCOSE 133*  --   BUN 15  --   CREATININE 0.78  --   CALCIUM 8.5*  --   MG  --  2.2    Liver Function Tests: Recent Labs  Lab 07/19/23 2142  AST 32  ALT 25  ALKPHOS 84  BILITOT 3.3*  PROT 7.5  ALBUMIN 3.8     Radiology Studies: CT Head Wo Contrast  Result Date: 07/20/2023 CLINICAL DATA:  Headache EXAM: CT HEAD WITHOUT CONTRAST TECHNIQUE: Contiguous axial images were obtained from the base of the skull through the vertex without intravenous contrast. RADIATION DOSE REDUCTION: This exam was performed according to the departmental dose-optimization program which includes automated exposure control, adjustment of the mA and/or kV according to patient size and/or use of iterative reconstruction technique. COMPARISON:  08/11/21 FINDINGS: Brain: There is no mass, hemorrhage or extra-axial collection. The size and configuration of the ventricles and extra-axial CSF spaces are normal. The brain parenchyma is normal, without acute or chronic infarction. Vascular: No abnormal hyperdensity of the major intracranial arteries or dural venous sinuses. No intracranial atherosclerosis. Skull: Unchanged soft tissue lesion at the left anterior scalp. Sinuses/Orbits: No fluid levels or advanced mucosal thickening of the  visualized paranasal sinuses. No mastoid or middle ear effusion. The orbits are normal. IMPRESSION: 1. No acute intracranial abnormality. Electronically Signed   By: Deatra Robinson M.D.   On: 07/20/2023 02:14   CT ABDOMEN PELVIS W CONTRAST  Result Date: 07/20/2023 CLINICAL DATA:  PE suspected, high probability, abdominal pain, postop EXAM: CT ANGIOGRAPHY CHEST CT ABDOMEN AND PELVIS WITH CONTRAST TECHNIQUE: Multidetector CT imaging of the chest was performed using the standard protocol during bolus administration of intravenous contrast. Multiplanar CT image reconstructions and MIPs were obtained to evaluate the vascular anatomy. Multidetector CT imaging of the abdomen and pelvis was performed using the standard protocol during bolus administration of intravenous contrast. RADIATION DOSE REDUCTION: This exam was performed according to the departmental dose-optimization program which includes automated exposure control, adjustment of the mA and/or kV  according to patient size and/or use of iterative reconstruction technique. CONTRAST:  OMNIPAQUE IOHEXOL 350 MG/ML SOLN COMPARISON:  CTA chest 08/11/2021 and CT abdomen and pelvis 09/21/2020 FINDINGS: CTA CHEST FINDINGS Cardiovascular: Negative for acute pulmonary embolism. Normal caliber thoracic aorta. No pericardial effusion. Mediastinum/Nodes: Trachea and esophagus are unremarkable. No thoracic adenopathy Lungs/Pleura: Bilateral lower lobe atelectasis. Calcified granuloma left upper lobe. No pleural effusion or pneumothorax. Musculoskeletal: No acute fracture. Review of the MIP images confirms the above findings. CT ABDOMEN and PELVIS FINDINGS Hepatobiliary: Cholelithiasis. No evidence of cholecystitis. No biliary dilation. Unremarkable liver. Pancreas: Unremarkable. Spleen: Unremarkable. Adrenals/Urinary Tract: Normal adrenal glands. No urinary calculi or hydronephrosis. Unremarkable bladder. Stomach/Bowel: Inflammatory fat stranding centered about the  terminal ileum with mild colonic and terminal ileal wall thickening. Reactive hyperemia of the normal caliber appendix. Scattered colonic diverticula. No evidence of obstruction. Stomach is within normal limits. Vascular/Lymphatic: Prominent subcentimeter ileocolic lymph nodes on the right are likely reactive. No acute vascular abnormality. Reproductive: No acute abnormality. Other: Small amount of free fluid in the right pericolic gutter. No free intraperitoneal air. Musculoskeletal: No acute fracture. Review of the MIP images confirms the above findings. IMPRESSION: 1. Negative for acute pulmonary embolism. 2. Constellation of findings favored to represent infectious or inflammatory terminal ileitis/ascending colitis. No abscess or perforation. Electronically Signed   By: Minerva Fester M.D.   On: 07/20/2023 00:24   CT Angio Chest PE W and/or Wo Contrast  Result Date: 07/20/2023 CLINICAL DATA:  PE suspected, high probability, abdominal pain, postop EXAM: CT ANGIOGRAPHY CHEST CT ABDOMEN AND PELVIS WITH CONTRAST TECHNIQUE: Multidetector CT imaging of the chest was performed using the standard protocol during bolus administration of intravenous contrast. Multiplanar CT image reconstructions and MIPs were obtained to evaluate the vascular anatomy. Multidetector CT imaging of the abdomen and pelvis was performed using the standard protocol during bolus administration of intravenous contrast. RADIATION DOSE REDUCTION: This exam was performed according to the departmental dose-optimization program which includes automated exposure control, adjustment of the mA and/or kV according to patient size and/or use of iterative reconstruction technique. CONTRAST:  OMNIPAQUE IOHEXOL 350 MG/ML SOLN COMPARISON:  CTA chest 08/11/2021 and CT abdomen and pelvis 09/21/2020 FINDINGS: CTA CHEST FINDINGS Cardiovascular: Negative for acute pulmonary embolism. Normal caliber thoracic aorta. No pericardial effusion.  Mediastinum/Nodes: Trachea and esophagus are unremarkable. No thoracic adenopathy Lungs/Pleura: Bilateral lower lobe atelectasis. Calcified granuloma left upper lobe. No pleural effusion or pneumothorax. Musculoskeletal: No acute fracture. Review of the MIP images confirms the above findings. CT ABDOMEN and PELVIS FINDINGS Hepatobiliary: Cholelithiasis. No evidence of cholecystitis. No biliary dilation. Unremarkable liver. Pancreas: Unremarkable. Spleen: Unremarkable. Adrenals/Urinary Tract: Normal adrenal glands. No urinary calculi or hydronephrosis. Unremarkable bladder. Stomach/Bowel: Inflammatory fat stranding centered about the terminal ileum with mild colonic and terminal ileal wall thickening. Reactive hyperemia of the normal caliber appendix. Scattered colonic diverticula. No evidence of obstruction. Stomach is within normal limits. Vascular/Lymphatic: Prominent subcentimeter ileocolic lymph nodes on the right are likely reactive. No acute vascular abnormality. Reproductive: No acute abnormality. Other: Small amount of free fluid in the right pericolic gutter. No free intraperitoneal air. Musculoskeletal: No acute fracture. Review of the MIP images confirms the above findings. IMPRESSION: 1. Negative for acute pulmonary embolism. 2. Constellation of findings favored to represent infectious or inflammatory terminal ileitis/ascending colitis. No abscess or perforation. Electronically Signed   By: Minerva Fester M.D.   On: 07/20/2023 00:24   DG Chest Port 1 View  Result Date: 07/19/2023 CLINICAL DATA:  Chest pain EXAM: PORTABLE CHEST 1 VIEW COMPARISON:  CTA chest 08/11/2021 FINDINGS: Low lung volumes accentuate cardiomediastinal silhouette and pulmonary vascularity. Bibasilar atelectasis. Nodule in the left mid lung was previously seen on CT 08/11/2021 and is compatible with a benign calcified granuloma. No pleural effusion or pneumothorax. No displaced rib fractures. IMPRESSION: Low lung volumes and  basilar atelectasis. Electronically Signed   By: Minerva Fester M.D.   On: 07/19/2023 23:05      LOS: 1 day    Joycelyn Das, MD [facility] Available via Epic secure chat 7am-7pm After these hours, please refer to coverage provider listed on [email or web address] 07/21/2023, 11:09 AM

## 2023-07-21 NOTE — Progress Notes (Signed)
ANTICOAGULATION CONSULT NOTE Pharmacy Consult for Heparin Indication: chest pain/ACS  No Known Allergies  Patient Measurements: Height: 5' 0.63" (154 cm) Weight: 74.8 kg (165 lb) IBW/kg (Calculated) : 51.45 Heparin Dosing Weight: 70 kg  Vital Signs: Temp: 99 F (37.2 C) (07/29 0503) Temp Source: Oral (07/29 0503) BP: 105/71 (07/29 0503) Pulse Rate: 93 (07/29 0503)  Labs: Recent Labs    07/19/23 2142 07/20/23 0001 07/20/23 0821 07/20/23 1757 07/20/23 1910 07/20/23 2102 07/21/23 0131  HGB 13.9  --   --  12.8*  --   --  12.6*  HCT 39.9  --   --  37.9*  --   --  39.4  PLT 184  --   --  186  --   --  210  HEPARINUNFRC  --   --  <0.10* <0.10*  --   --  <0.10*  CREATININE 0.78  --   --   --   --   --   --   CKTOTAL  --  151  --   --   --   --   --   TROPONINIHS 924* 725*  --   --  428* 439*  --     Estimated Creatinine Clearance: 91.8 mL/min (by C-G formula based on SCr of 0.78 mg/dL).  Assessment: 52 yo M presenting with chest pain with elevated troponin and D-dimer, found to have NSTEMI. Cardiology plan to proceed with LHC once active infection/inflammation improves. Pharmacy consulted to dose heparin.   Heparin level 0.21 is subtherapeutic on heparin 1650 units/hr. Hgb (12.6) and PLTs (210) are stable. No issues with the line or signs of bleeding per RN.   Goal of Therapy:  Heparin level 0.3-0.7 units/ml Monitor platelets by anticoagulation protocol: Yes   Plan:  Give heparin 1000 unit bolus x1 Increase heparin to 1800 units/hr Draw 6 hour heparin level Monitor daily HL, CBC, and signs/sx of bleeding F/u plan for [facility], PharmD PGY1 Pharmacy Resident  Please check AMION for all [facility] Pharmacy phone numbers After 10:00 PM, call Main Pharmacy [phone]

## 2023-07-21 NOTE — Progress Notes (Signed)
Echocardiogram 2D Echocardiogram has been performed.  Carl Briggs 07/21/2023, 12:28 PM

## 2023-07-21 NOTE — Progress Notes (Addendum)
   07/20/23 2027  Vitals  Pulse Rate (!) 107  ECG Heart Rate (!) 110  MEWS COLOR  MEWS Score Color Green  Oxygen Therapy  SpO2 94 %  MEWS Score  MEWS Temp 0  MEWS Systolic 0  MEWS Pulse 1  MEWS RR 0  MEWS LOC 0  MEWS Score 1  Provider Notification  Provider Name/Title Gery Pray, MD  Date Provider Notified 07/20/23  Time Provider Notified 2032  Method of Notification Page [facility] chat)  Notification Reason Critical Result  Test performed and critical result Troponin  Date Critical Result Received 07/20/23  Time Critical Result Received 2027  Provider response See new orders  Date of Provider Response 07/20/23  Time of Provider Response 2034

## 2023-07-21 NOTE — Progress Notes (Signed)
ANTICOAGULATION CONSULT NOTE Pharmacy Consult for Heparin Indication: chest pain/ACS  No Known Allergies  Patient Measurements: Height: 5' 0.63" (154 cm) Weight: 74.8 kg (165 lb) IBW/kg (Calculated) : 51.45 Heparin Dosing Weight: 70 kg  Vital Signs: Temp: 101.5 F (38.6 C) (07/29 1753) Temp Source: Oral (07/29 1753) BP: 111/79 (07/29 1753) Pulse Rate: 95 (07/29 1753)  Labs: Recent Labs    07/19/23 2142 07/20/23 0001 07/20/23 0821 07/20/23 1757 07/20/23 1910 07/20/23 2102 07/21/23 0131 07/21/23 0947 07/21/23 1804  HGB 13.9  --   --  12.8*  --   --  12.6*  --   --   HCT 39.9  --   --  37.9*  --   --  39.4  --   --   PLT 184  --   --  186  --   --  210  --   --   HEPARINUNFRC  --   --    < > <0.10*  --   --  <0.10* 0.21* 0.16*  CREATININE 0.78  --   --   --   --   --   --   --   --   CKTOTAL  --  151  --   --   --   --   --   --   --   TROPONINIHS 924* 725*  --   --  428* 439*  --   --   --    < > = values in this interval not displayed.    Estimated Creatinine Clearance: 91.8 mL/min (by C-G formula based on SCr of 0.78 mg/dL).  Assessment: 52 yo M presenting with chest pain with elevated troponin and D-dimer, found to have NSTEMI. Cardiology plan to proceed with LHC once active infection/inflammation improves. Pharmacy consulted to dose heparin.   7/29 PM: Heparin level 0.16 is subtherapeutic on heparin 1800 units/hr. Hgb (12.6) and PLTs (210) are stable. No issues with the line or signs of bleeding per RN.   Goal of Therapy:  Heparin level 0.3-0.7 units/ml Monitor platelets by anticoagulation protocol: Yes   Plan:  Give heparin 1000 unit bolus x1 Increase heparin to 2000 units/hr Draw 6 hour heparin level Monitor daily HL, CBC, and signs/sx of bleeding F/u plan for [facility]  Enos Fling, PharmD PGY-1 Acute Care Pharmacy Resident 07/21/2023 6:58 PM  Please check AMION for all [facility] Pharmacy phone numbers After 10:00 PM, call Main Pharmacy [phone]

## 2023-07-22 DIAGNOSIS — K529 Noninfective gastroenteritis and colitis, unspecified: Secondary | ICD-10-CM | POA: Insufficient documentation

## 2023-07-22 LAB — CBC
HCT: 34.3 % — ABNORMAL LOW (ref 39.0–52.0)
Hemoglobin: 11.5 g/dL — ABNORMAL LOW (ref 13.0–17.0)
MCH: 29.8 pg (ref 26.0–34.0)
MCHC: 33.5 g/dL (ref 30.0–36.0)
MCV: 88.9 fL (ref 80.0–100.0)
Platelets: 235 10*3/uL (ref 150–400)
RBC: 3.86 MIL/uL — ABNORMAL LOW (ref 4.22–5.81)
RDW: 14.2 % (ref 11.5–15.5)
WBC: 10.3 10*3/uL (ref 4.0–10.5)
nRBC: 0 % (ref 0.0–0.2)

## 2023-07-22 LAB — HEPARIN LEVEL (UNFRACTIONATED): Heparin Unfractionated: 0.32 IU/mL (ref 0.30–0.70)

## 2023-07-22 MED ORDER — SODIUM CHLORIDE 0.9 % WEIGHT BASED INFUSION
3.0000 mL/kg/h | INTRAVENOUS | Status: DC
  Administered 2023-07-23: 3 mL/kg/h via INTRAVENOUS

## 2023-07-22 MED ORDER — SODIUM CHLORIDE 0.9 % WEIGHT BASED INFUSION
1.0000 mL/kg/h | INTRAVENOUS | Status: DC
  Administered 2023-07-23: 1 mL/kg/h via INTRAVENOUS

## 2023-07-22 NOTE — Progress Notes (Signed)
ANTICOAGULATION CONSULT NOTE Pharmacy Consult for Heparin Indication: chest pain/ACS  No Known Allergies  Patient Measurements: Height: 5' 0.63" (154 cm) Weight: 74.8 kg (165 lb) IBW/kg (Calculated) : 51.45 Heparin Dosing Weight: 70 kg  Vital Signs: Temp: 99.7 F (37.6 C) (07/30 0623) Temp Source: Oral (07/30 0623) BP: 101/73 (07/30 0623) Pulse Rate: 85 (07/30 0511)  Labs: Recent Labs    07/19/23 2142 07/20/23 0001 07/20/23 0821 07/20/23 1757 07/20/23 1910 07/20/23 2102 07/21/23 0131 07/21/23 0947 07/21/23 1804 07/22/23 0112  HGB 13.9  --   --  12.8*  --   --  12.6*  --   --  11.4*  HCT 39.9  --   --  37.9*  --   --  39.4  --   --  33.9*  PLT 184  --   --  186  --   --  210  --   --  182  HEPARINUNFRC  --   --    < > <0.10*  --   --  <0.10* 0.21* 0.16* 0.35  CREATININE 0.78  --   --   --   --   --   --   --   --  0.84  CKTOTAL  --  151  --   --   --   --   --   --   --   --   TROPONINIHS 924* 725*  --   --  428* 439*  --   --   --   --    < > = values in this interval not displayed.    Estimated Creatinine Clearance: 87.5 mL/min (by C-G formula based on SCr of 0.84 mg/dL).  Assessment: 52 yo M presenting with chest pain with elevated troponin and D-dimer, found to have NSTEMI. Cardiology plan to proceed with LHC once active infection/inflammation improves. Pharmacy consulted to dose heparin.   Heparin level 0.32 is therapeutic but trending down on heparin 2000 units/hr. Hgb (11.4) and PLTs (182) are stable. No issues with the line or signs of bleeding per RN. Will slightly increase to maintain therapeutic levels.   Goal of Therapy:  Heparin level 0.3-0.7 units/ml Monitor platelets by anticoagulation protocol: Yes   Plan:  Increase heparin to 2100 units/hr Monitor daily HL, CBC, and signs/sx of bleeding F/u plan for LHC (7/31?)  Romie Minus, PharmD PGY1 Pharmacy Resident  Please check AMION for all [facility] Pharmacy phone numbers After 10:00 PM, call  Main Pharmacy [phone]

## 2023-07-22 NOTE — Progress Notes (Signed)
ANTICOAGULATION CONSULT NOTE Pharmacy Consult for Heparin Indication: chest pain/ACS  No Known Allergies  Patient Measurements: Height: 5' 0.63" (154 cm) Weight: 74.8 kg (165 lb) IBW/kg (Calculated) : 51.45 Heparin Dosing Weight: 70 kg  Vital Signs: Temp: 98.4 F (36.9 C) (07/29 2048) Temp Source: Oral (07/29 2048) BP: 115/78 (07/29 2048) Pulse Rate: 86 (07/29 2048)  Labs: Recent Labs    07/19/23 2142 07/20/23 0001 07/20/23 0821 07/20/23 1757 07/20/23 1910 07/20/23 2102 07/21/23 0131 07/21/23 0947 07/21/23 1804 07/22/23 0112  HGB 13.9  --   --  12.8*  --   --  12.6*  --   --  11.4*  HCT 39.9  --   --  37.9*  --   --  39.4  --   --  33.9*  PLT 184  --   --  186  --   --  210  --   --  182  HEPARINUNFRC  --   --    < > <0.10*  --   --  <0.10* 0.21* 0.16* 0.35  CREATININE 0.78  --   --   --   --   --   --   --   --  0.84  CKTOTAL  --  151  --   --   --   --   --   --   --   --   TROPONINIHS 924* 725*  --   --  428* 439*  --   --   --   --    < > = values in this interval not displayed.    Estimated Creatinine Clearance: 87.5 mL/min (by C-G formula based on SCr of 0.84 mg/dL).  Assessment: 52 yo M presenting with chest pain with elevated troponin and D-dimer, found to have NSTEMI. Cardiology plan to proceed with LHC once active infection/inflammation improves. Pharmacy consulted to dose heparin.   7/29 PM: Heparin level 0.16 is subtherapeutic on heparin 1800 units/hr. Hgb (12.6) and PLTs (210) are stable. No issues with the line or signs of bleeding per RN.   7/30 AM update:  Heparin level therapeutic after rate increase  Goal of Therapy:  Heparin level 0.3-0.7 units/ml Monitor platelets by anticoagulation protocol: Yes   Plan:  Cont heparin 2000 units/hr 1000 heparin level  Abran Duke, PharmD, BCPS Clinical Pharmacist Phone: [phone]

## 2023-07-22 NOTE — TOC CM/SW Note (Signed)
Transition of Care [facility]) - Inpatient Brief Assessment   Patient Details  Name: Carl Briggs MRN: [id number] Date of Birth: April 13, 1971  Transition of Care [facility]) CM/SW Contact:    Gala Lewandowsky, RN Phone Number: 07/22/2023, 2:10 PM   Clinical Narrative: Patient presented for chest and abdominal pain-plan for [facility] 07-23-23. Case Manager is following for MATCH needs and hospital follow up appointment established with the [facility]. Case Manager will continue to follow for additional transition of care needs.   Transition of Care Asessment: Insurance and Status:  (No insurance) Patient has primary care physician: Yes ([facility] Clinic arranged.)  Prior/Current Home Services: No current home services Social Determinants of Health Reivew: SDOH reviewed no interventions necessary Readmission risk has been reviewed: Yes Transition of care needs: transition of care needs identified, TOC will continue to follow

## 2023-07-22 NOTE — Plan of Care (Signed)
  Problem: Clinical Measurements: Goal: Cardiovascular complication will be avoided Outcome: Progressing   Problem: Nutrition: Goal: Adequate nutrition will be maintained Outcome: Progressing   Problem: Pain Managment: Goal: General experience of comfort will improve Outcome: Progressing   Problem: Safety: Goal: Ability to remain free from injury will improve Outcome: Progressing   

## 2023-07-22 NOTE — Progress Notes (Signed)
Subjective:  Denies SSCP, palpitations or Dyspnea Abdomen still distended and tender   Objective:  Vitals:   07/21/23 1950 07/21/23 2048 07/22/23 0511 07/22/23 0623  BP: 113/73 115/78 116/71 101/73  Pulse: 84 86 85   Resp: 16 18 19 18   Temp: 98.3 F (36.8 C) 98.4 F (36.9 C) (!) 101.5 F (38.6 C) 99.7 F (37.6 C)  TempSrc: Oral Oral Oral Oral  SpO2: 95% 96% 94%   Weight:      Height:        Intake/Output from previous day:  Intake/Output Summary (Last 24 hours) at 07/22/2023 0844 Last data filed at 07/21/2023 2010 Gross per 24 hour  Intake 604.57 ml  Output 250 ml  Net 354.57 ml    Physical Exam:  [place] male in no distress Lungs clear No murmur  Abdomen mildly distended with no rebound Tympanitic No edema Palpable pedal pulses   Lab Results: Basic Metabolic Panel: Recent Labs    07/19/23 2142 07/20/23 0001 07/22/23 0112  NA 129*  --  132*  K 4.1  --  3.6  CL 98  --  105  CO2 20*  --  19*  GLUCOSE 133*  --  93  BUN 15  --  14  CREATININE 0.78  --  0.84  CALCIUM 8.5*  --  7.5*  MG  --  2.2 2.3   Liver Function Tests: Recent Labs    07/19/23 2142  AST 32  ALT 25  ALKPHOS 84  BILITOT 3.3*  PROT 7.5  ALBUMIN 3.8   Recent Labs    07/19/23 2142  LIPASE 13   CBC: Recent Labs    07/21/23 0131 07/22/23 0112  WBC 17.5* 13.8*  HGB 12.6* 11.4*  HCT 39.4 33.9*  MCV 92.3 88.5  PLT 210 182   Cardiac Enzymes: Recent Labs    07/20/23 0001  CKTOTAL 151   BNP: Invalid input(s): "POCBNP" D-Dimer: Recent Labs    07/19/23 2142  DDIMER 1.14*   Hemoglobin A1C: No results for input(s): "HGBA1C" in the last 72 hours. Fasting Lipid Panel: Recent Labs    07/21/23 0131  CHOL 126  HDL 40*  LDLCALC 64  TRIG 161  CHOLHDL 3.2     Imaging: Imaging results have been reviewed  Cardiac Studies:  ECG: SR no acute ST changes   Telemetry:  NSR   Echo:   Medications:    aspirin EC  81 mg Oral Daily   docusate sodium  100 mg Oral  BID   escitalopram  10 mg Oral Daily   metoprolol tartrate  25 mg Oral BID   multivitamin with minerals  1 tablet Oral Daily   pantoprazole (PROTONIX) IV  40 mg Intravenous Q24H   polyethylene glycol  17 g Oral Daily      heparin 2,000 Units/hr (07/21/23 2335)   piperacillin-tazobactam (ZOSYN)  IV 3.375 g (07/22/23 0508)    Assessment/Plan:   SEMI:  no chest pain or acute ECG changes TTE  EF 55-60% He has low grade fever and likely infectious colitis. Not quite ready for heart cath Continue lopressor ASA and heparin Cath in am Used video interpretor to explain procedure to patient see below willing to proceed So long as afebrile and abdomen continues to improve   Shared Decision Making/Informed Consent The risks [stroke (1 in 1000), death (1 in 1000), kidney failure [usually temporary] (1 in 500), bleeding (1 in 200), allergic reaction [possibly serious] (1 in 200)], benefits (diagnostic support and  management of coronary artery disease) and alternatives of a cardiac catheterization were discussed in detail with Ms. Carl Briggs and she is willing to proceed.  Charlton Haws  2. Colitis:  WBC elevated abdomen distended and tender CT consistent with infectious colitis continue Zosyn per primary service   07/22/2023, 8:44 AM

## 2023-07-22 NOTE — Progress Notes (Addendum)
PROGRESS NOTE    Carl Briggs  UJW:[id number] DOB: 1971-01-21 DOA: 07/19/2023 PCP: Patient, No Pcp Per    Brief Narrative:   Carl Briggs is a 52 y.o. Spanish-speaking male with past medical history of hypertension, hyperlipidemia, anxiety disorder presented to [facility] ED  with complaints of diffuse abdominal pain and bloating.  He also reported heaviness in the chest like pressures with intermittent palpitations.  Had not taken his blood pressure medications for 3 days prior to presentation.  Patient had temp of 101.2 F at outside ED. On arrival in the ED temperature was 99.4 F, was mildly tachycardic at 108/min, respiratory rate 18/min, BP 122/88.  Significant Labs  revealed WBC 18.4, sodium 129,  Troponin 924->725, EKG with sinus tachycardia and QTc 435 ms.  Patient was then admitted hospital for further evaluation and treatment.  During hospitalization, CT scan of the abdomen pelvis showed some ascending colitis.  Was started on IV Zosyn.  Cardiology was consulted due to elevated troponins without ST segment changes.  At this time plan for cardiac catheterization on 07/23/2023  Assessment and plan  Sepsis secondary to colitis:  Abdominal pain.  Patient patient had tachycardia, significant leukocytosis with fever and possible source of infection as colitis suggestive of sepsis. History of recent constipation. CT findings indicative of infectious versus inflammatory terminal ileitis/ascending colitis.  Patient did have leukocytosis and fever, so on Zosyn.  Never had a colonoscopy in the past.  Temperature max of 101.5 F.  Will send blood cultures suggestive unable to see in the computer.  COVID-19 was negative.  Lipase was within normal range.  Will continue bowel regimen for constipation.   Non-STEMI:  Noncompliant with antihypertensives at home. CTA chest negative for PE though D dimer was elevated.  Last echo in 2022 showed mild LVH with EF 65 to 70%.  EKG showed sinus  tachycardia with ST segment changes.  Cardiology was consulted and recommended IV heparin, baby aspirin and nitro as needed for now.   2D echocardiogram on 07/21/2023 showed LV ejection fraction of 55 to 60% with grade 1 diastolic dysfunction.  Cardiology on board and due to fever plan for cardiac cath on Wednesday. on full liquids.  Hypertension:  Noncompliant with lisinopril. Holding  lisinopril and on metoprolol   Mild hyponatremia. Will check BMP in am. Latest sodium of 132.  Hyperlipidemia:  lipid profile with LDL of 64. Not on meds.    Anxiety: was on lexapro in the past, none recently   Remote history of smoking and alcohol use:  Monitor only, states that he quit a few years back.     DVT prophylaxis:    Code Status:     Code Status: Full Code  Disposition: Home likely in 2 to 3 days, awaiting for cardiac cath  Status is: Inpatient  Remains inpatient appropriate because: Colitis, non-ST elevation MI and plan for cardiac cath   Family Communication: None at bedside  Consultants:  Cardiology  Procedures:  None yet  Antimicrobials:  Zosyn IV 7/28>  Anti-infectives (From admission, onward)    Start     Dose/Rate Route Frequency Ordered Stop   07/20/23 0800  piperacillin-tazobactam (ZOSYN) IVPB 3.375 g        3.375 g 12.5 mL/hr over 240 Minutes Intravenous Every 8 hours 07/20/23 0144     07/20/23 0115  piperacillin-tazobactam (ZOSYN) IVPB 3.375 g        3.375 g 12.5 mL/hr over 240 Minutes Intravenous  Once 07/20/23 0102 07/20/23 0520  Subjective:  Today, patient was seen and examined at bedside.  Communicated with the help of Spanish interpreter today.  Patient complains of mild discomfort in the abdomen but no nausea or vomiting.  Denies any urinary urgency and frequency.  Had a little bit of bowel movement but not much.  Denies any shortness of breath, chest pain, or dyspnea.  Objective: Vitals:   07/21/23 2048 07/22/23 0511 07/22/23 0623 07/22/23 1033   BP: 115/78 116/71 101/73 110/72  Pulse: 86 85  72  Resp: 18 19 18    Temp: 98.4 F (36.9 C) (!) 101.5 F (38.6 C) 99.7 F (37.6 C)   TempSrc: Oral Oral Oral   SpO2: 96% 94%    Weight:      Height:        Intake/Output Summary (Last 24 hours) at 07/22/2023 1337 Last data filed at 07/22/2023 1130 Gross per 24 hour  Intake 844.26 ml  Output 250 ml  Net 594.26 ml   Filed Weights   07/19/23 2205  Weight: 74.8 kg    Physical Examination: Body mass index is 31.56 kg/m.   General: Obese built, not in obvious distress HENT:   No scleral pallor or icterus noted. Oral mucosa is moist.  Chest:  Clear breath sounds.  CVS: S1 &S2 heard. No murmur.  Regular rate and rhythm. Abdomen: Soft, nontender, mildly distended bowel sounds, Extremities: No cyanosis, clubbing or edema.  Peripheral pulses are palpable. Psych: Alert, awake and oriented, normal mood CNS:  No cranial nerve deficits.  Power equal in all extremities.   Skin: Warm and dry.  No rashes noted.  Data Reviewed:   CBC: Recent Labs  Lab 07/19/23 2142 07/20/23 1757 07/21/23 0131 07/22/23 0112  WBC 18.4* 18.1* 17.5* 13.8*  HGB 13.9 12.8* 12.6* 11.4*  HCT 39.9 37.9* 39.4 33.9*  MCV 86.6 88.8 92.3 88.5  PLT 184 186 210 182    Basic Metabolic Panel: Recent Labs  Lab 07/19/23 2142 07/20/23 0001 07/22/23 0112  NA 129*  --  132*  K 4.1  --  3.6  CL 98  --  105  CO2 20*  --  19*  GLUCOSE 133*  --  93  BUN 15  --  14  CREATININE 0.78  --  0.84  CALCIUM 8.5*  --  7.5*  MG  --  2.2 2.3    Liver Function Tests: Recent Labs  Lab 07/19/23 2142  AST 32  ALT 25  ALKPHOS 84  BILITOT 3.3*  PROT 7.5  ALBUMIN 3.8     Radiology Studies: ECHOCARDIOGRAM COMPLETE  Result Date: 07/21/2023    ECHOCARDIOGRAM REPORT   Patient Name:   Carl Briggs Date of Exam: 07/21/2023 Medical Rec #:  [id number]             Height:       60.6 in Accession #:    [id number]            Weight:       165.0 lb Date of Birth:   December 23, 1971             BSA:          1.733 m Patient Age:    52 years              BP:           100/68 mmHg Patient Gender: M  HR:           83 bpm. Exam Location:  Inpatient Procedure: 2D Echo, Color Doppler, Cardiac Doppler and Intracardiac            Opacification Agent Indications:    Chest Pain  History:        Patient has prior history of Echocardiogram examinations, most                 recent 08/11/2021. Risk Factors:Hypertension and Dyslipidemia.  Sonographer:    Raeford Razor Referring Phys: [id number] Alessandra Bevels IMPRESSIONS  1. Left ventricular ejection fraction, by estimation, is 55 to 60%. The left ventricle has normal function. The left ventricle has no regional wall motion abnormalities. Left ventricular diastolic parameters are consistent with Grade I diastolic dysfunction (impaired relaxation).  2. Right ventricular systolic function is normal. The right ventricular size is normal. There is normal pulmonary artery systolic pressure. The estimated right ventricular systolic pressure is 26.0 mmHg.  3. Left atrial size was mildly dilated.  4. The mitral valve is normal in structure. No evidence of mitral valve regurgitation. No evidence of mitral stenosis.  5. The aortic valve is tricuspid. Aortic valve regurgitation is not visualized. No aortic stenosis is present.  6. The inferior vena cava is normal in size with greater than 50% respiratory variability, suggesting right atrial pressure of 3 mmHg. FINDINGS  Left Ventricle: Left ventricular ejection fraction, by estimation, is 55 to 60%. The left ventricle has normal function. The left ventricle has no regional wall motion abnormalities. Definity contrast agent was given IV to delineate the left ventricular  endocardial borders. The left ventricular internal cavity size was normal in size. There is no left ventricular hypertrophy. Left ventricular diastolic parameters are consistent with Grade I diastolic dysfunction (impaired  relaxation). Right Ventricle: The right ventricular size is normal. No increase in right ventricular wall thickness. Right ventricular systolic function is normal. There is normal pulmonary artery systolic pressure. The tricuspid regurgitant velocity is 2.40 m/s, and  with an assumed right atrial pressure of 3 mmHg, the estimated right ventricular systolic pressure is 26.0 mmHg. Left Atrium: Left atrial size was mildly dilated. Right Atrium: Right atrial size was normal in size. Pericardium: There is no evidence of pericardial effusion. Mitral Valve: The mitral valve is normal in structure. No evidence of mitral valve regurgitation. No evidence of mitral valve stenosis. Tricuspid Valve: The tricuspid valve is normal in structure. Tricuspid valve regurgitation is trivial. Aortic Valve: The aortic valve is tricuspid. Aortic valve regurgitation is not visualized. No aortic stenosis is present. Aortic valve peak gradient measures 4.8 mmHg. Pulmonic Valve: The pulmonic valve was normal in structure. Pulmonic valve regurgitation is not visualized. Aorta: The aortic root is normal in size and structure. Venous: The inferior vena cava is normal in size with greater than 50% respiratory variability, suggesting right atrial pressure of 3 mmHg. IAS/Shunts: No atrial level shunt detected by color flow Doppler.  LEFT VENTRICLE PLAX 2D LVIDd:         4.70 cm   Diastology LVIDs:         3.00 cm   LV e' medial:    7.40 cm/s LV PW:         0.70 cm   LV E/e' medial:  10.4 LV IVS:        0.70 cm   LV e' lateral:   10.80 cm/s LVOT diam:     2.30 cm   LV E/e' lateral: 7.1 LV  SV:         79 LV SV Index:   45 LVOT Area:     4.15 cm  RIGHT VENTRICLE          IVC RV Basal diam:  3.00 cm  IVC diam: 1.20 cm LEFT ATRIUM             Index        RIGHT ATRIUM           Index LA diam:        3.60 cm 2.08 cm/m   RA Area:     14.40 cm LA Vol (A2C):   45.2 ml 26.08 ml/m  RA Volume:   32.60 ml  18.81 ml/m LA Vol (A4C):   56.0 ml 32.31 ml/m LA  Biplane Vol: 51.9 ml 29.95 ml/m  AORTIC VALVE AV Area (Vmax): 3.66 cm AV Vmax:        109.00 cm/s AV Peak Grad:   4.8 mmHg LVOT Vmax:      96.00 cm/s LVOT Vmean:     60.500 cm/s LVOT VTI:       0.189 m  AORTA Ao Root diam: 2.50 cm Ao Asc diam:  3.00 cm MITRAL VALVE               TRICUSPID VALVE MV Area (PHT): 4.60 cm    TR Peak grad:   23.0 mmHg MV Decel Time: 165 msec    TR Vmax:        240.00 cm/s MV E velocity: 76.70 cm/s MV A velocity: 86.50 cm/s  SHUNTS MV E/A ratio:  0.89        Systemic VTI:  0.19 m                            Systemic Diam: 2.30 cm Carl McleanMD Electronically signed by Wilfred Lacy Signature Date/Time: 07/21/2023/3:31:40 PM    Final       LOS: 2 days    Joycelyn Das, MD [facility] Available via Epic secure chat 7am-7pm After these hours, please refer to coverage provider listed on [email or web address] 07/22/2023, 1:37 PM

## 2023-07-23 ENCOUNTER — Encounter (HOSPITAL_COMMUNITY)
Admission: EM | Disposition: A | Discharge status: Home / Self Care | Payer: Self-pay | Source: Home / Self Care | Attending: Internal Medicine

## 2023-07-23 DIAGNOSIS — R7989 Other specified abnormal findings of blood chemistry: Secondary | ICD-10-CM

## 2023-07-23 HISTORY — PX: LEFT HEART CATH AND CORONARY ANGIOGRAPHY: CATH118249

## 2023-07-23 LAB — HEPARIN LEVEL (UNFRACTIONATED): Heparin Unfractionated: 0.49 IU/mL (ref 0.30–0.70)

## 2023-07-23 SURGERY — LEFT HEART CATH AND CORONARY ANGIOGRAPHY
Anesthesia: LOCAL

## 2023-07-23 MED ORDER — FENTANYL CITRATE (PF) 100 MCG/2ML IJ SOLN
INTRAMUSCULAR | Status: DC | PRN
  Administered 2023-07-23: 25 ug via INTRAVENOUS

## 2023-07-23 MED ORDER — SODIUM CHLORIDE 0.9 % IV SOLN: 250.0000 mL | INTRAVENOUS | Status: DC | PRN

## 2023-07-23 MED ORDER — PANTOPRAZOLE SODIUM 40 MG PO TBEC: 40.0000 mg | DELAYED_RELEASE_TABLET | Freq: Every day | ORAL | 2 refills | Status: DC

## 2023-07-23 MED ORDER — SODIUM CHLORIDE 0.9 % IV SOLN: INTRAVENOUS | Status: AC

## 2023-07-23 MED ORDER — MIDAZOLAM HCL 2 MG/2ML IJ SOLN
INTRAMUSCULAR | Status: AC
  Filled 2023-07-23: qty 2

## 2023-07-23 MED ORDER — HYDROCHLOROTHIAZIDE 12.5 MG PO TABS: 12.5000 mg | ORAL_TABLET | Freq: Every morning | ORAL | 3 refills | Status: DC

## 2023-07-23 MED ORDER — MIDAZOLAM HCL 2 MG/2ML IJ SOLN
INTRAMUSCULAR | Status: DC | PRN
  Administered 2023-07-23: 1 mg via INTRAVENOUS

## 2023-07-23 MED ORDER — FENTANYL CITRATE (PF) 100 MCG/2ML IJ SOLN
INTRAMUSCULAR | Status: AC
  Filled 2023-07-23: qty 2

## 2023-07-23 MED ORDER — SODIUM CHLORIDE 0.9% FLUSH
3.0000 mL | Freq: Two times a day (BID) | INTRAVENOUS | Status: DC
  Administered 2023-07-23: 3 mL via INTRAVENOUS

## 2023-07-23 MED ORDER — HYDRALAZINE HCL 20 MG/ML IJ SOLN: 10.0000 mg | INTRAMUSCULAR | Status: AC | PRN

## 2023-07-23 MED ORDER — DOCUSATE SODIUM 100 MG PO CAPS: 100.0000 mg | ORAL_CAPSULE | Freq: Two times a day (BID) | ORAL | 0 refills | Status: DC

## 2023-07-23 MED ORDER — ESCITALOPRAM OXALATE 10 MG PO TABS: 10.0000 mg | ORAL_TABLET | Freq: Every day | ORAL | 0 refills | Status: DC

## 2023-07-23 MED ORDER — IOHEXOL 350 MG/ML SOLN
INTRAVENOUS | Status: DC | PRN
  Administered 2023-07-23: 36 mL

## 2023-07-23 MED ORDER — FOLIC ACID 1 MG PO TABS: 1.0000 mg | ORAL_TABLET | Freq: Every day | ORAL | 0 refills | Status: AC

## 2023-07-23 MED ORDER — POTASSIUM CHLORIDE CRYS ER 20 MEQ PO TBCR
40.0000 meq | EXTENDED_RELEASE_TABLET | Freq: Once | ORAL | Status: AC
  Administered 2023-07-23: 40 meq via ORAL
  Filled 2023-07-23: qty 2

## 2023-07-23 MED ORDER — LISINOPRIL 20 MG PO TABS: 20.0000 mg | ORAL_TABLET | Freq: Every day | ORAL | 0 refills | Status: DC

## 2023-07-23 MED ORDER — HEPARIN SODIUM (PORCINE) 1000 UNIT/ML IJ SOLN
INTRAMUSCULAR | Status: DC | PRN
  Administered 2023-07-23: 4000 [IU] via INTRAVENOUS

## 2023-07-23 MED ORDER — POTASSIUM CHLORIDE CRYS ER 20 MEQ PO TBCR: 20.0000 meq | EXTENDED_RELEASE_TABLET | Freq: Every day | ORAL | 0 refills | Status: DC

## 2023-07-23 MED ORDER — LIDOCAINE HCL (PF) 1 % IJ SOLN
INTRAMUSCULAR | Status: AC
  Filled 2023-07-23: qty 30

## 2023-07-23 MED ORDER — SODIUM CHLORIDE 0.9% FLUSH: 3.0000 mL | INTRAVENOUS | Status: DC | PRN

## 2023-07-23 MED ORDER — HEPARIN SODIUM (PORCINE) 1000 UNIT/ML IJ SOLN
INTRAMUSCULAR | Status: AC
  Filled 2023-07-23: qty 10

## 2023-07-23 MED ORDER — TAMSULOSIN HCL 0.4 MG PO CAPS: 0.4000 mg | ORAL_CAPSULE | Freq: Every day | ORAL | 0 refills | Status: DC

## 2023-07-23 MED ORDER — DICYCLOMINE HCL 20 MG PO TABS: 20.0000 mg | ORAL_TABLET | Freq: Three times a day (TID) | ORAL | 0 refills | Status: DC

## 2023-07-23 MED ORDER — VERAPAMIL HCL 2.5 MG/ML IV SOLN
INTRAVENOUS | Status: DC | PRN
  Administered 2023-07-23: 10 mL via INTRA_ARTERIAL

## 2023-07-23 MED ORDER — METOPROLOL TARTRATE 25 MG PO TABS: 25.0000 mg | ORAL_TABLET | Freq: Two times a day (BID) | ORAL | 0 refills | Status: DC

## 2023-07-23 MED ORDER — ASPIRIN 81 MG PO TBEC: 81.0000 mg | DELAYED_RELEASE_TABLET | Freq: Every day | ORAL | 2 refills | Status: DC

## 2023-07-23 MED ORDER — LIDOCAINE HCL (PF) 1 % IJ SOLN
INTRAMUSCULAR | Status: DC | PRN
  Administered 2023-07-23: 5 mL via INTRADERMAL

## 2023-07-23 MED ORDER — LABETALOL HCL 5 MG/ML IV SOLN: 10.0000 mg | INTRAVENOUS | Status: AC | PRN

## 2023-07-23 MED ORDER — VERAPAMIL HCL 2.5 MG/ML IV SOLN
INTRAVENOUS | Status: AC
  Filled 2023-07-23: qty 2

## 2023-07-23 MED ORDER — HEPARIN (PORCINE) IN NACL 1000-0.9 UT/500ML-% IV SOLN
INTRAVENOUS | Status: DC | PRN
  Administered 2023-07-23 (×2): 500 mL

## 2023-07-23 MED ORDER — THIAMINE HCL 100 MG PO TABS: 100.0000 mg | ORAL_TABLET | Freq: Every day | ORAL | 0 refills | Status: DC

## 2023-07-23 MED ORDER — ORAL CARE MOUTH RINSE: 15.0000 mL | OROMUCOSAL | Status: DC | PRN

## 2023-07-23 MED ORDER — AMOXICILLIN-POT CLAVULANATE 875-125 MG PO TABS: 1.0000 | ORAL_TABLET | Freq: Two times a day (BID) | ORAL | 0 refills | Status: DC

## 2023-07-23 SURGICAL SUPPLY — 7 items
CATH 5FR JL3.5 JR4 ANG PIG MP (CATHETERS) IMPLANT
DEVICE RAD COMP TR BAND LRG (VASCULAR PRODUCTS) IMPLANT
GLIDESHEATH SLEND SS 6F .021 (SHEATH) IMPLANT
GUIDEWIRE INQWIRE 1.5J.035X260 (WIRE) IMPLANT
INQWIRE 1.5J .035X260CM (WIRE) ×1
PACK CARDIAC CATHETERIZATION (CUSTOM PROCEDURE TRAY) ×1 IMPLANT
SET ATX-X65L (MISCELLANEOUS) IMPLANT

## 2023-07-23 NOTE — Discharge Summary (Signed)
Physician Discharge Summary  Carl Briggs WUJ:[id number] DOB: [birth year] DOA: 07/19/2023  PCP: Patient, No Pcp Per  Admit date: 07/19/2023 Discharge date: 07/24/2023  Admitted From: Home  Discharge disposition: Home  Recommendations for Outpatient Follow-Up:   Follow up with your primary care provider in one week.  Check CBC, BMP, magnesium in the next visit Patient should be encouraged to take all the medications as prescribed. Patient has been prescribed a course of antibiotics for colitis..  Would benefit from colonoscopy as outpatient.   Discharge Diagnosis:   Principal Problem:   Elevated troponin Active Problems:   Colitis   Discharge Condition: Improved.  Diet recommendation: Low sodium, heart healthy.    Wound care: None.  Code status: Full.   History of Present Illness:   Carl Briggs is a 52 y.o. Spanish-speaking male with past medical history of hypertension, hyperlipidemia, anxiety disorder presented to [facility] ED  with complaints of diffuse abdominal pain and bloating.  He also reported heaviness in the chest like pressures with intermittent palpitations.  Had not taken his blood pressure medications for 3 days prior to presentation.  Patient had temp of 101.2 F at outside ED. On arrival in the ED temperature was 99.4 F, was mildly tachycardic at 108/min, respiratory rate 18/min, BP 122/88.  Significant Labs  revealed WBC 18.4, sodium 129,  Troponin 924->725, EKG with sinus tachycardia and QTc 435 ms.  Patient was then admitted hospital for further evaluation and treatment. During hospitalization, CT scan of the abdomen pelvis showed some ascending colitis.  Was started on IV Zosyn.  Cardiology was consulted due to elevated troponins without ST segment changes.    Hospital Course:   Following conditions were addressed during hospitalization as listed below,  Sepsis secondary to colitis:  Abdominal pain.   Patient patient had tachycardia,  significant leukocytosis with fever and possible source of infection as colitis suggestive of sepsis.  COVID-19 was negative.  Lipase was within normal range.  History of recent constipation. CT findings indicative of infectious versus inflammatory terminal ileitis/ascending colitis.  Patient did have leukocytosis and fever, so was continued on Zosyn during hospitalization.  He will be prescribed a Augmentin at discharge.   Will continue bowel regimen for constipation.  Patient would benefit from colonoscopy as outpatient.   Elevated troponins. Noncompliant with antihypertensives at home. CTA chest negative for PE though D dimer was elevated.  Last echo in 2022 showed mild LVH with EF 65 to 70%.  EKG showed sinus tachycardia with ST segment changes.  Cardiology was consulted and recommended IV heparin, baby aspirin and nitro initial.  2D echocardiogram on 07/21/2023 showed LV ejection fraction of 55 to 60% with grade 1 diastolic dysfunction.  Patient subsequently underwent cardiac catheterization on 07/23/2023 with findings of normal coronaries.  At this time, elevated troponin likely secondary to infection.    Hypertension:  Noncompliant with lisinopril.  Will be resumed on lisinopril  on discharge.   Mild hyponatremia.  Sodium prior to discharge was 133.  Mild hypokalemia.  Received 40 mEq of potassium prior to discharge.  Will continue potassium for the next 5 days on discharge.   Hyperlipidemia:  lipid profile with LDL of 64. Not on meds.    Anxiety: was on lexapro in the past, will resume on discharge.   Remote history of smoking and alcohol use:  Did not have any signs of withdrawal.  Continue thiamine and folic acid on discharge.  Disposition.  At this time, patient is stable for  disposition home with outpatient PCP follow-up. Spoke with the patient's daughter about disposition.  Medical Consultants:   Cardiology  Procedures:    Cardiac catheterization 07/23/2023 Subjective:    Today, patient was seen and examined at bedside.  Denies any chest pain or shortness of breath.  Complains of mild abdominal discomfort.  Discharge Exam:   Vitals:   07/23/23 1849 07/23/23 1924  BP: 113/73 116/74  Pulse: 86 88  Resp:  18  Temp:  98.6 F (37 C)  SpO2: 94% 95%   Vitals:   07/23/23 1548 07/23/23 1619 07/23/23 1849 07/23/23 1924  BP: 126/76 118/77 113/73 116/74  Pulse: 71 79 86 88  Resp:  16  18  Temp:  98.1 F (36.7 C)  98.6 F (37 C)  TempSrc:  Oral  Oral  SpO2: 95% 95% 94% 95%  Weight:      Height:       Body mass index is 29.68 kg/m.   General: Alert awake, not in obvious distress, HENT: pupils equally reacting to light,  No scleral pallor or icterus noted. Oral mucosa is moist.  Chest:  Clear breath sounds.  No crackles or wheezes.  CVS: S1 &S2 heard. No murmur.  Regular rate and rhythm. Abdomen: Soft, nontender, obese abdomen.  Bowel sounds are heard.   Extremities: No cyanosis, clubbing or edema.  Peripheral pulses are palpable. Psych: Alert, awake and oriented, normal mood CNS:  No cranial nerve deficits.  Power equal in all extremities.   Skin: Warm and dry.  No rashes noted.  The results of significant diagnostics from this hospitalization (including imaging, microbiology, ancillary and laboratory) are listed below for reference.     Diagnostic Studies:   CT Head Wo Contrast  Result Date: 07/20/2023 CLINICAL DATA:  Headache EXAM: CT HEAD WITHOUT CONTRAST TECHNIQUE: Contiguous axial images were obtained from the base of the skull through the vertex without intravenous contrast. RADIATION DOSE REDUCTION: This exam was performed according to the departmental dose-optimization program which includes automated exposure control, adjustment of the mA and/or kV according to patient size and/or use of iterative reconstruction technique. COMPARISON:  08/11/21 FINDINGS: Brain: There is no mass, hemorrhage or extra-axial collection. The size and  configuration of the ventricles and extra-axial CSF spaces are normal. The brain parenchyma is normal, without acute or chronic infarction. Vascular: No abnormal hyperdensity of the major intracranial arteries or dural venous sinuses. No intracranial atherosclerosis. Skull: Unchanged soft tissue lesion at the left anterior scalp. Sinuses/Orbits: No fluid levels or advanced mucosal thickening of the visualized paranasal sinuses. No mastoid or middle ear effusion. The orbits are normal. IMPRESSION: 1. No acute intracranial abnormality. Electronically Signed   By: Deatra Robinson M.D.   On: 07/20/2023 02:14   CT ABDOMEN PELVIS W CONTRAST  Result Date: 07/20/2023 CLINICAL DATA:  PE suspected, high probability, abdominal pain, postop EXAM: CT ANGIOGRAPHY CHEST CT ABDOMEN AND PELVIS WITH CONTRAST TECHNIQUE: Multidetector CT imaging of the chest was performed using the standard protocol during bolus administration of intravenous contrast. Multiplanar CT image reconstructions and MIPs were obtained to evaluate the vascular anatomy. Multidetector CT imaging of the abdomen and pelvis was performed using the standard protocol during bolus administration of intravenous contrast. RADIATION DOSE REDUCTION: This exam was performed according to the departmental dose-optimization program which includes automated exposure control, adjustment of the mA and/or kV according to patient size and/or use of iterative reconstruction technique. CONTRAST:  OMNIPAQUE IOHEXOL 350 MG/ML SOLN COMPARISON:  CTA chest 08/11/2021 and CT  abdomen and pelvis 09/21/2020 FINDINGS: CTA CHEST FINDINGS Cardiovascular: Negative for acute pulmonary embolism. Normal caliber thoracic aorta. No pericardial effusion. Mediastinum/Nodes: Trachea and esophagus are unremarkable. No thoracic adenopathy Lungs/Pleura: Bilateral lower lobe atelectasis. Calcified granuloma left upper lobe. No pleural effusion or pneumothorax. Musculoskeletal: No acute fracture.  Review of the MIP images confirms the above findings. CT ABDOMEN and PELVIS FINDINGS Hepatobiliary: Cholelithiasis. No evidence of cholecystitis. No biliary dilation. Unremarkable liver. Pancreas: Unremarkable. Spleen: Unremarkable. Adrenals/Urinary Tract: Normal adrenal glands. No urinary calculi or hydronephrosis. Unremarkable bladder. Stomach/Bowel: Inflammatory fat stranding centered about the terminal ileum with mild colonic and terminal ileal wall thickening. Reactive hyperemia of the normal caliber appendix. Scattered colonic diverticula. No evidence of obstruction. Stomach is within normal limits. Vascular/Lymphatic: Prominent subcentimeter ileocolic lymph nodes on the right are likely reactive. No acute vascular abnormality. Reproductive: No acute abnormality. Other: Small amount of free fluid in the right pericolic gutter. No free intraperitoneal air. Musculoskeletal: No acute fracture. Review of the MIP images confirms the above findings. IMPRESSION: 1. Negative for acute pulmonary embolism. 2. Constellation of findings favored to represent infectious or inflammatory terminal ileitis/ascending colitis. No abscess or perforation. Electronically Signed   By: Minerva Fester M.D.   On: 07/20/2023 00:24   CT Angio Chest PE W and/or Wo Contrast  Result Date: 07/20/2023 CLINICAL DATA:  PE suspected, high probability, abdominal pain, postop EXAM: CT ANGIOGRAPHY CHEST CT ABDOMEN AND PELVIS WITH CONTRAST TECHNIQUE: Multidetector CT imaging of the chest was performed using the standard protocol during bolus administration of intravenous contrast. Multiplanar CT image reconstructions and MIPs were obtained to evaluate the vascular anatomy. Multidetector CT imaging of the abdomen and pelvis was performed using the standard protocol during bolus administration of intravenous contrast. RADIATION DOSE REDUCTION: This exam was performed according to the departmental dose-optimization program which includes automated  exposure control, adjustment of the mA and/or kV according to patient size and/or use of iterative reconstruction technique. CONTRAST:  OMNIPAQUE IOHEXOL 350 MG/ML SOLN COMPARISON:  CTA chest 08/11/2021 and CT abdomen and pelvis 09/21/2020 FINDINGS: CTA CHEST FINDINGS Cardiovascular: Negative for acute pulmonary embolism. Normal caliber thoracic aorta. No pericardial effusion. Mediastinum/Nodes: Trachea and esophagus are unremarkable. No thoracic adenopathy Lungs/Pleura: Bilateral lower lobe atelectasis. Calcified granuloma left upper lobe. No pleural effusion or pneumothorax. Musculoskeletal: No acute fracture. Review of the MIP images confirms the above findings. CT ABDOMEN and PELVIS FINDINGS Hepatobiliary: Cholelithiasis. No evidence of cholecystitis. No biliary dilation. Unremarkable liver. Pancreas: Unremarkable. Spleen: Unremarkable. Adrenals/Urinary Tract: Normal adrenal glands. No urinary calculi or hydronephrosis. Unremarkable bladder. Stomach/Bowel: Inflammatory fat stranding centered about the terminal ileum with mild colonic and terminal ileal wall thickening. Reactive hyperemia of the normal caliber appendix. Scattered colonic diverticula. No evidence of obstruction. Stomach is within normal limits. Vascular/Lymphatic: Prominent subcentimeter ileocolic lymph nodes on the right are likely reactive. No acute vascular abnormality. Reproductive: No acute abnormality. Other: Small amount of free fluid in the right pericolic gutter. No free intraperitoneal air. Musculoskeletal: No acute fracture. Review of the MIP images confirms the above findings. IMPRESSION: 1. Negative for acute pulmonary embolism. 2. Constellation of findings favored to represent infectious or inflammatory terminal ileitis/ascending colitis. No abscess or perforation. Electronically Signed   By: Minerva Fester M.D.   On: 07/20/2023 00:24   DG Chest Port 1 View  Result Date: 07/19/2023 CLINICAL DATA:  Chest pain EXAM: PORTABLE  CHEST 1 VIEW COMPARISON:  CTA chest 08/11/2021 FINDINGS: Low lung volumes accentuate cardiomediastinal silhouette and pulmonary vascularity. Bibasilar atelectasis.  Nodule in the left mid lung was previously seen on CT 08/11/2021 and is compatible with a benign calcified granuloma. No pleural effusion or pneumothorax. No displaced rib fractures. IMPRESSION: Low lung volumes and basilar atelectasis. Electronically Signed   By: Minerva Fester M.D.   On: 07/19/2023 23:05     Labs:   Basic Metabolic Panel: Recent Labs  Lab 07/19/23 2142 07/20/23 0001 07/22/23 0112 07/22/23 2324  NA 129*  --  132* 133*  K 4.1  --  3.6 3.2*  CL 98  --  105 102  CO2 20*  --  19* 21*  GLUCOSE 133*  --  93 100*  BUN 15  --  14 10  CREATININE 0.78  --  0.84 0.81  CALCIUM 8.5*  --  7.5* 7.9*  MG  --  2.2 2.3 2.0   GFR Estimated Creatinine Clearance: 88 mL/min (by C-G formula based on SCr of 0.81 mg/dL). Liver Function Tests: Recent Labs  Lab 07/19/23 2142  AST 32  ALT 25  ALKPHOS 84  BILITOT 3.3*  PROT 7.5  ALBUMIN 3.8   Recent Labs  Lab 07/19/23 2142  LIPASE 13   No results for input(s): "AMMONIA" in the last 168 hours. Coagulation profile No results for input(s): "INR", "PROTIME" in the last 168 hours.  CBC: Recent Labs  Lab 07/19/23 2142 07/20/23 1757 07/21/23 0131 07/22/23 0112 07/22/23 2324  WBC 18.4* 18.1* 17.5* 13.8* 10.3  HGB 13.9 12.8* 12.6* 11.4* 11.5*  HCT 39.9 37.9* 39.4 33.9* 34.3*  MCV 86.6 88.8 92.3 88.5 88.9  PLT 184 186 210 182 235   Cardiac Enzymes: Recent Labs  Lab 07/20/23 0001  CKTOTAL 151   BNP: Invalid input(s): "POCBNP" CBG: No results for input(s): "GLUCAP" in the last 168 hours. D-Dimer No results for input(s): "DDIMER" in the last 72 hours. Hgb A1c No results for input(s): "HGBA1C" in the last 72 hours. Lipid Profile No results for input(s): "CHOL", "HDL", "LDLCALC", "TRIG", "CHOLHDL", "LDLDIRECT" in the last 72 hours.  Thyroid function  studies No results for input(s): "TSH", "T4TOTAL", "T3FREE", "THYROIDAB" in the last 72 hours.  Invalid input(s): "FREET3" Anemia work up No results for input(s): "VITAMINB12", "FOLATE", "FERRITIN", "TIBC", "IRON", "RETICCTPCT" in the last 72 hours. Microbiology Recent Results (from the past 240 hour(s))  SARS Coronavirus 2 by RT PCR (hospital order, performed in [facility] hospital lab) *cepheid single result test* Anterior Nasal Swab     Status: None   Collection Time: 07/19/23 11:20 PM   Specimen: Anterior Nasal Swab  Result Value Ref Range Status   SARS Coronavirus 2 by RT PCR NEGATIVE NEGATIVE Final    Comment: (NOTE) SARS-CoV-2 target nucleic acids are NOT DETECTED.  The SARS-CoV-2 RNA is generally detectable in upper and lower respiratory specimens during the acute phase of infection. The lowest concentration of SARS-CoV-2 viral copies this assay can detect is 250 copies / mL. A negative result does not preclude SARS-CoV-2 infection and should not be used as the sole basis for treatment or other patient management decisions.  A negative result may occur with improper specimen collection / handling, submission of specimen other than nasopharyngeal swab, presence of viral mutation(s) within the areas targeted by this assay, and inadequate number of viral copies (<250 copies / mL). A negative result must be combined with clinical observations, patient history, and epidemiological information.  Fact Sheet for Patients:   RoadLapTop.co.za  Fact Sheet for Healthcare Providers: http://kim-miller.com/  This test is not yet approved or  cleared by the [place] and has been authorized for detection and/or diagnosis of SARS-CoV-2 by FDA under an Emergency Use Authorization (EUA).  This EUA will remain in effect (meaning this test can be used) for the duration of the COVID-19 declaration under Section 564(b)(1) of the Act, 21  U.S.C. section 360bbb-3(b)(1), unless the authorization is terminated or revoked sooner.  Performed at [facility], [address] [place], [place] [address]   Culture, blood (Routine X 2) w Reflex to ID Panel     Status: None (Preliminary result)   Collection Time: 07/22/23  1:58 PM   Specimen: BLOOD RIGHT ARM  Result Value Ref Range Status   Specimen Description BLOOD RIGHT ARM  Final   Special Requests   Final    BOTTLES DRAWN AEROBIC AND ANAEROBIC Blood Culture adequate volume   Culture   Final    NO GROWTH 2 DAYS Performed at [facility], [address] [place], [place] [address]    Report Status PENDING  Incomplete  Culture, blood (Routine X 2) w Reflex to ID Panel     Status: None (Preliminary result)   Collection Time: 07/22/23  1:58 PM   Specimen: BLOOD LEFT HAND  Result Value Ref Range Status   Specimen Description BLOOD LEFT HAND  Final   Special Requests   Final    BOTTLES DRAWN AEROBIC AND ANAEROBIC Blood Culture adequate volume   Culture   Final    NO GROWTH 2 DAYS Performed at [facility], [address] [place], [place] [address]    Report Status PENDING  Incomplete     Discharge Instructions:   Discharge Instructions     Call MD for:  persistant nausea and vomiting   Complete by: As directed    Call MD for:  severe uncontrolled pain   Complete by: As directed    Call MD for:  temperature >100.4   Complete by: As directed    Diet - low sodium heart healthy   Complete by: As directed    Discharge instructions   Complete by: As directed    Follow-up with your primary care provider in 1 week.  Take medications as prescribed including antibiotics. No alcohol or over the counter pain medications.  Seek medical attention for worsening symptoms.   Increase activity slowly   Complete by: As directed       Allergies as of 07/23/2023   No Known Allergies      Medication List     STOP taking these medications     amLODipine 10 MG tablet Commonly known as: NORVASC   cyclobenzaprine 5 MG tablet Commonly known as: FLEXERIL   Ibuprofen 200 MG Caps   ibuprofen 600 MG tablet Commonly known as: ADVIL   indomethacin 50 MG capsule Commonly known as: INDOCIN   ketorolac 10 MG tablet Commonly known as: TORADOL   meclizine 25 MG tablet Commonly known as: ANTIVERT   NON FORMULARY       TAKE these medications    amoxicillin-clavulanate 875-125 MG tablet Commonly known as: AUGMENTIN Take 1 tablet by mouth 2 (two) times daily for 5 days.   aspirin EC 81 MG tablet Take 1 tablet (81 mg total) by mouth daily. Swallow whole.   Blood Pressure Kit 1 each by Does not apply route 3 (three) times daily.   dicyclomine 20 MG tablet Commonly known as: BENTYL Take 1 tablet (20 mg total) by mouth 4 (four) times daily -  before meals and at bedtime for 5 days.   docusate sodium 100 MG capsule Commonly known as: COLACE Take 1 capsule (100 mg total) by mouth 2 (two) times daily.   escitalopram 10 MG tablet Commonly known as: LEXAPRO Take 1 tablet (10 mg total) by mouth daily.   folic acid 1 MG tablet Commonly known as: FOLVITE [place] 1 tableta (1 mg en total) por va oral diariamente. (Take 1 tablet (1 mg total) by mouth daily.)   hydrochlorothiazide 12.5 MG tablet Commonly known as: HYDRODIURIL Take 1 tablet (12.5 mg total) by mouth every morning.   lisinopril 20 MG tablet Commonly known as: ZESTRIL [place] 1 tableta (20 mg en total) por va oral antes de acostarse. (Take 1 tablet (20 mg total) by mouth at bedtime.)   metoprolol tartrate 25 MG tablet Commonly known as: LOPRESSOR Take 1 tablet (25 mg total) by mouth 2 (two) times daily.   pantoprazole 40 MG tablet Commonly known as: PROTONIX Take 1 tablet (40 mg total) by mouth daily. What changed:  medication strength how much to take   potassium chloride SA 20 MEQ tablet Commonly known as: KLOR-CON M Take 1 tablet (20 mEq total) by  mouth daily for 5 days.   tamsulosin 0.4 MG Caps capsule Commonly known as: FLOMAX Take 1 capsule (0.4 mg total) by mouth daily.   thiamine 100 MG tablet Commonly known as: VITAMIN B1 [place] 1 tableta (100 mg en total) por va oral diariamente. (Take 1 tablet (100 mg total) by mouth daily.)   TYLENOL 500 MG tablet Generic drug: acetaminophen Take 500-1,000 mg by mouth every 6 (six) hours as needed for mild pain or headache.        Follow-up Information     Primary care provider Follow up in 1 week(s).                   Time coordinating discharge: 39 minutes  Signed:  Laretha Luepke  [facility] 07/24/2023, 2:53 PM

## 2023-07-23 NOTE — H&P (View-Only) (Signed)
Subjective:  Denies SSCP, palpitations or Dyspnea Abdomen still with some discomfort but improved and not acute. For cath today  Objective:  Vitals:   07/22/23 2109 07/23/23 0316 07/23/23 0559 07/23/23 0818  BP: 127/86 112/80 123/76 113/72  Pulse: 75   76  Resp:  18  18  Temp:  99 F (37.2 C)  98.8 F (37.1 C)  TempSrc:  Oral  Oral  SpO2:  96%  97%  Weight:   70.4 kg   Height:        Intake/Output from previous day:  Intake/Output Summary (Last 24 hours) at 07/23/2023 0947 Last data filed at 07/23/2023 0800 Gross per 24 hour  Intake 1404.15 ml  Output --  Net 1404.15 ml    Physical Exam:  [place] male in no distress Lungs clear No murmur  Abdomen mildly distended with no rebound Tympanitic No edema Palpable pedal pulses   Lab Results: Basic Metabolic Panel: Recent Labs    07/22/23 0112 07/22/23 2324  NA 132* 133*  K 3.6 3.2*  CL 105 102  CO2 19* 21*  GLUCOSE 93 100*  BUN 14 10  CREATININE 0.84 0.81  CALCIUM 7.5* 7.9*  MG 2.3 2.0   Liver Function Tests: No results for input(s): "AST", "ALT", "ALKPHOS", "BILITOT", "PROT", "ALBUMIN" in the last 72 hours.  No results for input(s): "LIPASE", "AMYLASE" in the last 72 hours.  CBC: Recent Labs    07/22/23 0112 07/22/23 2324  WBC 13.8* 10.3  HGB 11.4* 11.5*  HCT 33.9* 34.3*  MCV 88.5 88.9  PLT 182 235   Cardiac Enzymes: No results for input(s): "CKTOTAL", "CKMB", "CKMBINDEX", "TROPONINI" in the last 72 hours.  BNP: Invalid input(s): "POCBNP" D-Dimer: No results for input(s): "DDIMER" in the last 72 hours.  Hemoglobin A1C: No results for input(s): "HGBA1C" in the last 72 hours. Fasting Lipid Panel: Recent Labs    07/21/23 0131  CHOL 126  HDL 40*  LDLCALC 64  TRIG 161  CHOLHDL 3.2     Imaging: Imaging results have been reviewed  Cardiac Studies:  ECG: SR no acute ST changes   Telemetry:  NSR   Echo:   Medications:    aspirin EC  81 mg Oral Daily   docusate sodium  100 mg  Oral BID   escitalopram  10 mg Oral Daily   metoprolol tartrate  25 mg Oral BID   multivitamin with minerals  1 tablet Oral Daily   pantoprazole (PROTONIX) IV  40 mg Intravenous Q24H   polyethylene glycol  17 g Oral Daily      sodium chloride 1 mL/kg/hr (07/23/23 0601)   heparin 2,100 Units/hr (07/23/23 0004)   piperacillin-tazobactam (ZOSYN)  IV 3.375 g (07/23/23 0603)    Assessment/Plan:   SEMI:  no chest pain or acute ECG changes TTE  EF 55-60% He has low grade fever and likely infectious colitis. Not quite ready for heart cath Continue lopressor ASA and heparin Cath today Used video interpretor to explain procedure to patient see below willing to proceed    Shared Decision Making/Informed Consent The risks [stroke (1 in 1000), death (1 in 1000), kidney failure [usually temporary] (1 in 500), bleeding (1 in 200), allergic reaction [possibly serious] (1 in 200)], benefits (diagnostic support and management of coronary artery disease) and alternatives of a cardiac catheterization were discussed in detail with Ms. Wanita Chamberlain and she is willing to proceed.  Charlton Haws  2. Colitis:  WBC down Abdominal exam improved taking PO on Zosyn  ok to proceed with cath   07/23/2023, 9:47 AM

## 2023-07-23 NOTE — Plan of Care (Signed)
  Problem: Clinical Measurements: Goal: Ability to maintain clinical measurements within normal limits will improve Outcome: Progressing Goal: Cardiovascular complication will be avoided Outcome: Progressing   Problem: Pain Managment: Goal: General experience of comfort will improve Outcome: Progressing   Problem: Safety: Goal: Ability to remain free from injury will improve Outcome: Progressing

## 2023-07-23 NOTE — Progress Notes (Signed)
Pt was discharged with wife. Arm band was removed. No hematoma noted and site was covered with gauze and transparent dressing. Discharge instructions and radial site care education was given with spanish interpreter. Pt understands instructions.

## 2023-07-23 NOTE — Progress Notes (Addendum)
Subjective:  Denies SSCP, palpitations or Dyspnea Abdomen still with some discomfort but improved and not acute. For cath today  Objective:  Vitals:   07/22/23 2109 07/23/23 0316 07/23/23 0559 07/23/23 0818  BP: 127/86 112/80 123/76 113/72  Pulse: 75   76  Resp:  18  18  Temp:  99 F (37.2 C)  98.8 F (37.1 C)  TempSrc:  Oral  Oral  SpO2:  96%  97%  Weight:   70.4 kg   Height:        Intake/Output from previous day:  Intake/Output Summary (Last 24 hours) at 07/23/2023 0947 Last data filed at 07/23/2023 0800 Gross per 24 hour  Intake 1404.15 ml  Output --  Net 1404.15 ml    Physical Exam:  [place] male in no distress Lungs clear No murmur  Abdomen mildly distended with no rebound Tympanitic No edema Palpable pedal pulses   Lab Results: Basic Metabolic Panel: Recent Labs    07/22/23 0112 07/22/23 2324  NA 132* 133*  K 3.6 3.2*  CL 105 102  CO2 19* 21*  GLUCOSE 93 100*  BUN 14 10  CREATININE 0.84 0.81  CALCIUM 7.5* 7.9*  MG 2.3 2.0   Liver Function Tests: No results for input(s): "AST", "ALT", "ALKPHOS", "BILITOT", "PROT", "ALBUMIN" in the last 72 hours.  No results for input(s): "LIPASE", "AMYLASE" in the last 72 hours.  CBC: Recent Labs    07/22/23 0112 07/22/23 2324  WBC 13.8* 10.3  HGB 11.4* 11.5*  HCT 33.9* 34.3*  MCV 88.5 88.9  PLT 182 235   Cardiac Enzymes: No results for input(s): "CKTOTAL", "CKMB", "CKMBINDEX", "TROPONINI" in the last 72 hours.  BNP: Invalid input(s): "POCBNP" D-Dimer: No results for input(s): "DDIMER" in the last 72 hours.  Hemoglobin A1C: No results for input(s): "HGBA1C" in the last 72 hours. Fasting Lipid Panel: Recent Labs    07/21/23 0131  CHOL 126  HDL 40*  LDLCALC 64  TRIG 161  CHOLHDL 3.2     Imaging: Imaging results have been reviewed  Cardiac Studies:  ECG: SR no acute ST changes   Telemetry:  NSR   Echo:   Medications:    aspirin EC  81 mg Oral Daily   docusate sodium  100 mg  Oral BID   escitalopram  10 mg Oral Daily   metoprolol tartrate  25 mg Oral BID   multivitamin with minerals  1 tablet Oral Daily   pantoprazole (PROTONIX) IV  40 mg Intravenous Q24H   polyethylene glycol  17 g Oral Daily      sodium chloride 1 mL/kg/hr (07/23/23 0601)   heparin 2,100 Units/hr (07/23/23 0004)   piperacillin-tazobactam (ZOSYN)  IV 3.375 g (07/23/23 0603)    Assessment/Plan:   SEMI:  no chest pain or acute ECG changes TTE  EF 55-60% He has low grade fever and likely infectious colitis. Not quite ready for heart cath Continue lopressor ASA and heparin Cath today Used video interpretor to explain procedure to patient see below willing to proceed    Shared Decision Making/Informed Consent The risks [stroke (1 in 1000), death (1 in 1000), kidney failure [usually temporary] (1 in 500), bleeding (1 in 200), allergic reaction [possibly serious] (1 in 200)], benefits (diagnostic support and management of coronary artery disease) and alternatives of a cardiac catheterization were discussed in detail with Ms. Wanita Chamberlain and she is willing to proceed.  Charlton Haws  2. Colitis:  WBC down Abdominal exam improved taking PO on Zosyn  ok to proceed with cath   07/23/2023, 9:47 AM

## 2023-07-23 NOTE — Interval H&P Note (Signed)
History and Physical Interval Note:  07/23/2023 12:36 PM  [facility] Steinhaus  has presented today for surgery, with the diagnosis of nstemi.  The various methods of treatment have been discussed with the patient and family. After consideration of risks, benefits and other options for treatment, the patient has consented to  Procedure(s): LEFT HEART CATH AND CORONARY ANGIOGRAPHY (N/A) as a surgical intervention.  The patient's history has been reviewed, patient examined, no change in status, stable for surgery.  I have reviewed the patient's chart and labs.  Questions were answered to the patient's satisfaction.    Cath Lab Visit (complete for each Cath Lab visit)  Clinical Evaluation Leading to the Procedure:   ACS: Yes.    Non-ACS:    Anginal Classification: CCS II  Anti-ischemic medical therapy: No Therapy  Non-Invasive Test Results: No non-invasive testing performed  Prior CABG: No previous CABG        Carl Briggs

## 2023-07-23 NOTE — Progress Notes (Signed)
ANTICOAGULATION CONSULT NOTE Pharmacy Consult for Heparin Indication: chest pain/ACS  No Known Allergies  Patient Measurements: Height: 5' 0.63" (154 cm) Weight: 70.4 kg (155 lb 3.3 oz) IBW/kg (Calculated) : 51.45 Heparin Dosing Weight: 70 kg  Vital Signs: Temp: 99 F (37.2 C) (07/31 0316) Temp Source: Oral (07/31 0316) BP: 123/76 (07/31 0559) Pulse Rate: 75 (07/30 2109)  Labs: Recent Labs    07/20/23 1910 07/20/23 2102 07/21/23 0131 07/21/23 0947 07/22/23 0112 07/22/23 1004 07/22/23 2324 07/23/23 0723  HGB  --   --  12.6*  --  11.4*  --  11.5*  --   HCT  --   --  39.4  --  33.9*  --  34.3*  --   PLT  --   --  210  --  182  --  235  --   HEPARINUNFRC  --   --  <0.10*   < > 0.35 0.32  --  0.49  CREATININE  --   --   --   --  0.84  --  0.81  --   TROPONINIHS 428* 439*  --   --   --   --   --   --    < > = values in this interval not displayed.    Estimated Creatinine Clearance: 88 mL/min (by C-G formula based on SCr of 0.81 mg/dL).  Assessment: 52 yo M presenting with chest pain with elevated troponin and D-dimer, found to have NSTEMI. Cardiology plan to proceed with LHC once active infection/inflammation improves. Pharmacy consulted to dose heparin.   Heparin level 0.49 is therapeutic on heparin 2000 units/hr. Hgb (11.5) and PLTs (235) are stable. No issues with the line or signs of bleeding per RN.  Goal of Therapy:  Heparin level 0.3-0.7 units/ml Monitor platelets by anticoagulation protocol: Yes   Plan:  Continue heparin at 2100 units/hr Monitor daily HL, CBC, and signs/sx of bleeding F/u plan for LHC today  Romie Minus, PharmD PGY1 Pharmacy Resident  Please check AMION for all [facility] phone numbers After 10:00 PM, call Main Pharmacy [phone]

## 2023-07-24 ENCOUNTER — Encounter (HOSPITAL_COMMUNITY): Payer: Self-pay | Admitting: Cardiovascular Disease

## 2023-07-25 ENCOUNTER — Other Ambulatory Visit: Payer: Self-pay

## 2023-07-25 ENCOUNTER — Inpatient Hospital Stay (HOSPITAL_COMMUNITY): Payer: Self-pay | Admitting: Certified Registered Nurse Anesthetist

## 2023-07-25 ENCOUNTER — Inpatient Hospital Stay (HOSPITAL_COMMUNITY)
Admission: EM | Admit: 2023-07-25 | Discharge: 2023-08-08 | DRG: 853 | Disposition: A | Discharge status: Home Health | Payer: Self-pay | Attending: Surgery | Admitting: Surgery

## 2023-07-25 ENCOUNTER — Inpatient Hospital Stay (HOSPITAL_COMMUNITY): Payer: Self-pay

## 2023-07-25 ENCOUNTER — Emergency Department (HOSPITAL_COMMUNITY): Payer: Self-pay

## 2023-07-25 ENCOUNTER — Encounter (HOSPITAL_COMMUNITY): Payer: Self-pay | Admitting: Family Medicine

## 2023-07-25 ENCOUNTER — Encounter (HOSPITAL_COMMUNITY): Admission: EM | Disposition: A | Discharge status: Home Health | Payer: Self-pay | Source: Home / Self Care

## 2023-07-25 DIAGNOSIS — R0902 Hypoxemia: Secondary | ICD-10-CM | POA: Diagnosis not present

## 2023-07-25 DIAGNOSIS — R188 Other ascites: Secondary | ICD-10-CM | POA: Diagnosis not present

## 2023-07-25 DIAGNOSIS — K529 Noninfective gastroenteritis and colitis, unspecified: Secondary | ICD-10-CM

## 2023-07-25 DIAGNOSIS — Z7982 Long term (current) use of aspirin: Secondary | ICD-10-CM

## 2023-07-25 DIAGNOSIS — D62 Acute posthemorrhagic anemia: Secondary | ICD-10-CM | POA: Diagnosis not present

## 2023-07-25 DIAGNOSIS — I2510 Atherosclerotic heart disease of native coronary artery without angina pectoris: Secondary | ICD-10-CM

## 2023-07-25 DIAGNOSIS — Z833 Family history of diabetes mellitus: Secondary | ICD-10-CM

## 2023-07-25 DIAGNOSIS — A419 Sepsis, unspecified organism: Principal | ICD-10-CM | POA: Diagnosis present

## 2023-07-25 DIAGNOSIS — R7989 Other specified abnormal findings of blood chemistry: Secondary | ICD-10-CM | POA: Diagnosis present

## 2023-07-25 DIAGNOSIS — E871 Hypo-osmolality and hyponatremia: Secondary | ICD-10-CM | POA: Diagnosis not present

## 2023-07-25 DIAGNOSIS — E861 Hypovolemia: Secondary | ICD-10-CM | POA: Diagnosis not present

## 2023-07-25 DIAGNOSIS — F419 Anxiety disorder, unspecified: Secondary | ICD-10-CM

## 2023-07-25 DIAGNOSIS — K219 Gastro-esophageal reflux disease without esophagitis: Secondary | ICD-10-CM | POA: Diagnosis present

## 2023-07-25 DIAGNOSIS — Y92239 Unspecified place in hospital as the place of occurrence of the external cause: Secondary | ICD-10-CM | POA: Diagnosis not present

## 2023-07-25 DIAGNOSIS — Z79899 Other long term (current) drug therapy: Secondary | ICD-10-CM

## 2023-07-25 DIAGNOSIS — I252 Old myocardial infarction: Secondary | ICD-10-CM

## 2023-07-25 DIAGNOSIS — E7800 Pure hypercholesterolemia, unspecified: Secondary | ICD-10-CM | POA: Diagnosis present

## 2023-07-25 DIAGNOSIS — K631 Perforation of intestine (nontraumatic): Principal | ICD-10-CM | POA: Diagnosis present

## 2023-07-25 DIAGNOSIS — I2489 Other forms of acute ischemic heart disease: Secondary | ICD-10-CM | POA: Diagnosis present

## 2023-07-25 DIAGNOSIS — D72829 Elevated white blood cell count, unspecified: Secondary | ICD-10-CM | POA: Diagnosis present

## 2023-07-25 DIAGNOSIS — Z597 Insufficient social insurance and welfare support: Secondary | ICD-10-CM

## 2023-07-25 DIAGNOSIS — Z603 Acculturation difficulty: Secondary | ICD-10-CM | POA: Diagnosis present

## 2023-07-25 DIAGNOSIS — K668 Other specified disorders of peritoneum: Secondary | ICD-10-CM | POA: Diagnosis present

## 2023-07-25 DIAGNOSIS — K50014 Crohn's disease of small intestine with abscess: Secondary | ICD-10-CM | POA: Diagnosis present

## 2023-07-25 DIAGNOSIS — I96 Gangrene, not elsewhere classified: Secondary | ICD-10-CM | POA: Diagnosis not present

## 2023-07-25 DIAGNOSIS — E8721 Acute metabolic acidosis: Secondary | ICD-10-CM | POA: Diagnosis present

## 2023-07-25 DIAGNOSIS — I10 Essential (primary) hypertension: Secondary | ICD-10-CM | POA: Diagnosis present

## 2023-07-25 DIAGNOSIS — D75839 Thrombocytosis, unspecified: Secondary | ICD-10-CM | POA: Diagnosis not present

## 2023-07-25 DIAGNOSIS — K6819 Other retroperitoneal abscess: Secondary | ICD-10-CM | POA: Diagnosis present

## 2023-07-25 DIAGNOSIS — T8131XA Disruption of external operation (surgical) wound, not elsewhere classified, initial encounter: Secondary | ICD-10-CM | POA: Diagnosis not present

## 2023-07-25 HISTORY — PX: COLECTOMY: SHX59

## 2023-07-25 LAB — CBC
HCT: 41.6 % (ref 39.0–52.0)
HCT: 36.5 % — ABNORMAL LOW (ref 39.0–52.0)
Hemoglobin: 14 g/dL (ref 13.0–17.0)
Hemoglobin: 11.7 g/dL — ABNORMAL LOW (ref 13.0–17.0)
MCH: 30.2 pg (ref 26.0–34.0)
MCH: 29.2 pg (ref 26.0–34.0)
MCHC: 33.7 g/dL (ref 30.0–36.0)
MCHC: 32.1 g/dL (ref 30.0–36.0)
MCV: 89.7 fL (ref 80.0–100.0)
MCV: 91 fL (ref 80.0–100.0)
Platelets: 339 10*3/uL (ref 150–400)
Platelets: 309 10*3/uL (ref 150–400)
RBC: 4.64 MIL/uL (ref 4.22–5.81)
RBC: 4.01 MIL/uL — ABNORMAL LOW (ref 4.22–5.81)
RDW: 14.1 % (ref 11.5–15.5)
RDW: 14.1 % (ref 11.5–15.5)
WBC: 10.9 10*3/uL — ABNORMAL HIGH (ref 4.0–10.5)
WBC: 13.3 10*3/uL — ABNORMAL HIGH (ref 4.0–10.5)
nRBC: 0 % (ref 0.0–0.2)
nRBC: 0 % (ref 0.0–0.2)

## 2023-07-25 LAB — URINALYSIS, ROUTINE W REFLEX MICROSCOPIC
Bilirubin Urine: NEGATIVE
Glucose, UA: NEGATIVE mg/dL
Ketones, ur: NEGATIVE mg/dL
Leukocytes,Ua: NEGATIVE
Nitrite: NEGATIVE
Protein, ur: NEGATIVE mg/dL
Specific Gravity, Urine: 1.026 (ref 1.005–1.030)
pH: 5 (ref 5.0–8.0)

## 2023-07-25 LAB — COMPREHENSIVE METABOLIC PANEL
ALT: 38 U/L (ref 0–44)
AST: 27 U/L (ref 15–41)
Albumin: 2.6 g/dL — ABNORMAL LOW (ref 3.5–5.0)
Alkaline Phosphatase: 80 U/L (ref 38–126)
Anion gap: 11 (ref 5–15)
BUN: 10 mg/dL (ref 6–20)
CO2: 21 mmol/L — ABNORMAL LOW (ref 22–32)
Calcium: 8.5 mg/dL — ABNORMAL LOW (ref 8.9–10.3)
Chloride: 100 mmol/L (ref 98–111)
Creatinine, Ser: 0.74 mg/dL (ref 0.61–1.24)
GFR, Estimated: >60 mL/min (ref >60)
Glucose, Bld: 139 mg/dL — ABNORMAL HIGH (ref 70–99)
Potassium: 4 mmol/L (ref 3.5–5.1)
Sodium: 132 mmol/L — ABNORMAL LOW (ref 135–145)
Total Bilirubin: 0.5 mg/dL (ref 0.3–1.2)
Total Protein: 7.5 g/dL (ref 6.5–8.1)

## 2023-07-25 LAB — I-STAT CG4 LACTIC ACID, ED: Lactic Acid, Venous: 0.5 mmol/L (ref 0.5–1.9)

## 2023-07-25 LAB — CREATININE, SERUM
Creatinine, Ser: 0.68 mg/dL (ref 0.61–1.24)
GFR, Estimated: >60 mL/min (ref >60)

## 2023-07-25 LAB — AEROBIC/ANAEROBIC CULTURE W GRAM STAIN (SURGICAL/DEEP WOUND)

## 2023-07-25 LAB — LIPASE, BLOOD: Lipase: 34 U/L (ref 11–51)

## 2023-07-25 LAB — TYPE AND SCREEN
ABO/RH(D): O POS
Antibody Screen: NEGATIVE

## 2023-07-25 SURGERY — COLECTOMY, TOTAL
Anesthesia: General

## 2023-07-25 MED ORDER — HYDROMORPHONE HCL 1 MG/ML IJ SOLN
0.2500 mg | INTRAMUSCULAR | Status: DC | PRN
  Administered 2023-07-25 (×2): 0.5 mg via INTRAVENOUS

## 2023-07-25 MED ORDER — PROPOFOL 10 MG/ML IV BOLUS
INTRAVENOUS | Status: DC | PRN
  Administered 2023-07-25: 150 mg via INTRAVENOUS

## 2023-07-25 MED ORDER — SODIUM CHLORIDE 0.9 % IV SOLN: INTRAVENOUS | Status: DC

## 2023-07-25 MED ORDER — HYDROMORPHONE HCL 1 MG/ML IJ SOLN
INTRAMUSCULAR | Status: DC | PRN
Start: 2023-07-25 — End: 2023-07-25
  Administered 2023-07-25 (×3): .5 mg via INTRAVENOUS

## 2023-07-25 MED ORDER — MIDAZOLAM HCL 2 MG/2ML IJ SOLN
INTRAMUSCULAR | Status: AC
  Filled 2023-07-25: qty 2

## 2023-07-25 MED ORDER — SODIUM CHLORIDE 0.9 % IV BOLUS
1000.0000 mL | Freq: Once | INTRAVENOUS | Status: AC
  Administered 2023-07-25: 1000 mL via INTRAVENOUS

## 2023-07-25 MED ORDER — ONDANSETRON HCL 4 MG/2ML IJ SOLN
INTRAMUSCULAR | Status: AC
  Filled 2023-07-25: qty 2

## 2023-07-25 MED ORDER — ROCURONIUM BROMIDE 10 MG/ML (PF) SYRINGE
PREFILLED_SYRINGE | INTRAVENOUS | Status: DC | PRN
  Administered 2023-07-25: 20 mg via INTRAVENOUS
  Administered 2023-07-25: 50 mg via INTRAVENOUS

## 2023-07-25 MED ORDER — KCL IN DEXTROSE-NACL 20-5-0.45 MEQ/L-%-% IV SOLN
INTRAVENOUS | Status: DC
  Filled 2023-07-25 (×11): qty 1000

## 2023-07-25 MED ORDER — LACTATED RINGERS IV SOLN: INTRAVENOUS | Status: DC

## 2023-07-25 MED ORDER — ENOXAPARIN SODIUM 40 MG/0.4ML IJ SOSY
40.0000 mg | PREFILLED_SYRINGE | INTRAMUSCULAR | Status: DC
  Administered 2023-07-26 – 2023-08-07 (×13): 40 mg via SUBCUTANEOUS
  Filled 2023-07-25 (×14): qty 0.4

## 2023-07-25 MED ORDER — FENTANYL CITRATE (PF) 250 MCG/5ML IJ SOLN
INTRAMUSCULAR | Status: AC
  Filled 2023-07-25: qty 5

## 2023-07-25 MED ORDER — ROCURONIUM BROMIDE 10 MG/ML (PF) SYRINGE
PREFILLED_SYRINGE | INTRAVENOUS | Status: AC
  Filled 2023-07-25: qty 10

## 2023-07-25 MED ORDER — FENTANYL CITRATE (PF) 250 MCG/5ML IJ SOLN
INTRAMUSCULAR | Status: DC | PRN
  Administered 2023-07-25: 150 ug via INTRAVENOUS
  Administered 2023-07-25 (×2): 50 ug via INTRAVENOUS

## 2023-07-25 MED ORDER — CHLORHEXIDINE GLUCONATE 0.12 % MT SOLN
OROMUCOSAL | Status: AC
  Administered 2023-07-25: 15 mL via OROMUCOSAL
  Filled 2023-07-25: qty 15

## 2023-07-25 MED ORDER — SUGAMMADEX SODIUM 200 MG/2ML IV SOLN
INTRAVENOUS | Status: DC | PRN
  Administered 2023-07-25: 200 mg via INTRAVENOUS

## 2023-07-25 MED ORDER — PHENYLEPHRINE 80 MCG/ML (10ML) SYRINGE FOR IV PUSH (FOR BLOOD PRESSURE SUPPORT)
PREFILLED_SYRINGE | INTRAVENOUS | Status: DC | PRN
  Administered 2023-07-25 (×3): 80 ug via INTRAVENOUS

## 2023-07-25 MED ORDER — OXYCODONE HCL 5 MG PO TABS: 5.0000 mg | ORAL_TABLET | Freq: Once | ORAL | Status: DC | PRN

## 2023-07-25 MED ORDER — PIPERACILLIN-TAZOBACTAM 3.375 G IVPB: 3.3750 g | Freq: Three times a day (TID) | INTRAVENOUS | Status: DC

## 2023-07-25 MED ORDER — ALBUMIN HUMAN 5 % IV SOLN: INTRAVENOUS | Status: DC | PRN

## 2023-07-25 MED ORDER — ACETAMINOPHEN 10 MG/ML IV SOLN: 1000.0000 mg | Freq: Four times a day (QID) | INTRAVENOUS | Status: DC

## 2023-07-25 MED ORDER — PHENYLEPHRINE HCL-NACL 20-0.9 MG/250ML-% IV SOLN
INTRAVENOUS | Status: DC | PRN
  Administered 2023-07-25: 25 ug/min via INTRAVENOUS

## 2023-07-25 MED ORDER — LACTATED RINGERS IV BOLUS
1000.0000 mL | Freq: Once | INTRAVENOUS | Status: AC
  Administered 2023-07-25: 1000 mL via INTRAVENOUS

## 2023-07-25 MED ORDER — POTASSIUM CHLORIDE 2 MEQ/ML IV SOLN: INTRAVENOUS | Status: DC

## 2023-07-25 MED ORDER — HYDROMORPHONE HCL 1 MG/ML IJ SOLN
INTRAMUSCULAR | Status: AC
  Filled 2023-07-25: qty 0.5

## 2023-07-25 MED ORDER — MIDAZOLAM HCL 2 MG/2ML IJ SOLN
INTRAMUSCULAR | Status: DC | PRN
  Administered 2023-07-25: 2 mg via INTRAVENOUS

## 2023-07-25 MED ORDER — HYDROMORPHONE HCL 1 MG/ML IJ SOLN
INTRAMUSCULAR | Status: AC
  Filled 2023-07-25: qty 1

## 2023-07-25 MED ORDER — ONDANSETRON HCL 4 MG PO TABS: 4.0000 mg | ORAL_TABLET | Freq: Four times a day (QID) | ORAL | Status: DC | PRN

## 2023-07-25 MED ORDER — ACETAMINOPHEN 500 MG PO TABS: 1000.0000 mg | ORAL_TABLET | Freq: Once | ORAL | Status: DC | PRN

## 2023-07-25 MED ORDER — ACETAMINOPHEN 10 MG/ML IV SOLN
1000.0000 mg | Freq: Four times a day (QID) | INTRAVENOUS | Status: DC
  Filled 2023-07-25 (×2): qty 100

## 2023-07-25 MED ORDER — ACETAMINOPHEN 10 MG/ML IV SOLN
INTRAVENOUS | Status: AC
  Administered 2023-07-25: 1000 mg via INTRAVENOUS
  Filled 2023-07-25: qty 100

## 2023-07-25 MED ORDER — ACETAMINOPHEN 160 MG/5ML PO SOLN: 1000.0000 mg | Freq: Once | ORAL | Status: DC | PRN

## 2023-07-25 MED ORDER — SUCCINYLCHOLINE CHLORIDE 200 MG/10ML IV SOSY
PREFILLED_SYRINGE | INTRAVENOUS | Status: DC | PRN
  Administered 2023-07-25: 80 mg via INTRAVENOUS

## 2023-07-25 MED ORDER — HYDROMORPHONE HCL 1 MG/ML IJ SOLN
INTRAMUSCULAR | Status: AC
  Administered 2023-07-25: 0.5 mg
  Filled 2023-07-25: qty 1

## 2023-07-25 MED ORDER — SUCCINYLCHOLINE CHLORIDE 200 MG/10ML IV SOSY
PREFILLED_SYRINGE | INTRAVENOUS | Status: AC
  Filled 2023-07-25: qty 10

## 2023-07-25 MED ORDER — IOHEXOL 350 MG/ML SOLN
75.0000 mL | Freq: Once | INTRAVENOUS | Status: AC | PRN
  Administered 2023-07-25: 75 mL via INTRAVENOUS

## 2023-07-25 MED ORDER — DEXAMETHASONE SODIUM PHOSPHATE 10 MG/ML IJ SOLN
INTRAMUSCULAR | Status: DC | PRN
  Administered 2023-07-25: 10 mg via INTRAVENOUS

## 2023-07-25 MED ORDER — ONDANSETRON HCL 4 MG/2ML IJ SOLN
INTRAMUSCULAR | Status: DC | PRN
Start: 2023-07-25 — End: 2023-07-25
  Administered 2023-07-25: 4 mg via INTRAVENOUS

## 2023-07-25 MED ORDER — OXYCODONE HCL 5 MG/5ML PO SOLN: 5.0000 mg | Freq: Once | ORAL | Status: DC | PRN

## 2023-07-25 MED ORDER — ONDANSETRON HCL 4 MG/2ML IJ SOLN: 4.0000 mg | INTRAMUSCULAR | Status: DC | PRN

## 2023-07-25 MED ORDER — ACETAMINOPHEN 10 MG/ML IV SOLN: 1000.0000 mg | Freq: Once | INTRAVENOUS | Status: DC | PRN

## 2023-07-25 MED ORDER — PIPERACILLIN-TAZOBACTAM 3.375 G IVPB 30 MIN
3.3750 g | Freq: Once | INTRAVENOUS | Status: AC
  Administered 2023-07-25: 3.375 g via INTRAVENOUS
  Filled 2023-07-25: qty 50

## 2023-07-25 MED ORDER — HYDROMORPHONE HCL 1 MG/ML IJ SOLN
INTRAMUSCULAR | Status: AC
  Administered 2023-07-25: 0.5 mg via INTRAVENOUS
  Filled 2023-07-25: qty 1

## 2023-07-25 MED ORDER — MORPHINE SULFATE (PF) 2 MG/ML IV SOLN
1.0000 mg | INTRAVENOUS | Status: DC | PRN
  Administered 2023-07-25 – 2023-07-26 (×5): 4 mg via INTRAVENOUS
  Administered 2023-07-27: 2 mg via INTRAVENOUS
  Filled 2023-07-25: qty 2
  Filled 2023-07-25: qty 1
  Filled 2023-07-25 (×4): qty 2

## 2023-07-25 MED ORDER — HYDROMORPHONE HCL 1 MG/ML IJ SOLN: 0.5000 mg | INTRAMUSCULAR | Status: DC | PRN

## 2023-07-25 MED ORDER — LIDOCAINE 2% (20 MG/ML) 5 ML SYRINGE
INTRAMUSCULAR | Status: AC
  Filled 2023-07-25: qty 5

## 2023-07-25 MED ORDER — PHENYLEPHRINE 80 MCG/ML (10ML) SYRINGE FOR IV PUSH (FOR BLOOD PRESSURE SUPPORT)
PREFILLED_SYRINGE | INTRAVENOUS | Status: AC
  Filled 2023-07-25: qty 10

## 2023-07-25 MED ORDER — ONDANSETRON HCL 4 MG/2ML IJ SOLN
4.0000 mg | Freq: Four times a day (QID) | INTRAMUSCULAR | Status: DC | PRN
  Administered 2023-08-02 – 2023-08-03 (×2): 4 mg via INTRAVENOUS
  Filled 2023-07-25 (×2): qty 2

## 2023-07-25 MED ORDER — MORPHINE SULFATE (PF) 4 MG/ML IV SOLN
4.0000 mg | Freq: Once | INTRAVENOUS | Status: AC
  Administered 2023-07-25: 4 mg via INTRAVENOUS
  Filled 2023-07-25: qty 1

## 2023-07-25 MED ORDER — ORAL CARE MOUTH RINSE: 15.0000 mL | Freq: Once | OROMUCOSAL | Status: AC

## 2023-07-25 MED ORDER — CHLORHEXIDINE GLUCONATE 0.12 % MT SOLN: 15.0000 mL | Freq: Once | OROMUCOSAL | Status: AC

## 2023-07-25 MED ORDER — ONDANSETRON HCL 4 MG/2ML IJ SOLN
4.0000 mg | Freq: Once | INTRAMUSCULAR | Status: AC
  Administered 2023-07-25: 4 mg via INTRAVENOUS
  Filled 2023-07-25: qty 2

## 2023-07-25 MED ORDER — LIDOCAINE 2% (20 MG/ML) 5 ML SYRINGE
INTRAMUSCULAR | Status: DC | PRN
  Administered 2023-07-25: 60 mg via INTRAVENOUS

## 2023-07-25 MED ORDER — METHOCARBAMOL 1000 MG/10ML IJ SOLN
500.0000 mg | Freq: Three times a day (TID) | INTRAVENOUS | Status: DC
  Administered 2023-07-25 – 2023-07-27 (×5): 500 mg via INTRAVENOUS
  Filled 2023-07-25 (×5): qty 500

## 2023-07-25 SURGICAL SUPPLY — 46 items
BAG COUNTER SPONGE SURGICOUNT (BAG) ×1 IMPLANT
BAG SPNG CNTER NS LX DISP (BAG) ×1
BLADE CLIPPER SURG (BLADE) IMPLANT
BNDG GAUZE DERMACEA FLUFF 4 (GAUZE/BANDAGES/DRESSINGS) IMPLANT
BNDG GZE DERMACEA 4 6PLY (GAUZE/BANDAGES/DRESSINGS) ×1
CANISTER SUCT 3000ML PPV (MISCELLANEOUS) ×1 IMPLANT
COVER SURGICAL LIGHT HANDLE (MISCELLANEOUS) ×1 IMPLANT
DRAIN CHANNEL 19F RND (DRAIN) IMPLANT
DRAIN RELI 100 BL SUC LF ST (DRAIN) ×1
DRSG OPSITE POSTOP 4X10 (GAUZE/BANDAGES/DRESSINGS) IMPLANT
DRSG OPSITE POSTOP 4X8 (GAUZE/BANDAGES/DRESSINGS) IMPLANT
ELECT BLADE 6.5 EXT (BLADE) ×1 IMPLANT
ELECT CAUTERY BLADE 6.4 (BLADE) ×1 IMPLANT
ELECT REM PT RETURN 9FT ADLT (ELECTROSURGICAL) ×1
ELECTRODE REM PT RTRN 9FT ADLT (ELECTROSURGICAL) ×1 IMPLANT
EVACUATOR SILICONE 100CC (DRAIN) IMPLANT
GAUZE SPONGE 4X4 12PLY STRL (GAUZE/BANDAGES/DRESSINGS) IMPLANT
GLOVE BIO SURGEON STRL SZ8 (GLOVE) ×2 IMPLANT
GLOVE BIOGEL PI IND STRL 8 (GLOVE) ×2 IMPLANT
GOWN STRL REUS W/ TWL LRG LVL3 (GOWN DISPOSABLE) ×4 IMPLANT
GOWN STRL REUS W/ TWL XL LVL3 (GOWN DISPOSABLE) ×2 IMPLANT
GOWN STRL REUS W/TWL LRG LVL3 (GOWN DISPOSABLE) ×5
GOWN STRL REUS W/TWL XL LVL3 (GOWN DISPOSABLE) ×2
KIT OSTOMY DRAINABLE 2.75 STR (WOUND CARE) IMPLANT
KIT TURNOVER KIT B (KITS) ×1 IMPLANT
LIGASURE IMPACT 36 18CM CVD LR (INSTRUMENTS) ×1 IMPLANT
NS IRRIG 1000ML POUR BTL (IV SOLUTION) ×2 IMPLANT
PACK COLON (CUSTOM PROCEDURE TRAY) IMPLANT
PAD ARMBOARD 7.5X6 YLW CONV (MISCELLANEOUS) ×2 IMPLANT
PENCIL BUTTON HOLSTER BLD 10FT (ELECTRODE) IMPLANT
RELOAD PROXIMATE 75MM BLUE (ENDOMECHANICALS) ×1 IMPLANT
RELOAD STAPLE 75 3.8 BLU REG (ENDOMECHANICALS) IMPLANT
SPONGE T-LAP 18X18 ~~LOC~~+RFID (SPONGE) IMPLANT
STAPLER PROXIMATE 75MM BLUE (STAPLE) IMPLANT
STAPLER VISISTAT 35W (STAPLE) ×1 IMPLANT
SURGILUBE 2OZ TUBE FLIPTOP (MISCELLANEOUS) IMPLANT
SUT ETHILON 2 0 FS 18 (SUTURE) IMPLANT
SUT PDS AB 1 TP1 96 (SUTURE) ×2 IMPLANT
SUT SILK 2 0 SH CR/8 (SUTURE) ×1 IMPLANT
SUT SILK 2 0 TIES 10X30 (SUTURE) ×1 IMPLANT
SUT SILK 3 0 SH CR/8 (SUTURE) ×1 IMPLANT
SUT SILK 3 0 TIES 10X30 (SUTURE) ×1 IMPLANT
SUT VIC AB 3-0 SH 18 (SUTURE) IMPLANT
TOWEL GREEN STERILE (TOWEL DISPOSABLE) ×2 IMPLANT
TRAY FOLEY MTR SLVR 14FR STAT (SET/KITS/TRAYS/PACK) ×1 IMPLANT
TUBE CONNECTING 12X1/4 (SUCTIONS) ×1 IMPLANT

## 2023-07-25 NOTE — ED Notes (Signed)
Consent signed and placed on the back of the bed.

## 2023-07-25 NOTE — H&P (Signed)
TRH H&P    Patient Demographics:    Carl Briggs, is a 52 y.o. male  MRN: [id number]  DOB - 22-Oct-1971  Admit Date - 07/25/2023  Referring MD/NP/PA: Melton Alar  Outpatient Primary MD for the patient is Patient, No Pcp Per  Patient coming from: Home  Chief complaint-abdominal pain   HPI:    Carl Briggs  is a 52 y.o. male, with history of hypertension, hyperlipidemia, anxiety disorder who was just discharged from the hospital on 07/24/2023 after he was treated for sepsis due to colitis.  Patient was admitted with inflammatory terminal ileitis/ascending colitis, started on Zosyn and then discharged on p.o. Augmentin.  Patient states that he was taking antibiotics as prescribed however his pain did not get better, he also had nausea and felt like vomiting.  He did eat soft food yesterday. Patient says due to worsening pain he came back to ED. In the ED CT scan of the abdomen/pelvis showed contained perforation in the ascending colon, no widespread free air. General surgery was consulted, and patient will be taken to the OR today for colectomy and possible ileostomy. He denies chest pain or shortness of breath Denies fever or sweating, felt cold Denies dizziness or passing out Had 1 episode of loose stool today  Patient is Spanish-speaking, history obtained with wall video interpreter   Review of systems:    In addition to the HPI above,    All other systems reviewed and are negative.    Past History of the following :    Past Medical History:  Diagnosis Date   Anxiety    High cholesterol    Hypertension       Past Surgical History:  Procedure Laterality Date   COLONOSCOPY WITH PROPOFOL N/A 11/09/2018   Procedure: COLONOSCOPY WITH PROPOFOL;  Surgeon: Willis Modena, MD;  [facility] END[facility]COPY;  Service: Endoscopy;  Laterality: N/A;   LEFT HEART CATH AND CORONARY ANGIOGRAPHY N/A  07/23/2023   Procedure: LEFT HEART CATH AND CORONARY ANGIOGRAPHY;  Surgeon: Kathleene Hazel, MD;  Location: [facility] INVASIVE CV LAB;  Service: Cardiovascular;  Laterality: N/A;      Social History:      Social History   Tobacco Use   Smoking status: Never   Smokeless tobacco: Never  Substance Use Topics   Alcohol use: Not Currently       Family History :     Family History  Problem Relation Age of Onset   Diabetes Mother       Home Medications:   Prior to Admission medications   Medication Sig Start Date End Date Taking? Authorizing Provider  amoxicillin-clavulanate (AUGMENTIN) 875-125 MG tablet Take 1 tablet by mouth 2 (two) times daily for 5 days. 07/23/23 07/28/23  Pokhrel, Rebekah Chesterfield, MD  aspirin EC 81 MG tablet Take 1 tablet (81 mg total) by mouth daily. Swallow whole. 07/24/23   Pokhrel, Rebekah Chesterfield, MD  Blood Pressure KIT 1 each by Does not apply route 3 (three) times daily. 08/13/21   Burnadette Pop, MD  dicyclomine (BENTYL) 20 MG tablet Take  1 tablet (20 mg total) by mouth 4 (four) times daily -  before meals and at bedtime for 5 days. 07/23/23 07/28/23  Pokhrel, Rebekah Chesterfield, MD  docusate sodium (COLACE) 100 MG capsule Take 1 capsule (100 mg total) by mouth 2 (two) times daily. 07/23/23   Pokhrel, Rebekah Chesterfield, MD  escitalopram (LEXAPRO) 10 MG tablet Take 1 tablet (10 mg total) by mouth daily. 07/23/23 10/21/23  Pokhrel, Rebekah Chesterfield, MD  folic acid (FOLVITE) 1 MG tablet Take 1 tablet (1 mg total) by mouth daily. 07/23/23 10/21/23  Pokhrel, Rebekah Chesterfield, MD  hydrochlorothiazide (HYDRODIURIL) 12.5 MG tablet Take 1 tablet (12.5 mg total) by mouth every morning. 07/23/23 07/22/24  Pokhrel, Rebekah Chesterfield, MD  lisinopril (ZESTRIL) 20 MG tablet Take 1 tablet (20 mg total) by mouth at bedtime. 07/23/23 07/22/24  Pokhrel, Rebekah Chesterfield, MD  metoprolol tartrate (LOPRESSOR) 25 MG tablet Take 1 tablet (25 mg total) by mouth 2 (two) times daily. 07/23/23 07/22/24  Pokhrel, Rebekah Chesterfield, MD  pantoprazole (PROTONIX) 40 MG tablet Take 1 tablet  (40 mg total) by mouth daily. 07/23/23 10/21/23  Pokhrel, Rebekah Chesterfield, MD  potassium chloride SA (KLOR-CON M) 20 MEQ tablet Take 1 tablet (20 mEq total) by mouth daily for 5 days. 07/23/23 07/28/23  Pokhrel, Rebekah Chesterfield, MD  tamsulosin (FLOMAX) 0.4 MG CAPS capsule Take 1 capsule (0.4 mg total) by mouth daily. 07/23/23 10/21/23  Pokhrel, Rebekah Chesterfield, MD  thiamine (VITAMIN B1) 100 MG tablet Take 1 tablet (100 mg total) by mouth daily. 07/23/23 10/21/23  Pokhrel, Rebekah Chesterfield, MD  TYLENOL 500 MG tablet Take 500-1,000 mg by mouth every 6 (six) hours as needed for mild pain or headache.    [provider]     Allergies:    No Known Allergies   Physical Exam:   Vitals  Blood pressure 115/78, pulse 78, temperature 98.3 F (36.8 C), temperature source Oral, resp. rate 16, SpO2 94%.  1.  General: Appears in no acute distress  2. Psychiatric: Alert, oriented x 3, intact insight and judgment  3. Neurologic: Cranial nerves II through XII grossly intact, no focal deficit noted  4. HEENMT:  Atraumatic normocephalic, extraocular muscles are intact  5. Respiratory : Lungs clear to auscultation bilaterally  6. Cardiovascular : S1-S2, regular  7. Gastrointestinal:  Abdomen soft, positive tenderness in  right lower quadrant      Data Review:    CBC Recent Labs  Lab 07/20/23 1757 07/21/23 0131 07/22/23 0112 07/22/23 2324 07/25/23 0512  WBC 18.1* 17.5* 13.8* 10.3 10.9*  HGB 12.8* 12.6* 11.4* 11.5* 14.0  HCT 37.9* 39.4 33.9* 34.3* 41.6  PLT 186 210 182 235 339  MCV 88.8 92.3 88.5 88.9 89.7  MCH 30.0 29.5 29.8 29.8 30.2  MCHC 33.8 32.0 33.6 33.5 33.7  RDW 14.3 14.4 14.2 14.2 14.1   ------------------------------------------------------------------------------------------------------------------  Results for orders placed or performed during the hospital encounter of 07/25/23 (from the past 48 hour(s))  Urinalysis, Routine w reflex microscopic -Urine, Clean Catch     Status: Abnormal    Collection Time: 07/25/23  5:02 AM  Result Value Ref Range   Color, Urine YELLOW YELLOW   APPearance CLEAR CLEAR   Specific Gravity, Urine 1.026 1.005 - 1.030   pH 5.0 5.0 - 8.0   Glucose, UA NEGATIVE NEGATIVE mg/dL   Hgb urine dipstick SMALL (A) NEGATIVE   Bilirubin Urine NEGATIVE NEGATIVE   Ketones, ur NEGATIVE NEGATIVE mg/dL   Protein, ur NEGATIVE NEGATIVE mg/dL   Nitrite NEGATIVE NEGATIVE   Leukocytes,Ua NEGATIVE NEGATIVE   RBC /  HPF 0-5 0 - 5 RBC/hpf   WBC, UA 0-5 0 - 5 WBC/hpf   Bacteria, UA RARE (A) NONE SEEN   Squamous Epithelial / HPF 0-5 0 - 5 /HPF   Mucus PRESENT     Comment: Performed at [facility], [address], [place], [place] [address]  Lipase, blood     Status: None   Collection Time: 07/25/23  5:12 AM  Result Value Ref Range   Lipase 34 11 - 51 U/L    Comment: Performed at [facility], [address], [place], [place] [address]  Comprehensive metabolic panel     Status: Abnormal   Collection Time: 07/25/23  5:12 AM  Result Value Ref Range   Sodium 132 (L) 135 - 145 mmol/L   Potassium 4.0 3.5 - 5.1 mmol/L   Chloride 100 98 - 111 mmol/L   CO2 21 (L) 22 - 32 mmol/L   Glucose, Bld 139 (H) 70 - 99 mg/dL    Comment: Glucose reference range applies only to samples taken after fasting for at least 8 hours.   BUN 10 6 - 20 mg/dL   Creatinine, Ser 0.98 0.61 - 1.24 mg/dL   Calcium 8.5 (L) 8.9 - 10.3 mg/dL   Total Protein 7.5 6.5 - 8.1 g/dL   Albumin 2.6 (L) 3.5 - 5.0 g/dL   AST 27 15 - 41 U/L   ALT 38 0 - 44 U/L   Alkaline Phosphatase 80 38 - 126 U/L   Total Bilirubin 0.5 0.3 - 1.2 mg/dL   GFR, Estimated >11 >91 mL/min    Comment: (NOTE) Calculated using the CKD-EPI Creatinine Equation (2021)    Anion gap 11 5 - 15    Comment: Performed at [facility], [address], [place], [place] [address]  CBC     Status: Abnormal   Collection Time: 07/25/23  5:12 AM  Result Value Ref Range   WBC 10.9 (H) 4.0 - 10.5 K/uL   RBC 4.64 4.22 - 5.81  MIL/uL   Hemoglobin 14.0 13.0 - 17.0 g/dL   HCT 56.2 13.0 - 86.5 %   MCV 89.7 80.0 - 100.0 fL   MCH 30.2 26.0 - 34.0 pg   MCHC 33.7 30.0 - 36.0 g/dL   RDW 78.4 69.6 - 29.5 %   Platelets 339 150 - 400 K/uL   nRBC 0.0 0.0 - 0.2 %    Comment: Performed at [facility], [address] [place], [place] [address]  I-Stat CG4 Lactic Acid     Status: None   Collection Time: 07/25/23 12:08 PM  Result Value Ref Range   Lactic Acid, Venous 0.5 0.5 - 1.9 mmol/L    Chemistries  Recent Labs  Lab 07/19/23 2142 07/20/23 0001 07/22/23 0112 07/22/23 2324 07/25/23 0512  NA 129*  --  132* 133* 132*  K 4.1  --  3.6 3.2* 4.0  CL 98  --  105 102 100  CO2 20*  --  19* 21* 21*  GLUCOSE 133*  --  93 100* 139*  BUN 15  --  14 10 10   CREATININE 0.78  --  0.84 0.81 0.74  CALCIUM 8.5*  --  7.5* 7.9* 8.5*  MG  --  2.2 2.3 2.0  --   AST 32  --   --   --  27  ALT 25  --   --   --  38  ALKPHOS 84  --   --   --  80  BILITOT 3.3*  --   --   --  0.5   ------------------------------------------------------------------------------------------------------------------  ------------------------------------------------------------------------------------------------------------------ GFR: Estimated Creatinine Clearance: 89.1 mL/min (by C-G formula based on SCr of 0.74 mg/dL). Liver Function Tests: Recent Labs  Lab 07/19/23 2142 07/25/23 0512  AST 32 27  ALT 25 38  ALKPHOS 84 80  BILITOT 3.3* 0.5  PROT 7.5 7.5  ALBUMIN 3.8 2.6*   Recent Labs  Lab 07/19/23 2142 07/25/23 0512  LIPASE 13 34   No results for input(s): "AMMONIA" in the last 168 hours. Coagulation Profile: No results for input(s): "INR", "PROTIME" in the last 168 hours. Cardiac Enzymes: Recent Labs  Lab 07/20/23 0001  CKTOTAL 151   BNP (last 3 results) No results for input(s): "PROBNP" in the last 8760 hours. HbA1C: No results for input(s): "HGBA1C" in the last 72 hours. CBG: No results for input(s): "GLUCAP" in the  last 168 hours. Lipid Profile: No results for input(s): "CHOL", "HDL", "LDLCALC", "TRIG", "CHOLHDL", "LDLDIRECT" in the last 72 hours. Thyroid Function Tests: No results for input(s): "TSH", "T4TOTAL", "FREET4", "T3FREE", "THYROIDAB" in the last 72 hours. Anemia Panel: No results for input(s): "VITAMINB12", "FOLATE", "FERRITIN", "TIBC", "IRON", "RETICCTPCT" in the last 72 hours.  --------------------------------------------------------------------------------------------------------------- Urine analysis:    Component Value Date/Time   COLORURINE YELLOW 07/25/2023 0502   APPEARANCEUR CLEAR 07/25/2023 0502   LABSPEC 1.026 07/25/2023 0502   PHURINE 5.0 07/25/2023 0502   GLUCOSEU NEGATIVE 07/25/2023 0502   HGBUR SMALL (A) 07/25/2023 0502   BILIRUBINUR NEGATIVE 07/25/2023 0502   KETONESUR NEGATIVE 07/25/2023 0502   PROTEINUR NEGATIVE 07/25/2023 0502   NITRITE NEGATIVE 07/25/2023 0502   LEUKOCYTESUR NEGATIVE 07/25/2023 0502      Imaging Results:    CT ABDOMEN PELVIS W CONTRAST  Result Date: 07/25/2023 CLINICAL DATA:  Right lower quadrant pain. Recent diagnosis of colitis. EXAM: CT ABDOMEN AND PELVIS WITH CONTRAST TECHNIQUE: Multidetector CT imaging of the abdomen and pelvis was performed using the standard protocol following bolus administration of intravenous contrast. RADIATION DOSE REDUCTION: This exam was performed according to the departmental dose-optimization program which includes automated exposure control, adjustment of the mA and/or kV according to patient size and/or use of iterative reconstruction technique. CONTRAST:  75mL OMNIPAQUE IOHEXOL 350 MG/ML SOLN COMPARISON:  CT 07/19/2023 FINDINGS: Lower chest: 3-4 mm left lower lobe lung nodule stable going back to at least November 2019. This demonstrates long-term stability. No additional imaging follow-up. Bandlike opacities dependently along both lung bases. Atelectasis is favored. No pleural effusion. Hepatobiliary: Fatty liver  infiltration. Patent portal vein. Stones in the nondilated gallbladder. Tiny peripheral cystic focus seen in segment 4 of the liver, too small to completely characterize but likely benign cysts and no specific imaging follow-up. Pancreas: Unremarkable. No pancreatic ductal dilatation or surrounding inflammatory changes. Spleen: Normal in size without focal abnormality. Adrenals/Urinary Tract: The adrenal glands are preserved. No enhancing renal mass or collecting system dilatation. Stable Bosniak 1 upper pole right-sided renal cystic focus measuring 2.6 cm. Hounsfield unit of 2. No imaging follow-up. The ureters have normal course and caliber extending down to the bladder. Preserved contours of the urinary bladder. Stomach/Bowel: No oral contrast. Few left-sided colonic diverticula. Previously there were some wall thickening and stranding in the area of the ascending colon and cecum. This has markedly increased and there is no extraluminal air and ill-defined fluid surrounding the bowel in this location extending into the mesentery consistent with a perforation. The small bowel and stomach are nondilated. Vascular/Lymphatic: Normal caliber aorta and  IVC. Minimal atherosclerotic calcifications. No developing abnormal lymph node enlargement identified. Reproductive: Prostate is unremarkable. Other: Small amount of ascites identified in the abdomen including along the margin of the liver. Musculoskeletal: Mild degenerative changes of the spine and pelvis. IMPRESSION: Progressive wall thickening along the proximal right side of the colon with now significant surrounding extraluminal fluid, air and stranding worrisome for focal perforation. No associated obstruction. No widespread free air. Gallstones. Critical Value/emergent results were called by telephone at the time of interpretation on 07/25/2023 at 7:48 am to provider [facility] , who verbally acknowledged these results. Electronically Signed   By: Karen Kays  M.D.   On: 07/25/2023 10:50   CARDIAC CATHETERIZATION  Result Date: 07/24/2023 No angiographic evidence of CAD LVEDP=17 mmHg Recommendations: No further ischemic workup.       Assessment & Plan:    Principal Problem:   Bowel perforation (HCC)   Colitis/contained perforation-presented with abdominal pain, CT abdomen/pelvis showed progressive wall thickening along the proximal right side of the colon with significant surrounding extraluminal fluid, air and stranding, worrisome for focal perforation.  General surgery consulted, plan to take to  OR for colectomy with possible ileostomy.  Continue IV Zosyn Hypertension-lisinopril on hold, restart after surgery Anxiety-Lexapro on hold Remote history of alcohol abuse-no signs of alcohol withdrawal.  He was discharged on thiamine, will resume thiamine after surgery, when he is able to take by mouth.    DVT Prophylaxis-   Lovenox   AM Labs Ordered, also please review Full Orders  Family Communication: Admission, patients condition and plan of care including tests being ordered have been discussed with the patient  who indicate understanding and agree with the plan and Code Status.  Code Status: Full code  Admission status: /Inpatient :The appropriate admission status for this patient is INPATIENT. Inpatient status is judged to be reasonable and necessary in order to provide the required intensity of service to ensure the patient's safety. The patient's presenting symptoms, physical exam findings, and initial radiographic and laboratory data in the context of their chronic comorbidities is felt to place them at high risk for further clinical deterioration. Furthermore, it is not anticipated that the patient will be medically stable for discharge from the hospital within 2 midnights of admission. The following factors support the admission status of inpatient.       * I certify that at the point of admission it is my clinical judgment that the  patient will require inpatient hospital care spanning beyond 2 midnights from the point of admission due to high intensity of service, high risk for further deterioration and high frequency of surveillance required.*  Time spent in minutes : 60 min   [place] S Berniece Abid M.D

## 2023-07-25 NOTE — Anesthesia Procedure Notes (Signed)
Procedure Name: Intubation Date/Time: 07/25/2023 1:40 PM  Performed by: Nils Pyle, CRNAPre-anesthesia Checklist: Patient identified, Emergency Drugs available, Suction available and Patient being monitored Patient Re-evaluated:Patient Re-evaluated prior to induction Oxygen Delivery Method: Circle System Utilized Preoxygenation: Pre-oxygenation with 100% oxygen Induction Type: IV induction, Rapid sequence and Cricoid Pressure applied Laryngoscope Size: Miller and 2 Grade View: Grade I Tube type: Oral Tube size: 7.5 mm Number of attempts: 1 Airway Equipment and Method: Stylet and Oral airway Placement Confirmation: ETT inserted through vocal cords under direct vision, positive ETCO2 and breath sounds checked- equal and bilateral Secured at: 23 cm Tube secured with: Tape Dental Injury: Teeth and Oropharynx as per pre-operative assessment

## 2023-07-25 NOTE — Progress Notes (Signed)
Video interpreter [place] [phone]) utilized to update patient on room status and plan of care.  All questions answered.  Patient resting comfortably.

## 2023-07-25 NOTE — Anesthesia Preprocedure Evaluation (Addendum)
Anesthesia Evaluation  Patient identified by MRN, date of birth, ID band Patient awake    Reviewed: Allergy & Precautions, NPO status , Patient's Chart, lab work & pertinent test results  Airway Mallampati: III  TM Distance: >3 FB Neck ROM: Full    Dental  (+) Chipped   Pulmonary neg pulmonary ROS   Pulmonary exam normal        Cardiovascular hypertension, Pt. on medications and Pt. on home beta blockers + CAD and + Past MI  Normal cardiovascular exam     Neuro/Psych   Anxiety     negative neurological ROS     GI/Hepatic ,GERD  Medicated and Controlled,,(+)     substance abuse    Endo/Other  negative endocrine ROS    Renal/GU negative Renal ROS     Musculoskeletal negative musculoskeletal ROS (+)    Abdominal  (+) + obese  Peds  Hematology negative hematology ROS (+)   Anesthesia Other Findings COLITIS  Reproductive/Obstetrics                             Anesthesia Physical Anesthesia Plan  ASA: 2 and emergent  Anesthesia Plan: General   Post-op Pain Management:    Induction: Intravenous  PONV Risk Score and Plan: 2 and Ondansetron, Dexamethasone, Midazolam and Treatment may vary due to age or medical condition  Airway Management Planned: Oral ETT  Additional Equipment:   Intra-op Plan:   Post-operative Plan: Extubation in OR  Informed Consent: I have reviewed the patients History and Physical, chart, labs and discussed the procedure including the risks, benefits and alternatives for the proposed anesthesia with the patient or authorized representative who has indicated his/her understanding and acceptance.     Dental advisory given and Interpreter used for interveiw  Plan Discussed with: CRNA  Anesthesia Plan Comments:        Anesthesia Quick Evaluation

## 2023-07-25 NOTE — Consult Note (Signed)
Carmichael Dalia Heading 1971-08-25  [id number].    Requesting MD: Dr. Alvino Blood Chief Complaint/Reason for Consult: Colitis with focal perforation  HPI: Carl Briggs is a 52 y.o. male with a hx of HTN and HLD who presented to the ED for fever, abdominal pain, n/v/d.   Patient was d/c'd from the hosptital yesterday after admission for abdominal and chest pain w/ concern for demand ischemia vs NSTEMI. R/L heart cath 7/31 w/ no angiographic evidence of CAD. Per [facility] note - elevated Tn were felt to be secondary to infectious vs inflammatory terminal ileitis/ascending colitis. He was tx w/ Zosyn during admission and transitioned to Augmentin at d/c.   He has been taking abx as prescribed but yesterday began having worsening abdominal pain.  He developed nausea secondary to the intensity of his pain.  He has been having diarrhea, but no BRB, melena, or maroon stools. In the ED he was afebrile without tachycardia or hypotension. WBC 10.9. CT A/P w/ progressive wall thickening along the proximal right side of the colon with now significant surrounding extraluminal fluid, air and stranding worrisome for focal perforation. No widespread free air.  We have been asked to see him for further recommendations.  He has been restarted on his Zosyn.  Last Colonoscopy: 2019 Prior Abdominal Surgeries: None Blood Thinners: ASA  ROS: ROS As above, see hpi  Family History  Problem Relation Age of Onset   Diabetes Mother     Past Medical History:  Diagnosis Date   Anxiety    High cholesterol    Hypertension     Past Surgical History:  Procedure Laterality Date   COLONOSCOPY WITH PROPOFOL N/A 11/09/2018   Procedure: COLONOSCOPY WITH PROPOFOL;  Surgeon: Willis Modena, MD;  Location: [facility];  Service: Endoscopy;  Laterality: N/A;   LEFT HEART CATH AND CORONARY ANGIOGRAPHY N/A 07/23/2023   Procedure: LEFT HEART CATH AND CORONARY ANGIOGRAPHY;  Surgeon: Kathleene Hazel, MD;   Location: [facility] INVASIVE CV LAB;  Service: Cardiovascular;  Laterality: N/A;    Social History:  reports that he has never smoked. He has never used smokeless tobacco. He reports that he does not currently use alcohol. No history on file for drug use.  Allergies: No Known Allergies  (Not in a hospital admission)    Physical Exam: Blood pressure 115/78, pulse 78, temperature 98.3 F (36.8 C), temperature source Oral, resp. rate 16, SpO2 94%. General: pleasant, WD, WN Hispanic male who is laying in bed in NAD HEENT: head is normocephalic, atraumatic.  Sclera are noninjected.  PERRL.  Ears and nose without any masses or lesions.  Mouth is pink and moist Heart: regular, rate, and rhythm.  Normal s1,s2. No obvious murmurs, gallops, or rubs noted.  Palpable radial and pedal pulses bilaterally Lungs: CTAB, no wheezes, rhonchi, or rales noted.  Respiratory effort nonlabored Abd: soft, focally tender in RMQ with voluntary guarding to palpation.  The rest of his exam is relatively benign. ND, +BS, no masses, hernias, or organomegaly MS: all 4 extremities are symmetrical with no cyanosis, clubbing, or edema. Psych: A&Ox3 with an appropriate affect.   Results for orders placed or performed during the hospital encounter of 07/25/23 (from the past 48 hour(s))  Urinalysis, Routine w reflex microscopic -Urine, Clean Catch     Status: Abnormal   Collection Time: 07/25/23  5:02 AM  Result Value Ref Range   Color, Urine YELLOW YELLOW   APPearance CLEAR CLEAR   Specific Gravity, Urine 1.026 1.005 -  1.030   pH 5.0 5.0 - 8.0   Glucose, UA NEGATIVE NEGATIVE mg/dL   Hgb urine dipstick SMALL (A) NEGATIVE   Bilirubin Urine NEGATIVE NEGATIVE   Ketones, ur NEGATIVE NEGATIVE mg/dL   Protein, ur NEGATIVE NEGATIVE mg/dL   Nitrite NEGATIVE NEGATIVE   Leukocytes,Ua NEGATIVE NEGATIVE   RBC / HPF 0-5 0 - 5 RBC/hpf   WBC, UA 0-5 0 - 5 WBC/hpf   Bacteria, UA RARE (A) NONE SEEN   Squamous Epithelial / HPF 0-5 0 - 5  /HPF   Mucus PRESENT     Comment: Performed at [facility], [address], [place], [place] [address]  Lipase, blood     Status: None   Collection Time: 07/25/23  5:12 AM  Result Value Ref Range   Lipase 34 11 - 51 U/L    Comment: Performed at [facility], [address], [place], [place] [address]  Comprehensive metabolic panel     Status: Abnormal   Collection Time: 07/25/23  5:12 AM  Result Value Ref Range   Sodium 132 (L) 135 - 145 mmol/L   Potassium 4.0 3.5 - 5.1 mmol/L   Chloride 100 98 - 111 mmol/L   CO2 21 (L) 22 - 32 mmol/L   Glucose, Bld 139 (H) 70 - 99 mg/dL    Comment: Glucose reference range applies only to samples taken after fasting for at least 8 hours.   BUN 10 6 - 20 mg/dL   Creatinine, Ser 8.29 0.61 - 1.24 mg/dL   Calcium 8.5 (L) 8.9 - 10.3 mg/dL   Total Protein 7.5 6.5 - 8.1 g/dL   Albumin 2.6 (L) 3.5 - 5.0 g/dL   AST 27 15 - 41 U/L   ALT 38 0 - 44 U/L   Alkaline Phosphatase 80 38 - 126 U/L   Total Bilirubin 0.5 0.3 - 1.2 mg/dL   GFR, Estimated >56 >21 mL/min    Comment: (NOTE) Calculated using the CKD-EPI Creatinine Equation (2021)    Anion gap 11 5 - 15    Comment: Performed at [facility], [address] [place], [place] [address]  CBC     Status: Abnormal   Collection Time: 07/25/23  5:12 AM  Result Value Ref Range   WBC 10.9 (H) 4.0 - 10.5 K/uL   RBC 4.64 4.22 - 5.81 MIL/uL   Hemoglobin 14.0 13.0 - 17.0 g/dL   HCT 78.4 69.6 - 29.5 %   MCV 89.7 80.0 - 100.0 fL   MCH 30.2 26.0 - 34.0 pg   MCHC 33.7 30.0 - 36.0 g/dL   RDW 28.4 13.2 - 44.0 %   Platelets 339 150 - 400 K/uL   nRBC 0.0 0.0 - 0.2 %    Comment: Performed at [facility], [address] [place], [place] [address]   CT ABDOMEN PELVIS W CONTRAST  Result Date: 07/25/2023 CLINICAL DATA:  Right lower quadrant pain. Recent diagnosis of colitis. EXAM: CT ABDOMEN AND PELVIS WITH CONTRAST TECHNIQUE: Multidetector CT imaging of the abdomen and pelvis was performed  using the standard protocol following bolus administration of intravenous contrast. RADIATION DOSE REDUCTION: This exam was performed according to the departmental dose-optimization program which includes automated exposure control, adjustment of the mA and/or kV according to patient size and/or use of iterative reconstruction technique. CONTRAST:  75mL OMNIPAQUE IOHEXOL 350 MG/ML SOLN COMPARISON:  CT 07/19/2023 FINDINGS: Lower chest: 3-4 mm left lower lobe lung nodule stable going back to  at least November 2019. This demonstrates long-term stability. No additional imaging follow-up. Bandlike opacities dependently along both lung bases. Atelectasis is favored. No pleural effusion. Hepatobiliary: Fatty liver infiltration. Patent portal vein. Stones in the nondilated gallbladder. Tiny peripheral cystic focus seen in segment 4 of the liver, too small to completely characterize but likely benign cysts and no specific imaging follow-up. Pancreas: Unremarkable. No pancreatic ductal dilatation or surrounding inflammatory changes. Spleen: Normal in size without focal abnormality. Adrenals/Urinary Tract: The adrenal glands are preserved. No enhancing renal mass or collecting system dilatation. Stable Bosniak 1 upper pole right-sided renal cystic focus measuring 2.6 cm. Hounsfield unit of 2. No imaging follow-up. The ureters have normal course and caliber extending down to the bladder. Preserved contours of the urinary bladder. Stomach/Bowel: No oral contrast. Few left-sided colonic diverticula. Previously there were some wall thickening and stranding in the area of the ascending colon and cecum. This has markedly increased and there is no extraluminal air and ill-defined fluid surrounding the bowel in this location extending into the mesentery consistent with a perforation. The small bowel and stomach are nondilated. Vascular/Lymphatic: Normal caliber aorta and IVC. Minimal atherosclerotic calcifications. No developing  abnormal lymph node enlargement identified. Reproductive: Prostate is unremarkable. Other: Small amount of ascites identified in the abdomen including along the margin of the liver. Musculoskeletal: Mild degenerative changes of the spine and pelvis. IMPRESSION: Progressive wall thickening along the proximal right side of the colon with now significant surrounding extraluminal fluid, air and stranding worrisome for focal perforation. No associated obstruction. No widespread free air. Gallstones. Critical Value/emergent results were called by telephone at the time of interpretation on 07/25/2023 at 7:48 am to provider [facility] , who verbally acknowledged these results. Electronically Signed   By: Karen Kays M.D.   On: 07/25/2023 10:50   CARDIAC CATHETERIZATION  Result Date: 07/24/2023 No angiographic evidence of CAD LVEDP=17 mmHg Recommendations: No further ischemic workup.    Anti-infectives (From admission, onward)    Start     Dose/Rate Route Frequency Ordered Stop   07/25/23 1100  piperacillin-tazobactam (ZOSYN) IVPB 3.375 g        3.375 g 100 mL/hr over 30 Minutes Intravenous  Once 07/25/23 1051 07/25/23 1130       Assessment/Plan Ascending colitis with focal perforation Patient has been seen and examined. Vitals, labs, imaging, and available notes reviewed. This is a 52 y.o. male with Ascending colitis with perforation. Recommend Exploratory Laparotomy, bowel resection possible creation of an ostomy vs anastomosis pending findings secondary to focal significant tenderness on exam and a worsening CAT scan. The planned procedure was discussed with the patient. The patient's questions were answered to their satisfaction, he voiced understanding and elected to proceed with surgery. All of this was discussed with the patient by Dr. Janee Morn who speaks Spanish.  FEN - NPO/IVFs VTE - SCDs ID - Zosyn  Uncontrolled HTN - per medicine Recent admit for elevated troponin, felt likely demand  ischemia - left heart cath negative for CAD   I reviewed Consultant cardiology notes, hospitalist notes, last 24 h vitals and pain scores, last 48 h intake and output, last 24 h labs and trends, and last 24 h imaging results.  Letha Cape, [facility] Surgery 07/25/2023, 11:35 AM Please see Amion for pager number during day hours 7:00am-4:30pm

## 2023-07-25 NOTE — Consult Note (Signed)
WOC received consult for new ileostomy placed by Dr. Janee Morn 07/25/2023.  Will follow for first postop check Monday 07/28/2023.   Thank you,    Priscella Mann MSN, RN-BC, [employer] [phone]

## 2023-07-25 NOTE — ED Provider Notes (Signed)
[place] EMERGENCY DEPARTMENT AT [facility] Provider Note   CSN: [id number] Arrival date & time: 07/25/23  0455     History  Chief Complaint  Patient presents with   Abdominal Pain    Carl Briggs is a 52 y.o. male with past medical history significant for hypertension, hyperlipidemia, anxiety disorder presents to the ED complaining of abdominal pain with nausea, vomiting, and diarrhea.  Patient was recently discharged from the hospital with colitis suggestive of sepsis.  He was on IV antibiotics and discharged home on Augmentin.  Patient had elevated troponin, but normal cardiac cath.  He states he has been taking his antibiotic as prescribed and has been taking Tylenol, but he is still having significant pain.  He states his vomiting and diarrhea have not improved.  He states he vomits when the pain becomes severe.  Denies melena, hematochezia, hematemesis, fever, chills, abdominal distension.  Denies drug or alcohol use.         Home Medications Prior to Admission medications   Medication Sig Start Date End Date Taking? Authorizing Provider  amoxicillin-clavulanate (AUGMENTIN) 875-125 MG tablet Take 1 tablet by mouth 2 (two) times daily for 5 days. 07/23/23 07/28/23  Pokhrel, Rebekah Chesterfield, MD  aspirin EC 81 MG tablet Take 1 tablet (81 mg total) by mouth daily. Swallow whole. 07/24/23   Pokhrel, Rebekah Chesterfield, MD  Blood Pressure KIT 1 each by Does not apply route 3 (three) times daily. 08/13/21   Burnadette Pop, MD  dicyclomine (BENTYL) 20 MG tablet Take 1 tablet (20 mg total) by mouth 4 (four) times daily -  before meals and at bedtime for 5 days. 07/23/23 07/28/23  Pokhrel, Rebekah Chesterfield, MD  docusate sodium (COLACE) 100 MG capsule Take 1 capsule (100 mg total) by mouth 2 (two) times daily. 07/23/23   Pokhrel, Rebekah Chesterfield, MD  escitalopram (LEXAPRO) 10 MG tablet Take 1 tablet (10 mg total) by mouth daily. 07/23/23 10/21/23  Pokhrel, Rebekah Chesterfield, MD  folic acid (FOLVITE) 1 MG tablet Take 1 tablet (1 mg  total) by mouth daily. 07/23/23 10/21/23  Pokhrel, Rebekah Chesterfield, MD  hydrochlorothiazide (HYDRODIURIL) 12.5 MG tablet Take 1 tablet (12.5 mg total) by mouth every morning. 07/23/23 07/22/24  Pokhrel, Rebekah Chesterfield, MD  lisinopril (ZESTRIL) 20 MG tablet Take 1 tablet (20 mg total) by mouth at bedtime. 07/23/23 07/22/24  Pokhrel, Rebekah Chesterfield, MD  metoprolol tartrate (LOPRESSOR) 25 MG tablet Take 1 tablet (25 mg total) by mouth 2 (two) times daily. 07/23/23 07/22/24  Pokhrel, Rebekah Chesterfield, MD  pantoprazole (PROTONIX) 40 MG tablet Take 1 tablet (40 mg total) by mouth daily. 07/23/23 10/21/23  Pokhrel, Rebekah Chesterfield, MD  potassium chloride SA (KLOR-CON M) 20 MEQ tablet Take 1 tablet (20 mEq total) by mouth daily for 5 days. 07/23/23 07/28/23  Pokhrel, Rebekah Chesterfield, MD  tamsulosin (FLOMAX) 0.4 MG CAPS capsule Take 1 capsule (0.4 mg total) by mouth daily. 07/23/23 10/21/23  Pokhrel, Rebekah Chesterfield, MD  thiamine (VITAMIN B1) 100 MG tablet Take 1 tablet (100 mg total) by mouth daily. 07/23/23 10/21/23  Pokhrel, Rebekah Chesterfield, MD  TYLENOL 500 MG tablet Take 500-1,000 mg by mouth every 6 (six) hours as needed for mild pain or headache.    [provider]      Allergies    Patient has no known allergies.    Review of Systems   Review of Systems  Constitutional:  Negative for chills and fever.  Gastrointestinal:  Positive for abdominal pain, diarrhea, nausea and vomiting. Negative for abdominal distention and blood in stool.  Physical Exam Updated Vital Signs BP 115/78 (BP Location: Left Arm)   Pulse 78   Temp 98.3 F (36.8 C) (Oral)   Resp 16   SpO2 94%  Physical Exam Vitals and nursing note reviewed.  Constitutional:      General: He is not in acute distress.    Appearance: Normal appearance. He is not ill-appearing or diaphoretic.  HENT:     Mouth/Throat:     Comments: Mucus membranes are dry.  Cardiovascular:     Rate and Rhythm: Normal rate and regular rhythm.  Pulmonary:     Effort: Pulmonary effort is normal.  Abdominal:     General:  Abdomen is flat. Bowel sounds are normal.     Palpations: Abdomen is soft.     Tenderness: There is abdominal tenderness in the right lower quadrant. There is no guarding or rebound. Negative signs include McBurney's sign.     Hernia: No hernia is present.     Comments: Exquisite tenderness to RLQ on exam.    Skin:    General: Skin is warm and dry.     Capillary Refill: Capillary refill takes less than 2 seconds.  Neurological:     Mental Status: He is alert. Mental status is at baseline.  Psychiatric:        Mood and Affect: Mood normal.        Behavior: Behavior normal.     ED Results / Procedures / Treatments   Labs (all labs ordered are listed, but only abnormal results are displayed) Labs Reviewed  COMPREHENSIVE METABOLIC PANEL - Abnormal; Notable for the following components:      Result Value   Sodium 132 (*)    CO2 21 (*)    Glucose, Bld 139 (*)    Calcium 8.5 (*)    Albumin 2.6 (*)    All other components within normal limits  CBC - Abnormal; Notable for the following components:   WBC 10.9 (*)    All other components within normal limits  URINALYSIS, ROUTINE W REFLEX MICROSCOPIC - Abnormal; Notable for the following components:   Hgb urine dipstick SMALL (*)    Bacteria, UA RARE (*)    All other components within normal limits  LIPASE, BLOOD  I-STAT CG4 LACTIC ACID, ED  I-STAT CG4 LACTIC ACID, ED  TYPE AND SCREEN    EKG None  Radiology CT ABDOMEN PELVIS W CONTRAST  Result Date: 07/25/2023 CLINICAL DATA:  Right lower quadrant pain. Recent diagnosis of colitis. EXAM: CT ABDOMEN AND PELVIS WITH CONTRAST TECHNIQUE: Multidetector CT imaging of the abdomen and pelvis was performed using the standard protocol following bolus administration of intravenous contrast. RADIATION DOSE REDUCTION: This exam was performed according to the departmental dose-optimization program which includes automated exposure control, adjustment of the mA and/or kV according to patient size  and/or use of iterative reconstruction technique. CONTRAST:  75mL OMNIPAQUE IOHEXOL 350 MG/ML SOLN COMPARISON:  CT 07/19/2023 FINDINGS: Lower chest: 3-4 mm left lower lobe lung nodule stable going back to at least November 2019. This demonstrates long-term stability. No additional imaging follow-up. Bandlike opacities dependently along both lung bases. Atelectasis is favored. No pleural effusion. Hepatobiliary: Fatty liver infiltration. Patent portal vein. Stones in the nondilated gallbladder. Tiny peripheral cystic focus seen in segment 4 of the liver, too small to completely characterize but likely benign cysts and no specific imaging follow-up. Pancreas: Unremarkable. No pancreatic ductal dilatation or surrounding inflammatory changes. Spleen: Normal in size without focal abnormality. Adrenals/Urinary Tract: The adrenal  glands are preserved. No enhancing renal mass or collecting system dilatation. Stable Bosniak 1 upper pole right-sided renal cystic focus measuring 2.6 cm. Hounsfield unit of 2. No imaging follow-up. The ureters have normal course and caliber extending down to the bladder. Preserved contours of the urinary bladder. Stomach/Bowel: No oral contrast. Few left-sided colonic diverticula. Previously there were some wall thickening and stranding in the area of the ascending colon and cecum. This has markedly increased and there is no extraluminal air and ill-defined fluid surrounding the bowel in this location extending into the mesentery consistent with a perforation. The small bowel and stomach are nondilated. Vascular/Lymphatic: Normal caliber aorta and IVC. Minimal atherosclerotic calcifications. No developing abnormal lymph node enlargement identified. Reproductive: Prostate is unremarkable. Other: Small amount of ascites identified in the abdomen including along the margin of the liver. Musculoskeletal: Mild degenerative changes of the spine and pelvis. IMPRESSION: Progressive wall thickening along  the proximal right side of the colon with now significant surrounding extraluminal fluid, air and stranding worrisome for focal perforation. No associated obstruction. No widespread free air. Gallstones. Critical Value/emergent results were called by telephone at the time of interpretation on 07/25/2023 at 7:48 am to provider [facility] , who verbally acknowledged these results. Electronically Signed   By: Karen Kays M.D.   On: 07/25/2023 10:50   CARDIAC CATHETERIZATION  Result Date: 07/24/2023 No angiographic evidence of CAD LVEDP=17 mmHg Recommendations: No further ischemic workup.    Procedures Procedures    Medications Ordered in ED Medications  lactated ringers infusion (has no administration in time range)  chlorhexidine (PERIDEX) 0.12 % solution 15 mL (has no administration in time range)    Or  Oral care mouth rinse (has no administration in time range)  piperacillin-tazobactam (ZOSYN) IVPB 3.375 g ( Intravenous Automatically Held 08/02/23 2200)  morphine (PF) 2 MG/ML injection 1-4 mg ( Intravenous MAR Hold 07/25/23 1223)  ondansetron (ZOFRAN) injection 4 mg ( Intravenous MAR Hold 07/25/23 1223)  chlorhexidine (PERIDEX) 0.12 % solution (has no administration in time range)  sodium chloride 0.9 % bolus 1,000 mL (0 mLs Intravenous Stopped 07/25/23 1100)  morphine (PF) 4 MG/ML injection 4 mg (4 mg Intravenous Given 07/25/23 0751)  ondansetron (ZOFRAN) injection 4 mg (4 mg Intravenous Given 07/25/23 0750)  iohexol (OMNIPAQUE) 350 MG/ML injection 75 mL (75 mLs Intravenous Contrast Given 07/25/23 1003)  piperacillin-tazobactam (ZOSYN) IVPB 3.375 g (0 g Intravenous Stopped 07/25/23 1204)    ED Course/ Medical Decision Making/ A&P                                 Medical Decision Making Amount and/or Complexity of Data Reviewed Labs: ordered. Radiology: ordered.  Risk Prescription drug management. Decision regarding hospitalization.   This patient presents to the ED with chief complaint(s)  of right sided abdominal pain, nausea, vomiting, diarrhea with pertinent past medical history of colitis, HTN, hyperlipidemia .  The complaint involves an extensive differential diagnosis and also carries with it a high risk of complications and morbidity.    The differential diagnosis includes worsening colitis, bowel perforation, bowel obstruction, intra-abdominal abscess, sepsis   The initial plan is to obtain abdominal pain labs, CT abdomen/pelvis  Additional history obtained: Records reviewed previous admission documents - patient was admitted for abdominal pain with colitis suggestive of sepsis.  He had elevated troponins and had extensive cardiac workup, which was reassuring.  Patient treated with IV antibiotics and sent home  on Augmentin.  Patient discharged on 7/31.    Initial Assessment:   On exam, patient has exquisite RLQ abdominal tenderness without guarding or tenderness at McBurney's point.  No overlying skin changes or abdominal distension.  Bowel sounds are unremarkable.  He is afebrile.    Independent ECG/labs interpretation:  The following labs were independently interpreted:  CBC with mild leukocytosis at 10.9, no anemia.  Metabolic panel with mild hyponatremia, no major electrolyte disturbance.  Renal and hepatic function are both normal.  UA without evidence of infection.  Lactic acid normal.   Independent visualization and interpretation of imaging: I independently visualized the following imaging with scope of interpretation limited to determining acute life threatening conditions related to emergency care: CT abdomen pelvis, which revealed progressive wall thickening along proximal right side of colon with significant surrounding extraluminal fluid, air, and stranding worrisome for focal perforation.  No bowel obstruction or widespread free air.   Treatment and Reassessment: Patient given IV morphine and Zofran with improvement of symptoms.  Following CT results, he was  also started on IV Zosyn and kept NPO.   Consultations obtained:   I requested consultation with on-call general surgery provider and spoke with Trixie Deis, PA-C.  General surgery will come see patient.  Patient will likely need admission to medicine.   I also spoke with on-call hospitalist, Dr. Mauro Kaufmann, who agreed to evaluate patient following surgery.    Disposition:   Patient will be taken to OR emergently and admitted to hospital following procedure.           Final Clinical Impression(s) / ED Diagnoses Final diagnoses:  Bowel perforation [facility])  Colitis    Rx / DC Orders ED Discharge Orders     None         Lenard Simmer, PA-C 07/25/23 1230    Lonell Grandchild, MD 07/26/23 [phone]

## 2023-07-25 NOTE — ED Triage Notes (Signed)
Patient reports pain across abdomen with emesis and diarrhea onset yesterday , denies fever or chills .

## 2023-07-25 NOTE — Plan of Care (Signed)

## 2023-07-25 NOTE — Transfer of Care (Signed)
Immediate Anesthesia Transfer of Care Note  Patient: Carl Briggs  Procedure(s) Performed: OPEN  RIGHT COLECTOMY  Patient Location: PACU  Anesthesia Type:General  Level of Consciousness: awake, alert , and oriented  Airway & Oxygen Therapy: Patient Spontanous Breathing and Patient connected to nasal cannula oxygen  Post-op Assessment: Report given to RN and Post -op Vital signs reviewed and stable  Post vital signs: Reviewed and stable  Last Vitals:  Vitals Value Taken Time  BP 142/76 07/25/23 1545  Temp    Pulse 78 07/25/23 1545  Resp 40 07/25/23 1545  SpO2 96 % 07/25/23 1545  Vitals shown include unfiled device data.  Last Pain:  Vitals:   07/25/23 1251  TempSrc:   PainSc: 10-Worst pain ever         Complications: No notable events documented.

## 2023-07-25 NOTE — Op Note (Signed)
07/25/2023  3:25 PM  PATIENT:  Carl Briggs  52 y.o. male  PRE-OPERATIVE DIAGNOSIS:  COLITIS  POST-OPERATIVE DIAGNOSIS:  PERFORATED COLITIS WITH RETROPERITONEAL ABSCESS  PROCEDURE:  Procedure(s): RIGHT COLECTOMY DRAINAGE RETROPERITONEAL ABSCESS ILEOSTOMY  SURGEON:  Surgeon(s): Violeta Gelinas, MD  ASSISTANTS: Barnetta Chapel, PA-C   ANESTHESIA:   general  EBL:  Total I/O In: 1750 [I.V.:1000; IV Piggyback:750] Out: 600 [Urine:200; Blood:400]  BLOOD ADMINISTERED:none  DRAINS: (1) Jackson-Pratt drain(s) with closed bulb suction in the RUQ    SPECIMEN:  Excision  DISPOSITION OF SPECIMEN:  PATHOLOGY  COUNTS:  YES  DICTATION: .Dragon Dictation Findings: Severe colitis of the proximal right colon with perforation of the posterior aspect of the cecum into the retroperitoneum resulting in large retroperitoneal abscess  Procedure in detail: Informed consent was obtained.  He received intravenous antibiotics.  He was brought to the operating room and general endotracheal anesthesia was administered by the anesthesia staff.  A Foley catheter was placed by nursing.  His abdomen was prepped and draped in a sterile fashion.  We did a timeout procedure.  I made a midline incision and subcutaneous tissues were dissected down revealing the anterior fascia.  This was divided along the midline and the peritoneal cavity was entered.  The fascia was opened to the length of the incision.  The abdomen was explored.  There was a very firm inflammatory process involving the cecum and proximal right colon.  I gradually began mobilizing the right colon from the lateral peritoneal attachments and quickly entered a very large retroperitoneal abscess with the posterior portion of the cecum had perforated.  This was sent for cultures.  I irrigated out the abscess area.  I continued mobilizing the right colon from the lateral peritoneal attachments.  This continued up mobilizing the hepatic flexure and  up to the transverse colon.  I then mobilized the distal ileum from the lateral peritoneal attachments.  I divided the distal ileum with a GIA 75 stapler.  I took down the mesentery of the distal ileum and the cecum using LigaSure.  It was very inflamed and friable.  I then selected an area of the mid transverse colon that was completely normal.  This was on the right of the middle colic vessels.  I divided that with a GIA 75 stapler.  We divided the omentum with the LigaSure.  I then took down the mesentery of the transverse colon and hepatic flexure using the LigaSure.  The right colonic vessels were suture-ligated with multiple 2-0 silk sutures.  The remainder of the right colon mesentery was divided and the specimen was removed.  It was sent to pathology.  The retroperitoneal abscess area was thoroughly irrigated with multiple liters of warm saline.  Several additional 2-0 silk sutures were placed along the mesenteric dissection to get excellent hemostasis.  The duodenum appeared intact.  The gallbladder was distended but not inflamed.  The retroperitoneum area was then dry.  We placed a 75 [place] Blake drain from the right upper quadrant extending down into this area of the retroperitoneal abscess location.  Decision was made to bring out an ileostomy because I do not feel an anastomosis with safely heal in this milieu.  Circular incision was made in the right lower quadrant and subcutaneous tissues were dissected down to the fascia.  A cruciate incision was made in the fascia and carried through to the peritoneal cavity.  The distal ileum was brought out as an ileostomy.  The abdomen was copiously irrigated  in all 4 quadrants.  The retroperitoneum was checked on the right side and all looked hemostatic.  The JP drain was positioned appropriately and the bowel was run back from the distal ileum back to the ligament of Treitz and all looked okay.  The remainder of the colon looked okay.  Nasogastric tube was  positioned in the stomach.  Bowel was returned to anatomic position.  The midline fascia was closed with running #1 looped PDS.  The skin was left open due to the infection.  The ileostomy was matured with interrupted 3-0 Vicryl.  All counts were correct.  There were no apparent complications.  A sterile wet-to-dry dressing on the midline and an ostomy appliance were placed.  He tolerated the procedure well and was taken recovery in stable condition. PATIENT DISPOSITION:  PACU - hemodynamically stable.   Delay start of Pharmacological VTE agent (>24hrs) due to surgical blood loss or risk of bleeding:  no  Violeta Gelinas, MD, MPH, FACS Pager: [phone]  8/2/20243:25 PM

## 2023-07-26 MED ORDER — ACETAMINOPHEN 500 MG PO TABS
1000.0000 mg | ORAL_TABLET | Freq: Four times a day (QID) | ORAL | Status: DC
  Administered 2023-07-26 – 2023-08-08 (×49): 1000 mg via ORAL
  Filled 2023-07-26 (×50): qty 2

## 2023-07-26 MED ORDER — OXYCODONE HCL 5 MG PO TABS
5.0000 mg | ORAL_TABLET | ORAL | Status: DC | PRN
  Administered 2023-07-26 – 2023-07-27 (×3): 10 mg via ORAL
  Filled 2023-07-26 (×3): qty 2

## 2023-07-26 MED ORDER — PIPERACILLIN-TAZOBACTAM 3.375 G IVPB
3.3750 g | Freq: Three times a day (TID) | INTRAVENOUS | Status: DC
  Administered 2023-07-26 – 2023-07-31 (×17): 3.375 g via INTRAVENOUS
  Filled 2023-07-26 (×17): qty 50

## 2023-07-26 NOTE — Evaluation (Signed)
Physical Therapy Evaluation Patient Details Name: Carl Briggs MRN: [id number] DOB: Oct 15, 1971 Today's Date: 07/26/2023  History of Present Illness  The pt is a 52 yo male presenting 8/2 with abdominal pain, emesis, and diarrhea. Pt found to have perforated ascending colon and is s/p R colectomy with ileostomy on 8/2. Pt with recent admission for sepsis due to colitis and was d/c home on 8/1. PMH includes: HTN, HLD, and anxiety.   Clinical Impression  Pt in bed upon arrival of PT, agreeable to evaluation at this time. Prior to admission the pt was independent with all mobility, not using DME, working in tiling. The pt need minA to complete bed mobility due to pain in abdomen, but was then able to manage small steps and short bout of ambulation in the room with minG and use of RW. He was limited today by pain, but is hopeful to progress and be able to return home with intermittent assist. Pt will need continued skilled PT acutely to progress mobility and independence with transfers to allow for safe d/c home.       If plan is discharge home, recommend the following: A little help with walking and/or transfers;A little help with bathing/dressing/bathroom;Assistance with cooking/housework;Assist for transportation;Help with stairs or ramp for entrance   Can travel by private vehicle        Equipment Recommendations Rolling walker (2 wheels);BSC/3in1  Recommendations for Other Services       Functional Status Assessment Patient has had a recent decline in their functional status and demonstrates the ability to make significant improvements in function in a reasonable and predictable amount of time.     Precautions / Restrictions Precautions Precautions: Fall Precaution Comments: abdominal surgery, Restrictions Weight Bearing Restrictions: No      Mobility  Bed Mobility Overal bed mobility: Needs Assistance Bed Mobility: Rolling, Sidelying to Sit Rolling: Min assist Sidelying  to sit: Mod assist       General bed mobility comments: minA to roll, modA to elevate trunk    Transfers Overall transfer level: Needs assistance Equipment used: Rolling walker (2 wheels) Transfers: Sit to/from Stand, Bed to chair/wheelchair/BSC Sit to Stand: Min assist, Min guard   Step pivot transfers: Min guard       General transfer comment: minA to rise initially, then minG with future attempts. minG to pivot to recliner    Ambulation/Gait Ambulation/Gait assistance: Min guard Gait Distance (Feet): 6 Feet Assistive device: Rolling walker (2 wheels) Gait Pattern/deviations: Step-through pattern, Decreased stride length, Shuffle Gait velocity: decreased Gait velocity interpretation: <1.31 ft/sec, indicative of household ambulator   General Gait Details: pt with small steps and trunk slightly flexed. limited by 9/10 pain and SpO2 from 96 to 90% with mobility.    Balance Overall balance assessment: Needs assistance Sitting-balance support: No upper extremity supported Sitting balance-Leahy Scale: Good     Standing balance support: Bilateral upper extremity supported, During functional activity Standing balance-Leahy Scale: Fair                               Pertinent Vitals/Pain Pain Assessment Pain Assessment: 0-10 Pain Score: 9  Pain Location: abdomen Pain Descriptors / Indicators: Burning, Discomfort Pain Intervention(s): Limited activity within patient's tolerance, Patient requesting pain meds-RN notified, Monitored during session    Home Living Family/patient expects to be discharged to:: Private residence Living Arrangements: Spouse/significant other Available Help at Discharge: Family;Available PRN/intermittently (wife has to work) Type of Home:  Mobile home Home Access: Stairs to enter Entrance Stairs-Rails: Left;Right;Can reach both Entrance Stairs-Number of Steps: 3   Home Layout: One level Home Equipment: None      Prior Function  Prior Level of Function : Independent/Modified Independent;Working/employed             Mobility Comments: independent, works Water quality scientist ADLs Comments: independent     Higher education careers adviser   Dominant Hand: Right    Extremity/Trunk Assessment   Upper Extremity Assessment Upper Extremity Assessment: Overall WFL for tasks assessed    Lower Extremity Assessment Lower Extremity Assessment: Overall WFL for tasks assessed    Cervical / Trunk Assessment Cervical / Trunk Assessment: Other exceptions Cervical / Trunk Exceptions: abdominal surgery, R JP drain  Communication   Communication: Prefers language other than English  Cognition Arousal/Alertness: Awake/alert Behavior During Therapy: WFL for tasks assessed/performed Overall Cognitive Status: Within Functional Limits for tasks assessed                                          General Comments General comments (skin integrity, edema, etc.): SpO2 stable on 2L upon arrival, trialed on RA with mobility and dropped to low 90s. RN present and asked PT to return O2 at end of session.    Exercises     Assessment/Plan    PT Assessment Patient needs continued PT services  PT Problem List Decreased strength;Decreased activity tolerance;Decreased balance;Decreased mobility;Pain       PT Treatment Interventions DME instruction;Gait training;Stair training;Functional mobility training;Therapeutic activities;Therapeutic exercise;Balance training    PT Goals (Current goals can be found in the Care Plan section)  Acute Rehab PT Goals Patient Stated Goal: return home PT Goal Formulation: With patient/family Time For Goal Achievement: 08/09/23 Potential to Achieve Goals: Good    Frequency Min 3X/week        AM-PAC PT "6 Clicks" Mobility  Outcome Measure Help needed turning from your back to your side while in a flat bed without using bedrails?: A Little Help needed moving from lying on your back to sitting  on the side of a flat bed without using bedrails?: A Lot Help needed moving to and from a bed to a chair (including a wheelchair)?: A Little Help needed standing up from a chair using your arms (e.g., wheelchair or bedside chair)?: A Little Help needed to walk in hospital room?: Total (<20 ft today) Help needed climbing 3-5 steps with a railing? : Total 6 Click Score: 13    End of Session Equipment Utilized During Treatment: Oxygen Activity Tolerance: Patient tolerated treatment well Patient left: in chair;with call bell/phone within reach;with chair alarm set;with family/visitor present Nurse Communication: Mobility status PT Visit Diagnosis: Other abnormalities of gait and mobility (R26.89);Pain Pain - part of body:  (abdomen)    Time: 1431-1510 PT Time Calculation (min) (ACUTE ONLY): 39 min   Charges:   PT Evaluation $PT Eval Low Complexity: 1 Low PT Treatments $Gait Training: 8-22 mins $Therapeutic Activity: 8-22 mins PT General Charges $$ ACUTE PT VISIT: 1 Visit         Vickki Muff, PT, DPT   Acute Rehabilitation Department Office [phone] Secure Chat Communication Preferred  Carl Briggs 07/26/2023, 5:38 PM

## 2023-07-26 NOTE — Progress Notes (Signed)
[facility]  PROGRESS NOTE  Carl Briggs ZDG:[id number] DOB: 01-Dec-1971 DOA: 07/25/2023 PCP: Carl Briggs, No Pcp Per   Brief HPI:    52 y.o. male, with history of hypertension, hyperlipidemia, anxiety disorder who was just discharged from the hospital on 07/24/2023 after he was treated for sepsis due to colitis.  Carl Briggs was admitted with inflammatory terminal ileitis/ascending colitis, started on Zosyn and then discharged on p.o. Augmentin.  . Carl Briggs says due to worsening pain he came back to ED. In the ED CT scan of the abdomen/pelvis showed contained perforation in the ascending colon, no widespread free air. General surgery was consulted, and Carl Briggs went to OR for right sided colectomy and  ileostomy.    Assessment/Plan:   S/p right colectomy, ileostomy, drainage of retroperitoneal abscess -Presented with perforated colitis with retroperitoneal abscess -Underwent drainage of the abscess, right colectomy as well as ileostomy per general surgery -Continue NG tube -Continue Zosyn -Follow-up pathology report  Hypertension -Blood pressure is soft -Hold lisinopril  Anxiety -Lexapro on hold    Medications     enoxaparin (LOVENOX) injection  40 mg Subcutaneous Q24H     Data Reviewed:   CBG:  No results for input(s): "GLUCAP" in the last 168 hours.  SpO2: 95 % O2 Flow Rate (L/min): 2 L/min FiO2 (%):  (not sure what generated a MEWS notification; pt has been green while in my care)    Vitals:   07/25/23 2300 07/25/23 2313 07/26/23 0310 07/26/23 0825  BP: 114/73 114/73 104/71 116/74  Pulse: 70 79 76   Resp: 19 20 20  (!) 23  Temp: 98.4 F (36.9 C) 98.4 F (36.9 C) 98.4 F (36.9 C) 98.4 F (36.9 C)  TempSrc:  Oral Oral Oral  SpO2: 95% 95% 95%       Data Reviewed:  Basic Metabolic Panel: Recent Labs  Lab 07/19/23 2142 07/20/23 0001 07/22/23 0112 07/22/23 2324 07/25/23 0512 07/25/23 2036 07/26/23 0252  NA 129*  --  132* 133* 132*  --  133*  K  4.1  --  3.6 3.2* 4.0  --  4.4  CL 98  --  105 102 100  --  103  CO2 20*  --  19* 21* 21*  --  23  GLUCOSE 133*  --  93 100* 139*  --  224*  BUN 15  --  14 10 10   --  6  CREATININE 0.78  --  0.84 0.81 0.74 0.68 0.68  CALCIUM 8.5*  --  7.5* 7.9* 8.5*  --  8.1*  MG  --  2.2 2.3 2.0  --   --   --     CBC: Recent Labs  Lab 07/22/23 0112 07/22/23 2324 07/25/23 0512 07/25/23 2036 07/26/23 0252  WBC 13.8* 10.3 10.9* 13.3* 14.0*  HGB 11.4* 11.5* 14.0 11.7* 10.8*  HCT 33.9* 34.3* 41.6 36.5* 33.0*  MCV 88.5 88.9 89.7 91.0 90.4  PLT 182 235 339 309 292    LFT Recent Labs  Lab 07/19/23 2142 07/25/23 0512 07/26/23 0252  AST 32 27 21  ALT 25 38 31  ALKPHOS 84 80 47  BILITOT 3.3* 0.5 0.5  PROT 7.5 7.5 6.0*  ALBUMIN 3.8 2.6* 2.5*     Antibiotics: Anti-infectives (From admission, onward)    Start     Dose/Rate Route Frequency Ordered Stop   07/26/23 0030  piperacillin-tazobactam (ZOSYN) IVPB 3.375 g        3.375 g 12.5 mL/hr over 240 Minutes Intravenous Every 8 hours 07/26/23  0019     07/25/23 1400  piperacillin-tazobactam (ZOSYN) IVPB 3.375 g  Status:  Discontinued        3.375 g 12.5 mL/hr over 240 Minutes Intravenous Every 8 hours 07/25/23 1207 07/26/23 0019   07/25/23 1100  piperacillin-tazobactam (ZOSYN) IVPB 3.375 g        3.375 g 100 mL/hr over 30 Minutes Intravenous  Once 07/25/23 1051 07/25/23 1204        DVT prophylaxis: Lovenox  Code Status: Full code  Family Communication: No family at bedside   CONSULTS General surgery   Subjective   Underwent right colectomy with formation of ileostomy   Objective    Physical Examination:   General-appears in no acute distress Heart-S1-S2, regular, no murmur auscultated Lungs-clear to auscultation bilaterally, no wheezing or crackles auscultated Abdomen-soft, ileostomy in place Extremities-no edema in the lower extremities Neuro-alert, oriented x3, no focal deficit noted  Status is: Inpatient:              Meredeth Ide   [facility] If 7PM-7AM, please contact night-coverage at [email or web address], Office  [phone]   07/26/2023, 8:42 AM  LOS: 1 day

## 2023-07-26 NOTE — Progress Notes (Signed)
[facility] Progress Note  1 Day Post-Op  Subjective: CC-  Abdomen sore but pain well controlled. Denies n/v. No stool from ostomy. Has not been OOB since surgery.  Objective: Vital signs in last 24 hours: Temp:  [98 F (36.7 C)-98.4 F (36.9 C)] 98.4 F (36.9 C) (08/03 0825) Pulse Rate:  [70-106] 76 (08/03 0310) Resp:  [14-29] 23 (08/03 0825) BP: (104-142)/(71-85) 116/74 (08/03 0825) SpO2:  [92 %-96 %] 95 % (08/03 0310) Last BM Date :  (PTA)  Intake/Output from previous day: 08/02 0701 - 08/03 0700 In: 2982.7 [I.V.:2113.5; NG/GT:30; IV Piggyback:839.2] Out: 3245 [Urine:2700; Drains:135; Stool:10; Blood:400] Intake/Output this shift: No intake/output data recorded.  PE: Gen:  Alert, NAD, pleasant Card:  RRR Pulm:  CTAB, no W/R/R, rate and effort normal  Abd: Soft, ND, appropriately tender, open midline healthy without erythema or drainage, JP serosanguinous, ostomy viable with some dark bloody drainage in pouch GU: Foley with clear yellow urine  Lab Results:  Recent Labs    07/25/23 2036 07/26/23 0252  WBC 13.3* 14.0*  HGB 11.7* 10.8*  HCT 36.5* 33.0*  PLT 309 292   BMET Recent Labs    07/25/23 0512 07/25/23 2036 07/26/23 0252  NA 132*  --  133*  K 4.0  --  4.4  CL 100  --  103  CO2 21*  --  23  GLUCOSE 139*  --  224*  BUN 10  --  6  CREATININE 0.74 0.68 0.68  CALCIUM 8.5*  --  8.1*   PT/INR No results for input(s): "LABPROT", "INR" in the last 72 hours. CMP     Component Value Date/Time   NA 133 (L) 07/26/2023 0252   NA 141 12/14/2018 1545   K 4.4 07/26/2023 0252   CL 103 07/26/2023 0252   CO2 23 07/26/2023 0252   GLUCOSE 224 (H) 07/26/2023 0252   BUN 6 07/26/2023 0252   BUN 14 12/14/2018 1545   CREATININE 0.68 07/26/2023 0252   CALCIUM 8.1 (L) 07/26/2023 0252   PROT 6.0 (L) 07/26/2023 0252   PROT 7.3 12/14/2018 1545   ALBUMIN 2.5 (L) 07/26/2023 0252   ALBUMIN 4.1 12/14/2018 1545   AST 21 07/26/2023 0252   ALT 31 07/26/2023  0252   ALKPHOS 47 07/26/2023 0252   BILITOT 0.5 07/26/2023 0252   BILITOT 0.4 12/14/2018 1545   GFRNONAA >60 07/26/2023 0252   GFRAA 118 12/14/2018 1545   Lipase     Component Value Date/Time   LIPASE 34 07/25/2023 0512       Studies/Results: DG Abd 1 View  Result Date: 07/25/2023 CLINICAL DATA:  Encounter for nasogastric tube placement. EXAM: ABDOMEN - 1 VIEW COMPARISON:  CT abdomen and pelvis 07/25/2023 FINDINGS: Nasogastric tube extends into the abdomen. The tip is overlying the right side of the abdomen likely in the distal stomach or proximal duodenal region. Patchy densities at the lung bases may represent atelectasis. Nonobstructive bowel gas pattern in the abdomen. IMPRESSION: Nasogastric tube tip is overlying the right side of the abdomen, likely in the distal stomach region. Electronically Signed   By: Richarda Overlie M.D.   On: 07/25/2023 16:09   CT ABDOMEN PELVIS W CONTRAST  Result Date: 07/25/2023 CLINICAL DATA:  Right lower quadrant pain. Recent diagnosis of colitis. EXAM: CT ABDOMEN AND PELVIS WITH CONTRAST TECHNIQUE: Multidetector CT imaging of the abdomen and pelvis was performed using the standard protocol following bolus administration of intravenous contrast. RADIATION DOSE REDUCTION: This exam was performed according to  the departmental dose-optimization program which includes automated exposure control, adjustment of the mA and/or kV according to patient size and/or use of iterative reconstruction technique. CONTRAST:  75mL OMNIPAQUE IOHEXOL 350 MG/ML SOLN COMPARISON:  CT 07/19/2023 FINDINGS: Lower chest: 3-4 mm left lower lobe lung nodule stable going back to at least November 2019. This demonstrates long-term stability. No additional imaging follow-up. Bandlike opacities dependently along both lung bases. Atelectasis is favored. No pleural effusion. Hepatobiliary: Fatty liver infiltration. Patent portal vein. Stones in the nondilated gallbladder. Tiny peripheral cystic focus  seen in segment 4 of the liver, too small to completely characterize but likely benign cysts and no specific imaging follow-up. Pancreas: Unremarkable. No pancreatic ductal dilatation or surrounding inflammatory changes. Spleen: Normal in size without focal abnormality. Adrenals/Urinary Tract: The adrenal glands are preserved. No enhancing renal mass or collecting system dilatation. Stable Bosniak 1 upper pole right-sided renal cystic focus measuring 2.6 cm. Hounsfield unit of 2. No imaging follow-up. The ureters have normal course and caliber extending down to the bladder. Preserved contours of the urinary bladder. Stomach/Bowel: No oral contrast. Few left-sided colonic diverticula. Previously there were some wall thickening and stranding in the area of the ascending colon and cecum. This has markedly increased and there is no extraluminal air and ill-defined fluid surrounding the bowel in this location extending into the mesentery consistent with a perforation. The small bowel and stomach are nondilated. Vascular/Lymphatic: Normal caliber aorta and IVC. Minimal atherosclerotic calcifications. No developing abnormal lymph node enlargement identified. Reproductive: Prostate is unremarkable. Other: Small amount of ascites identified in the abdomen including along the margin of the liver. Musculoskeletal: Mild degenerative changes of the spine and pelvis. IMPRESSION: Progressive wall thickening along the proximal right side of the colon with now significant surrounding extraluminal fluid, air and stranding worrisome for focal perforation. No associated obstruction. No widespread free air. Gallstones. Critical Value/emergent results were called by telephone at the time of interpretation on 07/25/2023 at 7:48 am to provider [facility] , who verbally acknowledged these results. Electronically Signed   By: Karen Kays M.D.   On: 07/25/2023 10:50    Anti-infectives: Anti-infectives (From admission, onward)    Start      Dose/Rate Route Frequency Ordered Stop   07/26/23 0030  piperacillin-tazobactam (ZOSYN) IVPB 3.375 g        3.375 g 12.5 mL/hr over 240 Minutes Intravenous Every 8 hours 07/26/23 0019     07/25/23 1400  piperacillin-tazobactam (ZOSYN) IVPB 3.375 g  Status:  Discontinued        3.375 g 12.5 mL/hr over 240 Minutes Intravenous Every 8 hours 07/25/23 1207 07/26/23 0019   07/25/23 1100  piperacillin-tazobactam (ZOSYN) IVPB 3.375 g        3.375 g 100 mL/hr over 30 Minutes Intravenous  Once 07/25/23 1051 07/25/23 1204        Assessment/Plan PERFORATED COLITIS WITH RETROPERITONEAL ABSCESS  -POD#1 s/p RIGHT COLECTOMY, DRAINAGE RETROPERITONEAL ABSCESS, 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 Janee Morn - path pending - d/c NG and start clear liquids - mobilize, PT - BID wet to dry dressing changes to midline abdominal wound - WOC c/s for new ostomy - continue JP - currently serosanguinous - continue abx  ID - zosyn 8/2>> FEN - IVF, CLD VTE - SCDs, lovenox Foley - d/c 8/3  Uncontrolled HTN - per medicine Recent admit for elevated troponin, felt likely demand ischemia - left heart cath negative for CAD     LOS: 1 day    Franne Forts, PA-C [facility] 07/26/2023, 10:55 AM Please see Amion for pager number during day hours 7:00am-4:30pm

## 2023-07-26 NOTE — Plan of Care (Signed)

## 2023-07-27 ENCOUNTER — Encounter (HOSPITAL_COMMUNITY): Payer: Self-pay | Admitting: General Surgery

## 2023-07-27 LAB — CBC
HCT: 31.9 % — ABNORMAL LOW (ref 39.0–52.0)
Hemoglobin: 10.7 g/dL — ABNORMAL LOW (ref 13.0–17.0)
MCH: 30 pg (ref 26.0–34.0)
MCHC: 33.5 g/dL (ref 30.0–36.0)
MCV: 89.4 fL (ref 80.0–100.0)
Platelets: 305 10*3/uL (ref 150–400)
RBC: 3.57 MIL/uL — ABNORMAL LOW (ref 4.22–5.81)
RDW: 14.2 % (ref 11.5–15.5)
WBC: 12.2 10*3/uL — ABNORMAL HIGH (ref 4.0–10.5)
nRBC: 0 % (ref 0.0–0.2)

## 2023-07-27 LAB — BASIC METABOLIC PANEL WITH GFR
Anion gap: 8 (ref 5–15)
BUN: 7 mg/dL (ref 6–20)
CO2: 23 mmol/L (ref 22–32)
Calcium: 7.9 mg/dL — ABNORMAL LOW (ref 8.9–10.3)
Chloride: 101 mmol/L (ref 98–111)
Creatinine, Ser: 0.74 mg/dL (ref 0.61–1.24)
GFR, Estimated: >60 mL/min (ref >60)
Glucose, Bld: 140 mg/dL — ABNORMAL HIGH (ref 70–99)
Potassium: 4.7 mmol/L (ref 3.5–5.1)
Sodium: 132 mmol/L — ABNORMAL LOW (ref 135–145)

## 2023-07-27 MED ORDER — ENSURE ENLIVE PO LIQD
237.0000 mL | Freq: Two times a day (BID) | ORAL | Status: DC
  Administered 2023-07-27 – 2023-08-08 (×20): 237 mL via ORAL

## 2023-07-27 MED ORDER — OXYCODONE HCL 5 MG PO TABS
5.0000 mg | ORAL_TABLET | ORAL | Status: DC | PRN
  Administered 2023-07-27 – 2023-08-02 (×20): 10 mg via ORAL
  Filled 2023-07-27 (×21): qty 2

## 2023-07-27 MED ORDER — MORPHINE SULFATE (PF) 2 MG/ML IV SOLN
1.0000 mg | INTRAVENOUS | Status: DC | PRN
  Administered 2023-07-27 – 2023-07-30 (×5): 2 mg via INTRAVENOUS
  Administered 2023-07-31: 4 mg via INTRAVENOUS
  Administered 2023-07-31: 2 mg via INTRAVENOUS
  Administered 2023-08-01: 4 mg via INTRAVENOUS
  Filled 2023-07-27 (×5): qty 1
  Filled 2023-07-27 (×2): qty 2
  Filled 2023-07-27: qty 1

## 2023-07-27 MED ORDER — METHOCARBAMOL 500 MG PO TABS
500.0000 mg | ORAL_TABLET | Freq: Four times a day (QID) | ORAL | Status: DC
  Administered 2023-07-27 – 2023-08-02 (×24): 500 mg via ORAL
  Filled 2023-07-27 (×25): qty 1

## 2023-07-27 NOTE — Plan of Care (Signed)

## 2023-07-27 NOTE — Progress Notes (Signed)
[facility] Progress Note  2 Days Post-Op  Subjective: CC-  Wife at bedside. Doing well with clear liquids. Denies n/v. Ostomy productive. Pain well controlled. Did well with therapy, no PT follow up recommended.  Objective: Vital signs in last 24 hours: Temp:  [98.6 F (37 C)-99.3 F (37.4 C)] 99.3 F (37.4 C) (08/04 0749) Pulse Rate:  [74-91] 88 (08/04 0749) Resp:  [20-27] 27 (08/04 0749) BP: (103-120)/(69-82) 118/82 (08/04 0749) SpO2:  [94 %-96 %] 94 % (08/04 0749) Last BM Date : 07/26/23  Intake/Output from previous day: 08/03 0701 - 08/04 0700 In: 3750.9 [P.O.:300; I.V.:3238.5; IV Piggyback:212.4] Out: 1550 [Urine:1500; Drains:35; Stool:15] Intake/Output this shift: No intake/output data recorded.  PE:  Gen:  Alert, NAD, pleasant Card:  RRR Pulm:  rate and effort normal  Abd: Soft, ND, appropriately tender, open midline healthy without erythema or drainage, JP serosanguinous, ostomy viable with thin dark stool and air in pouch  Lab Results:  Recent Labs    07/26/23 0252 07/27/23 0116  WBC 14.0* 12.2*  HGB 10.8* 10.7*  HCT 33.0* 31.9*  PLT 292 305   BMET Recent Labs    07/26/23 0252 07/27/23 0116  NA 133* 132*  K 4.4 4.7  CL 103 101  CO2 23 23  GLUCOSE 224* 140*  BUN 6 7  CREATININE 0.68 0.74  CALCIUM 8.1* 7.9*   PT/INR No results for input(s): "LABPROT", "INR" in the last 72 hours. CMP     Component Value Date/Time   NA 132 (L) 07/27/2023 0116   NA 141 12/14/2018 1545   K 4.7 07/27/2023 0116   CL 101 07/27/2023 0116   CO2 23 07/27/2023 0116   GLUCOSE 140 (H) 07/27/2023 0116   BUN 7 07/27/2023 0116   BUN 14 12/14/2018 1545   CREATININE 0.74 07/27/2023 0116   CALCIUM 7.9 (L) 07/27/2023 0116   PROT 6.0 (L) 07/26/2023 0252   PROT 7.3 12/14/2018 1545   ALBUMIN 2.5 (L) 07/26/2023 0252   ALBUMIN 4.1 12/14/2018 1545   AST 21 07/26/2023 0252   ALT 31 07/26/2023 0252   ALKPHOS 47 07/26/2023 0252   BILITOT 0.5 07/26/2023 0252    BILITOT 0.4 12/14/2018 1545   GFRNONAA >60 07/27/2023 0116   GFRAA 118 12/14/2018 1545   Lipase     Component Value Date/Time   LIPASE 34 07/25/2023 0512       Studies/Results: DG Abd 1 View  Result Date: 07/25/2023 CLINICAL DATA:  Encounter for nasogastric tube placement. EXAM: ABDOMEN - 1 VIEW COMPARISON:  CT abdomen and pelvis 07/25/2023 FINDINGS: Nasogastric tube extends into the abdomen. The tip is overlying the right side of the abdomen likely in the distal stomach or proximal duodenal region. Patchy densities at the lung bases may represent atelectasis. Nonobstructive bowel gas pattern in the abdomen. IMPRESSION: Nasogastric tube tip is overlying the right side of the abdomen, likely in the distal stomach region. Electronically Signed   By: Richarda Overlie M.D.   On: 07/25/2023 16:09   CT ABDOMEN PELVIS W CONTRAST  Result Date: 07/25/2023 CLINICAL DATA:  Right lower quadrant pain. Recent diagnosis of colitis. EXAM: CT ABDOMEN AND PELVIS WITH CONTRAST TECHNIQUE: Multidetector CT imaging of the abdomen and pelvis was performed using the standard protocol following bolus administration of intravenous contrast. RADIATION DOSE REDUCTION: This exam was performed according to the departmental dose-optimization program which includes automated exposure control, adjustment of the mA and/or kV according to patient size and/or use of iterative reconstruction technique. CONTRAST:  75mL OMNIPAQUE IOHEXOL 350 MG/ML SOLN COMPARISON:  CT 07/19/2023 FINDINGS: Lower chest: 3-4 mm left lower lobe lung nodule stable going back to at least November 2019. This demonstrates long-term stability. No additional imaging follow-up. Bandlike opacities dependently along both lung bases. Atelectasis is favored. No pleural effusion. Hepatobiliary: Fatty liver infiltration. Patent portal vein. Stones in the nondilated gallbladder. Tiny peripheral cystic focus seen in segment 4 of the liver, too small to completely characterize  but likely benign cysts and no specific imaging follow-up. Pancreas: Unremarkable. No pancreatic ductal dilatation or surrounding inflammatory changes. Spleen: Normal in size without focal abnormality. Adrenals/Urinary Tract: The adrenal glands are preserved. No enhancing renal mass or collecting system dilatation. Stable Bosniak 1 upper pole right-sided renal cystic focus measuring 2.6 cm. Hounsfield unit of 2. No imaging follow-up. The ureters have normal course and caliber extending down to the bladder. Preserved contours of the urinary bladder. Stomach/Bowel: No oral contrast. Few left-sided colonic diverticula. Previously there were some wall thickening and stranding in the area of the ascending colon and cecum. This has markedly increased and there is no extraluminal air and ill-defined fluid surrounding the bowel in this location extending into the mesentery consistent with a perforation. The small bowel and stomach are nondilated. Vascular/Lymphatic: Normal caliber aorta and IVC. Minimal atherosclerotic calcifications. No developing abnormal lymph node enlargement identified. Reproductive: Prostate is unremarkable. Other: Small amount of ascites identified in the abdomen including along the margin of the liver. Musculoskeletal: Mild degenerative changes of the spine and pelvis. IMPRESSION: Progressive wall thickening along the proximal right side of the colon with now significant surrounding extraluminal fluid, air and stranding worrisome for focal perforation. No associated obstruction. No widespread free air. Gallstones. Critical Value/emergent results were called by telephone at the time of interpretation on 07/25/2023 at 7:48 am to provider [facility] , who verbally acknowledged these results. Electronically Signed   By: Karen Kays M.D.   On: 07/25/2023 10:50    Anti-infectives: Anti-infectives (From admission, onward)    Start     Dose/Rate Route Frequency Ordered Stop   07/26/23 0030   piperacillin-tazobactam (ZOSYN) IVPB 3.375 g        3.375 g 12.5 mL/hr over 240 Minutes Intravenous Every 8 hours 07/26/23 0019     07/25/23 1400  piperacillin-tazobactam (ZOSYN) IVPB 3.375 g  Status:  Discontinued        3.375 g 12.5 mL/hr over 240 Minutes Intravenous Every 8 hours 07/25/23 1207 07/26/23 0019   07/25/23 1100  piperacillin-tazobactam (ZOSYN) IVPB 3.375 g        3.375 g 100 mL/hr over 30 Minutes Intravenous  Once 07/25/23 1051 07/25/23 1204        Assessment/Plan PERFORATED COLITIS WITH RETROPERITONEAL ABSCESS  -POD#2 s/p RIGHT COLECTOMY, DRAINAGE RETROPERITONEAL ABSCESS, 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 Janee Morn - path pending - bowel function returning, advance to Full liquids and as tolerated to soft diet. Transition to oral pain medication regimen - mobilize, PT - BID wet to dry dressing changes to midline abdominal wound - WOC c/s for new ostomy. Will need ostomy clinic referral upon discharge - continue JP - currently serosanguinous - continue abx   ID - zosyn 8/2>> FEN - IVF, FLD ADAT VTE - SCDs, lovenox Foley - out 8/3 and voiding   Uncontrolled HTN - per medicine Recent admit for elevated troponin, felt likely demand ischemia - left heart cath negative for CAD    LOS: 2 days    Franne Forts, [facility] 07/27/2023,  9:04 AM Please see Amion for pager number during day hours 7:00am-4:30pm

## 2023-07-27 NOTE — Progress Notes (Signed)
[facility]  PROGRESS NOTE  Carl Briggs AOZ:[id number] DOB: 1971-01-25 DOA: 07/25/2023 PCP: Patient, No Pcp Per   Brief HPI:    52 y.o. male, with history of hypertension, hyperlipidemia, anxiety disorder who was just discharged from the hospital on 07/24/2023 after he was treated for sepsis due to colitis.  Patient was admitted with inflammatory terminal ileitis/ascending colitis, started on Zosyn and then discharged on p.o. Augmentin.  . Patient says due to worsening pain he came back to ED. In the ED CT scan of the abdomen/pelvis showed contained perforation in the ascending colon, no widespread free air. General surgery was consulted, and patient went to OR for right sided colectomy and  ileostomy.    Assessment/Plan:   S/p right colectomy, ileostomy, drainage of retroperitoneal abscess -Presented with perforated colitis with retroperitoneal abscess -Underwent drainage of the abscess, right colectomy as well as ileostomy per general surgery -NG tube discontinued; started on clear liquid diet -Stool noted in ostomy bag -Continue Zosyn -Follow-up pathology report  Hypertension -Blood pressure is soft -Hold lisinopril  Anxiety -Lexapro on hold  Elevated troponin during previous admission -Recently was seen by cardiology, underwent cardiac catheterization on 07/23/2023 with normal coronaries  Medications     acetaminophen  1,000 mg Oral Q6H   enoxaparin (LOVENOX) injection  40 mg Subcutaneous Q24H     Data Reviewed:   CBG:  No results for input(s): "GLUCAP" in the last 168 hours.  SpO2: 94 % O2 Flow Rate (L/min): 2 L/min FiO2 (%):  (not sure what generated a MEWS notification; pt has been green while in my care)    Vitals:   07/26/23 1928 07/26/23 2310 07/27/23 0312 07/27/23 0749  BP: 115/82 103/69 120/76 118/82  Pulse: 91 74 91 88  Resp: 20 20 20  (!) 27  Temp: 98.8 F (37.1 C) 98.6 F (37 C) 99.3 F (37.4 C) 99.3 F (37.4 C)  TempSrc: Oral Oral  Oral Oral  SpO2: 96% 96% 95% 94%      Data Reviewed:  Basic Metabolic Panel: Recent Labs  Lab 07/22/23 0112 07/22/23 2324 07/25/23 0512 07/25/23 2036 07/26/23 0252 07/27/23 0116  NA 132* 133* 132*  --  133* 132*  K 3.6 3.2* 4.0  --  4.4 4.7  CL 105 102 100  --  103 101  CO2 19* 21* 21*  --  23 23  GLUCOSE 93 100* 139*  --  224* 140*  BUN 14 10 10   --  6 7  CREATININE 0.84 0.81 0.74 0.68 0.68 0.74  CALCIUM 7.5* 7.9* 8.5*  --  8.1* 7.9*  MG 2.3 2.0  --   --   --   --     CBC: Recent Labs  Lab 07/22/23 2324 07/25/23 0512 07/25/23 2036 07/26/23 0252 07/27/23 0116  WBC 10.3 10.9* 13.3* 14.0* 12.2*  HGB 11.5* 14.0 11.7* 10.8* 10.7*  HCT 34.3* 41.6 36.5* 33.0* 31.9*  MCV 88.9 89.7 91.0 90.4 89.4  PLT 235 339 309 292 305    LFT Recent Labs  Lab 07/25/23 0512 07/26/23 0252  AST 27 21  ALT 38 31  ALKPHOS 80 47  BILITOT 0.5 0.5  PROT 7.5 6.0*  ALBUMIN 2.6* 2.5*     Antibiotics: Anti-infectives (From admission, onward)    Start     Dose/Rate Route Frequency Ordered Stop   07/26/23 0030  piperacillin-tazobactam (ZOSYN) IVPB 3.375 g        3.375 g 12.5 mL/hr over 240 Minutes Intravenous Every 8 hours 07/26/23  0019     07/25/23 1400  piperacillin-tazobactam (ZOSYN) IVPB 3.375 g  Status:  Discontinued        3.375 g 12.5 mL/hr over 240 Minutes Intravenous Every 8 hours 07/25/23 1207 07/26/23 0019   07/25/23 1100  piperacillin-tazobactam (ZOSYN) IVPB 3.375 g        3.375 g 100 mL/hr over 30 Minutes Intravenous  Once 07/25/23 1051 07/25/23 1204        DVT prophylaxis: Lovenox  Code Status: Full code  Family Communication: No family at bedside   CONSULTS General surgery   Subjective    Passing stool in ostomy bag.  Started on clear liquid diet.  Objective    Physical Examination:   General-appears in no acute distress Heart-S1-S2, regular, no murmur auscultated Lungs-clear to auscultation bilaterally, no wheezing or crackles  auscultated Abdomen-soft, ileostomy in place, tenderness to palpation in right lower quadrant Extremities-no edema in the lower extremities Neuro-alert, oriented x3, no focal deficit noted  Status is: Inpatient:             Meredeth Ide   [facility] If 7PM-7AM, please contact night-coverage at [email or web address], Office  [phone]   07/27/2023, 8:45 AM  LOS: 2 days

## 2023-07-28 NOTE — Progress Notes (Addendum)
[facility] Progress Note  3 Days Post-Op  Subjective: Pain controlled.  Ambulated to the bathroom and back only.  Tolerating FLD with no nausea or vomiting.  Ileostomy working well.  Objective: Vital signs in last 24 hours: Temp:  [98.3 F (36.8 C)-99 F (37.2 C)] 98.4 F (36.9 C) (08/05 0812) Pulse Rate:  [83-118] 83 (08/05 0812) Resp:  [21-39] 23 (08/05 0812) BP: (104-136)/(70-88) 106/76 (08/05 0812) SpO2:  [87 %-98 %] 98 % (08/05 0812) Last BM Date : 07/27/23  Intake/Output from previous day: 08/04 0701 - 08/05 0700 In: 3274.9 [I.V.:3144.6; IV Piggyback:130.3] Out: 1055 [Urine:400; Drains:95; Stool:560] Intake/Output this shift: No intake/output data recorded.  PE:  Gen:  Alert, NAD, pleasant Card:  RRR Pulm:  rate and effort normal  Abd: Soft, ND, appropriately tender, open midline with some necrosis at the very base of the wound, especially mid to inferior aspects. JP serosanguinous, ostomy viable with thin dark stool and air in pouch  Lab Results:  Recent Labs    07/26/23 0252 07/27/23 0116  WBC 14.0* 12.2*  HGB 10.8* 10.7*  HCT 33.0* 31.9*  PLT 292 305   BMET Recent Labs    07/26/23 0252 07/27/23 0116  NA 133* 132*  K 4.4 4.7  CL 103 101  CO2 23 23  GLUCOSE 224* 140*  BUN 6 7  CREATININE 0.68 0.74  CALCIUM 8.1* 7.9*   PT/INR No results for input(s): "LABPROT", "INR" in the last 72 hours. CMP     Component Value Date/Time   NA 132 (L) 07/27/2023 0116   NA 141 12/14/2018 1545   K 4.7 07/27/2023 0116   CL 101 07/27/2023 0116   CO2 23 07/27/2023 0116   GLUCOSE 140 (H) 07/27/2023 0116   BUN 7 07/27/2023 0116   BUN 14 12/14/2018 1545   CREATININE 0.74 07/27/2023 0116   CALCIUM 7.9 (L) 07/27/2023 0116   PROT 6.0 (L) 07/26/2023 0252   PROT 7.3 12/14/2018 1545   ALBUMIN 2.5 (L) 07/26/2023 0252   ALBUMIN 4.1 12/14/2018 1545   AST 21 07/26/2023 0252   ALT 31 07/26/2023 0252   ALKPHOS 47 07/26/2023 0252   BILITOT 0.5 07/26/2023  0252   BILITOT 0.4 12/14/2018 1545   GFRNONAA >60 07/27/2023 0116   GFRAA 118 12/14/2018 1545   Lipase     Component Value Date/Time   LIPASE 34 07/25/2023 0512       Studies/Results: No results found.  Anti-infectives: Anti-infectives (From admission, onward)    Start     Dose/Rate Route Frequency Ordered Stop   07/26/23 0030  piperacillin-tazobactam (ZOSYN) IVPB 3.375 g        3.375 g 12.5 mL/hr over 240 Minutes Intravenous Every 8 hours 07/26/23 0019     07/25/23 1400  piperacillin-tazobactam (ZOSYN) IVPB 3.375 g  Status:  Discontinued        3.375 g 12.5 mL/hr over 240 Minutes Intravenous Every 8 hours 07/25/23 1207 07/26/23 0019   07/25/23 1100  piperacillin-tazobactam (ZOSYN) IVPB 3.375 g        3.375 g 100 mL/hr over 30 Minutes Intravenous  Once 07/25/23 1051 07/25/23 1204        Assessment/Plan POD#3 s/p RIGHT COLECTOMY, DRAINAGE RETROPERITONEAL ABSCESS, 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 Janee Morn for perforated colitis with RTP abscess - path pending - bowel function returning, advance to regular diet - oral pain medication regimen - mobilize, IS, discussed ambulating around the unit - BID wet to dry dressing changes to midline abdominal wound - WOC  c/s for new ostomy. Will need ostomy clinic referral upon discharge - continue JP - currently serosanguinous - continue abx - will write for [facility], but unclear he will get it as he is uninsured   ID - zosyn 8/2>> FEN - IVF, regular diet VTE - SCDs, lovenox Foley - out 8/3 and voiding   Uncontrolled HTN - per medicine Recent admit for elevated troponin, felt likely demand ischemia - left heart cath negative for CAD    LOS: 3 days    Letha Cape, [facility] 07/28/2023, 8:35 AM Please see Amion for pager number during day hours 7:00am-4:30pm

## 2023-07-28 NOTE — Consult Note (Signed)
WOC Nurse ostomy consult note Using Spanish translator services at bedside  Stoma type/location: RLQ, end ileostomy  Stomal assessment/size: 1 5/8" round, budded, pink, moist  Peristomal assessment: intact  Treatment options for stomal/peristomal skin: 2" skin barrier ring Output liquid; green Ostomy pouching: 2pc. 2 3/4" (aperture cut off center) to accommodate wound Education provided:  All educational materials provided in Spanish Explained role of ostomy nurse and creation of stoma  Explained stoma characteristics (budded, flush, color, texture, care) Demonstrated pouch change (cutting new skin barrier, measuring stoma, cleaning peristomal skin and stoma, use of barrier ring) Education on emptying when 1/3 to 1/2 full and how to empty Demonstrated use of wick to clean spout   Planned visit with wife for Wednesday at 0730am     Enrolled patient in [employer] DC program: Yes, verified address (3) 2pc pouching systems left in the patient's room for use.   WOC Nurse will follow along with you for continued support with ostomy teaching and care Kenneth Cuaresma [facility], RN, [place], CNS, [place] [phone]

## 2023-07-28 NOTE — Progress Notes (Signed)
Physical Therapy Treatment Patient Details Name: Carl Briggs MRN: [id number] DOB: [birth year] Today's Date: 07/28/2023   History of Present Illness The pt is a 52 yo male presenting 8/2 with abdominal pain, emesis, and diarrhea. Pt found to have perforated ascending colon and is s/p R colectomy with ileostomy on 8/2. Pt with recent admission for sepsis due to colitis and was d/c home on 8/1. PMH includes: HTN, HLD, and anxiety.    PT Comments  Pt received in bed and requesting pain meds, RN gave before session. Pt progressing with tolerance for mobility but needs encouragement because tends to think that if he hurts he should not move around. Pt needed min  HHA to come to EOB. Pt ambulated 150' with RW and min guard A. RW did help pt tolerate abdominal pain. Discussed recommended daily activity level as well as goal to sit up in chair later today. Video interpreter was used during session. PT will continue to follow.     If plan is discharge home, recommend the following: A little help with walking and/or transfers;A little help with bathing/dressing/bathroom;Assistance with cooking/housework;Assist for transportation;Help with stairs or ramp for entrance   Can travel by private vehicle        Equipment Recommendations  Rolling walker (2 wheels);BSC/3in1    Recommendations for Other Services       Precautions / Restrictions Precautions Precautions: Fall Precaution Comments: abdominal surgery, Restrictions Weight Bearing Restrictions: No     Mobility  Bed Mobility Overal bed mobility: Needs Assistance Bed Mobility: Rolling, Sidelying to Sit Rolling: Supervision Sidelying to sit: Min assist       General bed mobility comments: pt able to roll to L with use of rail, min HHA for SL to sit    Transfers Overall transfer level: Needs assistance Equipment used: Rolling walker (2 wheels) Transfers: Sit to/from Stand Sit to Stand: Min guard           General transfer  comment: vc's for hand placement    Ambulation/Gait Ambulation/Gait assistance: Min guard Gait Distance (Feet): 150 Feet Assistive device: Rolling walker (2 wheels) Gait Pattern/deviations: Step-through pattern, Decreased stride length, Shuffle Gait velocity: decreased Gait velocity interpretation: <1.8 ft/sec, indicate of risk for recurrent falls   General Gait Details: pt with small steps and trunk slightly flexed. VSS throughout. Pt reports mildly increased pain with ambulation and that RW does help tolerate pain   Stairs             Wheelchair Mobility     Tilt Bed    Modified Rankin (Stroke Patients Only)       Balance Overall balance assessment: Needs assistance Sitting-balance support: No upper extremity supported Sitting balance-Leahy Scale: Good     Standing balance support: Bilateral upper extremity supported, During functional activity Standing balance-Leahy Scale: Fair                              Cognition Arousal/Alertness: Awake/alert Behavior During Therapy: WFL for tasks assessed/performed Overall Cognitive Status: Within Functional Limits for tasks assessed                                          Exercises      General Comments General comments (skin integrity, edema, etc.): Interpreter Mikle Bosworth [phone].   Pt donned shoes for ambulation. Pt requested back to  bed due to pain. Instructed him to get up to chair with RN at some point in the day today      Pertinent Vitals/Pain Pain Assessment Pain Assessment: 0-10 Pain Score: 8  Pain Location: abdomen Pain Descriptors / Indicators: Burning, Discomfort Pain Intervention(s): Limited activity within patient's tolerance, Monitored during session, Premedicated before session    Home Living                          Prior Function            PT Goals (current goals can now be found in the care plan section) Acute Rehab PT Goals Patient Stated Goal:  return home PT Goal Formulation: With patient/family Time For Goal Achievement: 08/09/23 Potential to Achieve Goals: Good Progress towards PT goals: Progressing toward goals    Frequency    Min 1X/week      PT Plan Current plan remains appropriate    Co-evaluation              AM-PAC PT "6 Clicks" Mobility   Outcome Measure  Help needed turning from your back to your side while in a flat bed without using bedrails?: A Little Help needed moving from lying on your back to sitting on the side of a flat bed without using bedrails?: A Little Help needed moving to and from a bed to a chair (including a wheelchair)?: A Little Help needed standing up from a chair using your arms (e.g., wheelchair or bedside chair)?: A Little Help needed to walk in hospital room?: A Little Help needed climbing 3-5 steps with a railing? : A Lot 6 Click Score: 17    End of Session Equipment Utilized During Treatment: Gait belt Activity Tolerance: Patient tolerated treatment well Patient left: with call bell/phone within reach;in bed Nurse Communication: Mobility status PT Visit Diagnosis: Other abnormalities of gait and mobility (R26.89);Pain Pain - part of body:  (abdomen)     Time: 1610-9604 PT Time Calculation (min) (ACUTE ONLY): 28 min  Charges:    $Gait Training: 23-37 mins PT General Charges $$ ACUTE PT VISIT: 1 Visit                     Lyanne Co, PT  Acute Rehab Services Secure chat preferred Office [phone]    Lawana Chambers Natallia Stellmach 07/28/2023, 2:51 PM

## 2023-07-28 NOTE — Anesthesia Postprocedure Evaluation (Signed)
Anesthesia Post Note  Patient: Carl Briggs  Procedure(s) Performed: OPEN  RIGHT COLECTOMY     Patient location during evaluation: PACU Anesthesia Type: General Level of consciousness: awake and alert Pain management: pain level controlled Vital Signs Assessment: post-procedure vital signs reviewed and stable Respiratory status: spontaneous breathing, nonlabored ventilation, respiratory function stable and patient connected to nasal cannula oxygen Cardiovascular status: blood pressure returned to baseline and stable Postop Assessment: no apparent nausea or vomiting Anesthetic complications: no   No notable events documented.  Last Vitals:  Vitals:   07/28/23 0323 07/28/23 0812  BP: 104/77 106/76  Pulse: 90 83  Resp: (!) 23 (!) 23  Temp: 36.8 C 36.9 C  SpO2: 98% 98%    Last Pain:  Vitals:   07/28/23 0812  TempSrc: Oral  PainSc:                  Badr Piedra

## 2023-07-28 NOTE — Progress Notes (Signed)
[facility]  PROGRESS NOTE  Masin Mucha HYQ:[id number] DOB: 10-14-1971 DOA: 07/25/2023 PCP: Patient, No Pcp Per   Brief HPI:    52 y.o. male, with history of hypertension, hyperlipidemia, anxiety disorder who was just discharged from the hospital on 07/24/2023 after he was treated for sepsis due to colitis.  Patient was admitted with inflammatory terminal ileitis/ascending colitis, started on Zosyn and then discharged on p.o. Augmentin.  . Patient says due to worsening pain he came back to ED. In the ED CT scan of the abdomen/pelvis showed contained perforation in the ascending colon, no widespread free air. General surgery was consulted, and patient went to OR for right sided colectomy and  ileostomy.    Assessment/Plan:   S/p right colectomy, ileostomy, drainage of retroperitoneal abscess -Presented with perforated colitis with retroperitoneal abscess -Underwent drainage of the abscess, right colectomy as well as ileostomy per general surgery -NG tube discontinued; advance to regular diet -Stool noted in ostomy bag -Continue Zosyn -Follow-up pathology report  Hypertension -Blood pressure is soft -Hold lisinopril  Anxiety -Lexapro on hold  Elevated troponin during previous admission -Recently was seen by cardiology, underwent cardiac catheterization on 07/23/2023 with normal coronaries  Medications     acetaminophen  1,000 mg Oral Q6H   enoxaparin (LOVENOX) injection  40 mg Subcutaneous Q24H   feeding supplement  237 mL Oral BID BM   methocarbamol  500 mg Oral Q6H     Data Reviewed:   CBG:  No results for input(s): "GLUCAP" in the last 168 hours.  SpO2: 98 % O2 Flow Rate (L/min): 2 L/min FiO2 (%):  (not sure what generated a MEWS notification; pt has been green while in my care)    Vitals:   07/27/23 1615 07/27/23 1943 07/27/23 2305 07/28/23 0323  BP:  132/88 116/76 104/77  Pulse: (!) 110   90  Resp: (!) 26   (!) 23  Temp:  98.9 F (37.2 C) 99 F  (37.2 C) 98.3 F (36.8 C)  TempSrc:  Oral Oral Oral  SpO2: 90%   98%      Data Reviewed:  Basic Metabolic Panel: Recent Labs  Lab 07/22/23 0112 07/22/23 2324 07/25/23 0512 07/25/23 2036 07/26/23 0252 07/27/23 0116  NA 132* 133* 132*  --  133* 132*  K 3.6 3.2* 4.0  --  4.4 4.7  CL 105 102 100  --  103 101  CO2 19* 21* 21*  --  23 23  GLUCOSE 93 100* 139*  --  224* 140*  BUN 14 10 10   --  6 7  CREATININE 0.84 0.81 0.74 0.68 0.68 0.74  CALCIUM 7.5* 7.9* 8.5*  --  8.1* 7.9*  MG 2.3 2.0  --   --   --   --     CBC: Recent Labs  Lab 07/22/23 2324 07/25/23 0512 07/25/23 2036 07/26/23 0252 07/27/23 0116  WBC 10.3 10.9* 13.3* 14.0* 12.2*  HGB 11.5* 14.0 11.7* 10.8* 10.7*  HCT 34.3* 41.6 36.5* 33.0* 31.9*  MCV 88.9 89.7 91.0 90.4 89.4  PLT 235 339 309 292 305    LFT Recent Labs  Lab 07/25/23 0512 07/26/23 0252  AST 27 21  ALT 38 31  ALKPHOS 80 47  BILITOT 0.5 0.5  PROT 7.5 6.0*  ALBUMIN 2.6* 2.5*     Antibiotics: Anti-infectives (From admission, onward)    Start     Dose/Rate Route Frequency Ordered Stop   07/26/23 0030  piperacillin-tazobactam (ZOSYN) IVPB 3.375 g  3.375 g 12.5 mL/hr over 240 Minutes Intravenous Every 8 hours 07/26/23 0019     07/25/23 1400  piperacillin-tazobactam (ZOSYN) IVPB 3.375 g  Status:  Discontinued        3.375 g 12.5 mL/hr over 240 Minutes Intravenous Every 8 hours 07/25/23 1207 07/26/23 0019   07/25/23 1100  piperacillin-tazobactam (ZOSYN) IVPB 3.375 g        3.375 g 100 mL/hr over 30 Minutes Intravenous  Once 07/25/23 1051 07/25/23 1204        DVT prophylaxis: Lovenox  Code Status: Full code  Family Communication: No family at bedside   CONSULTS General surgery   Subjective   Denies pain, tolerating full liquid diet.  Objective    Physical Examination:   General-appears in no acute distress Heart-S1-S2, regular, no murmur auscultated Lungs-clear to auscultation bilaterally, no wheezing or  crackles auscultated Abdomen-soft, mild tenderness to palpation in right lower quadrant,  no organomegaly, ileostomy in place Extremities-no edema in the lower extremities Neuro-alert, oriented x3, no focal deficit noted  Status is: Inpatient:          Meredeth Ide   [facility] If 7PM-7AM, please contact night-coverage at [email or web address], Office  [phone]   07/28/2023, 8:28 AM  LOS: 3 days

## 2023-07-28 NOTE — Progress Notes (Signed)
During dressing change, pt's midline incision is a grayish/pink color with green/black center. The wound also has an odor. PA, Barnetta Chapel, was notified and is aware of necrosis.  Robina Ade, RN

## 2023-07-29 MED ORDER — DAKINS (1/4 STRENGTH) 0.125 % EX SOLN
Freq: Two times a day (BID) | CUTANEOUS | Status: AC
  Administered 2023-07-29: 1
  Filled 2023-07-29: qty 473

## 2023-07-29 NOTE — Progress Notes (Signed)
MEWS Progress Note  Patient Details Name: Carl Briggs MRN: [id number] DOB: [birth year] Today's Date: 07/29/2023   MEWS Flowsheet Documentation:  Assess: MEWS Score Temp: 98.8 F (37.1 C) BP: 114/84 MAP (mmHg): 94 Pulse Rate: (!) 116 ECG Heart Rate: (!) 118 Resp: 19 Level of Consciousness: Alert SpO2: 95 % O2 Device: Room Air O2 Flow Rate (L/min): 2 L/min FiO2 (%):  (not sure what generated a MEWS notification; pt has been green while in my care) Assess: MEWS Score MEWS Temp: 0 MEWS Systolic: 0 MEWS Pulse: 2 MEWS RR: 0 MEWS LOC: 0 MEWS Score: 2 MEWS Score Color: Yellow Assess: SIRS CRITERIA SIRS Temperature : 0 SIRS Respirations : 0 SIRS Pulse: 1 SIRS WBC: 0 SIRS Score Sum : 1 SIRS Temperature : 0 SIRS Pulse: 1 SIRS Respirations : 0 SIRS WBC: 0 SIRS Score Sum : 1 Assess: if the MEWS score is Yellow or Red Were vital signs accurate and taken at a resting state?: Yes Does the patient meet 2 or more of the SIRS criteria?: Yes Does the patient have a confirmed or suspected source of infection?: Yes MEWS guidelines implemented : Yes, yellow Treat MEWS Interventions: Considered administering scheduled or prn medications/treatments as ordered Take Vital Signs Increase Vital Sign Frequency : Yellow: Q2hr x1, continue Q4hrs until patient remains green for 12hrs Escalate MEWS: Escalate: Yellow: Discuss with charge nurse and consider notifying provider and/or RRT    Pt HR and RR increased to mobilization.       Jeralene Peters 07/29/2023, 2:12 PM

## 2023-07-29 NOTE — Progress Notes (Addendum)
[facility]  PROGRESS NOTE  Carl Briggs QIH:[id number] DOB: [birth year] DOA: 07/25/2023 PCP: Patient, No Pcp Per   Brief HPI:    52 y.o. male, with history of hypertension, hyperlipidemia, anxiety disorder who was just discharged from the hospital on 07/24/2023 after he was treated for sepsis due to colitis.  Patient was admitted with inflammatory terminal ileitis/ascending colitis, started on Zosyn and then discharged on p.o. Augmentin.  . Patient says due to worsening pain he came back to ED. In the ED CT scan of the abdomen/pelvis showed contained perforation in the ascending colon, no widespread free air. General surgery was consulted, and patient went to OR for right sided colectomy and  ileostomy.    Assessment/Plan:   S/p right colectomy, ileostomy, drainage of retroperitoneal abscess -Presented with perforated colitis with retroperitoneal abscess -Underwent drainage of the abscess, right colectomy as well as ileostomy per general surgery -NG tube discontinued; advanced to regular diet -Stool noted in ostomy bag -Culture from the abscess growing E. coli, continue Zosyn -Follow-up pathology report  Hypertension -Blood pressure is soft -Hold lisinopril  Anxiety -Lexapro on hold  Elevated troponin during previous admission -Recently was seen by cardiology, underwent cardiac catheterization on 07/23/2023 with normal coronaries  Medications     acetaminophen  1,000 mg Oral Q6H   enoxaparin (LOVENOX) injection  40 mg Subcutaneous Q24H   feeding supplement  237 mL Oral BID BM   methocarbamol  500 mg Oral Q6H     Data Reviewed:   CBG:  No results for input(s): "GLUCAP" in the last 168 hours.  SpO2: 95 % O2 Flow Rate (L/min): 2 L/min FiO2 (%):  (not sure what generated a MEWS notification; pt has been green while in my care)    Vitals:   07/28/23 2000 07/28/23 2309 07/29/23 0306 07/29/23 0739  BP:  110/77 120/78 114/78  Pulse:  (!) 124 (!) 126 94   Resp: 20 20 20  (!) 25  Temp: 98.4 F (36.9 C)  99.2 F (37.3 C) 97.9 F (36.6 C)  TempSrc: Oral  Oral Oral  SpO2:  93% 91% 95%      Data Reviewed:  Basic Metabolic Panel: Recent Labs  Lab 07/22/23 2324 07/25/23 0512 07/25/23 2036 07/26/23 0252 07/27/23 0116  NA 133* 132*  --  133* 132*  K 3.2* 4.0  --  4.4 4.7  CL 102 100  --  103 101  CO2 21* 21*  --  23 23  GLUCOSE 100* 139*  --  224* 140*  BUN 10 10  --  6 7  CREATININE 0.81 0.74 0.68 0.68 0.74  CALCIUM 7.9* 8.5*  --  8.1* 7.9*  MG 2.0  --   --   --   --     CBC: Recent Labs  Lab 07/22/23 2324 07/25/23 0512 07/25/23 2036 07/26/23 0252 07/27/23 0116  WBC 10.3 10.9* 13.3* 14.0* 12.2*  HGB 11.5* 14.0 11.7* 10.8* 10.7*  HCT 34.3* 41.6 36.5* 33.0* 31.9*  MCV 88.9 89.7 91.0 90.4 89.4  PLT 235 339 309 292 305    LFT Recent Labs  Lab 07/25/23 0512 07/26/23 0252  AST 27 21  ALT 38 31  ALKPHOS 80 47  BILITOT 0.5 0.5  PROT 7.5 6.0*  ALBUMIN 2.6* 2.5*     Antibiotics: Anti-infectives (From admission, onward)    Start     Dose/Rate Route Frequency Ordered Stop   07/26/23 0030  piperacillin-tazobactam (ZOSYN) IVPB 3.375 g  3.375 g 12.5 mL/hr over 240 Minutes Intravenous Every 8 hours 07/26/23 0019     07/25/23 1400  piperacillin-tazobactam (ZOSYN) IVPB 3.375 g  Status:  Discontinued        3.375 g 12.5 mL/hr over 240 Minutes Intravenous Every 8 hours 07/25/23 1207 07/26/23 0019   07/25/23 1100  piperacillin-tazobactam (ZOSYN) IVPB 3.375 g        3.375 g 100 mL/hr over 30 Minutes Intravenous  Once 07/25/23 1051 07/25/23 1204        DVT prophylaxis: Lovenox  Code Status: Full code  Family Communication: No family at bedside   CONSULTS General surgery   Subjective   Denies pain.  Stool in ileostomy bag  Objective    Physical Examination:   Appears in no acute distress S1-S2, regular Lungs clear to auscultation bilaterally Abdomen is soft, nontender, no organomegaly,  ileostomy bag in place  Status is: Inpatient:          Meredeth Ide   [facility] If 7PM-7AM, please contact night-coverage at [email or web address], Office  [phone]   07/29/2023, 8:51 AM  LOS: 4 days

## 2023-07-29 NOTE — Progress Notes (Signed)
[facility] Progress Note  4 Days Post-Op  Subjective: Tolerated a regular diet without nausea or pain. + ileostomy output.    Midline incision with necrotic base and minimal drainage.  Remains AF and HDS. WBC downtrending.   Objective: Vital signs in last 24 hours: Temp:  [97.9 F (36.6 C)-99.2 F (37.3 C)] 97.9 F (36.6 C) (08/06 0739) Pulse Rate:  [93-126] 94 (08/06 0739) Resp:  [20-25] 25 (08/06 0739) BP: (107-120)/(63-86) 114/78 (08/06 0739) SpO2:  [91 %-95 %] 95 % (08/06 0739) Last BM Date : 07/28/23  Intake/Output from previous day: 08/05 0701 - 08/06 0700 In: 1964.3 [P.O.:240; I.V.:1534.9; IV Piggyback:189.4] Out: 2220 [Urine:1550; Drains:20; Stool:650] Intake/Output this shift: No intake/output data recorded.  PE:  Gen:  Alert, NAD, pleasant Card:  RRR Pulm:  rate and effort normal  Abd: Soft, ND, appropriately tender, open midline with necrotic base and minimal drainage.  Fascia intact. JP serosanguinous, ostomy viable with thin dark stool and air in pouch  Lab Results:  Recent Labs    07/27/23 0116  WBC 12.2*  HGB 10.7*  HCT 31.9*  PLT 305   BMET Recent Labs    07/27/23 0116  NA 132*  K 4.7  CL 101  CO2 23  GLUCOSE 140*  BUN 7  CREATININE 0.74  CALCIUM 7.9*   PT/INR No results for input(s): "LABPROT", "INR" in the last 72 hours. CMP     Component Value Date/Time   NA 132 (L) 07/27/2023 0116   NA 141 12/14/2018 1545   K 4.7 07/27/2023 0116   CL 101 07/27/2023 0116   CO2 23 07/27/2023 0116   GLUCOSE 140 (H) 07/27/2023 0116   BUN 7 07/27/2023 0116   BUN 14 12/14/2018 1545   CREATININE 0.74 07/27/2023 0116   CALCIUM 7.9 (L) 07/27/2023 0116   PROT 6.0 (L) 07/26/2023 0252   PROT 7.3 12/14/2018 1545   ALBUMIN 2.5 (L) 07/26/2023 0252   ALBUMIN 4.1 12/14/2018 1545   AST 21 07/26/2023 0252   ALT 31 07/26/2023 0252   ALKPHOS 47 07/26/2023 0252   BILITOT 0.5 07/26/2023 0252   BILITOT 0.4 12/14/2018 1545   GFRNONAA >60  07/27/2023 0116   GFRAA 118 12/14/2018 1545   Lipase     Component Value Date/Time   LIPASE 34 07/25/2023 0512       Studies/Results: No results found.  Anti-infectives: Anti-infectives (From admission, onward)    Start     Dose/Rate Route Frequency Ordered Stop   07/26/23 0030  piperacillin-tazobactam (ZOSYN) IVPB 3.375 g        3.375 g 12.5 mL/hr over 240 Minutes Intravenous Every 8 hours 07/26/23 0019     07/25/23 1400  piperacillin-tazobactam (ZOSYN) IVPB 3.375 g  Status:  Discontinued        3.375 g 12.5 mL/hr over 240 Minutes Intravenous Every 8 hours 07/25/23 1207 07/26/23 0019   07/25/23 1100  piperacillin-tazobactam (ZOSYN) IVPB 3.375 g        3.375 g 100 mL/hr over 30 Minutes Intravenous  Once 07/25/23 1051 07/25/23 1204        Assessment/Plan POD#3 s/p RIGHT COLECTOMY, DRAINAGE RETROPERITONEAL ABSCESS, 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 Janee Morn for perforated colitis with RTP abscess - path pending - continue regular diet - oral pain medication regimen - mobilize, IS, discussed ambulating around the unit - will transition midline wound to [place] WTD BID - WOC c/s for new ostomy. Will need ostomy clinic referral upon discharge - continue JP - currently serosanguinous - continue  abx - will write for [facility], but unclear he will get it as he is uninsured   ID - zosyn 8/2>>, monitor midline wound FEN - IVF, regular diet VTE - SCDs, lovenox Foley - out 8/3 and voiding   Uncontrolled HTN - per medicine Recent admit for elevated troponin, felt likely demand ischemia - left heart cath negative for CAD    LOS: 4 days    [facility] 07/29/2023, 8:47 AM Please see Amion for pager number during day hours 7:00am-4:30pm

## 2023-07-29 NOTE — Discharge Instructions (Addendum)
[facility]      [facility], [place] [phone]  CIRUGA ABDOMINAL ABIERTA: INSTRUCCIONES POSTOP  Siempre revise la hoja de instrucciones de alta que le entreg el centro donde se realiz la ciruga.  SI TIENE FORMULARIOS DE DISCAPACIDAD O LICENCIA FAMILIAR, DEBE TRAERLOS A LA OFICINA PARA SU PROCESAMIENTO. POR FAVOR NO SE LOS D A SU MDICO.  1. Es posible que le den una receta para analgsicos cuando le den de alta. Tome su medicamento para el dolor segn lo recetado, si es necesario. Si no necesita analgsicos narcticos, puede tomar acetaminofeno (Tylenol) o ibuprofeno (Advil), segn sea necesario. 2. [employer] medicamentos recetados habitualmente, a menos que se le indique lo contrario. 3. Si necesita un resurtido de su medicamento para el dolor, comunquese con su farmacia. Se pondrn en contacto con nuestra oficina para solicitar autorizacin. Las recetas no se surtirn despus de las 5 p. m. ni los fines de [place]. 4. Debe seguir una dieta ligera los primeros das despus de llegar a casa, como sopa y [place] saladas, budn, etc., a menos que su mdico le indique lo contrario. Se puede reanudar una dieta alta en fibra y baja en grasas segn se tolere. Asegrese de incluir muchos lquidos diariamente. La mayora de los pacientes experimentarn algo de hinchazn y moretones en el rea del pecho y el cuello. Las bolsas de hielo ayudarn. La hinchazn y los moretones pueden tardar [employer]. 5. La mayora de los pacientes experimentarn algo de hinchazn y moretones en el rea de la incisin. La bolsa de hielo ayudar. La hinchazn y los moretones pueden tardar [employer]. 6. Es comn experimentar algo de estreimiento si se toman analgsicos despus de la [place]. Aumentar la ingesta de lquidos y tomar un ablandador de heces generalmente ayudar o evitar que ocurra este problema. Se debe tomar un laxante suave (leche de magnesia o Miralax) de acuerdo con las  instrucciones del paquete si no hay deposiciones despus de 48 horas. 7. Se le quitarn las suturas o las grapas en el consultorio durante su visita de seguimiento. Es posible que descubra que un vendaje de gasa liviana sobre la incisin puede evitar que se froten o tiren de las grapas. Puede ducharse y reemplazar el vendaje diariamente. 1. ACTIVIDADES: puede reanudar las actividades diarias regulares (livianas) a partir del da siguiente, como el cuidado personal diario, caminar, subir escaleras, aumentando gradualmente las actividades segn lo tolere. Puede tener relaciones sexuales cuando le resulte cmodo. Abstngase de levantar objetos pesados o esforzarse hasta que su mdico lo apruebe. Neomia Dear. Puede conducir cuando ya no est tomando analgsicos recetados, puede usar cmodamente el cinturn de seguridad y Geophysical data processor su automvil y Contractor los frenos de [place] segura. 2. Debe ver a su mdico en el consultorio para una cita de seguimiento [employer] despus de la [place]. Asegrese de llamar para esta cita dentro de uno o [employer] de llegar a casa para asegurar una hora de cita conveniente.  CUNDO LLAMAR A SU MDICO: 1. Fiebre superior a 101,0 2. Incapacidad para orinar 3. Nuseas y/o vmitos 4. Hinchazn extrema o hematomas 5. Sangrado continuo de la incisin. 6. Aumento del dolor, enrojecimiento o drenaje de la incisin. 7. Dificultad para tragar o respirar 8. Calambres o espasmos musculares. 9. Entumecimiento u hormigueo en manos o pies o alrededor [employer].  El personal de la clnica est disponible para responder a sus preguntas durante el horario comercial habitual. No dude en llamar y pedir  hablar con una de las enfermeras si tiene alguna inquietud.  Si tiene ms preguntas, visite [email or web address]   CUIDADO DE HERIDAS: - el vendaje de la lnea media debe cambiarse tres veces al da - suministros: solucin salina esterilizada, gasa,  tijeras, cinta adhesiva  - retire el apsito y todo el apsito con cuidado, humedecindolos con solucin salina estril segn sea necesario para evitar que el apsito o el apsito interno se pegue a la herida. - limpie los bordes de la piel alrededor de la herida con agua o gasa, asegurndose de que no queden restos de cinta ni fugas en la piel que puedan causar irritacin o rotura de la piel. - humedezca y limpie una gasa con solucin salina estril y empaquete la herida desde la base de la herida [employer] nivel de la piel, asegurndose de tomar nota de las posibles reas de recorrido, Technical sales engineer y Control and instrumentation engineer de la herida de [place]. La herida se puede empaquetar sin apretar. Recorte la gasa al tamao adecuado si no necesita una gasa entera. Baruch Gouty la herida con una gasa seca y asegrela con [employer].  - escriba la fecha/hora en el apsito/cinta seca para realizar un mejor seguimiento de cundo se produjo el ltimo cambio de apsito. - cambie el apsito segn sea necesario si se produce una fuga, la herida se contamina o el paciente solicita ducharse. - el paciente puede ducharse diariamente con la herida abierta (es decir, quitar todo el vendaje) y despus de la ducha se debe secar la herida y Scientific laboratory technician un apsito limpio.

## 2023-07-29 NOTE — TOC Initial Note (Signed)
Transition of Care (TOC) - Initial/Assessment Note  Donn Pierini RN,BSN Transitions of Care Unit 4NP (Non Trauma)- RN Case Manager See Treatment Team for direct Phone #   Patient Details  Name: Carl Briggs MRN: [id number] Date of Birth: Apr 24, 1971  Transition of Care [facility]) CM/SW Contact:    Darrold Span, RN Phone Number: 07/29/2023, 2:29 PM  Clinical Narrative:                 Pt from home w/ wife, order placed for [facility], pt is un-insured and will need charity referral.  Noted recs for DME-RW/BSC- CM will also follow up prior to discharge regarding DME needs.   Call made to [facility] (agency covering for charity needs this week)- spoke with liaison for [facility] needs- liaison to follow up to see if pt meets eligibility and see if agency has staffing to meet needs. Await confirmation.   CM also noted pt does not have PCP, will see about finding f/u appointment for primary care needs.   TOC to continue to follow  Expected Discharge Plan: Home w Home Health Services Barriers to Discharge: Continued Medical Work up   Patient Goals and CMS Choice Patient states their goals for this hospitalization and ongoing recovery are:: return home CMS Medicare.gov Compare Post Acute Care list provided to::  (charity referral- no insurance) Choice offered to / list presented to : NA      Expected Discharge Plan and Services   Discharge Planning Services: CM Consult Post Acute Care Choice: Durable Medical Equipment, Home Health Living arrangements for the past 2 months: Mobile Home                 DME Arranged: Bedside commode, Walker rolling         HH Arranged: RN HH Agency: [facility] Date [facility] Agency Contacted: 07/29/23 Time HH Agency Contacted: 1429 Representative spoke with at [facility] Agency: Tresa Endo  Prior Living Arrangements/Services Living arrangements for the past 2 months: Mobile Home Lives with:: Spouse Patient language and need for interpreter  reviewed:: Yes Do you feel safe going back to the place where you live?: Yes      Need for Family Participation in Patient Care: Yes (Comment) Care giver support system in place?: Yes (comment)   Criminal Activity/Legal Involvement Pertinent to Current Situation/Hospitalization: No - Comment as needed  Activities of Daily Living      Permission Sought/Granted                  Emotional Assessment Appearance:: Appears stated age     Orientation: : Oriented to Self, Oriented to Place, Oriented to  Time, Oriented to Situation Alcohol / Substance Use: Not Applicable Psych Involvement: No (comment)  Admission diagnosis:  Colitis [K52.9] Bowel perforation (HCC) [K63.1] Pneumoperitoneum [K66.8] Patient Active Problem List   Diagnosis Date Noted   Bowel perforation (HCC) 07/25/2023   Pneumoperitoneum 07/25/2023   Colitis 07/22/2023   Elevated troponin 07/20/2023   Chest pain 08/11/2021   SIRS (systemic inflammatory response syndrome) (HCC) 08/11/2021   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   Metabolic acidosis with increased anion gap and accumulation of organic acids 08/11/2021   Hyponatremia 08/11/2021   Hyperbilirubinemia 08/11/2021   Elevated AST (SGOT) 08/11/2021   Dyspnea 08/11/2021   Low back pain 10/03/2020   Rectal bleeding    GI bleed 11/07/2018   Acute posthemorrhagic anemia 11/07/2018   GI bleeding 11/07/2018   PCP:  Patient, No Pcp Per Pharmacy:   [id number] [place], [place] - [address] AT [facility] OF [address] [place] [address] Phone: [phone] Fax: [phone]     Social Determinants of Health (SDOH) Social History: SDOH Screenings   Food Insecurity: No Food Insecurity (07/20/2023)  Housing: Low Risk  (07/20/2023)  Transportation Needs: No Transportation Needs (07/20/2023)  Utilities: Not At Risk (07/20/2023)  Depression (PHQ2-9): Low Risk  (10/19/2019)  Tobacco Use: Low Risk   (07/25/2023)   SDOH Interventions:     Readmission Risk Interventions     No data to display

## 2023-07-30 ENCOUNTER — Inpatient Hospital Stay (HOSPITAL_COMMUNITY): Payer: Self-pay

## 2023-07-30 MED ORDER — IOHEXOL 9 MG/ML PO SOLN
500.0000 mL | ORAL | Status: AC
  Administered 2023-07-30 (×2): 500 mL via ORAL

## 2023-07-30 MED ORDER — IOHEXOL 350 MG/ML SOLN
75.0000 mL | Freq: Once | INTRAVENOUS | Status: AC | PRN
  Administered 2023-07-30: 75 mL via INTRAVENOUS

## 2023-07-30 MED ORDER — SODIUM CHLORIDE 0.9 % IV SOLN: INTRAVENOUS | Status: DC

## 2023-07-30 NOTE — Plan of Care (Signed)
IR was requested for image guided drain placement.   Case was reviewed by Dr. Bryn Gulling, the collection is too central and there is no window for percutaneous drain at this moment.  Recommends conservative management and possible repeat imaging if patient condition does not improve.    Ordering provider notified.   Will delete the IR rad eval  order.  Please call IR for questions and concerns.   Lynann Bologna Sandro Burgo PA-C 07/30/2023 3:52 PM

## 2023-07-30 NOTE — Plan of Care (Signed)
Problem: Education: Goal: Understanding of CV disease, CV risk reduction, and recovery process will improve 07/30/2023 0422 by Carl Coria, RN Outcome: Progressing 07/29/2023 2014 by Carl Coria, RN Outcome: Progressing   Problem: Education: Goal: Individualized Educational Video(s) 07/30/2023 0422 by Carl Coria, RN Outcome: Progressing 07/29/2023 2014 by Carl Coria, RN Outcome: Progressing   Problem: Education: Goal: Understanding of CV disease, CV risk reduction, and recovery process will improve 07/30/2023 0422 by Carl Coria, RN Outcome: Progressing 07/29/2023 2014 by Carl Coria, RN Outcome: Progressing Goal: Individualized Educational Video(s) 07/30/2023 0422 by Carl Coria, RN Outcome: Progressing 07/29/2023 2014 by Carl Coria, RN Outcome: Progressing   Problem: Activity: Goal: Ability to return to baseline activity level will improve 07/30/2023 0422 by Carl Coria, RN Outcome: Progressing 07/29/2023 2014 by Carl Coria, RN Outcome: Progressing   Problem: Cardiovascular: Goal: Ability to achieve and maintain adequate cardiovascular perfusion will improve 07/30/2023 0422 by Carl Coria, RN Outcome: Progressing 07/29/2023 2014 by Carl Coria, RN Outcome: Progressing Goal: Vascular access site(s) Level 0-1 will be maintained 07/30/2023 0422 by Carl Coria, RN Outcome: Progressing 07/29/2023 2014 by Carl Coria, RN Outcome: Progressing   Problem: Health Behavior/Discharge Planning: Goal: Ability to safely manage health-related needs after discharge will improve 07/30/2023 0422 by Carl Coria, RN Outcome: Progressing 07/29/2023 2014 by Carl Coria, RN Outcome: Progressing   Problem: Education: Goal: Knowledge of General Education information will improve Description: Including pain rating scale, medication(s)/side effects and non-pharmacologic comfort measures 07/30/2023 0422 by  Carl Coria, RN Outcome: Progressing 07/29/2023 2014 by Carl Coria, RN Outcome: Progressing   Problem: Health Behavior/Discharge Planning: Goal: Ability to manage health-related needs will improve 07/30/2023 0422 by Carl Coria, RN Outcome: Progressing 07/29/2023 2014 by Carl Coria, RN Outcome: Progressing   Problem: Clinical Measurements: Goal: Ability to maintain clinical measurements within normal limits will improve 07/30/2023 0422 by Carl Coria, RN Outcome: Progressing 07/29/2023 2014 by Carl Coria, RN Outcome: Progressing Goal: Will remain free from infection 07/30/2023 0422 by Carl Coria, RN Outcome: Progressing 07/29/2023 2014 by Carl Coria, RN Outcome: Progressing Goal: Diagnostic test results will improve 07/30/2023 0422 by Carl Coria, RN Outcome: Progressing 07/29/2023 2014 by Carl Coria, RN Outcome: Progressing Goal: Respiratory complications will improve 07/30/2023 0422 by Carl Coria, RN Outcome: Progressing 07/29/2023 2014 by Carl Coria, RN Outcome: Progressing Goal: Cardiovascular complication will be avoided 07/30/2023 0422 by Carl Coria, RN Outcome: Progressing 07/29/2023 2014 by Carl Coria, RN Outcome: Progressing   Problem: Activity: Goal: Risk for activity intolerance will decrease 07/30/2023 0422 by Carl Coria, RN Outcome: Progressing 07/29/2023 2014 by Carl Coria, RN Outcome: Progressing   Problem: Nutrition: Goal: Adequate nutrition will be maintained 07/30/2023 0422 by Carl Coria, RN Outcome: Progressing 07/29/2023 2014 by Carl Coria, RN Outcome: Progressing   Problem: Coping: Goal: Level of anxiety will decrease 07/30/2023 0422 by Carl Coria, RN Outcome: Progressing 07/29/2023 2014 by Carl Coria, RN Outcome: Progressing   Problem: Elimination: Goal: Will not experience complications related to bowel  motility 07/30/2023 0422 by Carl Coria, RN Outcome: Progressing 07/29/2023 2014 by Carl Coria, RN Outcome: Progressing Goal: Will not experience complications related to urinary retention 07/30/2023 0422 by Carl Coria, RN Outcome: Progressing 07/29/2023 2014 by Carl Coria, RN Outcome: Progressing   Problem: Pain Managment: Goal: General experience of comfort  will improve 07/30/2023 0422 by Carl Coria, RN Outcome: Progressing 07/29/2023 2014 by Carl Coria, RN Outcome: Progressing   Problem: Safety: Goal: Ability to remain free from injury will improve 07/30/2023 0422 by Carl Coria, RN Outcome: Progressing 07/29/2023 2014 by Carl Coria, RN Outcome: Progressing   Problem: Skin Integrity: Goal: Risk for impaired skin integrity will decrease 07/30/2023 0422 by Carl Coria, RN Outcome: Progressing 07/29/2023 2014 by Carl Coria, RN Outcome: Progressing

## 2023-07-30 NOTE — Progress Notes (Signed)
[facility] Progress Note  5 Days Post-Op  Subjective: WBC 16 from 10. Midline incision with increased drainage.  He is tolerating a diet and denies new complaints.     Objective: Vital signs in last 24 hours: Temp:  [98.5 F (36.9 C)-99.7 F (37.6 C)] 98.5 F (36.9 C) (08/07 0748) Pulse Rate:  [95-116] 95 (08/07 0748) Resp:  [15-31] 24 (08/07 0748) BP: (105-120)/(70-84) 120/83 (08/07 0748) SpO2:  [90 %-95 %] 94 % (08/07 0748) Last BM Date : 07/29/23  Intake/Output from previous day: 08/06 0701 - 08/07 0700 In: 1767.4 [P.O.:360; I.V.:1270; IV Piggyback:137.4] Out: 510 [Urine:200; Drains:10; Stool:300] Intake/Output this shift: No intake/output data recorded.  PE:  Gen:  Alert, NAD, pleasant Card:  RRR Pulm:  rate and effort normal  Abd: Soft, ND, appropriately tender, open midline with necrotic base moderate drainage.  Fascia intact. JP serosanguinous, ostomy viable thick stool in the bag  Lab Results:  Recent Labs    07/30/23 0126  WBC 16.3*  HGB 11.6*  HCT 34.4*  PLT 528*   BMET Recent Labs    07/30/23 0126  NA 125*  K 4.7  CL 94*  CO2 20*  GLUCOSE 110*  BUN 8  CREATININE 0.66  CALCIUM 7.7*   PT/INR No results for input(s): "LABPROT", "INR" in the last 72 hours. CMP     Component Value Date/Time   NA 125 (L) 07/30/2023 0126   NA 141 12/14/2018 1545   K 4.7 07/30/2023 0126   CL 94 (L) 07/30/2023 0126   CO2 20 (L) 07/30/2023 0126   GLUCOSE 110 (H) 07/30/2023 0126   BUN 8 07/30/2023 0126   BUN 14 12/14/2018 1545   CREATININE 0.66 07/30/2023 0126   CALCIUM 7.7 (L) 07/30/2023 0126   PROT 6.2 (L) 07/30/2023 0126   PROT 7.3 12/14/2018 1545   ALBUMIN 2.1 (L) 07/30/2023 0126   ALBUMIN 4.1 12/14/2018 1545   AST 21 07/30/2023 0126   ALT 28 07/30/2023 0126   ALKPHOS 76 07/30/2023 0126   BILITOT 0.7 07/30/2023 0126   BILITOT 0.4 12/14/2018 1545   GFRNONAA >60 07/30/2023 0126   GFRAA 118 12/14/2018 1545   Lipase     Component Value  Date/Time   LIPASE 34 07/25/2023 0512       Studies/Results: No results found.  Anti-infectives: Anti-infectives (From admission, onward)    Start     Dose/Rate Route Frequency Ordered Stop   07/26/23 0030  piperacillin-tazobactam (ZOSYN) IVPB 3.375 g        3.375 g 12.5 mL/hr over 240 Minutes Intravenous Every 8 hours 07/26/23 0019     07/25/23 1400  piperacillin-tazobactam (ZOSYN) IVPB 3.375 g  Status:  Discontinued        3.375 g 12.5 mL/hr over 240 Minutes Intravenous Every 8 hours 07/25/23 1207 07/26/23 0019   07/25/23 1100  piperacillin-tazobactam (ZOSYN) IVPB 3.375 g        3.375 g 100 mL/hr over 30 Minutes Intravenous  Once 07/25/23 1051 07/25/23 1204        Assessment/Plan POD#5 s/p RIGHT COLECTOMY, DRAINAGE RETROPERITONEAL ABSCESS, 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 Janee Morn for perforated colitis with RTP abscess - CT A/P (ordered) - NPO for now pending results of CT - Continue Dakins BID to midline  - WOC c/s for new ostomy. Will need ostomy clinic referral upon discharge - continue JP - currently serosanguinous - continue abx - will write for [facility], but unclear he will get it as he is uninsured   ID -  zosyn 8/2>>, pending CT FEN - IVF, regular diet VTE - SCDs, lovenox Foley - out 8/3 and voiding   Uncontrolled HTN - per medicine Recent admit for elevated troponin, felt likely demand ischemia - left heart cath negative for CAD    LOS: 5 days    [facility] 07/30/2023, 8:12 AM Please see Amion for pager number during day hours 7:00am-4:30pm

## 2023-07-30 NOTE — Consult Note (Signed)
WOC Nurse ostomy follow up Met with patient and his wife with use of electronic Spanish translator services.  Stoma type/location: RLQ, end ileostomy  Stomal assessment/size: 1 5/8" round, budded, pink, moist Peristomal assessment: NA Treatment options for stomal/peristomal skin: WOC was using 2" barrier ring Output: liquid green/yellow  Ostomy pouching: 2pc. 2 3/4"  Education provided:  When I arrived to change pouch/provide education with wife and patient bedside nursing has just changed pouch because of leaking.  Patient was getting up with PT and pouch was leaking and needed to be changed.  Met with patient and his wife after bedside nursing finished caring for patient. Provided her with [facility] customer assistance paperwork and reviewed, wrote down the (3) items he is using. Talked with patient's daughter by cell phone/speaker phone. She speaks [place] and she has taken a picture of the customer assistance information this week and her sister is planning to Control and instrumentation engineer today. Her mother will send a picture to the girls of the items I have written down for them.  Discussed with mother and daughter need to learn to perform wound care and they are asking about supplies, offered that bedside nursing would send home enough for  a few days but they would need to purchase OTC after that.  Enrolled patient in [facility] [employer] Discharge program: Yes  Planned a visit with wife and patient again for Friday 8/9 at 8am to change pouch to allow patient and wife to participate in pouch change.   Daughter has asked that staff allow her father to participate in his care of his ostomy, spoke with bedside nursing and I will add orders for same.   Nikko Quast [facility], CNS, [employer] [phone]

## 2023-07-30 NOTE — Progress Notes (Signed)
PROGRESS NOTE    Cevon Ostergard  ZOX:[id number] DOB: 1970-12-30 DOA: 07/25/2023 PCP: Patient, No Pcp Per   Brief Narrative:  52 y.o. male, with history of hypertension, hyperlipidemia, anxiety disorder who was just discharged from the hospital on 07/24/2023 after he was treated for sepsis due to colitis presented with worsening abdominal pain.  On presentation, CT of the abdomen/pelvis showed contained perforation in the ascending colon.  He was started on IV antibiotic.  General surgery was consulted.  He underwent surgical intervention with right colectomy, drainage of retroperitoneal abscess and ileostomy on 07/25/23 by general surgery.  Assessment & Plan:   Perforated colitis with retroperitoneal abscess - underwent surgical intervention with right colectomy, drainage of retroperitoneal abscess and ileostomy on 07/25/23 by general surgery. -Currently on Zosyn. -Ostomy/wound care/pain management and diet as per general surgery recommendations.  Hyponatremia -Possibly from dehydration and poor oral intake.  Switch IV fluids to normal saline at 100 cc an hour.  Repeat a.m. labs.  Leukocytosis -Worsening.  Follow CT of the abdomen and pelvis as ordered by general surgery -Monitor  Hypertension -Blood pressure on the lower side.  Lisinopril on hold  Anxiety -Lexapro on hold  Elevated troponin during previous admission -Recently was seen by cardiology, underwent cardiac catheterization on 07/23/2023 with normal coronaries   DVT prophylaxis: Lovenox Code Status: Full Family Communication: None at bedside Disposition Plan: Status is: Inpatient Remains inpatient appropriate because: of severity of illness  Consultants: General surgery  Procedures: As above  Antimicrobials:  Anti-infectives (From admission, onward)    Start     Dose/Rate Route Frequency Ordered Stop   07/26/23 0030  piperacillin-tazobactam (ZOSYN) IVPB 3.375 g        3.375 g 12.5 mL/hr over 240 Minutes  Intravenous Every 8 hours 07/26/23 0019     07/25/23 1400  piperacillin-tazobactam (ZOSYN) IVPB 3.375 g  Status:  Discontinued        3.375 g 12.5 mL/hr over 240 Minutes Intravenous Every 8 hours 07/25/23 1207 07/26/23 0019   07/25/23 1100  piperacillin-tazobactam (ZOSYN) IVPB 3.375 g        3.375 g 100 mL/hr over 30 Minutes Intravenous  Once 07/25/23 1051 07/25/23 1204        Subjective: Patient seen and examined at bedside.  Denies worsening of pain, fever or vomiting.  Objective: Vitals:   07/30/23 0000 07/30/23 0252 07/30/23 0748 07/30/23 0800  BP: 114/81 109/81 120/83 118/85  Pulse: (!) 104 (!) 101 95 89  Resp: (!) 26  (!) 24 (!) 26  Temp: 98.8 F (37.1 C) 98.6 F (37 C) 98.5 F (36.9 C)   TempSrc: Oral Oral Oral   SpO2: 90% 91% 94% 93%    Intake/Output Summary (Last 24 hours) at 07/30/2023 1027 Last data filed at 07/30/2023 0400 Gross per 24 hour  Intake 1767.44 ml  Output 510 ml  Net 1257.44 ml   There were no vitals filed for this visit.  Examination:  General exam: Appears calm and comfortable.  On room air. Respiratory system: Bilateral decreased breath sounds at bases scattered crackles and tachypnea Cardiovascular system: S1 & S2 heard, Rate controlled Gastrointestinal system: Abdomen is distended, soft and mild tender.  Normal bowel sounds heard.  Dressing present; JP drainage present; ostomy present Extremities: No cyanosis, clubbing, edema  Central nervous system: Alert and oriented.  Slow to respond.  Poor historian.  No focal neurological deficits. Moving extremities Skin: No rashes, lesions or ulcers Psychiatry: Flat affect.  Not agitated.  Data Reviewed: I have personally reviewed following labs and imaging studies  CBC: Recent Labs  Lab 07/25/23 0512 07/25/23 2036 07/26/23 0252 07/27/23 0116 07/30/23 0126  WBC 10.9* 13.3* 14.0* 12.2* 16.3*  HGB 14.0 11.7* 10.8* 10.7* 11.6*  HCT 41.6 36.5* 33.0* 31.9* 34.4*  MCV 89.7 91.0 90.4 89.4  89.8  PLT 339 309 292 305 528*   Basic Metabolic Panel: Recent Labs  Lab 07/25/23 0512 07/25/23 2036 07/26/23 0252 07/27/23 0116 07/30/23 0126  NA 132*  --  133* 132* 125*  K 4.0  --  4.4 4.7 4.7  CL 100  --  103 101 94*  CO2 21*  --  23 23 20*  GLUCOSE 139*  --  224* 140* 110*  BUN 10  --  6 7 8   CREATININE 0.74 0.68 4.09 0.74 0.66  CALCIUM 8.5*  --  8.1* 7.9* 7.7*   GFR: Estimated Creatinine Clearance: 89.1 mL/min (by C-G formula based on SCr of 0.66 mg/dL). Liver Function Tests: Recent Labs  Lab 07/25/23 0512 07/26/23 0252 07/30/23 0126  AST 27 21 21   ALT 38 31 28  ALKPHOS 80 47 76  BILITOT 0.5 0.5 0.7  PROT 7.5 6.0* 6.2*  ALBUMIN 2.6* 2.5* 2.1*   Recent Labs  Lab 07/25/23 0512  LIPASE 34   No results for input(s): "AMMONIA" in the last 168 hours. Coagulation Profile: No results for input(s): "INR", "PROTIME" in the last 168 hours. Cardiac Enzymes: No results for input(s): "CKTOTAL", "CKMB", "CKMBINDEX", "TROPONINI" in the last 168 hours. BNP (last 3 results) No results for input(s): "PROBNP" in the last 8760 hours. HbA1C: No results for input(s): "HGBA1C" in the last 72 hours. CBG: No results for input(s): "GLUCAP" in the last 168 hours. Lipid Profile: No results for input(s): "CHOL", "HDL", "LDLCALC", "TRIG", "CHOLHDL", "LDLDIRECT" in the last 72 hours. Thyroid Function Tests: No results for input(s): "TSH", "T4TOTAL", "FREET4", "T3FREE", "THYROIDAB" in the last 72 hours. Anemia Panel: No results for input(s): "VITAMINB12", "FOLATE", "FERRITIN", "TIBC", "IRON", "RETICCTPCT" in the last 72 hours. Sepsis Labs: Recent Labs  Lab 07/25/23 1208  LATICACIDVEN 0.5    Recent Results (from the past 240 hour(s))  Culture, blood (Routine X 2) w Reflex to ID Panel     Status: None   Collection Time: 07/22/23  1:58 PM   Specimen: BLOOD RIGHT ARM  Result Value Ref Range Status   Specimen Description BLOOD RIGHT ARM  Final   Special Requests   Final     BOTTLES DRAWN AEROBIC AND ANAEROBIC Blood Culture adequate volume   Culture   Final    NO GROWTH 5 DAYS Performed at [facility], [address] [place], [place] [address]    Report Status 07/27/2023 FINAL  Final  Culture, blood (Routine X 2) w Reflex to ID Panel     Status: None   Collection Time: 07/22/23  1:58 PM   Specimen: BLOOD LEFT HAND  Result Value Ref Range Status   Specimen Description BLOOD LEFT HAND  Final   Special Requests   Final    BOTTLES DRAWN AEROBIC AND ANAEROBIC Blood Culture adequate volume   Culture   Final    NO GROWTH 5 DAYS Performed at [facility], [address], [place], [place] [address]    Report Status 07/27/2023 FINAL  Final  Aerobic/Anaerobic Culture w Gram Stain (surgical/deep wound)     Status: None   Collection Time: 07/25/23  2:01 PM   Specimen: PATH Cytology Peritoneal  fluid; Body Fluid  Result Value Ref Range Status   Specimen Description FLUID PERITONEAL  Final   Special Requests SWABS  Final   Gram Stain   Final    ABUNDANT WBC PRESENT, PREDOMINANTLY PMN ABUNDANT GRAM POSITIVE COCCI ABUNDANT GRAM NEGATIVE RODS RARE GRAM POSITIVE RODS    Culture   Final    RARE ESCHERICHIA COLI MODERATE BACTEROIDES FRAGILIS BETA LACTAMASE POSITIVE Performed at [facility], [address] [place], [place] [address]    Report Status 07/28/2023 FINAL  Final   Organism ID, Bacteria ESCHERICHIA COLI  Final      Susceptibility   Escherichia coli - MIC*    AMPICILLIN 8 SENSITIVE Sensitive     CEFEPIME <=0.12 SENSITIVE Sensitive     CEFTAZIDIME <=1 SENSITIVE Sensitive     CEFTRIAXONE <=0.25 SENSITIVE Sensitive     CIPROFLOXACIN <=0.25 SENSITIVE Sensitive     GENTAMICIN <=1 SENSITIVE Sensitive     IMIPENEM <=0.25 SENSITIVE Sensitive     TRIMETH/SULFA <=20 SENSITIVE Sensitive     AMPICILLIN/SULBACTAM 4 SENSITIVE Sensitive     PIP/TAZO <=4 SENSITIVE Sensitive     * RARE ESCHERICHIA COLI         Radiology Studies: No  results found.      Scheduled Meds:  acetaminophen  1,000 mg Oral Q6H   enoxaparin (LOVENOX) injection  40 mg Subcutaneous Q24H   feeding supplement  237 mL Oral BID BM   methocarbamol  500 mg Oral Q6H   sodium hypochlorite   Irrigation BID   Continuous Infusions:  sodium chloride 10 mL/hr at 07/29/23 1836   dextrose 5 % and 0.45 % NaCl with KCl 20 mEq/L 100 mL/hr at 07/30/23 0916   piperacillin-tazobactam (ZOSYN)  IV 3.375 g (07/30/23 0819)          Glade Lloyd, MD [facility] 07/30/2023, 10:27 AM '

## 2023-07-30 NOTE — Progress Notes (Signed)
Physical Therapy Treatment Patient Details Name: Carl Briggs MRN: [id number] DOB: [birth year] Today's Date: 07/30/2023   History of Present Illness The pt is a 52 yo male presenting 8/2 with abdominal pain, emesis, and diarrhea. Pt found to have perforated ascending colon and is s/p R colectomy with ileostomy on 8/2. Pt with recent admission for sepsis due to colitis and was d/c home on 8/1. PMH includes: HTN, HLD, and anxiety.    PT Comments  Pt progressing towards his physical therapy goals and is agreeable to participate. Ostomy bag leaking at beginning of session and RN present to change pouch. Pt ambulating 150 ft with a walker at a supervision level. Education provided to pt/pt wife regarding activity recommendations. Will continue to follow acutely.    If plan is discharge home, recommend the following: A little help with walking and/or transfers;A little help with bathing/dressing/bathroom;Assistance with cooking/housework;Assist for transportation;Help with stairs or ramp for entrance   Can travel by private vehicle        Equipment Recommendations  Rolling walker (2 wheels);BSC/3in1    Recommendations for Other Services       Precautions / Restrictions Precautions Precautions: Fall Precaution Comments: colostomy Restrictions Weight Bearing Restrictions: No     Mobility  Bed Mobility Overal bed mobility: Needs Assistance Bed Mobility: Rolling, Sidelying to Sit, Sit to Sidelying Rolling: Supervision Sidelying to sit: Supervision     Sit to sidelying: Min assist General bed mobility comments: No physical assist to exit bed, assist for LE elevation back in    Transfers Overall transfer level: Needs assistance Equipment used: Rolling walker (2 wheels) Transfers: Sit to/from Stand Sit to Stand: Contact guard assist                Ambulation/Gait Ambulation/Gait assistance: Supervision Gait Distance (Feet): 150 Feet Assistive device: Rolling walker  (2 wheels) Gait Pattern/deviations: Step-through pattern, Decreased stride length Gait velocity: decreased     General Gait Details: Slow and steady pace, min cues for posture   Stairs             Wheelchair Mobility     Tilt Bed    Modified Rankin (Stroke Patients Only)       Balance Overall balance assessment: Needs assistance Sitting-balance support: No upper extremity supported Sitting balance-Leahy Scale: Good     Standing balance support: Bilateral upper extremity supported, During functional activity Standing balance-Leahy Scale: Fair                              Cognition Arousal: Alert Behavior During Therapy: WFL for tasks assessed/performed Overall Cognitive Status: Within Functional Limits for tasks assessed                                          Exercises      General Comments        Pertinent Vitals/Pain Pain Assessment Pain Assessment: Faces Faces Pain Scale: Hurts little more Pain Location: abdomen Pain Descriptors / Indicators: Burning, Discomfort Pain Intervention(s): Limited activity within patient's tolerance, Monitored during session    Home Living                          Prior Function            PT Goals (current goals can now be  found in the care plan section) Acute Rehab PT Goals Patient Stated Goal: return home PT Goal Formulation: With patient/family Time For Goal Achievement: 08/09/23 Potential to Achieve Goals: Good Progress towards PT goals: Progressing toward goals    Frequency    Min 1X/week      PT Plan Current plan remains appropriate    Co-evaluation              AM-PAC PT "6 Clicks" Mobility   Outcome Measure  Help needed turning from your back to your side while in a flat bed without using bedrails?: A Little Help needed moving from lying on your back to sitting on the side of a flat bed without using bedrails?: A Little Help needed moving to  and from a bed to a chair (including a wheelchair)?: A Little Help needed standing up from a chair using your arms (e.g., wheelchair or bedside chair)?: A Little Help needed to walk in hospital room?: A Little Help needed climbing 3-5 steps with a railing? : A Little 6 Click Score: 18    End of Session Equipment Utilized During Treatment: Gait belt Activity Tolerance: Patient tolerated treatment well Patient left: with call bell/phone within reach;in bed Nurse Communication: Mobility status PT Visit Diagnosis: Other abnormalities of gait and mobility (R26.89);Pain Pain - part of body:  (abdomen)     Time: 7829-5621 PT Time Calculation (min) (ACUTE ONLY): 24 min  Charges:    $Therapeutic Activity: 23-37 mins PT General Charges $$ ACUTE PT VISIT: 1 Visit                     Lillia Pauls, PT, DPT Acute Rehabilitation Services Office [phone]    Norval Morton 07/30/2023, 10:33 AM

## 2023-07-31 MED ORDER — AMOXICILLIN-POT CLAVULANATE 875-125 MG PO TABS
1.0000 | ORAL_TABLET | Freq: Two times a day (BID) | ORAL | Status: DC
  Administered 2023-07-31 – 2023-08-08 (×17): 1 via ORAL
  Filled 2023-07-31 (×18): qty 1

## 2023-07-31 NOTE — Plan of Care (Signed)

## 2023-07-31 NOTE — Progress Notes (Addendum)
[facility] Progress Note  6 Days Post-Op  Subjective: WBC 13 from 16.  Remains AF and HDS.  CT showed a small intra-abdominal collection that is not amenable to percutaneous drainage.    Objective: Vital signs in last 24 hours: Temp:  [98.2 F (36.8 C)-98.7 F (37.1 C)] 98.4 F (36.9 C) (08/08 0757) Pulse Rate:  [84-110] 84 (08/08 0757) Resp:  [20-25] 22 (08/08 0757) BP: (105-131)/(70-85) 112/77 (08/08 0757) SpO2:  [90 %-94 %] 94 % (08/08 0757) Weight:  [65.1 kg] 65.1 kg (08/08 0500) Last BM Date : 07/31/23  Intake/Output from previous day: 08/07 0701 - 08/08 0700 In: 1642.1 [I.V.:1503.3; IV Piggyback:138.8] Out: 3210 [Urine:2800; Drains:10; Stool:400] Intake/Output this shift: Total I/O In: 60 [P.O.:60] Out: 40 [Drains:40]  PE:  Gen:  Alert, NAD, pleasant Card:  RRR Pulm:  rate and effort normal  Abd: Soft, ND, appropriately tender, open midline with necrotic base moderate drainage.  Fascia intact. JP purulent, ostomy viable thick stool in the bag  Lab Results:  Recent Labs    07/30/23 0126 07/31/23 0218  WBC 16.3* 13.2*  HGB 11.6* 11.0*  HCT 34.4* 32.7*  PLT 528* 620*   BMET Recent Labs    07/30/23 0126 07/31/23 0218  NA 125* 132*  K 4.7 4.0  CL 94* 100  CO2 20* 24  GLUCOSE 110* 114*  BUN 8 9  CREATININE 0.66 0.67  CALCIUM 7.7* 7.8*   PT/INR No results for input(s): "LABPROT", "INR" in the last 72 hours. CMP     Component Value Date/Time   NA 132 (L) 07/31/2023 0218   NA 141 12/14/2018 1545   K 4.0 07/31/2023 0218   CL 100 07/31/2023 0218   CO2 24 07/31/2023 0218   GLUCOSE 114 (H) 07/31/2023 0218   BUN 9 07/31/2023 0218   BUN 14 12/14/2018 1545   CREATININE 0.67 07/31/2023 0218   CALCIUM 7.8 (L) 07/31/2023 0218   PROT 6.1 (L) 07/31/2023 0218   PROT 7.3 12/14/2018 1545   ALBUMIN 1.9 (L) 07/31/2023 0218   ALBUMIN 4.1 12/14/2018 1545   AST 26 07/31/2023 0218   ALT 32 07/31/2023 0218   ALKPHOS 79 07/31/2023 0218   BILITOT  0.6 07/31/2023 0218   BILITOT 0.4 12/14/2018 1545   GFRNONAA >60 07/31/2023 0218   GFRAA 118 12/14/2018 1545   Lipase     Component Value Date/Time   LIPASE 34 07/25/2023 0512       Studies/Results: CT ABDOMEN PELVIS W CONTRAST  Result Date: 07/30/2023 CLINICAL DATA:  Postoperative abdominal pain. EXAM: CT ABDOMEN AND PELVIS WITH CONTRAST TECHNIQUE: Multidetector CT imaging of the abdomen and pelvis was performed using the standard protocol following bolus administration of intravenous contrast. RADIATION DOSE REDUCTION: This exam was performed according to the departmental dose-optimization program which includes automated exposure control, adjustment of the mA and/or kV according to patient size and/or use of iterative reconstruction technique. CONTRAST:  75mL OMNIPAQUE IOHEXOL 350 MG/ML SOLN COMPARISON:  July 25, 2023. FINDINGS: Lower chest: Mild bibasilar subsegmental atelectasis is noted. Hepatobiliary: Cholelithiasis. No biliary dilatation is noted. Probable small peripheral right hepatic cyst. Pancreas: Unremarkable. No pancreatic ductal dilatation or surrounding inflammatory changes. Spleen: Normal in size without focal abnormality. Adrenals/Urinary Tract: Adrenal glands appear normal. Right renal cyst is noted for which no further follow-up is required. No hydronephrosis or renal obstruction is noted. Urinary bladder is unremarkable. Stomach/Bowel: Stomach is unremarkable. Status post right hemicolectomy. Surgical drain is seen in right side of abdomen in surgical bed.  Minimal amount of free fluid is noted in this area which most likely is postoperative. Midline surgical wound is noted. No abnormal bowel dilatation is noted. Vascular/Lymphatic: No significant vascular findings are present. No enlarged abdominal or pelvic lymph nodes. Reproductive: Prostate is unremarkable. Other: As noted above, midline surgical wound is noted with small amount of free air seen anteriorly in the midline of  the abdomen consistent with recent postoperative status. 5.2 x 2.0 cm fluid collection with enhancing wall is noted in the pelvis centrally which could represent abscess. Musculoskeletal: No acute or significant osseous findings. IMPRESSION: Status post right hemicolectomy with surgical drain seen in right side of the abdomen and minimal free fluid in this area consistent with expected postoperative change. However, 5.2 x 2.0 cm fluid collection with enhancing wall is noted centrally in the pelvis which could represent abscess. Cholelithiasis. Electronically Signed   By: Lupita Raider M.D.   On: 07/30/2023 13:39    Anti-infectives: Anti-infectives (From admission, onward)    Start     Dose/Rate Route Frequency Ordered Stop   07/26/23 0030  piperacillin-tazobactam (ZOSYN) IVPB 3.375 g        3.375 g 12.5 mL/hr over 240 Minutes Intravenous Every 8 hours 07/26/23 0019     07/25/23 1400  piperacillin-tazobactam (ZOSYN) IVPB 3.375 g  Status:  Discontinued        3.375 g 12.5 mL/hr over 240 Minutes Intravenous Every 8 hours 07/25/23 1207 07/26/23 0019   07/25/23 1100  piperacillin-tazobactam (ZOSYN) IVPB 3.375 g        3.375 g 100 mL/hr over 30 Minutes Intravenous  Once 07/25/23 1051 07/25/23 1204        Assessment/Plan POD#5 s/p RIGHT COLECTOMY, DRAINAGE RETROPERITONEAL ABSCESS, 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 Janee Morn for perforated colitis with RTP abscess - CT 8/7 with a small intra-abdominal collection not amenable to percutaneous drainage - Continue Dakins BID to midline  - Will transition to Augmentin today  - WOC c/s for new ostomy. Will need ostomy clinic referral upon discharge - continue JP  - continue abx - will write for [facility], but unclear he will get it as he is uninsured   ID - zosyn 8/2 - 8/8, Augmentin 8/8 -  FEN - IVF, regular diet VTE - SCDs, lovenox Foley - out 8/3 and voiding   Uncontrolled HTN - per medicine Recent admit for elevated troponin, felt likely demand ischemia - left  heart cath negative for CAD    LOS: 6 days    [facility] 07/31/2023, 8:56 AM Please see Amion for pager number during day hours 7:00am-4:30pm

## 2023-07-31 NOTE — Progress Notes (Signed)
PROGRESS NOTE    Nautica Kreutzer  JYN:[id number] DOB: [birth year] DOA: 07/25/2023 PCP: Patient, No Pcp Per   Brief Narrative:  52 y.o. male, with history of hypertension, hyperlipidemia, anxiety disorder who was just discharged from the hospital on 07/24/2023 after he was treated for sepsis due to colitis presented with worsening abdominal pain.  On presentation, CT of the abdomen/pelvis showed contained perforation in the ascending colon.  He was started on IV antibiotic.  General surgery was consulted.  He underwent surgical intervention with right colectomy, drainage of retroperitoneal abscess and ileostomy on 07/25/23 by general surgery.  Care transferred to the general surgery service on 07/30/2023.  Assessment & Plan:   Perforated colitis with retroperitoneal abscess - underwent surgical intervention with right colectomy, drainage of retroperitoneal abscess and ileostomy on 07/25/23 by general surgery.  CT of abdomen and pelvis with contrast on 07/30/2023 showed possible pelvic abscess -Currently on Zosyn. -Ostomy/wound care/pain management and diet as per general surgery recommendations. -Care transferred to the general surgery service on 07/30/2023.  Hyponatremia -Possibly from dehydration and poor oral intake.  Sodium has improved to 132 today.  Decrease normal saline to 75 cc an hour.  Repeat a.m. labs.  Leukocytosis -Improving.  Monitor.    Hypertension -Blood pressure on the lower side.  Lisinopril on hold  Anxiety -Lexapro on hold  Elevated troponin during previous admission -Recently was seen by cardiology, underwent cardiac catheterization on 07/23/2023 with normal coronaries   Subjective: Patient seen and examined at bedside.  No fever, vomiting, worsening shortness of breath reported.  Complains of some abdominal pain.  Objective: Vitals:   07/31/23 0341 07/31/23 0500 07/31/23 0755 07/31/23 0757  BP: 105/70   112/77  Pulse: 91   84  Resp: (!) 22   (!) 22  Temp: 98.5  F (36.9 C)   98.4 F (36.9 C)  TempSrc: Oral  Oral Oral  SpO2: 94%   94%  Weight:  65.1 kg      Intake/Output Summary (Last 24 hours) at 07/31/2023 0810 Last data filed at 07/31/2023 0309 Gross per 24 hour  Intake 1642.05 ml  Output 2910 ml  Net -1267.95 ml   Filed Weights   07/31/23 0500  Weight: 65.1 kg    Examination:  General: Currently on room air.  No distress.  Chronically ill and deconditioned looking. ENT/neck: No thyromegaly.  JVD is not elevated  respiratory: Decreased breath sounds at bases bilaterally with some crackles; no wheezing.  Mild intermittent tachypnea present CVS: S1-S2 heard, rate controlled currently Abdominal: Soft, tender, slightly distended; no organomegaly, normal bowel sounds are heard dressing present with JP drain.  Ostomy present. Extremities: Trace lower extremity edema; no cyanosis  CNS: Awake and alert.  Still slow to respond.  No focal neurologic deficit.  Moves extremities Lymph: No obvious lymphadenopathy Skin: No obvious ecchymosis/lesions  psych: Mostly flat affect.  Currently not agitated. musculoskeletal: No obvious joint swelling/deformity     Data Reviewed: I have personally reviewed following labs and imaging studies  CBC: Recent Labs  Lab 07/25/23 2036 07/26/23 0252 07/27/23 0116 07/30/23 0126 07/31/23 0218  WBC 13.3* 14.0* 12.2* 16.3* 13.2*  NEUTROABS  --   --   --   --  10.9*  HGB 11.7* 10.8* 10.7* 11.6* 11.0*  HCT 36.5* 33.0* 31.9* 34.4* 32.7*  MCV 91.0 90.4 89.4 89.8 87.0  PLT 309 292 305 528* 620*   Basic Metabolic Panel: Recent Labs  Lab 07/25/23 0512 07/25/23 2036 07/26/23 0252 07/27/23 0116 07/30/23  0126 07/31/23 0218  NA 132*  --  133* 132* 125* 132*  K 4.0  --  4.4 4.7 4.7 4.0  CL 100  --  103 101 94* 100  CO2 21*  --  23 23 20* 24  GLUCOSE 139*  --  224* 140* 110* 114*  BUN 10  --  6 7 8 9   CREATININE 0.74 0.68 0.68 0.74 0.66 0.67  CALCIUM 8.5*  --  8.1* 7.9* 7.7* 7.8*  MG  --   --   --   --    --  2.3   GFR: Estimated Creatinine Clearance: 85.9 mL/min (by C-G formula based on SCr of 0.67 mg/dL). Liver Function Tests: Recent Labs  Lab 07/25/23 0512 07/26/23 0252 07/30/23 0126 07/31/23 0218  AST 27 21 21 26   ALT 38 31 28 32  ALKPHOS 80 47 76 79  BILITOT 0.5 0.5 0.7 0.6  PROT 7.5 6.0* 6.2* 6.1*  ALBUMIN 2.6* 2.5* 2.1* 1.9*   Recent Labs  Lab 07/25/23 0512  LIPASE 34   No results for input(s): "AMMONIA" in the last 168 hours. Coagulation Profile: No results for input(s): "INR", "PROTIME" in the last 168 hours. Cardiac Enzymes: No results for input(s): "CKTOTAL", "CKMB", "CKMBINDEX", "TROPONINI" in the last 168 hours. BNP (last 3 results) No results for input(s): "PROBNP" in the last 8760 hours. HbA1C: No results for input(s): "HGBA1C" in the last 72 hours. CBG: No results for input(s): "GLUCAP" in the last 168 hours. Lipid Profile: No results for input(s): "CHOL", "HDL", "LDLCALC", "TRIG", "CHOLHDL", "LDLDIRECT" in the last 72 hours. Thyroid Function Tests: No results for input(s): "TSH", "T4TOTAL", "FREET4", "T3FREE", "THYROIDAB" in the last 72 hours. Anemia Panel: No results for input(s): "VITAMINB12", "FOLATE", "FERRITIN", "TIBC", "IRON", "RETICCTPCT" in the last 72 hours. Sepsis Labs: Recent Labs  Lab 07/25/23 1208  LATICACIDVEN 0.5    Recent Results (from the past 240 hour(s))  Culture, blood (Routine X 2) w Reflex to ID Panel     Status: None   Collection Time: 07/22/23  1:58 PM   Specimen: BLOOD RIGHT ARM  Result Value Ref Range Status   Specimen Description BLOOD RIGHT ARM  Final   Special Requests   Final    BOTTLES DRAWN AEROBIC AND ANAEROBIC Blood Culture adequate volume   Culture   Final    NO GROWTH 5 DAYS Performed at [facility], [address], [place], [place] [address]    Report Status 07/27/2023 FINAL  Final  Culture, blood (Routine X 2) w Reflex to ID Panel     Status: None   Collection Time: 07/22/23  1:58 PM    Specimen: BLOOD LEFT HAND  Result Value Ref Range Status   Specimen Description BLOOD LEFT HAND  Final   Special Requests   Final    BOTTLES DRAWN AEROBIC AND ANAEROBIC Blood Culture adequate volume   Culture   Final    NO GROWTH 5 DAYS Performed at [facility], [address], [place], [place] [address]    Report Status 07/27/2023 FINAL  Final  Aerobic/Anaerobic Culture w Gram Stain (surgical/deep wound)     Status: None   Collection Time: 07/25/23  2:01 PM   Specimen: PATH Cytology Peritoneal fluid; Body Fluid  Result Value Ref Range Status   Specimen Description FLUID PERITONEAL  Final   Special Requests SWABS  Final   Gram Stain   Final    ABUNDANT WBC PRESENT, PREDOMINANTLY PMN ABUNDANT GRAM POSITIVE COCCI ABUNDANT  GRAM NEGATIVE RODS RARE GRAM POSITIVE RODS    Culture   Final    RARE ESCHERICHIA COLI MODERATE BACTEROIDES FRAGILIS BETA LACTAMASE POSITIVE Performed at [facility], [address], [place], [place] [address]    Report Status 07/28/2023 FINAL  Final   Organism ID, Bacteria ESCHERICHIA COLI  Final      Susceptibility   Escherichia coli - MIC*    AMPICILLIN 8 SENSITIVE Sensitive     CEFEPIME <=0.12 SENSITIVE Sensitive     CEFTAZIDIME <=1 SENSITIVE Sensitive     CEFTRIAXONE <=0.25 SENSITIVE Sensitive     CIPROFLOXACIN <=0.25 SENSITIVE Sensitive     GENTAMICIN <=1 SENSITIVE Sensitive     IMIPENEM <=0.25 SENSITIVE Sensitive     TRIMETH/SULFA <=20 SENSITIVE Sensitive     AMPICILLIN/SULBACTAM 4 SENSITIVE Sensitive     PIP/TAZO <=4 SENSITIVE Sensitive     * RARE ESCHERICHIA COLI         Radiology Studies: CT ABDOMEN PELVIS W CONTRAST  Result Date: 07/30/2023 CLINICAL DATA:  Postoperative abdominal pain. EXAM: CT ABDOMEN AND PELVIS WITH CONTRAST TECHNIQUE: Multidetector CT imaging of the abdomen and pelvis was performed using the standard protocol following bolus administration of intravenous contrast. RADIATION DOSE REDUCTION: This exam was  performed according to the departmental dose-optimization program which includes automated exposure control, adjustment of the mA and/or kV according to patient size and/or use of iterative reconstruction technique. CONTRAST:  75mL OMNIPAQUE IOHEXOL 350 MG/ML SOLN COMPARISON:  July 25, 2023. FINDINGS: Lower chest: Mild bibasilar subsegmental atelectasis is noted. Hepatobiliary: Cholelithiasis. No biliary dilatation is noted. Probable small peripheral right hepatic cyst. Pancreas: Unremarkable. No pancreatic ductal dilatation or surrounding inflammatory changes. Spleen: Normal in size without focal abnormality. Adrenals/Urinary Tract: Adrenal glands appear normal. Right renal cyst is noted for which no further follow-up is required. No hydronephrosis or renal obstruction is noted. Urinary bladder is unremarkable. Stomach/Bowel: Stomach is unremarkable. Status post right hemicolectomy. Surgical drain is seen in right side of abdomen in surgical bed. Minimal amount of free fluid is noted in this area which most likely is postoperative. Midline surgical wound is noted. No abnormal bowel dilatation is noted. Vascular/Lymphatic: No significant vascular findings are present. No enlarged abdominal or pelvic lymph nodes. Reproductive: Prostate is unremarkable. Other: As noted above, midline surgical wound is noted with small amount of free air seen anteriorly in the midline of the abdomen consistent with recent postoperative status. 5.2 x 2.0 cm fluid collection with enhancing wall is noted in the pelvis centrally which could represent abscess. Musculoskeletal: No acute or significant osseous findings. IMPRESSION: Status post right hemicolectomy with surgical drain seen in right side of the abdomen and minimal free fluid in this area consistent with expected postoperative change. However, 5.2 x 2.0 cm fluid collection with enhancing wall is noted centrally in the pelvis which could represent abscess. Cholelithiasis.  Electronically Signed   By: Lupita Raider M.D.   On: 07/30/2023 13:39        Scheduled Meds:  acetaminophen  1,000 mg Oral Q6H   enoxaparin (LOVENOX) injection  40 mg Subcutaneous Q24H   feeding supplement  237 mL Oral BID BM   methocarbamol  500 mg Oral Q6H   sodium hypochlorite   Irrigation BID   Continuous Infusions:  sodium chloride 100 mL/hr at 07/30/23 2130   piperacillin-tazobactam (ZOSYN)  IV 3.375 g (07/31/23 0044)          Glade Lloyd, MD [facility] 07/31/2023, 8:10 AM '

## 2023-07-31 NOTE — TOC Progression Note (Signed)
Transition of Care (TOC) - Progression Note  Donn Pierini RN,BSN Transitions of Care Unit 4NP (Non Trauma)- RN Case Manager See Treatment Team for direct Phone #   Patient Details  Name: Carl Briggs MRN: [id number] Date of Birth: Jul 03, 1971  [facility] of [facility] CM/SW Contact  Zenda Alpers, Lenn Sink, RN Phone Number: 07/31/2023, 2:52 PM  Clinical Narrative:    CM received update from [facility] liaison regarding [facility] referral for charity care.  Per Tresa Endo she has spoken with daughter and confirmed pt is eligible for [facility] services under charity care program. Buelah Manis has confirmed referral has been accepted with available RN staffing- They will contact pt to schedule- anticipate initial visit this Saturday 8/6.   Daughter informed HH liaison that she will plan to be here for wound care/ostomy education with WOC nurse tomorrow.   DME orders have been placed- TOC will assist under LOG for DME needs- RW and BSC.  Call made to [employer] liaison for DME needs- RW and BSC to be delivered to room prior to discharge.  LOG approved by Nehemiah Settle, and to emailed to [employer].    Expected Discharge Plan: Home w Home Health Services Barriers to Discharge: Continued Medical Work up  Expected Discharge Plan and Services   Discharge Planning Services: CM Consult Post Acute Care Choice: Durable Medical Equipment, Home Health Living arrangements for the past 2 months: Mobile Home                 DME Arranged: Bedside commode, Walker rolling         HH Arranged: RN HH Agency: [facility] Date HH Agency Contacted: 07/29/23 Time HH Agency Contacted: 1429 Representative spoke with at [facility] Agency: Tresa Endo   Social Determinants of Health (SDOH) Interventions SDOH Screenings   Food Insecurity: No Food Insecurity (07/20/2023)  Housing: Low Risk  (07/20/2023)  Transportation Needs: No Transportation Needs (07/20/2023)  Utilities: Not At Risk (07/20/2023)  Depression (PHQ2-9): Low Risk   (10/19/2019)  Tobacco Use: Low Risk  (07/25/2023)    Readmission Risk Interventions     No data to display

## 2023-08-01 LAB — MRSA NEXT GEN BY PCR, NASAL: MRSA by PCR Next Gen: NOT DETECTED

## 2023-08-01 NOTE — Plan of Care (Signed)

## 2023-08-01 NOTE — Progress Notes (Signed)
PROGRESS NOTE    Carl Briggs  GMW:[id number] DOB: [birth year] DOA: 07/25/2023 PCP: Patient, No Pcp Per   Brief Narrative:  52 y.o. male, with history of hypertension, hyperlipidemia, anxiety disorder who was just discharged from the hospital on 07/24/2023 after he was treated for sepsis due to colitis presented with worsening abdominal pain.  On presentation, CT of the abdomen/pelvis showed contained perforation in the ascending colon.  He was started on IV antibiotic.  General surgery was consulted.  He underwent surgical intervention with right colectomy, drainage of retroperitoneal abscess and ileostomy on 07/25/23 by general surgery.  Care transferred to the general surgery service on 07/30/2023.  Assessment & Plan:   Perforated colitis with retroperitoneal abscess - underwent surgical intervention with right colectomy, drainage of retroperitoneal abscess and ileostomy on 07/25/23 by general surgery.  CT of abdomen and pelvis with contrast on 07/30/2023 showed possible pelvic abscess -Zosyn has been switched to oral Augmentin by general surgery. -Ostomy/wound care/pain management and diet as per general surgery recommendations. -Care transferred to the general surgery service on 07/30/2023.  Hyponatremia -Possibly from dehydration and poor oral intake.  Sodium i 132 today as well.  Decrease normal saline to 50 cc an hour.  Repeat a.m. labs.  Leukocytosis -Resolved.    Thrombocytosis -Possibly reactive  Hypertension -Blood pressure on the lower side.  Lisinopril on hold  Anxiety -Lexapro on hold  Elevated troponin during previous admission -Recently was seen by cardiology, underwent cardiac catheterization on 07/23/2023 with normal coronaries   Subjective: Patient seen and examined at bedside.  No chest pain, worsening shortness breath reported.  Still has intermittent abdominal pain. Objective: Vitals:   07/31/23 2318 08/01/23 0330 08/01/23 0624 08/01/23 0745  BP: (!) 136/94  107/71  113/82  Pulse: (!) 117 96  88  Resp: 17 20  18   Temp: 99.4 F (37.4 C) 98.2 F (36.8 C)  97.9 F (36.6 C)  TempSrc: Axillary Oral    SpO2: 92% 98%  95%  Weight:   70.1 kg     Intake/Output Summary (Last 24 hours) at 08/01/2023 0801 Last data filed at 08/01/2023 0600 Gross per 24 hour  Intake 2674.06 ml  Output 2785 ml  Net -110.94 ml   Filed Weights   07/31/23 0500 08/01/23 0624  Weight: 65.1 kg 70.1 kg    Examination:  General: On room air.  No distress.  respiratory: Decreased breath sounds at bases bilaterally with some crackles CVS: Currently rate controlled; S1-S2 heard  abdominal: Soft, tender, slightly distended, no organomegaly; normal bowel sounds are heard.  Dressing/JP drain/ostomy present extremities: Mild lower extremity edema; no clubbing.        Data Reviewed: I have personally reviewed following labs and imaging studies  CBC: Recent Labs  Lab 07/26/23 0252 07/27/23 0116 07/30/23 0126 07/31/23 0218 08/01/23 0554  WBC 14.0* 12.2* 16.3* 13.2* 10.9*  NEUTROABS  --   --   --  10.9* 8.7*  HGB 10.8* 10.7* 11.6* 11.0* 11.5*  HCT 33.0* 31.9* 34.4* 32.7* 33.8*  MCV 90.4 89.4 89.8 87.0 88.7  PLT 292 305 528* 620* 658*   Basic Metabolic Panel: Recent Labs  Lab 07/26/23 0252 07/27/23 0116 07/30/23 0126 07/31/23 0218 08/01/23 0554  NA 133* 132* 125* 132* 132*  K 4.4 4.7 4.7 4.0 4.0  CL 103 101 94* 100 97*  CO2 23 23 20* 24 23  GLUCOSE 224* 140* 110* 114* 117*  BUN 6 7 8 9 7   CREATININE 0.68 0.74 0.66 0.67 0.58*  CALCIUM 8.1* 7.9* 7.7* 7.8* 8.1*  MG  --   --   --  2.3 2.1   GFR: Estimated Creatinine Clearance: 89 mL/min (A) (by C-G formula based on SCr of 0.58 mg/dL (L)). Liver Function Tests: Recent Labs  Lab 07/26/23 0252 07/30/23 0126 07/31/23 0218  AST 21 21 26   ALT 31 28 32  ALKPHOS 47 76 79  BILITOT 0.5 0.7 0.6  PROT 6.0* 6.2* 6.1*  ALBUMIN 2.5* 2.1* 1.9*   No results for input(s): "LIPASE", "AMYLASE" in the last 168  hours.  No results for input(s): "AMMONIA" in the last 168 hours. Coagulation Profile: No results for input(s): "INR", "PROTIME" in the last 168 hours. Cardiac Enzymes: No results for input(s): "CKTOTAL", "CKMB", "CKMBINDEX", "TROPONINI" in the last 168 hours. BNP (last 3 results) No results for input(s): "PROBNP" in the last 8760 hours. HbA1C: No results for input(s): "HGBA1C" in the last 72 hours. CBG: No results for input(s): "GLUCAP" in the last 168 hours. Lipid Profile: No results for input(s): "CHOL", "HDL", "LDLCALC", "TRIG", "CHOLHDL", "LDLDIRECT" in the last 72 hours. Thyroid Function Tests: No results for input(s): "TSH", "T4TOTAL", "FREET4", "T3FREE", "THYROIDAB" in the last 72 hours. Anemia Panel: No results for input(s): "VITAMINB12", "FOLATE", "FERRITIN", "TIBC", "IRON", "RETICCTPCT" in the last 72 hours. Sepsis Labs: Recent Labs  Lab 07/25/23 1208  LATICACIDVEN 0.5    Recent Results (from the past 240 hour(s))  Culture, blood (Routine X 2) w Reflex to ID Panel     Status: None   Collection Time: 07/22/23  1:58 PM   Specimen: BLOOD RIGHT ARM  Result Value Ref Range Status   Specimen Description BLOOD RIGHT ARM  Final   Special Requests   Final    BOTTLES DRAWN AEROBIC AND ANAEROBIC Blood Culture adequate volume   Culture   Final    NO GROWTH 5 DAYS Performed at [facility], [address], [place], [place] [address]    Report Status 07/27/2023 FINAL  Final  Culture, blood (Routine X 2) w Reflex to ID Panel     Status: None   Collection Time: 07/22/23  1:58 PM   Specimen: BLOOD LEFT HAND  Result Value Ref Range Status   Specimen Description BLOOD LEFT HAND  Final   Special Requests   Final    BOTTLES DRAWN AEROBIC AND ANAEROBIC Blood Culture adequate volume   Culture   Final    NO GROWTH 5 DAYS Performed at [facility], [address] [place], [place] [address]    Report Status 07/27/2023 FINAL  Final  Aerobic/Anaerobic Culture w Gram  Stain (surgical/deep wound)     Status: None   Collection Time: 07/25/23  2:01 PM   Specimen: PATH Cytology Peritoneal fluid; Body Fluid  Result Value Ref Range Status   Specimen Description FLUID PERITONEAL  Final   Special Requests SWABS  Final   Gram Stain   Final    ABUNDANT WBC PRESENT, PREDOMINANTLY PMN ABUNDANT GRAM POSITIVE COCCI ABUNDANT GRAM NEGATIVE RODS RARE GRAM POSITIVE RODS    Culture   Final    RARE ESCHERICHIA COLI MODERATE BACTEROIDES FRAGILIS BETA LACTAMASE POSITIVE Performed at [facility], [address], [place], [place] [address]    Report Status 07/28/2023 FINAL  Final   Organism ID, Bacteria ESCHERICHIA COLI  Final      Susceptibility   Escherichia coli - MIC*    AMPICILLIN 8 SENSITIVE Sensitive     CEFEPIME <=0.12 SENSITIVE Sensitive  CEFTAZIDIME <=1 SENSITIVE Sensitive     CEFTRIAXONE <=0.25 SENSITIVE Sensitive     CIPROFLOXACIN <=0.25 SENSITIVE Sensitive     GENTAMICIN <=1 SENSITIVE Sensitive     IMIPENEM <=0.25 SENSITIVE Sensitive     TRIMETH/SULFA <=20 SENSITIVE Sensitive     AMPICILLIN/SULBACTAM 4 SENSITIVE Sensitive     PIP/TAZO <=4 SENSITIVE Sensitive     * RARE ESCHERICHIA COLI         Radiology Studies: CT ABDOMEN PELVIS W CONTRAST  Result Date: 07/30/2023 CLINICAL DATA:  Postoperative abdominal pain. EXAM: CT ABDOMEN AND PELVIS WITH CONTRAST TECHNIQUE: Multidetector CT imaging of the abdomen and pelvis was performed using the standard protocol following bolus administration of intravenous contrast. RADIATION DOSE REDUCTION: This exam was performed according to the departmental dose-optimization program which includes automated exposure control, adjustment of the mA and/or kV according to patient size and/or use of iterative reconstruction technique. CONTRAST:  75mL OMNIPAQUE IOHEXOL 350 MG/ML SOLN COMPARISON:  July 25, 2023. FINDINGS: Lower chest: Mild bibasilar subsegmental atelectasis is noted. Hepatobiliary: Cholelithiasis. No  biliary dilatation is noted. Probable small peripheral right hepatic cyst. Pancreas: Unremarkable. No pancreatic ductal dilatation or surrounding inflammatory changes. Spleen: Normal in size without focal abnormality. Adrenals/Urinary Tract: Adrenal glands appear normal. Right renal cyst is noted for which no further follow-up is required. No hydronephrosis or renal obstruction is noted. Urinary bladder is unremarkable. Stomach/Bowel: Stomach is unremarkable. Status post right hemicolectomy. Surgical drain is seen in right side of abdomen in surgical bed. Minimal amount of free fluid is noted in this area which most likely is postoperative. Midline surgical wound is noted. No abnormal bowel dilatation is noted. Vascular/Lymphatic: No significant vascular findings are present. No enlarged abdominal or pelvic lymph nodes. Reproductive: Prostate is unremarkable. Other: As noted above, midline surgical wound is noted with small amount of free air seen anteriorly in the midline of the abdomen consistent with recent postoperative status. 5.2 x 2.0 cm fluid collection with enhancing wall is noted in the pelvis centrally which could represent abscess. Musculoskeletal: No acute or significant osseous findings. IMPRESSION: Status post right hemicolectomy with surgical drain seen in right side of the abdomen and minimal free fluid in this area consistent with expected postoperative change. However, 5.2 x 2.0 cm fluid collection with enhancing wall is noted centrally in the pelvis which could represent abscess. Cholelithiasis. Electronically Signed   By: Lupita Raider M.D.   On: 07/30/2023 13:39        Scheduled Meds:  acetaminophen  1,000 mg Oral Q6H   amoxicillin-clavulanate  1 tablet Oral Q12H   enoxaparin (LOVENOX) injection  40 mg Subcutaneous Q24H   feeding supplement  237 mL Oral BID BM   methocarbamol  500 mg Oral Q6H   Continuous Infusions:  sodium chloride 75 mL/hr at 08/01/23 0309           Glade Lloyd, MD [facility] 08/01/2023, 8:01 AM '

## 2023-08-01 NOTE — Progress Notes (Signed)
[facility] Progress Note  7 Days Post-Op  Subjective: WBC normalized.  Wound and ostomy charity care arranged.  Patient does not feel comfortable going home today as the JP turned purulent and the wound continues to drain and smell.  Objective: Vital signs in last 24 hours: Temp:  [97.8 F (36.6 C)-99.4 F (37.4 C)] 97.9 F (36.6 C) (08/09 0745) Pulse Rate:  [88-117] 88 (08/09 0745) Resp:  [17-20] 18 (08/09 0745) BP: (107-137)/(71-94) 113/82 (08/09 0745) SpO2:  [92 %-98 %] 95 % (08/09 0745) Weight:  [70.1 kg] 70.1 kg (08/09 0624) Last BM Date : 07/31/23  Intake/Output from previous day: 08/08 0701 - 08/09 0700 In: 2674.1 [P.O.:780; I.V.:1894.1] Out: 2885 [Urine:2050; Drains:60; Stool:775] Intake/Output this shift: Total I/O In: -  Out: 100 [Urine:100]  PE:  Gen:  Alert, NAD, pleasant Card:  RRR Pulm:  rate and effort normal  Abd: Soft, ND, appropriately tender, open midline with necrotic base moderate drainage.  Fascia intact. JP purulent, ostomy viable thick stool in the bag  Lab Results:  Recent Labs    07/31/23 0218 08/01/23 0554  WBC 13.2* 10.9*  HGB 11.0* 11.5*  HCT 32.7* 33.8*  PLT 620* 658*   BMET Recent Labs    07/31/23 0218 08/01/23 0554  NA 132* 132*  K 4.0 4.0  CL 100 97*  CO2 24 23  GLUCOSE 114* 117*  BUN 9 7  CREATININE 0.67 0.58*  CALCIUM 7.8* 8.1*   PT/INR No results for input(s): "LABPROT", "INR" in the last 72 hours. CMP     Component Value Date/Time   NA 132 (L) 08/01/2023 0554   NA 141 12/14/2018 1545   K 4.0 08/01/2023 0554   CL 97 (L) 08/01/2023 0554   CO2 23 08/01/2023 0554   GLUCOSE 117 (H) 08/01/2023 0554   BUN 7 08/01/2023 0554   BUN 14 12/14/2018 1545   CREATININE 0.58 (L) 08/01/2023 0554   CALCIUM 8.1 (L) 08/01/2023 0554   PROT 6.1 (L) 07/31/2023 0218   PROT 7.3 12/14/2018 1545   ALBUMIN 1.9 (L) 07/31/2023 0218   ALBUMIN 4.1 12/14/2018 1545   AST 26 07/31/2023 0218   ALT 32 07/31/2023 0218    ALKPHOS 79 07/31/2023 0218   BILITOT 0.6 07/31/2023 0218   BILITOT 0.4 12/14/2018 1545   GFRNONAA >60 08/01/2023 0554   GFRAA 118 12/14/2018 1545   Lipase     Component Value Date/Time   LIPASE 34 07/25/2023 0512       Studies/Results: CT ABDOMEN PELVIS W CONTRAST  Result Date: 07/30/2023 CLINICAL DATA:  Postoperative abdominal pain. EXAM: CT ABDOMEN AND PELVIS WITH CONTRAST TECHNIQUE: Multidetector CT imaging of the abdomen and pelvis was performed using the standard protocol following bolus administration of intravenous contrast. RADIATION DOSE REDUCTION: This exam was performed according to the departmental dose-optimization program which includes automated exposure control, adjustment of the mA and/or kV according to patient size and/or use of iterative reconstruction technique. CONTRAST:  75mL OMNIPAQUE IOHEXOL 350 MG/ML SOLN COMPARISON:  July 25, 2023. FINDINGS: Lower chest: Mild bibasilar subsegmental atelectasis is noted. Hepatobiliary: Cholelithiasis. No biliary dilatation is noted. Probable small peripheral right hepatic cyst. Pancreas: Unremarkable. No pancreatic ductal dilatation or surrounding inflammatory changes. Spleen: Normal in size without focal abnormality. Adrenals/Urinary Tract: Adrenal glands appear normal. Right renal cyst is noted for which no further follow-up is required. No hydronephrosis or renal obstruction is noted. Urinary bladder is unremarkable. Stomach/Bowel: Stomach is unremarkable. Status post right hemicolectomy. Surgical drain is seen in  right side of abdomen in surgical bed. Minimal amount of free fluid is noted in this area which most likely is postoperative. Midline surgical wound is noted. No abnormal bowel dilatation is noted. Vascular/Lymphatic: No significant vascular findings are present. No enlarged abdominal or pelvic lymph nodes. Reproductive: Prostate is unremarkable. Other: As noted above, midline surgical wound is noted with small amount of free  air seen anteriorly in the midline of the abdomen consistent with recent postoperative status. 5.2 x 2.0 cm fluid collection with enhancing wall is noted in the pelvis centrally which could represent abscess. Musculoskeletal: No acute or significant osseous findings. IMPRESSION: Status post right hemicolectomy with surgical drain seen in right side of the abdomen and minimal free fluid in this area consistent with expected postoperative change. However, 5.2 x 2.0 cm fluid collection with enhancing wall is noted centrally in the pelvis which could represent abscess. Cholelithiasis. Electronically Signed   By: Lupita Raider M.D.   On: 07/30/2023 13:39    Anti-infectives: Anti-infectives (From admission, onward)    Start     Dose/Rate Route Frequency Ordered Stop   07/31/23 1000  amoxicillin-clavulanate (AUGMENTIN) 875-125 MG per tablet 1 tablet        1 tablet Oral Every 12 hours 07/31/23 0900     07/26/23 0030  piperacillin-tazobactam (ZOSYN) IVPB 3.375 g  Status:  Discontinued        3.375 g 12.5 mL/hr over 240 Minutes Intravenous Every 8 hours 07/26/23 0019 07/31/23 0900   07/25/23 1400  piperacillin-tazobactam (ZOSYN) IVPB 3.375 g  Status:  Discontinued        3.375 g 12.5 mL/hr over 240 Minutes Intravenous Every 8 hours 07/25/23 1207 07/26/23 0019   07/25/23 1100  piperacillin-tazobactam (ZOSYN) IVPB 3.375 g        3.375 g 100 mL/hr over 30 Minutes Intravenous  Once 07/25/23 1051 07/25/23 1204        Assessment/Plan POD#5 s/p RIGHT COLECTOMY, DRAINAGE RETROPERITONEAL ABSCESS, 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 Janee Morn for perforated colitis with RTP abscess - CT 8/7 with a small intra-abdominal collection not amenable to percutaneous drainage - Continue Dakins BID to midline  - Continue Augmentin now and at discharge - will need prolonged course due to undrained small fluid collections on CT - WOC c/s for new ostomy. Appreciate ostomy charity care arrangements - continue JP  - continue abx   ID -  zosyn 8/2 - 8/8, Augmentin 8/8 -  FEN - IVF, regular diet VTE - SCDs, lovenox Foley - out 8/3 and voiding   Uncontrolled HTN - per medicine Recent admit for elevated troponin, felt likely demand ischemia - left heart cath negative for CAD    LOS: 7 days    Quentin Ore, MD  [facility] 08/01/2023, 9:36 AM Please see Amion for pager number during day hours 7:00am-4:30pm

## 2023-08-01 NOTE — Consult Note (Signed)
WOC Nurse ostomy follow up Stoma type/location: RLQ, end ileostomy  Stomal assessment/size: 1 5/8" slightly oval, budded, pink, moist, Os at center Peristomal assessment: intact  Treatment options for stomal/peristomal skin: 2" ostomy barrier ring Output; brown/green with food particles  Ostomy pouching: 2pc. 2 3/4" with 2" skin barrier ring  Education provided:  Explained role of ostomy nurse and creation of stoma to patient, wife, and daughter Explained stoma characteristics (budded, flush, color, texture, care) Demonstrated pouch change (cutting new skin barrier, measuring stoma, cleaning peristomal skin and stoma, use of barrier ring) Allowed wife to cut new skin barrier, but she will require more education Education on emptying when 1/3 to 1/2 full and how to empty Demonstrated use of wick to clean spout  Patient and wife open and closed stoma Risk for dehydration discussed, importance of protein intake.   Patient and family are more concerned with status of JP drainage and wound, will need further education on care of JP and wound. Patient to have HHRN beginning tomorrow.   8 pouches/skin barriers/barrier rings, template with off centered opening and all educational materials in the room IN [place].  DISCUSSED INDIGENT PROGRAM WITH DAUGHTER, SHE WILL CALL TODAY     Enrolled patient in [place] Secure Start Discharge program: Yes  WOC Nurse will follow along with you for continued support with ostomy teaching and care Larri Brewton [facility] MSN, RN, [place], CNS, [place] [phone]

## 2023-08-01 NOTE — Progress Notes (Signed)
Physical Therapy Treatment Patient Details Name: Carl Briggs MRN: [id number] DOB: 02-15-1971 Today's Date: 08/01/2023   History of Present Illness The pt is a 52 yo male presenting 8/2 with abdominal pain, emesis, and diarrhea. Pt found to have perforated ascending colon and is s/p R colectomy with ileostomy on 8/2. Pt with recent admission for sepsis due to colitis and was d/c home on 8/1. PMH includes: HTN, HLD, and anxiety.    PT Comments  Pt limited by abdominal pain today, needing minA to transition sidelying <> sit EOB. However, he was able to progress to ambulating without UE support and navigate x3 stairs with x1 handrail without LOB or physical assistance. He would still benefit from the RW for pain management though. Will continue to follow acutely.     If plan is discharge home, recommend the following: A little help with bathing/dressing/bathroom;Assistance with cooking/housework;Assist for transportation;Help with stairs or ramp for entrance   Can travel by private vehicle        Equipment Recommendations  Rolling walker (2 wheels);BSC/3in1    Recommendations for Other Services       Precautions / Restrictions Precautions Precautions: Fall Precaution Comments: colostomy Restrictions Weight Bearing Restrictions: No     Mobility  Bed Mobility Overal bed mobility: Needs Assistance Bed Mobility: Rolling, Sidelying to Sit, Sit to Sidelying Rolling: Supervision Sidelying to sit: Min assist, HOB elevated     Sit to sidelying: Min assist, HOB elevated General bed mobility comments: Cues to roll to transition sidelying to sit to reduce abdominal muscle use and pain. Supervision to roll but minA with pt pulling up on therapist to ascend trunk. MinA to lift legs onto bed.    Transfers Overall transfer level: Needs assistance Equipment used: None Transfers: Sit to/from Stand Sit to Stand: Contact guard assist           General transfer comment: Contact  guard for safety, slow to rise due to pain    Ambulation/Gait Ambulation/Gait assistance: Contact guard assist Gait Distance (Feet): 240 Feet Assistive device: None Gait Pattern/deviations: Step-through pattern, Decreased stride length, Trunk flexed Gait velocity: decreased Gait velocity interpretation: <1.8 ft/sec, indicate of risk for recurrent falls   General Gait Details: Slow and steady pace with flexed posture and guarding of abdomen due to pain, no LOB, contact guard for safety   Stairs Stairs: Yes Stairs assistance: Contact guard assist Stair Management: One rail Right, One rail Left, Step to pattern, Alternating pattern, Forwards Number of Stairs: 3 General stair comments: Ascends with R rail and step-to pattern. Descends with L rail and reciprocal pattern. No LOB, contact guard for safety. Slow due to pain   Wheelchair Mobility     Tilt Bed    Modified Rankin (Stroke Patients Only)       Balance Overall balance assessment: Needs assistance Sitting-balance support: No upper extremity supported Sitting balance-Leahy Scale: Good     Standing balance support: During functional activity, No upper extremity supported Standing balance-Leahy Scale: Good                              Cognition Arousal: Alert Behavior During Therapy: WFL for tasks assessed/performed Overall Cognitive Status: Within Functional Limits for tasks assessed  Exercises      General Comments General comments (skin integrity, edema, etc.): In person interpreter Byrd Hesselbach utilized throughout session; encouraged hallway ambulation 2-3x/day      Pertinent Vitals/Pain Pain Assessment Pain Assessment: Faces Faces Pain Scale: Hurts whole lot Pain Location: abdomen Pain Descriptors / Indicators: Discomfort, Grimacing, Guarding Pain Intervention(s): Limited activity within patient's tolerance, Monitored during session,  Repositioned, Patient requesting pain meds-RN notified    Home Living                          Prior Function            PT Goals (current goals can now be found in the care plan section) Acute Rehab PT Goals Patient Stated Goal: return home PT Goal Formulation: With patient Time For Goal Achievement: 08/09/23 Potential to Achieve Goals: Good Progress towards PT goals: Progressing toward goals    Frequency    Min 1X/week      PT Plan Current plan remains appropriate    Co-evaluation              AM-PAC PT "6 Clicks" Mobility   Outcome Measure  Help needed turning from your back to your side while in a flat bed without using bedrails?: A Little Help needed moving from lying on your back to sitting on the side of a flat bed without using bedrails?: A Little Help needed moving to and from a bed to a chair (including a wheelchair)?: A Little Help needed standing up from a chair using your arms (e.g., wheelchair or bedside chair)?: A Little Help needed to walk in hospital room?: A Little Help needed climbing 3-5 steps with a railing? : A Little 6 Click Score: 18    End of Session   Activity Tolerance: Patient limited by pain Patient left: with call bell/phone within reach;in bed;with bed alarm set Nurse Communication: Patient requests pain meds;Mobility status PT Visit Diagnosis: Other abnormalities of gait and mobility (R26.89);Pain Pain - part of body:  (abdomen)     Time: 6295-2841 PT Time Calculation (min) (ACUTE ONLY): 15 min  Charges:    $Therapeutic Activity: 8-22 mins PT General Charges $$ ACUTE PT VISIT: 1 Visit                     [name], PT, DPT Acute Rehabilitation Services  Office: [phone]    [name] 08/01/2023, 5:46 PM

## 2023-08-01 NOTE — Progress Notes (Signed)
Ordered pt meals by Viria Alvarez [employer]. 

## 2023-08-02 MED ORDER — SODIUM CHLORIDE 0.9 % IV BOLUS
1000.0000 mL | Freq: Once | INTRAVENOUS | Status: AC
  Administered 2023-08-02: 1000 mL via INTRAVENOUS

## 2023-08-02 MED ORDER — SODIUM ZIRCONIUM CYCLOSILICATE 10 G PO PACK
10.0000 g | PACK | Freq: Once | ORAL | Status: AC
  Administered 2023-08-02: 10 g via ORAL
  Filled 2023-08-02: qty 1

## 2023-08-02 MED ORDER — MORPHINE SULFATE (PF) 2 MG/ML IV SOLN: 2.0000 mg | Freq: Three times a day (TID) | INTRAVENOUS | Status: DC | PRN

## 2023-08-02 MED ORDER — METHOCARBAMOL 500 MG PO TABS
1000.0000 mg | ORAL_TABLET | Freq: Four times a day (QID) | ORAL | Status: DC
  Administered 2023-08-02 – 2023-08-08 (×25): 1000 mg via ORAL
  Filled 2023-08-02 (×25): qty 2

## 2023-08-02 MED ORDER — IBUPROFEN 200 MG PO TABS
600.0000 mg | ORAL_TABLET | Freq: Four times a day (QID) | ORAL | Status: DC
  Administered 2023-08-02 – 2023-08-06 (×16): 600 mg via ORAL
  Filled 2023-08-02 (×18): qty 3

## 2023-08-02 MED ORDER — OXYCODONE HCL 5 MG PO TABS
10.0000 mg | ORAL_TABLET | ORAL | Status: DC | PRN
  Administered 2023-08-02 – 2023-08-05 (×8): 15 mg via ORAL
  Administered 2023-08-05: 10 mg via ORAL
  Administered 2023-08-05: 15 mg via ORAL
  Administered 2023-08-06: 10 mg via ORAL
  Administered 2023-08-06: 15 mg via ORAL
  Administered 2023-08-07 (×2): 10 mg via ORAL
  Administered 2023-08-07 – 2023-08-08 (×4): 15 mg via ORAL
  Filled 2023-08-02 (×5): qty 3
  Filled 2023-08-02: qty 2
  Filled 2023-08-02: qty 3
  Filled 2023-08-02: qty 2
  Filled 2023-08-02 (×3): qty 3
  Filled 2023-08-02: qty 2
  Filled 2023-08-02 (×4): qty 3
  Filled 2023-08-02: qty 2
  Filled 2023-08-02: qty 3
  Filled 2023-08-02: qty 2

## 2023-08-02 NOTE — Progress Notes (Addendum)
PROGRESS NOTE    Carl Briggs  ZOX:[id number] DOB: 01-29-1971 DOA: 07/25/2023 PCP: Patient, No Pcp Per   Brief Narrative:  52 y.o. male, with history of hypertension, hyperlipidemia, anxiety disorder who was just discharged from the hospital on 07/24/2023 after he was treated for sepsis due to colitis presented with worsening abdominal pain.  On presentation, CT of the abdomen/pelvis showed contained perforation in the ascending colon.  He was started on IV antibiotic.  General surgery was consulted.  He underwent surgical intervention with right colectomy, drainage of retroperitoneal abscess and ileostomy on 07/25/23 by general surgery.  Care transferred to the general surgery service on 07/30/2023.  Assessment & Plan:   Perforated colitis with retroperitoneal abscess - underwent surgical intervention with right colectomy, drainage of retroperitoneal abscess and ileostomy on 07/25/23 by general surgery.  CT of abdomen and pelvis with contrast on 07/30/2023 showed possible pelvic abscess -Zosyn has been switched to oral Augmentin by general surgery. -Ostomy/wound care/pain management and diet as per general surgery recommendations. -Care transferred to the general surgery service on 07/30/2023.  Hyponatremia -Possibly from dehydration and poor oral intake.  Sodium 133 on 08/02/2023.  Monitor intermittently.  Off IV fluids.  Encourage oral intake.    Leukocytosis -WBCs bumped up to 15.4 on 08/02/2023.  Management as per primary surgical team  Thrombocytosis -Possibly reactive  Hypertension -Blood pressure on the lower side.  Lisinopril on hold  Anxiety -Lexapro on hold  Elevated troponin during previous admission -Recently was seen by cardiology, underwent cardiac catheterization on 07/23/2023 with normal coronaries   Subjective: Patient seen and examined at bedside.  Complains of intermittent abdominal pain with some nausea.  No fever, chest pain or worsening shortness of breath reported   objective: Vitals:   08/01/23 2340 08/02/23 0325 08/02/23 0500 08/02/23 0741  BP: 128/81 104/81  113/80  Pulse: (!) 112   97  Resp: 20 19  16   Temp: 98.1 F (36.7 C) 98.2 F (36.8 C)  98.2 F (36.8 C)  TempSrc: Oral Oral    SpO2: 96% 98%  94%  Weight:   70.1 kg     Intake/Output Summary (Last 24 hours) at 08/02/2023 0754 Last data filed at 08/02/2023 0500 Gross per 24 hour  Intake 1311.27 ml  Output 2525 ml  Net -1213.73 ml   Filed Weights   07/31/23 0500 08/01/23 0624 08/02/23 0500  Weight: 65.1 kg 70.1 kg 70.1 kg    Examination:  General: On room air currently.  No distress.  Looks chronically ill and deconditioned. respiratory: Decreased breath sounds at bases bilaterally with some crackles  CVS: Mostly rate controlled; S1 and S2 heard  abdominal: Soft, tender with some distention; no organomegaly; bowel sounds heard.  Dressing/JP drain/ostomy present extremities: Mild lower extremity edema present; no clubbing     Data Reviewed: I have personally reviewed following labs and imaging studies  CBC: Recent Labs  Lab 07/27/23 0116 07/30/23 0126 07/31/23 0218 08/01/23 0554 08/02/23 0542  WBC 12.2* 16.3* 13.2* 10.9* 15.4*  NEUTROABS  --   --  10.9* 8.7* 12.8*  HGB 10.7* 11.6* 11.0* 11.5* 14.0  HCT 31.9* 34.4* 32.7* 33.8* 42.1  MCV 89.4 89.8 87.0 88.7 87.9  PLT 305 528* 620* 658* 817*   Basic Metabolic Panel: Recent Labs  Lab 07/27/23 0116 07/30/23 0126 07/31/23 0218 08/01/23 0554 08/02/23 0542  NA 132* 125* 132* 132* 133*  K 4.7 4.7 4.0 4.0 5.1  CL 101 94* 100 97* 100  CO2 23 20* 24  23 20*  GLUCOSE 140* 110* 114* 117* 131*  BUN 7 8 9 7 13   CREATININE 0.74 0.66 0.67 0.58* 0.78  CALCIUM 7.9* 7.7* 7.8* 8.1* 8.6*  MG  --   --  2.3 2.1 2.4   GFR: Estimated Creatinine Clearance: 89 mL/min (by C-G formula based on SCr of 0.78 mg/dL). Liver Function Tests: Recent Labs  Lab 07/30/23 0126 07/31/23 0218  AST 21 26  ALT 28 32  ALKPHOS 76 79  BILITOT  0.7 0.6  PROT 6.2* 6.1*  ALBUMIN 2.1* 1.9*   No results for input(s): "LIPASE", "AMYLASE" in the last 168 hours.  No results for input(s): "AMMONIA" in the last 168 hours. Coagulation Profile: No results for input(s): "INR", "PROTIME" in the last 168 hours. Cardiac Enzymes: No results for input(s): "CKTOTAL", "CKMB", "CKMBINDEX", "TROPONINI" in the last 168 hours. BNP (last 3 results) No results for input(s): "PROBNP" in the last 8760 hours. HbA1C: No results for input(s): "HGBA1C" in the last 72 hours. CBG: No results for input(s): "GLUCAP" in the last 168 hours. Lipid Profile: No results for input(s): "CHOL", "HDL", "LDLCALC", "TRIG", "CHOLHDL", "LDLDIRECT" in the last 72 hours. Thyroid Function Tests: No results for input(s): "TSH", "T4TOTAL", "FREET4", "T3FREE", "THYROIDAB" in the last 72 hours. Anemia Panel: No results for input(s): "VITAMINB12", "FOLATE", "FERRITIN", "TIBC", "IRON", "RETICCTPCT" in the last 72 hours. Sepsis Labs: No results for input(s): "PROCALCITON", "LATICACIDVEN" in the last 168 hours.   Recent Results (from the past 240 hour(s))  Aerobic/Anaerobic Culture w Gram Stain (surgical/deep wound)     Status: None   Collection Time: 07/25/23  2:01 PM   Specimen: PATH Cytology Peritoneal fluid; Body Fluid  Result Value Ref Range Status   Specimen Description FLUID PERITONEAL  Final   Special Requests SWABS  Final   Gram Stain   Final    ABUNDANT WBC PRESENT, PREDOMINANTLY PMN ABUNDANT GRAM POSITIVE COCCI ABUNDANT GRAM NEGATIVE RODS RARE GRAM POSITIVE RODS    Culture   Final    RARE ESCHERICHIA COLI MODERATE BACTEROIDES FRAGILIS BETA LACTAMASE POSITIVE Performed at [facility], [address], [place], [place] [address]    Report Status 07/28/2023 FINAL  Final   Organism ID, Bacteria ESCHERICHIA COLI  Final      Susceptibility   Escherichia coli - MIC*    AMPICILLIN 8 SENSITIVE Sensitive     CEFEPIME <=0.12 SENSITIVE Sensitive      CEFTAZIDIME <=1 SENSITIVE Sensitive     CEFTRIAXONE <=0.25 SENSITIVE Sensitive     CIPROFLOXACIN <=0.25 SENSITIVE Sensitive     GENTAMICIN <=1 SENSITIVE Sensitive     IMIPENEM <=0.25 SENSITIVE Sensitive     TRIMETH/SULFA <=20 SENSITIVE Sensitive     AMPICILLIN/SULBACTAM 4 SENSITIVE Sensitive     PIP/TAZO <=4 SENSITIVE Sensitive     * RARE ESCHERICHIA COLI  MRSA Next Gen by PCR, Nasal     Status: None   Collection Time: 08/01/23 12:45 PM   Specimen: Nasal Mucosa; Nasal Swab  Result Value Ref Range Status   MRSA by PCR Next Gen NOT DETECTED NOT DETECTED Final    Comment: (NOTE) The GeneXpert MRSA Assay (FDA approved for NASAL specimens only), is one component of a comprehensive MRSA colonization surveillance program. It is not intended to diagnose MRSA infection nor to guide or monitor treatment for MRSA infections. Test performance is not FDA approved in patients less than 56 years old. Performed at [facility], [address] [place], [place] [address]  Radiology Studies: No results found.      Scheduled Meds:  acetaminophen  1,000 mg Oral Q6H   amoxicillin-clavulanate  1 tablet Oral Q12H   enoxaparin (LOVENOX) injection  40 mg Subcutaneous Q24H   feeding supplement  237 mL Oral BID BM   methocarbamol  500 mg Oral Q6H   Continuous Infusions:  sodium chloride 50 mL/hr at 08/01/23 2225          Glade Lloyd, MD [facility] 08/02/2023, 7:54 AM '

## 2023-08-02 NOTE — Progress Notes (Signed)
General Surgery Follow Up Note  Subjective:    Overnight Issues:   Objective:  Vital signs for last 24 hours: Temp:  [97.6 F (36.4 C)-98.7 F (37.1 C)] 98.2 F (36.8 C) (08/10 0741) Pulse Rate:  [97-117] 97 (08/10 0741) Resp:  [16-20] 16 (08/10 0741) BP: (104-128)/(69-91) 113/80 (08/10 0741) SpO2:  [94 %-98 %] 94 % (08/10 0741) Weight:  [70.1 kg] 70.1 kg (08/10 0500)  Hemodynamic parameters for last 24 hours:    Intake/Output from previous day: 08/09 0701 - 08/10 0700 In: 1311.3 [P.O.:240; I.V.:1071.3] Out: 2625 [Urine:475; Drains:50; Stool:2100]  Intake/Output this shift: No intake/output data recorded.  Vent settings for last 24 hours:    Physical Exam:  Gen: comfortable, no distress Neuro: follows commands, alert, communicative HEENT: PERRL Neck: supple CV: RRR Pulm: unlabored breathing on RA Abd: soft, NT, midline wound with granulation tissue, JP purulent, ostomy PPP GU: urine clear and yellow, oliguric Extr: wwp, no edema  Results for orders placed or performed during the hospital encounter of 07/25/23 (from the past 24 hour(s))  MRSA Next Gen by PCR, Nasal     Status: None   Collection Time: 08/01/23 12:45 PM   Specimen: Nasal Mucosa; Nasal Swab  Result Value Ref Range   MRSA by PCR Next Gen NOT DETECTED NOT DETECTED  Basic metabolic panel     Status: Abnormal   Collection Time: 08/02/23  5:42 AM  Result Value Ref Range   Sodium 133 (L) 135 - 145 mmol/L   Potassium 5.1 3.5 - 5.1 mmol/L   Chloride 100 98 - 111 mmol/L   CO2 20 (L) 22 - 32 mmol/L   Glucose, Bld 131 (H) 70 - 99 mg/dL   BUN 13 6 - 20 mg/dL   Creatinine, Ser 0.10 0.61 - 1.24 mg/dL   Calcium 8.6 (L) 8.9 - 10.3 mg/dL   GFR, Estimated >93 >23 mL/min   Anion gap 13 5 - 15  Magnesium     Status: None   Collection Time: 08/02/23  5:42 AM  Result Value Ref Range   Magnesium 2.4 1.7 - 2.4 mg/dL  CBC with Differential/Platelet     Status: Abnormal   Collection Time: 08/02/23  5:42 AM   Result Value Ref Range   WBC 15.4 (H) 4.0 - 10.5 K/uL   RBC 4.79 4.22 - 5.81 MIL/uL   Hemoglobin 14.0 13.0 - 17.0 g/dL   HCT 55.7 32.2 - 02.5 %   MCV 87.9 80.0 - 100.0 fL   MCH 29.2 26.0 - 34.0 pg   MCHC 33.3 30.0 - 36.0 g/dL   RDW 42.7 06.2 - 37.6 %   Platelets 817 (H) 150 - 400 K/uL   nRBC 0.0 0.0 - 0.2 %   Neutrophils Relative % 82 %   Neutro Abs 12.8 (H) 1.7 - 7.7 K/uL   Lymphocytes Relative 10 %   Lymphs Abs 1.5 0.7 - 4.0 K/uL   Monocytes Relative 5 %   Monocytes Absolute 0.7 0.1 - 1.0 K/uL   Eosinophils Relative 1 %   Eosinophils Absolute 0.1 0.0 - 0.5 K/uL   Basophils Relative 0 %   Basophils Absolute 0.1 0.0 - 0.1 K/uL   Immature Granulocytes 2 %   Abs Immature Granulocytes 0.26 (H) 0.00 - 0.07 K/uL    Assessment & Plan: The plan of care was discussed with the bedside nurse for the day, who is in agreement with this plan and no additional concerns were raised.   Present on Admission:  Bowel perforation (HCC)  Pneumoperitoneum    LOS: 8 days   Additional comments:I reviewed the patient's new clinical lab test results.   and I reviewed the patients new imaging test results.    POD#6 s/p RIGHT COLECTOMY, DRAINAGE RETROPERITONEAL ABSCESS, 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 Janee Morn for perforated colitis with RTP abscess - CT 8/7 with a small intra-abdominal collection not amenable to percutaneous drainage - Continue Dakins BID to midline  - Continue Augmentin now and at discharge - will need prolonged course due to undrained small fluid collections on CT - WOC c/s for new ostomy. Appreciate ostomy charity care arrangements - continue JP  - continue abx   ID - zosyn 8/2 - 8/8, Augmentin 8/8 -  FEN - IVF, regular diet VTE - SCDs, lovenox Foley - out 8/3 and voiding   Uncontrolled HTN - per medicine Recent admit for elevated troponin, felt likely demand ischemia - left heart cath negative for CAD   Diamantina Monks, MD Trauma & General Surgery Please use [email or web address] to contact  on call provider  08/02/2023  *Care during the described time interval was provided by me. I have reviewed this patient's available data, including medical history, events of note, physical examination and test results as part of my evaluation.

## 2023-08-03 ENCOUNTER — Inpatient Hospital Stay (HOSPITAL_COMMUNITY): Payer: Self-pay

## 2023-08-03 LAB — BASIC METABOLIC PANEL
Anion gap: 13 (ref 5–15)
BUN: 19 mg/dL (ref 6–20)
CO2: 19 mmol/L — ABNORMAL LOW (ref 22–32)
Calcium: 8.4 mg/dL — ABNORMAL LOW (ref 8.9–10.3)
Chloride: 101 mmol/L (ref 98–111)
Creatinine, Ser: 0.74 mg/dL (ref 0.61–1.24)
GFR, Estimated: >60 mL/min (ref >60)
Glucose, Bld: 137 mg/dL — ABNORMAL HIGH (ref 70–99)
Potassium: 4.2 mmol/L (ref 3.5–5.1)
Sodium: 133 mmol/L — ABNORMAL LOW (ref 135–145)

## 2023-08-03 MED ORDER — IOHEXOL 350 MG/ML SOLN
75.0000 mL | Freq: Once | INTRAVENOUS | Status: AC | PRN
  Administered 2023-08-03: 75 mL via INTRAVENOUS

## 2023-08-03 MED ORDER — TAMSULOSIN HCL 0.4 MG PO CAPS
0.4000 mg | ORAL_CAPSULE | Freq: Every day | ORAL | Status: DC
  Administered 2023-08-03 – 2023-08-08 (×6): 0.4 mg via ORAL
  Filled 2023-08-03 (×6): qty 1

## 2023-08-03 NOTE — Progress Notes (Addendum)
Wound check requested. Family notes worsening purulent drainage and odor over the weekend. Also express concern that patient is being discharged.   He is noting more pain and nausea.   He is afebrile, HR mostly wnl but now 104, normotensive and saturating well on room air.  His WBC did increase to 15.4 yesterday and was not checked this morning.   Abdomen is soft, nontender. Wound as shown below with small dehiscence at superior portion, no exposed viscera; necrotic wound base and purulent exudate. JP output is purulent. Ostomy is viable with thin bilious output.   He had a CT 4 days ago showing a 5x2cm non-accessible pelvic abscess.    Plan: Damp to dry dressing changes with normal saline to midline wound. Change at least twice daily and any time the dressing becomes saturated. Apply abdominal binder at all times to reduce tension on the wound and decrease chances of further dehiscence.  Will repeat CT this evening to assess for drainable fluid collections, and check labs in AM including CBC.  I agree with his family that he is not safe for discharge at this time as this wound needs close attention and clinically he has taken a step back over the weekend.   ADDENDUM 18:18: I have reviewed his CT. Final read pending. Still has a small lower abdominal fluid collection but looks more anterior- will consult IR to see if this can be aspirated. NPO after midnight in case this is possible.     Berna Bue MD FACS 08/03/2023 5:43 PM

## 2023-08-03 NOTE — Progress Notes (Signed)
PROGRESS NOTE    Carl Briggs  KGM:[id number] DOB: 07-Mar-1971 DOA: 07/25/2023 PCP: Patient, No Pcp Per   Brief Narrative:  52 y.o. male, with history of hypertension, hyperlipidemia, anxiety disorder who was just discharged from the hospital on 07/24/2023 after he was treated for sepsis due to colitis presented with worsening abdominal pain.  On presentation, CT of the abdomen/pelvis showed contained perforation in the ascending colon.  He was started on IV antibiotic.  General surgery was consulted.  He underwent surgical intervention with right colectomy, drainage of retroperitoneal abscess and ileostomy on 07/25/23 by general surgery.  Care transferred to the general surgery service on 07/30/2023.  Assessment & Plan:   Perforated colitis with retroperitoneal abscess - underwent surgical intervention with right colectomy, drainage of retroperitoneal abscess and ileostomy on 07/25/23 by general surgery.  CT of abdomen and pelvis with contrast on 07/30/2023 showed possible pelvic abscess -Zosyn has been switched to oral Augmentin by general surgery. -Ostomy/wound care/pain management and diet as per general surgery recommendations. -Care transferred to the general surgery service on 07/30/2023.  Hyponatremia -Possibly from dehydration and poor oral intake.  Sodium 133 on 08/02/2023.  Monitor intermittently.  Off IV fluids.  Encourage oral intake.    Leukocytosis -WBCs bumped up to 15.4 on 08/02/2023.  Management as per primary surgical team  Thrombocytosis -Possibly reactive  Hypertension -Blood pressure on the lower side.  Lisinopril on hold  Anxiety -Lexapro on hold  Elevated troponin during previous admission -Recently was seen by cardiology, underwent cardiac catheterization on 07/23/2023 with normal coronaries   Subjective: Patient seen and examined at bedside.  No fever or worsening shortness of breath reported.  Having intermittent abdominal pain with nausea. objective: Vitals:    08/02/23 2354 08/03/23 0400 08/03/23 0500 08/03/23 0921  BP: 119/85 115/89  117/88  Pulse: 94 94  89  Resp: 15 18  16   Temp: 97.7 F (36.5 C) 98 F (36.7 C)  98.2 F (36.8 C)  TempSrc:  Oral  Oral  SpO2: 96%   93%  Weight:   70.1 kg     Intake/Output Summary (Last 24 hours) at 08/03/2023 1025 Last data filed at 08/03/2023 0600 Gross per 24 hour  Intake 240 ml  Output 1518 ml  Net -1278 ml   Filed Weights   08/01/23 0624 08/02/23 0500 08/03/23 0500  Weight: 70.1 kg 70.1 kg 70.1 kg    Examination:  General: No acute distress.  On room air.  Looks chronically ill and deconditioned. respiratory: Bilateral decreased breath sounds at bases with scattered crackles CVS: S1 and S2 are heard; rate controlled currently  abdominal: Soft, distended and tender; no organomegaly; bowel sounds are heard.  Dressing/JP drain/ostomy present extremities: No cyanosis; trace lower extremity edema present    Data Reviewed: I have personally reviewed following labs and imaging studies  CBC: Recent Labs  Lab 07/30/23 0126 07/31/23 0218 08/01/23 0554 08/02/23 0542  WBC 16.3* 13.2* 10.9* 15.4*  NEUTROABS  --  10.9* 8.7* 12.8*  HGB 11.6* 11.0* 11.5* 14.0  HCT 34.4* 32.7* 33.8* 42.1  MCV 89.8 87.0 88.7 87.9  PLT 528* 620* 658* 817*   Basic Metabolic Panel: Recent Labs  Lab 07/30/23 0126 07/31/23 0218 08/01/23 0554 08/02/23 0542  NA 125* 132* 132* 133*  K 4.7 4.0 4.0 5.1  CL 94* 100 97* 100  CO2 20* 24 23 20*  GLUCOSE 110* 114* 117* 131*  BUN 8 9 7 13   CREATININE 0.66 0.67 0.58* 0.78  CALCIUM 7.7*  7.8* 8.1* 8.6*  MG  --  2.3 2.1 2.4   GFR: Estimated Creatinine Clearance: 89 mL/min (by C-G formula based on SCr of 0.78 mg/dL). Liver Function Tests: Recent Labs  Lab 07/30/23 0126 07/31/23 0218  AST 21 26  ALT 28 32  ALKPHOS 76 79  BILITOT 0.7 0.6  PROT 6.2* 6.1*  ALBUMIN 2.1* 1.9*   No results for input(s): "LIPASE", "AMYLASE" in the last 168 hours.  No results for  input(s): "AMMONIA" in the last 168 hours. Coagulation Profile: No results for input(s): "INR", "PROTIME" in the last 168 hours. Cardiac Enzymes: No results for input(s): "CKTOTAL", "CKMB", "CKMBINDEX", "TROPONINI" in the last 168 hours. BNP (last 3 results) No results for input(s): "PROBNP" in the last 8760 hours. HbA1C: No results for input(s): "HGBA1C" in the last 72 hours. CBG: No results for input(s): "GLUCAP" in the last 168 hours. Lipid Profile: No results for input(s): "CHOL", "HDL", "LDLCALC", "TRIG", "CHOLHDL", "LDLDIRECT" in the last 72 hours. Thyroid Function Tests: No results for input(s): "TSH", "T4TOTAL", "FREET4", "T3FREE", "THYROIDAB" in the last 72 hours. Anemia Panel: No results for input(s): "VITAMINB12", "FOLATE", "FERRITIN", "TIBC", "IRON", "RETICCTPCT" in the last 72 hours. Sepsis Labs: No results for input(s): "PROCALCITON", "LATICACIDVEN" in the last 168 hours.   Recent Results (from the past 240 hour(s))  Aerobic/Anaerobic Culture w Gram Stain (surgical/deep wound)     Status: None   Collection Time: 07/25/23  2:01 PM   Specimen: PATH Cytology Peritoneal fluid; Body Fluid  Result Value Ref Range Status   Specimen Description FLUID PERITONEAL  Final   Special Requests SWABS  Final   Gram Stain   Final    ABUNDANT WBC PRESENT, PREDOMINANTLY PMN ABUNDANT GRAM POSITIVE COCCI ABUNDANT GRAM NEGATIVE RODS RARE GRAM POSITIVE RODS    Culture   Final    RARE ESCHERICHIA COLI MODERATE BACTEROIDES FRAGILIS BETA LACTAMASE POSITIVE Performed at [facility], [address] [place], [place] [address]    Report Status 07/28/2023 FINAL  Final   Organism ID, Bacteria ESCHERICHIA COLI  Final      Susceptibility   Escherichia coli - MIC*    AMPICILLIN 8 SENSITIVE Sensitive     CEFEPIME <=0.12 SENSITIVE Sensitive     CEFTAZIDIME <=1 SENSITIVE Sensitive     CEFTRIAXONE <=0.25 SENSITIVE Sensitive     CIPROFLOXACIN <=0.25 SENSITIVE Sensitive     GENTAMICIN  <=1 SENSITIVE Sensitive     IMIPENEM <=0.25 SENSITIVE Sensitive     TRIMETH/SULFA <=20 SENSITIVE Sensitive     AMPICILLIN/SULBACTAM 4 SENSITIVE Sensitive     PIP/TAZO <=4 SENSITIVE Sensitive     * RARE ESCHERICHIA COLI  MRSA Next Gen by PCR, Nasal     Status: None   Collection Time: 08/01/23 12:45 PM   Specimen: Nasal Mucosa; Nasal Swab  Result Value Ref Range Status   MRSA by PCR Next Gen NOT DETECTED NOT DETECTED Final    Comment: (NOTE) The GeneXpert MRSA Assay (FDA approved for NASAL specimens only), is one component of a comprehensive MRSA colonization surveillance program. It is not intended to diagnose MRSA infection nor to guide or monitor treatment for MRSA infections. Test performance is not FDA approved in patients less than 46 years old. Performed at [facility], [address] [place], [place] [address]          Radiology Studies: No results found.      Scheduled Meds:  acetaminophen  1,000 mg Oral Q6H   amoxicillin-clavulanate  1  tablet Oral Q12H   enoxaparin (LOVENOX) injection  40 mg Subcutaneous Q24H   feeding supplement  237 mL Oral BID BM   ibuprofen  600 mg Oral QID   methocarbamol  1,000 mg Oral Q6H   tamsulosin  0.4 mg Oral Daily   Continuous Infusions:          Glade Lloyd, MD [facility] 08/03/2023, 10:25 AM '

## 2023-08-03 NOTE — Progress Notes (Signed)
   Trauma/Critical Care Follow Up Note  Subjective:    Overnight Issues:   Objective:  Vital signs for last 24 hours: Temp:  [97.5 F (36.4 C)-98.4 F (36.9 C)] 98.2 F (36.8 C) (08/11 0921) Pulse Rate:  [89-99] 89 (08/11 0921) Resp:  [15-18] 16 (08/11 0921) BP: (114-122)/(81-89) 117/88 (08/11 0921) SpO2:  [93 %-97 %] 93 % (08/11 0921) Weight:  [70.1 kg] 70.1 kg (08/11 0500)  Hemodynamic parameters for last 24 hours:    Intake/Output from previous day: 08/10 0701 - 08/11 0700 In: 240 [P.O.:240] Out: 1518 [Urine:275; Drains:18; Stool:1225]  Intake/Output this shift: No intake/output data recorded.  Vent settings for last 24 hours:    Physical Exam:  Gen: comfortable, no distress Neuro: follows commands, alert, communicative HEENT: PERRL Neck: supple CV: RRR Pulm: unlabored breathing on RA Abd: soft, NT    GU: urine clear and yellow, +Foley Extr: wwp, no edema  No results found for this or any previous visit (from the past 24 hour(s)).  Assessment & Plan: The plan of care was discussed with the bedside nurse for the day, who is in agreement with this plan and no additional concerns were raised.   Present on Admission:  Bowel perforation (HCC)  Pneumoperitoneum    LOS: 9 days   Additional comments:I reviewed the patient's new clinical lab test results.   and I reviewed the patients new imaging test results.    POD#7 s/p RIGHT COLECTOMY, DRAINAGE RETROPERITONEAL ABSCESS, 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 Janee Morn for perforated colitis with RTP abscess - CT 8/7 with a small intra-abdominal collection not amenable to percutaneous drainage - Continue Dakins BID to midline  - Continue Augmentin now and at discharge - will need prolonged course due to undrained small fluid collections on CT - WOC c/s for new ostomy. Appreciate ostomy charity care arrangements - continue JP  - continue abx   ID - zosyn 8/2 - 8/8, Augmentin 8/8 -  FEN - IVF, regular diet VTE - SCDs,  lovenox Foley - out 8/3 and voiding   Uncontrolled HTN - per medicine Recent admit for elevated troponin, felt likely demand ischemia - left heart cath negative for CAD   Stable for discharge.   Diamantina Monks, MD Trauma & General Surgery Please use [email or web address] to contact on call provider  08/03/2023  *Care during the described time interval was provided by me. I have reviewed this patient's available data, including medical history, events of note, physical examination and test results as part of my evaluation.

## 2023-08-03 NOTE — Consult Note (Signed)
WOC Nurse ostomy consult note Consult received for new ostomy teaching. Patient has been seen for teaching over several sessions. Plan to see again Monday, 08/04/23.  WOC nursing team will follow, and will remain available to this patient, the nursing and medical teams.    Ladona Mow, MSN, RN, CNS, GNP, Leda Min, [employer], [employer] phone:  [phone]

## 2023-08-04 ENCOUNTER — Encounter (HOSPITAL_COMMUNITY): Payer: Self-pay | Admitting: Family Medicine

## 2023-08-04 ENCOUNTER — Inpatient Hospital Stay (HOSPITAL_COMMUNITY): Payer: Self-pay

## 2023-08-04 LAB — PROTIME-INR
INR: 1.1 (ref 0.8–1.2)
Prothrombin Time: 14.8 seconds (ref 11.4–15.2)

## 2023-08-04 MED ORDER — FENTANYL CITRATE (PF) 100 MCG/2ML IJ SOLN
INTRAMUSCULAR | Status: AC
  Filled 2023-08-04: qty 2

## 2023-08-04 MED ORDER — FENTANYL CITRATE (PF) 100 MCG/2ML IJ SOLN
INTRAMUSCULAR | Status: AC | PRN
  Administered 2023-08-04: 25 ug via INTRAVENOUS

## 2023-08-04 MED ORDER — MIDAZOLAM HCL 2 MG/2ML IJ SOLN
INTRAMUSCULAR | Status: AC
  Filled 2023-08-04: qty 2

## 2023-08-04 NOTE — Progress Notes (Signed)
PROGRESS NOTE    Giacomo Ilsley  ZOX:[id number] DOB: 1971-12-07 DOA: 07/25/2023 PCP: Patient, No Pcp Per   Brief Narrative:  52 y.o. male, with history of hypertension, hyperlipidemia, anxiety disorder who was just discharged from the hospital on 07/24/2023 after he was treated for sepsis due to colitis presented with worsening abdominal pain.  On presentation, CT of the abdomen/pelvis showed contained perforation in the ascending colon.  He was started on IV antibiotic.  General surgery was consulted.  He underwent surgical intervention with right colectomy, drainage of retroperitoneal abscess and ileostomy on 07/25/23 by general surgery.  Care transferred to the general surgery service on 07/30/2023.  Assessment & Plan:   Perforated colitis with retroperitoneal abscess - underwent surgical intervention with right colectomy, drainage of retroperitoneal abscess and ileostomy on 07/25/23 by general surgery.  CT of abdomen and pelvis repeated on 08/03/2023 showed possible postoperative pelvic abscess -Zosyn has been switched to oral Augmentin by general surgery. -Ostomy/wound care/pain management and diet as per general surgery recommendations. -Care transferred to the general surgery service on 07/30/2023.  Hyponatremia -Possibly from dehydration and poor oral intake.  Sodium 130 today.  Monitor intermittently.  Off IV fluids.  Encourage oral intake.    Leukocytosis -Resolved  Thrombocytosis -Possibly reactive  Hypertension -Blood pressure on the lower side.  Lisinopril on hold  Anxiety -Lexapro on hold  Elevated troponin during previous admission -Recently was seen by cardiology, underwent cardiac catheterization on 07/23/2023 with normal coronaries   Subjective: Patient seen and examined at bedside.  Still having intermittent abdominal pain and with nausea.  No fever or shortness of breath reported.   Objective: Vitals:   08/03/23 1912 08/03/23 2311 08/04/23 0323 08/04/23 0750  BP:  113/72 103/73 99/76 101/71  Pulse: (!) 105 97 81 78  Resp: 15 18 16 15   Temp: 98.2 F (36.8 C) 98.2 F (36.8 C) 98 F (36.7 C) 97.6 F (36.4 C)  TempSrc: Oral Oral Oral Oral  SpO2: 92% 93% 95% 96%  Weight:   67.4 kg     Intake/Output Summary (Last 24 hours) at 08/04/2023 0759 Last data filed at 08/04/2023 8119 Gross per 24 hour  Intake 240 ml  Output 1155 ml  Net -915 ml   Filed Weights   08/02/23 0500 08/03/23 0500 08/04/23 0323  Weight: 70.1 kg 70.1 kg 67.4 kg    Examination:  General: Currently on room air.  No distress.  Looks chronically ill and deconditioned. respiratory: Decreased breath sounds at bases bilaterally with some crackles CVS: Mostly rate controlled; S1 and S2 heard  abdominal: Soft, mildly tender and distended; no organomegaly; normal bowel sounds heard.  Dressing/JP drain/ostomy present extremities: Mild lower extremity edema present; no clubbing   Data Reviewed: I have personally reviewed following labs and imaging studies  CBC: Recent Labs  Lab 07/30/23 0126 07/31/23 0218 08/01/23 0554 08/02/23 0542 08/04/23 0110  WBC 16.3* 13.2* 10.9* 15.4* 10.4  NEUTROABS  --  10.9* 8.7* 12.8*  --   HGB 11.6* 11.0* 11.5* 14.0 11.6*  HCT 34.4* 32.7* 33.8* 42.1 34.6*  MCV 89.8 87.0 88.7 87.9 89.4  PLT 528* 620* 658* 817* 715*   Basic Metabolic Panel: Recent Labs  Lab 07/31/23 0218 08/01/23 0554 08/02/23 0542 08/03/23 1040 08/04/23 0110  NA 132* 132* 133* 133* 130*  K 4.0 4.0 5.1 4.2 4.2  CL 100 97* 100 101 102  CO2 24 23 20* 19* 19*  GLUCOSE 114* 117* 131* 137* 97  BUN 9 7 13  19  17  CREATININE 0.67 0.58* 0.78 0.74 0.75  CALCIUM 7.8* 8.1* 8.6* 8.4* 8.1*  MG 2.3 2.1 2.4  --  2.0   GFR: Estimated Creatinine Clearance: 87.3 mL/min (by C-G formula based on SCr of 0.75 mg/dL). Liver Function Tests: Recent Labs  Lab 07/30/23 0126 07/31/23 0218  AST 21 26  ALT 28 32  ALKPHOS 76 79  BILITOT 0.7 0.6  PROT 6.2* 6.1*  ALBUMIN 2.1* 1.9*   No  results for input(s): "LIPASE", "AMYLASE" in the last 168 hours.  No results for input(s): "AMMONIA" in the last 168 hours. Coagulation Profile: No results for input(s): "INR", "PROTIME" in the last 168 hours. Cardiac Enzymes: No results for input(s): "CKTOTAL", "CKMB", "CKMBINDEX", "TROPONINI" in the last 168 hours. BNP (last 3 results) No results for input(s): "PROBNP" in the last 8760 hours. HbA1C: No results for input(s): "HGBA1C" in the last 72 hours. CBG: No results for input(s): "GLUCAP" in the last 168 hours. Lipid Profile: No results for input(s): "CHOL", "HDL", "LDLCALC", "TRIG", "CHOLHDL", "LDLDIRECT" in the last 72 hours. Thyroid Function Tests: No results for input(s): "TSH", "T4TOTAL", "FREET4", "T3FREE", "THYROIDAB" in the last 72 hours. Anemia Panel: No results for input(s): "VITAMINB12", "FOLATE", "FERRITIN", "TIBC", "IRON", "RETICCTPCT" in the last 72 hours. Sepsis Labs: No results for input(s): "PROCALCITON", "LATICACIDVEN" in the last 168 hours.   Recent Results (from the past 240 hour(s))  Aerobic/Anaerobic Culture w Gram Stain (surgical/deep wound)     Status: None   Collection Time: 07/25/23  2:01 PM   Specimen: PATH Cytology Peritoneal fluid; Body Fluid  Result Value Ref Range Status   Specimen Description FLUID PERITONEAL  Final   Special Requests SWABS  Final   Gram Stain   Final    ABUNDANT WBC PRESENT, PREDOMINANTLY PMN ABUNDANT GRAM POSITIVE COCCI ABUNDANT GRAM NEGATIVE RODS RARE GRAM POSITIVE RODS    Culture   Final    RARE ESCHERICHIA COLI MODERATE BACTEROIDES FRAGILIS BETA LACTAMASE POSITIVE Performed at [facility], [address], [place], [place] [address]    Report Status 07/28/2023 FINAL  Final   Organism ID, Bacteria ESCHERICHIA COLI  Final      Susceptibility   Escherichia coli - MIC*    AMPICILLIN 8 SENSITIVE Sensitive     CEFEPIME <=0.12 SENSITIVE Sensitive     CEFTAZIDIME <=1 SENSITIVE Sensitive     CEFTRIAXONE <=0.25  SENSITIVE Sensitive     CIPROFLOXACIN <=0.25 SENSITIVE Sensitive     GENTAMICIN <=1 SENSITIVE Sensitive     IMIPENEM <=0.25 SENSITIVE Sensitive     TRIMETH/SULFA <=20 SENSITIVE Sensitive     AMPICILLIN/SULBACTAM 4 SENSITIVE Sensitive     PIP/TAZO <=4 SENSITIVE Sensitive     * RARE ESCHERICHIA COLI  MRSA Next Gen by PCR, Nasal     Status: None   Collection Time: 08/01/23 12:45 PM   Specimen: Nasal Mucosa; Nasal Swab  Result Value Ref Range Status   MRSA by PCR Next Gen NOT DETECTED NOT DETECTED Final    Comment: (NOTE) The GeneXpert MRSA Assay (FDA approved for NASAL specimens only), is one component of a comprehensive MRSA colonization surveillance program. It is not intended to diagnose MRSA infection nor to guide or monitor treatment for MRSA infections. Test performance is not FDA approved in patients less than 23 years old. Performed at [facility], [address] [place], [place] [address]          Radiology Studies: CT ABDOMEN PELVIS W CONTRAST  Result Date: 08/03/2023  CLINICAL DATA:  Follow-up intra-abdominal abscess EXAM: CT ABDOMEN AND PELVIS WITH CONTRAST TECHNIQUE: Multidetector CT imaging of the abdomen and pelvis was performed using the standard protocol following bolus administration of intravenous contrast. RADIATION DOSE REDUCTION: This exam was performed according to the departmental dose-optimization program which includes automated exposure control, adjustment of the mA and/or kV according to patient size and/or use of iterative reconstruction technique. CONTRAST:  75mL OMNIPAQUE IOHEXOL 350 MG/ML SOLN COMPARISON:  07/30/2023 FINDINGS: Lower chest: Mild bibasilar atelectatic changes are noted. No sizable effusion is seen. Previously seen calcified granuloma is again noted in the lingula. Hepatobiliary: Fatty infiltration of the liver is noted. The gallbladder demonstrates a few dependent gallstones. No wall thickening or pericholecystic fluid is noted.  Pancreas: Unremarkable. No pancreatic ductal dilatation or surrounding inflammatory changes. Spleen: Normal in size without focal abnormality. Adrenals/Urinary Tract: Adrenal glands are within normal limits. Kidneys are well visualized bilaterally within normal enhancement pattern. Stable simple appearing cyst in the right kidney is noted. No follow-up is recommended. The bladder is partially distended. Stomach/Bowel: Diverticular change of the colon is noted. The colon is predominately decompressed. Changes of prior right colectomy are noted. 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 No obstructive changes in the small bowel are seen. Stomach is distended with ingested food stuffs. Previously seen fluid collection within the pelvis superior to the bladder is again identified and now measures approximately 5.2 x 3.4 cm. This is a slight increase in the AP dimension when compared with the prior exam. Additionally it appears to inter communicate with a smaller collection superiorly. Stable fluid surrounding the hepatic tip is noted. No other fluid collection is identified. Vascular/Lymphatic: Aortic atherosclerosis. No enlarged abdominal or pelvic lymph nodes. Reproductive: Prostate is unremarkable. Other: Postsurgical changes are noted in the anterior abdominal wall. Large open wound remains. Surgical drain remains in place. Musculoskeletal: No acute or significant osseous findings. IMPRESSION: Postsurgical changes consistent with the given clinical history. Persistent and slightly enlarged fluid collection is noted deep in the pelvis just superior to the bladder. This is again consistent with postoperative abscess. It appears to communicate with a few smaller collections. No new postoperative fluid collection is identified. Cholelithiasis without complicating factors. Electronically Signed   By: Alcide Clever M.D.   On: 08/03/2023 20:47        Scheduled Meds:  acetaminophen  1,000 mg Oral Q6H   amoxicillin-clavulanate  1  tablet Oral Q12H   enoxaparin (LOVENOX) injection  40 mg Subcutaneous Q24H   feeding supplement  237 mL Oral BID BM   ibuprofen  600 mg Oral QID   methocarbamol  1,000 mg Oral Q6H   tamsulosin  0.4 mg Oral Daily   Continuous Infusions:          Glade Lloyd, MD [facility] 08/04/2023, 7:59 AM '

## 2023-08-04 NOTE — Consult Note (Signed)
Chief Complaint: Patient was seen in consultation today for intra-abdominal fluid collection  Referring Physician(s): Dr. Phylliss Blakes  Supervising Physician: Mir, Mauri Reading  Patient Status: [facility] - In-pt  History of Present Illness: Carl [facility] is a 52 y.o. male who presented to ED [facility] with abdominal pain/bloating 7/28-7/30.  He was found to have sepsis related to colitis with leukocytosis and fevers.  He was discharged home 7/30, however returned to [facility] ED 8/2 with worsening pain. CT at that time showed worsening colitis with contained perforation.  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  Post-operatively he had a small, deep, simple-appearing fluid collection.  IR was consulted for aspiration vs. Drainage, however collection was deemed inaccessible.  Patient continues with inpatient management in the post-operative course.  He now has dishencense of his abdominal incision with puruelent drainage.  WOC team has been consulted.  Repeat imaging showed slight enlargement of his intra-abdominal fluid collection.  IR consulted for aspiration and drainage.   Past Medical History:  Diagnosis Date   Anxiety    High cholesterol    Hypertension     Past Surgical History:  Procedure Laterality Date   COLECTOMY N/A 07/25/2023   Procedure: OPEN  RIGHT COLECTOMY;  Surgeon: Violeta Gelinas, MD;  Location: [facility] OR;  Service: General;  Laterality: N/A;   COLONOSCOPY WITH PROPOFOL N/A 11/09/2018   Procedure: COLONOSCOPY WITH PROPOFOL;  Surgeon: Willis Modena, MD;  Location: [facility] ENDOSCOPY;  Service: Endoscopy;  Laterality: N/A;   LEFT HEART CATH AND CORONARY ANGIOGRAPHY N/A 07/23/2023   Procedure: LEFT HEART CATH AND CORONARY ANGIOGRAPHY;  Surgeon: Kathleene Hazel, MD;  Location: [facility] INVASIVE CV LAB;  Service: Cardiovascular;  Laterality: N/A;    Allergies: Patient has no known allergies.  Medications: Prior to Admission medications   Medication Sig  Start Date End Date Taking? Authorizing Provider  aspirin EC 81 MG tablet Take 1 tablet (81 mg total) by mouth daily. Swallow whole. 07/24/23  Yes Pokhrel, Laxman, MD  dicyclomine (BENTYL) 20 MG tablet Take 1 tablet (20 mg total) by mouth 4 (four) times daily -  before meals and at bedtime for 5 days. 07/23/23 07/28/23 Yes Pokhrel, Laxman, MD  docusate sodium (COLACE) 100 MG capsule Take 1 capsule (100 mg total) by mouth 2 (two) times daily. Patient taking differently: Take 100 mg by mouth 2 (two) times daily as needed (constipation). 07/23/23  Yes Pokhrel, Laxman, MD  escitalopram (LEXAPRO) 10 MG tablet Take 1 tablet (10 mg total) by mouth daily. 07/23/23 10/21/23 Yes Pokhrel, Rebekah Chesterfield, MD  folic acid (FOLVITE) 1 MG tablet Take 1 tablet (1 mg total) by mouth daily. 07/23/23 10/21/23 Yes Pokhrel, Rebekah Chesterfield, MD  hydrochlorothiazide (HYDRODIURIL) 12.5 MG tablet Take 1 tablet (12.5 mg total) by mouth every morning. 07/23/23 07/22/24 Yes Pokhrel, Laxman, MD  ibuprofen (ADVIL) 200 MG tablet Take 400 mg by mouth every 6 (six) hours as needed for headache or moderate pain.   Yes [provider]  lisinopril (ZESTRIL) 20 MG tablet Take 1 tablet (20 mg total) by mouth at bedtime. 07/23/23 07/22/24 Yes Pokhrel, Laxman, MD  metoprolol tartrate (LOPRESSOR) 25 MG tablet Take 1 tablet (25 mg total) by mouth 2 (two) times daily. 07/23/23 07/22/24 Yes Pokhrel, Laxman, MD  pantoprazole (PROTONIX) 40 MG tablet Take 1 tablet (40 mg total) by mouth daily. 07/23/23 10/21/23 Yes Pokhrel, Laxman, MD  potassium chloride SA (KLOR-CON M) 20 MEQ tablet Take 1 tablet (20 mEq total) by mouth daily for 5 days. 07/23/23 07/28/23  Yes Pokhrel, Laxman, MD  Sodium Bicarbonate-Citric Acid (ALKA-SELTZER PO) Take 2 tablets by mouth 2 (two) times daily as needed (stomach upset).   Yes [provider]  tamsulosin (FLOMAX) 0.4 MG CAPS capsule Take 1 capsule (0.4 mg total) by mouth daily. 07/23/23 10/21/23 Yes Pokhrel, Laxman, MD  TYLENOL 500 MG  tablet Take 500-1,000 mg by mouth every 6 (six) hours as needed for mild pain or headache.   Yes [provider]  thiamine (VITAMIN B1) 100 MG tablet Take 1 tablet (100 mg total) by mouth daily. Patient not taking: Reported on 07/26/2023 07/23/23 10/21/23  Joycelyn Das, MD     Family History  Problem Relation Age of Onset   Diabetes Mother     Social History   Socioeconomic History   Marital status: Married    Spouse name: Not on file   Number of children: Not on file   Years of education: Not on file   Highest education level: Not on file  Occupational History   Not on file  Tobacco Use   Smoking status: Never   Smokeless tobacco: Never  Vaping Use   Vaping status: Never Used  Substance and Sexual Activity   Alcohol use: Not Currently   Drug use: Not on file   Sexual activity: Yes  Other Topics Concern   Not on file  Social History Narrative   Not on file   Social Determinants of Health   Financial Resource Strain: Not on file  Food Insecurity: No Food Insecurity (07/20/2023)   Hunger Vital Sign    Worried About Running Out of Food in the Last Year: Never true    Ran Out of Food in the Last Year: Never true  Transportation Needs: No Transportation Needs (07/20/2023)   PRAPARE - Administrator, Civil Service (Medical): No    Lack of Transportation (Non-Medical): No  Physical Activity: Not on file  Stress: Not on file  Social Connections: Not on file     Review of Systems: A 12 point ROS discussed and pertinent positives are indicated in the HPI above.  All other systems are negative.  Review of Systems  Constitutional:  Positive for fatigue. Negative for fever.  Cardiovascular:  Negative for chest pain.  Gastrointestinal:  Positive for abdominal distention, abdominal pain and nausea.  Musculoskeletal:  Negative for back pain.  Psychiatric/Behavioral:  Negative for behavioral problems and confusion.     Vital Signs: BP 101/71 (BP  Location: Right Arm)   Pulse 78   Temp 97.6 F (36.4 C) (Oral)   Resp 15   Wt 148 lb 9.4 oz (67.4 kg)   SpO2 96%   BMI 28.42 kg/m   Physical Exam Vitals and nursing note reviewed.  Constitutional:      General: He is not in acute distress.    Appearance: He is well-developed. He is not ill-appearing.  Cardiovascular:     Rate and Rhythm: Normal rate and regular rhythm.  Pulmonary:     Effort: Pulmonary effort is normal.     Breath sounds: Normal breath sounds.  Abdominal:     General: There is distension.     Tenderness: There is generalized abdominal tenderness.     Comments: Large midline incision with dehiscence  Skin:    General: Skin is warm and dry.  Neurological:     General: No focal deficit present.     Mental Status: He is alert and oriented to person, place, and time.  Psychiatric:  Mood and Affect: Mood normal.        Behavior: Behavior normal.      MD Evaluation Airway: WNL Heart: WNL Abdomen: WNL Chest/ Lungs: WNL ASA  Classification: 3 Mallampati/Airway Score: Two   Imaging: CT ABDOMEN PELVIS W CONTRAST  Result Date: 08/03/2023 CLINICAL DATA:  Follow-up intra-abdominal abscess EXAM: CT ABDOMEN AND PELVIS WITH CONTRAST TECHNIQUE: Multidetector CT imaging of the abdomen and pelvis was performed using the standard protocol following bolus administration of intravenous contrast. RADIATION DOSE REDUCTION: This exam was performed according to the departmental dose-optimization program which includes automated exposure control, adjustment of the mA and/or kV according to patient size and/or use of iterative reconstruction technique. CONTRAST:  75mL OMNIPAQUE IOHEXOL 350 MG/ML SOLN COMPARISON:  07/30/2023 FINDINGS: Lower chest: Mild bibasilar atelectatic changes are noted. No sizable effusion is seen. Previously seen calcified granuloma is again noted in the lingula. Hepatobiliary: Fatty infiltration of the liver is noted. The gallbladder demonstrates a  few dependent gallstones. No wall thickening or pericholecystic fluid is noted. Pancreas: Unremarkable. No pancreatic ductal dilatation or surrounding inflammatory changes. Spleen: Normal in size without focal abnormality. Adrenals/Urinary Tract: Adrenal glands are within normal limits. Kidneys are well visualized bilaterally within normal enhancement pattern. Stable simple appearing cyst in the right kidney is noted. No follow-up is recommended. The bladder is partially distended. Stomach/Bowel: Diverticular change of the colon is noted. The colon is predominately decompressed. Changes of prior right colectomy are noted. 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 No obstructive changes in the small bowel are seen. Stomach is distended with ingested food stuffs. Previously seen fluid collection within the pelvis superior to the bladder is again identified and now measures approximately 5.2 x 3.4 cm. This is a slight increase in the AP dimension when compared with the prior exam. Additionally it appears to inter communicate with a smaller collection superiorly. Stable fluid surrounding the hepatic tip is noted. No other fluid collection is identified. Vascular/Lymphatic: Aortic atherosclerosis. No enlarged abdominal or pelvic lymph nodes. Reproductive: Prostate is unremarkable. Other: Postsurgical changes are noted in the anterior abdominal wall. Large open wound remains. Surgical drain remains in place. Musculoskeletal: No acute or significant osseous findings. IMPRESSION: Postsurgical changes consistent with the given clinical history. Persistent and slightly enlarged fluid collection is noted deep in the pelvis just superior to the bladder. This is again consistent with postoperative abscess. It appears to communicate with a few smaller collections. No new postoperative fluid collection is identified. Cholelithiasis without complicating factors. Electronically Signed   By: Alcide Clever M.D.   On: 08/03/2023 20:47   CT ABDOMEN  PELVIS W CONTRAST  Result Date: 07/30/2023 CLINICAL DATA:  Postoperative abdominal pain. EXAM: CT ABDOMEN AND PELVIS WITH CONTRAST TECHNIQUE: Multidetector CT imaging of the abdomen and pelvis was performed using the standard protocol following bolus administration of intravenous contrast. RADIATION DOSE REDUCTION: This exam was performed according to the departmental dose-optimization program which includes automated exposure control, adjustment of the mA and/or kV according to patient size and/or use of iterative reconstruction technique. CONTRAST:  75mL OMNIPAQUE IOHEXOL 350 MG/ML SOLN COMPARISON:  July 25, 2023. FINDINGS: Lower chest: Mild bibasilar subsegmental atelectasis is noted. Hepatobiliary: Cholelithiasis. No biliary dilatation is noted. Probable small peripheral right hepatic cyst. Pancreas: Unremarkable. No pancreatic ductal dilatation or surrounding inflammatory changes. Spleen: Normal in size without focal abnormality. Adrenals/Urinary Tract: Adrenal glands appear normal. Right renal cyst is noted for which no further follow-up is required. No hydronephrosis or renal obstruction is noted. Urinary bladder is  unremarkable. Stomach/Bowel: Stomach is unremarkable. Status post right hemicolectomy. Surgical drain is seen in right side of abdomen in surgical bed. Minimal amount of free fluid is noted in this area which most likely is postoperative. Midline surgical wound is noted. No abnormal bowel dilatation is noted. Vascular/Lymphatic: No significant vascular findings are present. No enlarged abdominal or pelvic lymph nodes. Reproductive: Prostate is unremarkable. Other: As noted above, midline surgical wound is noted with small amount of free air seen anteriorly in the midline of the abdomen consistent with recent postoperative status. 5.2 x 2.0 cm fluid collection with enhancing wall is noted in the pelvis centrally which could represent abscess. Musculoskeletal: No acute or significant osseous  findings. IMPRESSION: Status post right hemicolectomy with surgical drain seen in right side of the abdomen and minimal free fluid in this area consistent with expected postoperative change. However, 5.2 x 2.0 cm fluid collection with enhancing wall is noted centrally in the pelvis which could represent abscess. Cholelithiasis. Electronically Signed   By: Lupita Raider M.D.   On: 07/30/2023 13:39   DG Abd 1 View  Result Date: 07/25/2023 CLINICAL DATA:  Encounter for nasogastric tube placement. EXAM: ABDOMEN - 1 VIEW COMPARISON:  CT abdomen and pelvis 07/25/2023 FINDINGS: Nasogastric tube extends into the abdomen. The tip is overlying the right side of the abdomen likely in the distal stomach or proximal duodenal region. Patchy densities at the lung bases may represent atelectasis. Nonobstructive bowel gas pattern in the abdomen. IMPRESSION: Nasogastric tube tip is overlying the right side of the abdomen, likely in the distal stomach region. Electronically Signed   By: Richarda Overlie M.D.   On: 07/25/2023 16:09   CT ABDOMEN PELVIS W CONTRAST  Result Date: 07/25/2023 CLINICAL DATA:  Right lower quadrant pain. Recent diagnosis of colitis. EXAM: CT ABDOMEN AND PELVIS WITH CONTRAST TECHNIQUE: Multidetector CT imaging of the abdomen and pelvis was performed using the standard protocol following bolus administration of intravenous contrast. RADIATION DOSE REDUCTION: This exam was performed according to the departmental dose-optimization program which includes automated exposure control, adjustment of the mA and/or kV according to patient size and/or use of iterative reconstruction technique. CONTRAST:  75mL OMNIPAQUE IOHEXOL 350 MG/ML SOLN COMPARISON:  CT 07/19/2023 FINDINGS: Lower chest: 3-4 mm left lower lobe lung nodule stable going back to at least November 2019. This demonstrates long-term stability. No additional imaging follow-up. Bandlike opacities dependently along both lung bases. Atelectasis is favored. No  pleural effusion. Hepatobiliary: Fatty liver infiltration. Patent portal vein. Stones in the nondilated gallbladder. Tiny peripheral cystic focus seen in segment 4 of the liver, too small to completely characterize but likely benign cysts and no specific imaging follow-up. Pancreas: Unremarkable. No pancreatic ductal dilatation or surrounding inflammatory changes. Spleen: Normal in size without focal abnormality. Adrenals/Urinary Tract: The adrenal glands are preserved. No enhancing renal mass or collecting system dilatation. Stable Bosniak 1 upper pole right-sided renal cystic focus measuring 2.6 cm. Hounsfield unit of 2. No imaging follow-up. The ureters have normal course and caliber extending down to the bladder. Preserved contours of the urinary bladder. Stomach/Bowel: No oral contrast. Few left-sided colonic diverticula. Previously there were some wall thickening and stranding in the area of the ascending colon and cecum. This has markedly increased and there is no extraluminal air and ill-defined fluid surrounding the bowel in this location extending into the mesentery consistent with a perforation. The small bowel and stomach are nondilated. Vascular/Lymphatic: Normal caliber aorta and IVC. Minimal atherosclerotic calcifications. No developing abnormal  lymph node enlargement identified. Reproductive: Prostate is unremarkable. Other: Small amount of ascites identified in the abdomen including along the margin of the liver. Musculoskeletal: Mild degenerative changes of the spine and pelvis. IMPRESSION: Progressive wall thickening along the proximal right side of the colon with now significant surrounding extraluminal fluid, air and stranding worrisome for focal perforation. No associated obstruction. No widespread free air. Gallstones. Critical Value/emergent results were called by telephone at the time of interpretation on 07/25/2023 at 7:48 am to provider [facility] , who verbally acknowledged these results.  Electronically Signed   By: Karen Kays M.D.   On: 07/25/2023 10:50   CARDIAC CATHETERIZATION  Result Date: 07/24/2023 No angiographic evidence of CAD LVEDP=17 mmHg Recommendations: No further ischemic workup.   ECHOCARDIOGRAM COMPLETE  Result Date: 07/21/2023    ECHOCARDIOGRAM REPORT   Patient Name:   ASHLYN PAPP Date of Exam: 07/21/2023 Medical Rec #:  [id number]             Height:       60.6 in Accession #:    [id number]            Weight:       165.0 lb Date of Birth:  01/29/1971             BSA:          1.733 m Patient Age:    52 years              BP:           100/68 mmHg Patient Gender: M                     HR:           83 bpm. Exam Location:  Inpatient Procedure: 2D Echo, Color Doppler, Cardiac Doppler and Intracardiac            Opacification Agent Indications:    Chest Pain  History:        Patient has prior history of Echocardiogram examinations, most                 recent 08/11/2021. Risk Factors:Hypertension and Dyslipidemia.  Sonographer:    Raeford Razor Referring Phys: [id number] Alessandra Bevels IMPRESSIONS  1. Left ventricular ejection fraction, by estimation, is 55 to 60%. The left ventricle has normal function. The left ventricle has no regional wall motion abnormalities. Left ventricular diastolic parameters are consistent with Grade I diastolic dysfunction (impaired relaxation).  2. Right ventricular systolic function is normal. The right ventricular size is normal. There is normal pulmonary artery systolic pressure. The estimated right ventricular systolic pressure is 26.0 mmHg.  3. Left atrial size was mildly dilated.  4. The mitral valve is normal in structure. No evidence of mitral valve regurgitation. No evidence of mitral stenosis.  5. The aortic valve is tricuspid. Aortic valve regurgitation is not visualized. No aortic stenosis is present.  6. The inferior vena cava is normal in size with greater than 50% respiratory variability, suggesting right atrial pressure of 3 mmHg.  FINDINGS  Left Ventricle: Left ventricular ejection fraction, by estimation, is 55 to 60%. The left ventricle has normal function. The left ventricle has no regional wall motion abnormalities. Definity contrast agent was given IV to delineate the left ventricular  endocardial borders. The left ventricular internal cavity size was normal in size. There is no left ventricular hypertrophy. Left ventricular diastolic parameters are consistent with Grade I diastolic dysfunction (impaired relaxation).  Right Ventricle: The right ventricular size is normal. No increase in right ventricular wall thickness. Right ventricular systolic function is normal. There is normal pulmonary artery systolic pressure. The tricuspid regurgitant velocity is 2.40 m/s, and  with an assumed right atrial pressure of 3 mmHg, the estimated right ventricular systolic pressure is 26.0 mmHg. Left Atrium: Left atrial size was mildly dilated. Right Atrium: Right atrial size was normal in size. Pericardium: There is no evidence of pericardial effusion. Mitral Valve: The mitral valve is normal in structure. No evidence of mitral valve regurgitation. No evidence of mitral valve stenosis. Tricuspid Valve: The tricuspid valve is normal in structure. Tricuspid valve regurgitation is trivial. Aortic Valve: The aortic valve is tricuspid. Aortic valve regurgitation is not visualized. No aortic stenosis is present. Aortic valve peak gradient measures 4.8 mmHg. Pulmonic Valve: The pulmonic valve was normal in structure. Pulmonic valve regurgitation is not visualized. Aorta: The aortic root is normal in size and structure. Venous: The inferior vena cava is normal in size with greater than 50% respiratory variability, suggesting right atrial pressure of 3 mmHg. IAS/Shunts: No atrial level shunt detected by color flow Doppler.  LEFT VENTRICLE PLAX 2D LVIDd:         4.70 cm   Diastology LVIDs:         3.00 cm   LV e' medial:    7.40 cm/s LV PW:         0.70 cm   LV  E/e' medial:  10.4 LV IVS:        0.70 cm   LV e' lateral:   10.80 cm/s LVOT diam:     2.30 cm   LV E/e' lateral: 7.1 LV SV:         79 LV SV Index:   45 LVOT Area:     4.15 cm  RIGHT VENTRICLE          IVC RV Basal diam:  3.00 cm  IVC diam: 1.20 cm LEFT ATRIUM             Index        RIGHT ATRIUM           Index LA diam:        3.60 cm 2.08 cm/m   RA Area:     14.40 cm LA Vol (A2C):   45.2 ml 26.08 ml/m  RA Volume:   32.60 ml  18.81 ml/m LA Vol (A4C):   56.0 ml 32.31 ml/m LA Biplane Vol: 51.9 ml 29.95 ml/m  AORTIC VALVE AV Area (Vmax): 3.66 cm AV Vmax:        109.00 cm/s AV Peak Grad:   4.8 mmHg LVOT Vmax:      96.00 cm/s LVOT Vmean:     60.500 cm/s LVOT VTI:       0.189 m  AORTA Ao Root diam: 2.50 cm Ao Asc diam:  3.00 cm MITRAL VALVE               TRICUSPID VALVE MV Area (PHT): 4.60 cm    TR Peak grad:   23.0 mmHg MV Decel Time: 165 msec    TR Vmax:        240.00 cm/s MV E velocity: 76.70 cm/s MV A velocity: 86.50 cm/s  SHUNTS MV E/A ratio:  0.89        Systemic VTI:  0.19 m  Systemic Diam: 2.30 cm Dalton McleanMD Electronically signed by Wilfred Lacy Signature Date/Time: 07/21/2023/3:31:40 PM    Final    CT Head Wo Contrast  Result Date: 07/20/2023 CLINICAL DATA:  Headache EXAM: CT HEAD WITHOUT CONTRAST TECHNIQUE: Contiguous axial images were obtained from the base of the skull through the vertex without intravenous contrast. RADIATION DOSE REDUCTION: This exam was performed according to the departmental dose-optimization program which includes automated exposure control, adjustment of the mA and/or kV according to patient size and/or use of iterative reconstruction technique. COMPARISON:  08/11/21 FINDINGS: Brain: There is no mass, hemorrhage or extra-axial collection. The size and configuration of the ventricles and extra-axial CSF spaces are normal. The brain parenchyma is normal, without acute or chronic infarction. Vascular: No abnormal hyperdensity of the major  intracranial arteries or dural venous sinuses. No intracranial atherosclerosis. Skull: Unchanged soft tissue lesion at the left anterior scalp. Sinuses/Orbits: No fluid levels or advanced mucosal thickening of the visualized paranasal sinuses. No mastoid or middle ear effusion. The orbits are normal. IMPRESSION: 1. No acute intracranial abnormality. Electronically Signed   By: Deatra Robinson M.D.   On: 07/20/2023 02:14   CT ABDOMEN PELVIS W CONTRAST  Result Date: 07/20/2023 CLINICAL DATA:  PE suspected, high probability, abdominal pain, postop EXAM: CT ANGIOGRAPHY CHEST CT ABDOMEN AND PELVIS WITH CONTRAST TECHNIQUE: Multidetector CT imaging of the chest was performed using the standard protocol during bolus administration of intravenous contrast. Multiplanar CT image reconstructions and MIPs were obtained to evaluate the vascular anatomy. Multidetector CT imaging of the abdomen and pelvis was performed using the standard protocol during bolus administration of intravenous contrast. RADIATION DOSE REDUCTION: This exam was performed according to the departmental dose-optimization program which includes automated exposure control, adjustment of the mA and/or kV according to patient size and/or use of iterative reconstruction technique. CONTRAST:  OMNIPAQUE IOHEXOL 350 MG/ML SOLN COMPARISON:  CTA chest 08/11/2021 and CT abdomen and pelvis 09/21/2020 FINDINGS: CTA CHEST FINDINGS Cardiovascular: Negative for acute pulmonary embolism. Normal caliber thoracic aorta. No pericardial effusion. Mediastinum/Nodes: Trachea and esophagus are unremarkable. No thoracic adenopathy Lungs/Pleura: Bilateral lower lobe atelectasis. Calcified granuloma left upper lobe. No pleural effusion or pneumothorax. Musculoskeletal: No acute fracture. Review of the MIP images confirms the above findings. CT ABDOMEN and PELVIS FINDINGS Hepatobiliary: Cholelithiasis. No evidence of cholecystitis. No biliary dilation. Unremarkable liver.  Pancreas: Unremarkable. Spleen: Unremarkable. Adrenals/Urinary Tract: Normal adrenal glands. No urinary calculi or hydronephrosis. Unremarkable bladder. Stomach/Bowel: Inflammatory fat stranding centered about the terminal ileum with mild colonic and terminal ileal wall thickening. Reactive hyperemia of the normal caliber appendix. Scattered colonic diverticula. No evidence of obstruction. Stomach is within normal limits. Vascular/Lymphatic: Prominent subcentimeter ileocolic lymph nodes on the right are likely reactive. No acute vascular abnormality. Reproductive: No acute abnormality. Other: Small amount of free fluid in the right pericolic gutter. No free intraperitoneal air. Musculoskeletal: No acute fracture. Review of the MIP images confirms the above findings. IMPRESSION: 1. Negative for acute pulmonary embolism. 2. Constellation of findings favored to represent infectious or inflammatory terminal ileitis/ascending colitis. No abscess or perforation. Electronically Signed   By: Minerva Fester M.D.   On: 07/20/2023 00:24   CT Angio Chest PE W and/or Wo Contrast  Result Date: 07/20/2023 CLINICAL DATA:  PE suspected, high probability, abdominal pain, postop EXAM: CT ANGIOGRAPHY CHEST CT ABDOMEN AND PELVIS WITH CONTRAST TECHNIQUE: Multidetector CT imaging of the chest was performed using the standard protocol during bolus administration of intravenous contrast. Multiplanar CT image reconstructions and  MIPs were obtained to evaluate the vascular anatomy. Multidetector CT imaging of the abdomen and pelvis was performed using the standard protocol during bolus administration of intravenous contrast. RADIATION DOSE REDUCTION: This exam was performed according to the departmental dose-optimization program which includes automated exposure control, adjustment of the mA and/or kV according to patient size and/or use of iterative reconstruction technique. CONTRAST:  OMNIPAQUE IOHEXOL 350 MG/ML SOLN COMPARISON:   CTA chest 08/11/2021 and CT abdomen and pelvis 09/21/2020 FINDINGS: CTA CHEST FINDINGS Cardiovascular: Negative for acute pulmonary embolism. Normal caliber thoracic aorta. No pericardial effusion. Mediastinum/Nodes: Trachea and esophagus are unremarkable. No thoracic adenopathy Lungs/Pleura: Bilateral lower lobe atelectasis. Calcified granuloma left upper lobe. No pleural effusion or pneumothorax. Musculoskeletal: No acute fracture. Review of the MIP images confirms the above findings. CT ABDOMEN and PELVIS FINDINGS Hepatobiliary: Cholelithiasis. No evidence of cholecystitis. No biliary dilation. Unremarkable liver. Pancreas: Unremarkable. Spleen: Unremarkable. Adrenals/Urinary Tract: Normal adrenal glands. No urinary calculi or hydronephrosis. Unremarkable bladder. Stomach/Bowel: Inflammatory fat stranding centered about the terminal ileum with mild colonic and terminal ileal wall thickening. Reactive hyperemia of the normal caliber appendix. Scattered colonic diverticula. No evidence of obstruction. Stomach is within normal limits. Vascular/Lymphatic: Prominent subcentimeter ileocolic lymph nodes on the right are likely reactive. No acute vascular abnormality. Reproductive: No acute abnormality. Other: Small amount of free fluid in the right pericolic gutter. No free intraperitoneal air. Musculoskeletal: No acute fracture. Review of the MIP images confirms the above findings. IMPRESSION: 1. Negative for acute pulmonary embolism. 2. Constellation of findings favored to represent infectious or inflammatory terminal ileitis/ascending colitis. No abscess or perforation. Electronically Signed   By: Minerva Fester M.D.   On: 07/20/2023 00:24   DG Chest Port 1 View  Result Date: 07/19/2023 CLINICAL DATA:  Chest pain EXAM: PORTABLE CHEST 1 VIEW COMPARISON:  CTA chest 08/11/2021 FINDINGS: Low lung volumes accentuate cardiomediastinal silhouette and pulmonary vascularity. Bibasilar atelectasis. Nodule in the left mid  lung was previously seen on CT 08/11/2021 and is compatible with a benign calcified granuloma. No pleural effusion or pneumothorax. No displaced rib fractures. IMPRESSION: Low lung volumes and basilar atelectasis. Electronically Signed   By: Minerva Fester M.D.   On: 07/19/2023 23:05    Labs:  CBC: Recent Labs    07/31/23 0218 08/01/23 0554 08/02/23 0542 08/04/23 0110  WBC 13.2* 10.9* 15.4* 10.4  HGB 11.0* 11.5* 14.0 11.6*  HCT 32.7* 33.8* 42.1 34.6*  PLT 620* 658* 817* 715*    COAGS: No results for input(s): "INR", "APTT" in the last 8760 hours.  BMP: Recent Labs    08/01/23 0554 08/02/23 0542 08/03/23 1040 08/04/23 0110  NA 132* 133* 133* 130*  K 4.0 5.1 4.2 4.2  CL 97* 100 101 102  CO2 23 20* 19* 19*  GLUCOSE 117* 131* 137* 97  BUN 7 13 19 17   CALCIUM 8.1* 8.6* 8.4* 8.1*  CREATININE 0.58* 0.78 0.74 0.75  GFRNONAA >60 >60 >60 >60    LIVER FUNCTION TESTS: Recent Labs    07/25/23 0512 07/26/23 0252 07/30/23 0126 07/31/23 0218  BILITOT 0.5 0.5 0.7 0.6  AST 27 21 21 26   ALT 38 31 28 32  ALKPHOS 80 47 76 79  PROT 7.5 6.0* 6.2* 6.1*  ALBUMIN 2.6* 2.5* 2.1* 1.9*    TUMOR MARKERS: No results for input(s): "AFPTM", "CEA", "CA199", "CHROMGRNA" in the last 8760 hours.  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  Patient  with ongoing nausea.  Abdominal incision with some dehiscence and purulence.  WBC elevated to 15.4 over the weekend.  Down to 10.4 today. Patient with small, deep intra-abdominal fluid collection with slight interval enlargement.  Previously thought to be small and too close to bowel for safe aspiration.  Now with slight increased, will set up for possible aspiration/drainage if amenable.  Discussed with at bedside with assistance from [place] interpreter [place].    Risks and benefits discussed with the patient including bleeding, infection,  damage to adjacent structures, bowel perforation/fistula connection, and sepsis.  All of the patient's questions were answered, patient is agreeable to proceed. Consent signed and in chart.    Thank you for this interesting consult.  I greatly enjoyed meeting [facility] and look forward to participating in their care.  A copy of this report was sent to the requesting provider on this date.  Electronically Signed: Hoyt Koch, PA 08/04/2023, 1:12 PM   I spent a total of 40 Minutes    in face to face in clinical consultation, greater than 50% of which was counseling/coordinating care for intra-abdominal fluid collection.

## 2023-08-04 NOTE — Progress Notes (Signed)
PT Cancellation Note  Patient Details Name: Carl Briggs MRN: [id number] DOB: [birth year]   Cancelled Treatment:    Reason Eval/Treat Not Completed: Patient at procedure or test/unavailable  Lillia Pauls, PT, DPT Acute Rehabilitation Services Office [phone]    Norval Morton 08/04/2023, 3:29 PM

## 2023-08-04 NOTE — Progress Notes (Signed)
Check on pt needs, he's NPO now by Orlan Leavens Spanish Medical Interpreter.

## 2023-08-04 NOTE — Progress Notes (Addendum)
Abdominal Binder applied on pt as ordered. Mid abdominal wound dressing change performed.

## 2023-08-04 NOTE — Sedation Documentation (Signed)
Dr. Bryn Gulling in to see first scan of patient's abdomen for CT abscess drain and stated there is nothing to drain or aspirate.  Procedure cancelled.  Report called to Tresa Endo, RN on [phone].

## 2023-08-04 NOTE — Progress Notes (Signed)
[facility] Progress Note  10 Days Post-Op  Subjective: Uneventful night.  He is tolerating p.o., although the smell from his wound does give him nausea.  He is having stool and flatus per ostomy.  CT overnight reviewed.  His white count has again normalized.  Objective: Vital signs in last 24 hours: Temp:  [97.6 F (36.4 C)-98.2 F (36.8 C)] 97.6 F (36.4 C) (08/12 0750) Pulse Rate:  [78-105] 78 (08/12 0750) Resp:  [15-18] 15 (08/12 0750) BP: (99-113)/(71-77) 101/71 (08/12 0750) SpO2:  [92 %-96 %] 96 % (08/12 0750) Weight:  [67.4 kg] 67.4 kg (08/12 0323) Last BM Date : 08/02/23  Intake/Output from previous day: 08/11 0701 - 08/12 0700 In: 240 [P.O.:240] Out: 1155 [Urine:200; Drains:5; Stool:950] Intake/Output this shift: No intake/output data recorded.  PE:  Gen:  Alert, NAD, pleasant Card:  RRR Pulm:  rate and effort normal  Abd: Soft, ND, appropriately tender, open midline with necrotic base moderate drainage and small dehiscence at the superior aspect of the fascia. JP purulent, ostomy viable thick stool in the bag  Lab Results:  Recent Labs    08/02/23 0542 08/04/23 0110  WBC 15.4* 10.4  HGB 14.0 11.6*  HCT 42.1 34.6*  PLT 817* 715*   BMET Recent Labs    08/03/23 1040 08/04/23 0110  NA 133* 130*  K 4.2 4.2  CL 101 102  CO2 19* 19*  GLUCOSE 137* 97  BUN 19 17  CREATININE 0.74 0.75  CALCIUM 8.4* 8.1*   PT/INR No results for input(s): "LABPROT", "INR" in the last 72 hours. CMP     Component Value Date/Time   NA 130 (L) 08/04/2023 0110   NA 141 12/14/2018 1545   K 4.2 08/04/2023 0110   CL 102 08/04/2023 0110   CO2 19 (L) 08/04/2023 0110   GLUCOSE 97 08/04/2023 0110   BUN 17 08/04/2023 0110   BUN 14 12/14/2018 1545   CREATININE 0.75 08/04/2023 0110   CALCIUM 8.1 (L) 08/04/2023 0110   PROT 6.1 (L) 07/31/2023 0218   PROT 7.3 12/14/2018 1545   ALBUMIN 1.9 (L) 07/31/2023 0218   ALBUMIN 4.1 12/14/2018 1545   AST 26 07/31/2023 0218    ALT 32 07/31/2023 0218   ALKPHOS 79 07/31/2023 0218   BILITOT 0.6 07/31/2023 0218   BILITOT 0.4 12/14/2018 1545   GFRNONAA >60 08/04/2023 0110   GFRAA 118 12/14/2018 1545   Lipase     Component Value Date/Time   LIPASE 34 07/25/2023 0512       Studies/Results: CT ABDOMEN PELVIS W CONTRAST  Result Date: 08/03/2023 CLINICAL DATA:  Follow-up intra-abdominal abscess EXAM: CT ABDOMEN AND PELVIS WITH CONTRAST TECHNIQUE: Multidetector CT imaging of the abdomen and pelvis was performed using the standard protocol following bolus administration of intravenous contrast. RADIATION DOSE REDUCTION: This exam was performed according to the departmental dose-optimization program which includes automated exposure control, adjustment of the mA and/or kV according to patient size and/or use of iterative reconstruction technique. CONTRAST:  75mL OMNIPAQUE IOHEXOL 350 MG/ML SOLN COMPARISON:  07/30/2023 FINDINGS: Lower chest: Mild bibasilar atelectatic changes are noted. No sizable effusion is seen. Previously seen calcified granuloma is again noted in the lingula. Hepatobiliary: Fatty infiltration of the liver is noted. The gallbladder demonstrates a few dependent gallstones. No wall thickening or pericholecystic fluid is noted. Pancreas: Unremarkable. No pancreatic ductal dilatation or surrounding inflammatory changes. Spleen: Normal in size without focal abnormality. Adrenals/Urinary Tract: Adrenal glands are within normal limits. Kidneys are well visualized bilaterally  within normal enhancement pattern. Stable simple appearing cyst in the right kidney is noted. No follow-up is recommended. The bladder is partially distended. Stomach/Bowel: Diverticular change of the colon is noted. The colon is predominately decompressed. Changes of prior right colectomy are noted. 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 No obstructive changes in the small bowel are seen. Stomach is distended with ingested food stuffs. Previously seen  fluid collection within the pelvis superior to the bladder is again identified and now measures approximately 5.2 x 3.4 cm. This is a slight increase in the AP dimension when compared with the prior exam. Additionally it appears to inter communicate with a smaller collection superiorly. Stable fluid surrounding the hepatic tip is noted. No other fluid collection is identified. Vascular/Lymphatic: Aortic atherosclerosis. No enlarged abdominal or pelvic lymph nodes. Reproductive: Prostate is unremarkable. Other: Postsurgical changes are noted in the anterior abdominal wall. Large open wound remains. Surgical drain remains in place. Musculoskeletal: No acute or significant osseous findings. IMPRESSION: Postsurgical changes consistent with the given clinical history. Persistent and slightly enlarged fluid collection is noted deep in the pelvis just superior to the bladder. This is again consistent with postoperative abscess. It appears to communicate with a few smaller collections. No new postoperative fluid collection is identified. Cholelithiasis without complicating factors. Electronically Signed   By: Alcide Clever M.D.   On: 08/03/2023 20:47    Anti-infectives: Anti-infectives (From admission, onward)    Start     Dose/Rate Route Frequency Ordered Stop   07/31/23 1000  amoxicillin-clavulanate (AUGMENTIN) 875-125 MG per tablet 1 tablet        1 tablet Oral Every 12 hours 07/31/23 0900     07/26/23 0030  piperacillin-tazobactam (ZOSYN) IVPB 3.375 g  Status:  Discontinued        3.375 g 12.5 mL/hr over 240 Minutes Intravenous Every 8 hours 07/26/23 0019 07/31/23 0900   07/25/23 1400  piperacillin-tazobactam (ZOSYN) IVPB 3.375 g  Status:  Discontinued        3.375 g 12.5 mL/hr over 240 Minutes Intravenous Every 8 hours 07/25/23 1207 07/26/23 0019   07/25/23 1100  piperacillin-tazobactam (ZOSYN) IVPB 3.375 g        3.375 g 100 mL/hr over 30 Minutes Intravenous  Once 07/25/23 1051 07/25/23 1204         Assessment/Plan POD#10 s/p RIGHT COLECTOMY, DRAINAGE RETROPERITONEAL ABSCESS, 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 Janee Morn for perforated colitis with RTP abscess - CT 8/7 with a small intra-abdominal collection not amenable to percutaneous drainage.  Repeat CT 8/11 with stable size but more anterior collection-IR evaluating for possible aspiration -Damp to dry dressing changes to midline wound with saline dampened gauze, at least twice daily and whenever saturated.  Abdominal binder to reduce tension on incision. - Continue Augmentin, may need to reescalate to IV antibiotics depending on clinical course - WOC c/s for new ostomy. Will need ostomy clinic referral upon discharge - continue JP     ID - zosyn 8/2 - 8/8, Augmentin 8/8 -  FEN - IVF, regular diet VTE - SCDs, lovenox Foley - out 8/3 and voiding   Uncontrolled HTN - per medicine Recent admit for elevated troponin, felt likely demand ischemia - left heart cath negative for CAD    LOS: 10 days    Lady Deutscher [facility] Surgery 08/04/2023, 12:39 PM Please see Amion for pager number during day hours 7:00am-4:30pm

## 2023-08-05 ENCOUNTER — Ambulatory Visit: Payer: Self-pay | Admitting: Student

## 2023-08-05 MED ORDER — SODIUM BICARBONATE 650 MG PO TABS
650.0000 mg | ORAL_TABLET | Freq: Three times a day (TID) | ORAL | Status: DC
  Administered 2023-08-05 – 2023-08-08 (×10): 650 mg via ORAL
  Filled 2023-08-05 (×10): qty 1

## 2023-08-05 NOTE — Progress Notes (Addendum)
[facility] Progress Note  11 Days Post-Op  Subjective: Spanish interpreter used.  Seen with RN.  Patient reports pain at his incision that is worse with movement. Well controlled with po medications. Tolerating liquids. He plans to try solids again today. He does have some nausea related to the smell from his wound but denies nausea related to po intake. No vomiting. Having ostomy output. His wife had teaching from wocn this am. He plans to try and empty his ostomy with RN today. He has started to learn how to change his midline wound and will continue teaching today. He is mobilizing with therapies. Voiding without issues.   Objective: Vital signs in last 24 hours: Temp:  [97.5 F (36.4 C)-98.1 F (36.7 C)] 97.6 F (36.4 C) (08/13 0745) Pulse Rate:  [74-100] 74 (08/13 0745) Resp:  [16-29] 16 (08/13 0745) BP: (101-111)/(72-79) 103/74 (08/13 0745) SpO2:  [94 %-98 %] 97 % (08/13 0745) Weight:  [66.6 kg] 66.6 kg (08/13 0300) Last BM Date : 08/04/23  Intake/Output from previous day: 08/12 0701 - 08/13 0700 In: -  Out: 1060 [Urine:400; Drains:10; Stool:650] Intake/Output this shift: Total I/O In: -  Out: 100 [Stool:100]  PE: Gen:  Alert, NAD, pleasant Card:  RRR Pulm:  Rate and effort normal  Abd: Soft, ND, appropriately tender. No rigidity or guarding. Open midline with necrotic base moderate drainage and small dehiscence at the superior aspect of the fascia. JP purulent. Ostomy viable with both semi-solid and liquid stool in the bag   Lab Results:  Recent Labs    08/04/23 0110 08/05/23 0438  WBC 10.4 6.6  HGB 11.6* 11.0*  HCT 34.6* 34.1*  PLT 715* 617*   BMET Recent Labs    08/04/23 0110 08/05/23 0438  NA 130* 130*  K 4.2 4.2  CL 102 103  CO2 19* 17*  GLUCOSE 97 85  BUN 17 17  CREATININE 0.75 0.58*  CALCIUM 8.1* 8.3*   PT/INR Recent Labs    08/04/23 1338  LABPROT 14.8  INR 1.1   CMP     Component Value Date/Time   NA 130 (L)  08/05/2023 0438   NA 141 12/14/2018 1545   K 4.2 08/05/2023 0438   CL 103 08/05/2023 0438   CO2 17 (L) 08/05/2023 0438   GLUCOSE 85 08/05/2023 0438   BUN 17 08/05/2023 0438   BUN 14 12/14/2018 1545   CREATININE 0.58 (L) 08/05/2023 0438   CALCIUM 8.3 (L) 08/05/2023 0438   PROT 6.4 (L) 08/05/2023 0438   PROT 7.3 12/14/2018 1545   ALBUMIN 2.3 (L) 08/05/2023 0438   ALBUMIN 4.1 12/14/2018 1545   AST 23 08/05/2023 0438   ALT 38 08/05/2023 0438   ALKPHOS 68 08/05/2023 0438   BILITOT 0.4 08/05/2023 0438   BILITOT 0.4 12/14/2018 1545   GFRNONAA >60 08/05/2023 0438   GFRAA 118 12/14/2018 1545   Lipase     Component Value Date/Time   LIPASE 34 07/25/2023 0512       Studies/Results: CT PELVIS LIMITED WO CONTRAST  Result Date: 08/05/2023 CLINICAL DATA:  52 year old gentleman with history of pelvic abscess presented to interventional radiology for CT-guided drainage. EXAM: CT PELVIS WITHOUT CONTRAST TECHNIQUE: Multidetector CT imaging of the pelvis was performed following the standard protocol without intravenous contrast. RADIATION DOSE REDUCTION: This exam was performed according to the departmental dose-optimization program which includes automated exposure control, adjustment of the mA and/or kV according to patient size and/or use of iterative reconstruction technique. COMPARISON:  CT  abdomen pelvis 08/03/2023 FINDINGS: Urinary Tract: Excreted contrast noted in the bladder. Small focus of air within the bladder lumen likely related to recent catheterization. Bladder otherwise normal. Bowel: No dilated loops of bowel seen within the visualized pelvis. Sigmoid colon diverticulosis partially visualized. Vascular/Lymphatic: No enlarged lymph nodes are identified within the visualized pelvis. Reproductive:  Prostate is unremarkable. Other: Partially open anterior abdominal wall wound again seen. Right lower quadrant ostomy again identified. Right lower quadrant surgical drain is seen. Previously  seen pelvic fluid collection has nearly completely resolved with minimal linear fluid still remaining posterior and superior to the bladder. Mild soft tissue stranding also noted in the presacral region. Musculoskeletal: No suspicious bone lesions identified. IMPRESSION: Near complete resolution of pelvic fluid collection, with minimal linear fluid still remaining posterior and superior to the bladder. No drainable abscess identified. Electronically Signed   By: Acquanetta Belling M.D.   On: 08/05/2023 10:37   CT ABDOMEN PELVIS W CONTRAST  Result Date: 08/03/2023 CLINICAL DATA:  Follow-up intra-abdominal abscess EXAM: CT ABDOMEN AND PELVIS WITH CONTRAST TECHNIQUE: Multidetector CT imaging of the abdomen and pelvis was performed using the standard protocol following bolus administration of intravenous contrast. RADIATION DOSE REDUCTION: This exam was performed according to the departmental dose-optimization program which includes automated exposure control, adjustment of the mA and/or kV according to patient size and/or use of iterative reconstruction technique. CONTRAST:  75mL OMNIPAQUE IOHEXOL 350 MG/ML SOLN COMPARISON:  07/30/2023 FINDINGS: Lower chest: Mild bibasilar atelectatic changes are noted. No sizable effusion is seen. Previously seen calcified granuloma is again noted in the lingula. Hepatobiliary: Fatty infiltration of the liver is noted. The gallbladder demonstrates a few dependent gallstones. No wall thickening or pericholecystic fluid is noted. Pancreas: Unremarkable. No pancreatic ductal dilatation or surrounding inflammatory changes. Spleen: Normal in size without focal abnormality. Adrenals/Urinary Tract: Adrenal glands are within normal limits. Kidneys are well visualized bilaterally within normal enhancement pattern. Stable simple appearing cyst in the right kidney is noted. No follow-up is recommended. The bladder is partially distended. Stomach/Bowel: Diverticular change of the colon is noted.  The colon is predominately decompressed. Changes of prior right colectomy are noted. 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 No obstructive changes in the small bowel are seen. Stomach is distended with ingested food stuffs. Previously seen fluid collection within the pelvis superior to the bladder is again identified and now measures approximately 5.2 x 3.4 cm. This is a slight increase in the AP dimension when compared with the prior exam. Additionally it appears to inter communicate with a smaller collection superiorly. Stable fluid surrounding the hepatic tip is noted. No other fluid collection is identified. Vascular/Lymphatic: Aortic atherosclerosis. No enlarged abdominal or pelvic lymph nodes. Reproductive: Prostate is unremarkable. Other: Postsurgical changes are noted in the anterior abdominal wall. Large open wound remains. Surgical drain remains in place. Musculoskeletal: No acute or significant osseous findings. IMPRESSION: Postsurgical changes consistent with the given clinical history. Persistent and slightly enlarged fluid collection is noted deep in the pelvis just superior to the bladder. This is again consistent with postoperative abscess. It appears to communicate with a few smaller collections. No new postoperative fluid collection is identified. Cholelithiasis without complicating factors. Electronically Signed   By: Alcide Clever M.D.   On: 08/03/2023 20:47    Anti-infectives: Anti-infectives (From admission, onward)    Start     Dose/Rate Route Frequency Ordered Stop   07/31/23 1000  amoxicillin-clavulanate (AUGMENTIN) 875-125 MG per tablet 1 tablet  1 tablet Oral Every 12 hours 07/31/23 0900     07/26/23 0030  piperacillin-tazobactam (ZOSYN) IVPB 3.375 g  Status:  Discontinued        3.375 g 12.5 mL/hr over 240 Minutes Intravenous Every 8 hours 07/26/23 0019 07/31/23 0900   07/25/23 1400  piperacillin-tazobactam (ZOSYN) IVPB 3.375 g  Status:  Discontinued        3.375 g 12.5 mL/hr over  240 Minutes Intravenous Every 8 hours 07/25/23 1207 07/26/23 0019   07/25/23 1100  piperacillin-tazobactam (ZOSYN) IVPB 3.375 g        3.375 g 100 mL/hr over 30 Minutes Intravenous  Once 07/25/23 1051 07/25/23 1204        Assessment/Plan POD#11 s/p RIGHT COLECTOMY, DRAINAGE RETROPERITONEAL ABSCESS, 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 Janee Morn for perforated colitis with RTP abscess - CT 8/7 with a small intra-abdominal collection not amenable to percutaneous drainage.  Repeat CT 8/11 with stable size but more anterior collection. IR took down for aspiration vs drainage and CT pelvis showed near resolution of pelvic fluid collection so procedure was cancelled.  - Increased damp to dry dressing changes to midline wound with saline dampened gauze TID and more frequently whenever saturated.  Abdominal binder to reduce tension on incision. Have asked RN to perform teaching of this to patient.  - Continue Augmentin. Afebrile. WBC wnl.  - WOC c/s for new ostomy. Will need ostomy clinic referral upon discharge. [facility] RN written for. Will confirm with TOC that patient still have [facility] and DME set up.  - Continue JP. Plan to keep this at dc as it still appears purulent. Will ask RN to do drain teaching.  - Mobilize. PT following. No f/u recommended. DME written for.  - Pulm toilet - Discussed with patient he may be ready for discharge in the next 24 - 48 hours.    ID - Zosyn 8/2 - 8/8, Augmentin 8/8 -  FEN - Regular diet VTE - SCDs, lovenox Foley - out 8/3 and voiding   - Per TRH -  Hyponatremia - 130 Uncontrolled HTN - per medicine Recent admit for elevated troponin, felt likely demand ischemia - left heart cath negative for CAD    LOS: 11 days    Elmer Sow [facility] Surgery 08/05/2023, 11:30 AM Please see Amion for pager number during day hours 7:00am-4:30pm

## 2023-08-05 NOTE — Consult Note (Signed)
WOC Nurse ostomy follow up Patient seen using Spanish electronic interpreter services  Stoma type/location: RLQ, ileostomy Stomal assessment/size: 1 1/8" x 1 3/8" oval shaped, os at center, pink, moist Peristomal assessment: new area of very mild denuded skin, skin barrier had been cut quite a bit larger than the size of the stoma   Treatment options for stomal/peristomal skin: skin barrier wipes, 2" ostomy barrier ring Output chunky, green, with food particles  Ostomy pouching: 2pc. 2 3/4" with 2" skin barrier Education provided:  Met with wife. Patient is minimally engaged to participate.  I had patient open and close new pouch.  Wife is impulsive to provide care, but she was able to cut new skin barrier, we then noted that the stoma had changed shape and size, instruction on re measuring stoma. She then was able to cut new skin barrier using new measurements. New pattern left in the room for use with nursing and to go home with patient. Cut off center to avoid tape border contact with wound bed. Wife removed pouch, cleaned peristomal skin, discussed skin irritation and reason for it.  Wife placed new ostomy barrier ring, placed skin barrier with some cueing. She was able to attach pouch to the skin barrier, however this is the most challenging for the wife. Patient was trying to assist with this portion of the care. She is able to remove the remainder of the tape border covering and verified pouch was closed. She has appropriate questions.  Patient is not sure about meds prior to admission when we are discussing extended release medications, I will review to determine recommendations for conversion if needed to liquid while patient has ileostomy.  Demonstrated "burping" flatus from pouch Demonstrated use of wick to clean spout second time Discussed bathing, diet, gas, medication use, constipation, diarrhea, dehydration  Discussed food blockage   Discussed risk of peristomal hernia All educational  materials are provided in Spanish, reminded them that foods to consider with ileostomy and risk for dehydration handouts are included in these materials.   Wife and patient continue to be concerned about DC with wound /status of wound/status of drains.   Plans for [facility] at DC which I still feel would be helpful to support care of JP/wound and ostomy.   4 pouches/skin barriers/ barrier rings/ pattern in the patient's room Discussed another visit later in the week if the patient remains inpatient. She has asked that we call her daughter Lupe to arrange a time and that early am is better for her so that she can get to work.    Enrolled patient in [place] Secure Start Discharge program: Yes  WOC Nurse will follow along with you for continued support with ostomy teaching and care Ruthie Berch [facility], CNS, CWON-AP [phone]

## 2023-08-05 NOTE — Progress Notes (Signed)
PROGRESS NOTE    Carl Briggs  POE:[id number] DOB: Oct 09, 1971 DOA: 07/25/2023 PCP: Patient, No Pcp Per   Brief Narrative:  52 y.o. male, with history of hypertension, hyperlipidemia, anxiety disorder who was just discharged from the hospital on 07/24/2023 after he was treated for sepsis due to colitis presented with worsening abdominal pain.  On presentation, CT of the abdomen/pelvis showed contained perforation in the ascending colon.  He was started on IV antibiotic.  General surgery was consulted.  He underwent surgical intervention with right colectomy, drainage of retroperitoneal abscess and ileostomy on 07/25/23 by general surgery.  Care transferred to the general surgery service on 07/30/2023.  Assessment & Plan:   Perforated colitis with retroperitoneal abscess - underwent surgical intervention with right colectomy, drainage of retroperitoneal abscess and ileostomy on 07/25/23 by general surgery.  CT of abdomen and pelvis repeated on 08/03/2023 showed possible postoperative pelvic abscess: IR has been consulted -Zosyn has been switched to oral Augmentin by general surgery. -Ostomy/wound care/pain management and diet as per general surgery recommendations. -Care transferred to the general surgery service on 07/30/2023.  Hyponatremia -Possibly from dehydration and poor oral intake.  Sodium 130 today as well.  Monitor intermittently.  Off IV fluids.  Encourage oral intake.  If oral intake does not improve, might have to resume normal saline.  Acute metabolic acidosis -Bicarb 17 today.  Start oral sodium bicarbonate supplementation.  Monitor.  Leukocytosis -Resolved  Thrombocytosis -Possibly reactive  Hypertension -Blood pressure on the lower side.  Lisinopril on hold  Anxiety -Lexapro on hold  Elevated troponin during previous admission -Recently was seen by cardiology, underwent cardiac catheterization on 07/23/2023 with normal coronaries   Subjective: Patient seen and  examined at bedside.  Still having abdominal pain intermittently with some nausea.  No fever, seizures or agitation reported.   Objective: Vitals:   08/04/23 1910 08/04/23 2305 08/05/23 0300 08/05/23 0745  BP: 110/75 108/74 101/73 103/74  Pulse: 100 92 77 74  Resp: 18 20 16 16   Temp: 98.1 F (36.7 C) 98.1 F (36.7 C) 98 F (36.7 C) 97.6 F (36.4 C)  TempSrc: Oral Oral Oral Oral  SpO2: 94% 95% 96% 97%  Weight:   66.6 kg     Intake/Output Summary (Last 24 hours) at 08/05/2023 1116 Last data filed at 08/05/2023 0900 Gross per 24 hour  Intake --  Output 1160 ml  Net -1160 ml   Filed Weights   08/03/23 0500 08/04/23 0323 08/05/23 0300  Weight: 70.1 kg 67.4 kg 66.6 kg    Examination:  General: No acute distress.  Still on room air.  Looks chronically ill and deconditioned. respiratory: Bilateral decreased breath sounds at bases with scattered crackles  CVS: S1-S2 heard; rate controlled currently  abdominal: Soft, still tender and distended; no organomegaly; normal bowel sounds heard.  Dressing/JP drain/ostomy present extremities: No cyanosis; trace lower extremity edema present  Data Reviewed: I have personally reviewed following labs and imaging studies  CBC: Recent Labs  Lab 07/31/23 0218 08/01/23 0554 08/02/23 0542 08/04/23 0110 08/05/23 0438  WBC 13.2* 10.9* 15.4* 10.4 6.6  NEUTROABS 10.9* 8.7* 12.8*  --   --   HGB 11.0* 11.5* 14.0 11.6* 11.0*  HCT 32.7* 33.8* 42.1 34.6* 34.1*  MCV 87.0 88.7 87.9 89.4 89.0  PLT 620* 658* 817* 715* 617*   Basic Metabolic Panel: Recent Labs  Lab 07/31/23 0218 08/01/23 0554 08/02/23 0542 08/03/23 1040 08/04/23 0110 08/05/23 0438  NA 132* 132* 133* 133* 130* 130*  K 4.0  4.0 5.1 4.2 4.2 4.2  CL 100 97* 100 101 102 103  CO2 24 23 20* 19* 19* 17*  GLUCOSE 114* 117* 131* 137* 97 85  BUN 9 7 13 19 17 17   CREATININE 0.67 0.58* 0.78 0.74 0.75 0.58*  CALCIUM 7.8* 8.1* 8.6* 8.4* 8.1* 8.3*  MG 2.3 2.1 2.4  --  2.0 2.0    GFR: Estimated Creatinine Clearance: 86.8 mL/min (A) (by C-G formula based on SCr of 0.58 mg/dL (L)). Liver Function Tests: Recent Labs  Lab 07/30/23 0126 07/31/23 0218 08/05/23 0438  AST 21 26 23   ALT 28 32 38  ALKPHOS 76 79 68  BILITOT 0.7 0.6 0.4  PROT 6.2* 6.1* 6.4*  ALBUMIN 2.1* 1.9* 2.3*   No results for input(s): "LIPASE", "AMYLASE" in the last 168 hours.  No results for input(s): "AMMONIA" in the last 168 hours. Coagulation Profile: Recent Labs  Lab 08/04/23 1338  INR 1.1   Cardiac Enzymes: No results for input(s): "CKTOTAL", "CKMB", "CKMBINDEX", "TROPONINI" in the last 168 hours. BNP (last 3 results) No results for input(s): "PROBNP" in the last 8760 hours. HbA1C: No results for input(s): "HGBA1C" in the last 72 hours. CBG: No results for input(s): "GLUCAP" in the last 168 hours. Lipid Profile: No results for input(s): "CHOL", "HDL", "LDLCALC", "TRIG", "CHOLHDL", "LDLDIRECT" in the last 72 hours. Thyroid Function Tests: No results for input(s): "TSH", "T4TOTAL", "FREET4", "T3FREE", "THYROIDAB" in the last 72 hours. Anemia Panel: No results for input(s): "VITAMINB12", "FOLATE", "FERRITIN", "TIBC", "IRON", "RETICCTPCT" in the last 72 hours. Sepsis Labs: No results for input(s): "PROCALCITON", "LATICACIDVEN" in the last 168 hours.   Recent Results (from the past 240 hour(s))  MRSA Next Gen by PCR, Nasal     Status: None   Collection Time: 08/01/23 12:45 PM   Specimen: Nasal Mucosa; Nasal Swab  Result Value Ref Range Status   MRSA by PCR Next Gen NOT DETECTED NOT DETECTED Final    Comment: (NOTE) The GeneXpert MRSA Assay (FDA approved for NASAL specimens only), is one component of a comprehensive MRSA colonization surveillance program. It is not intended to diagnose MRSA infection nor to guide or monitor treatment for MRSA infections. Test performance is not FDA approved in patients less than 67 years old. Performed at [facility], [address] [place], [place] [address]          Radiology Studies: CT PELVIS LIMITED WO CONTRAST  Result Date: 08/05/2023 CLINICAL DATA:  52 year old gentleman with history of pelvic abscess presented to interventional radiology for CT-guided drainage. EXAM: CT PELVIS WITHOUT CONTRAST TECHNIQUE: Multidetector CT imaging of the pelvis was performed following the standard protocol without intravenous contrast. RADIATION DOSE REDUCTION: This exam was performed according to the departmental dose-optimization program which includes automated exposure control, adjustment of the mA and/or kV according to patient size and/or use of iterative reconstruction technique. COMPARISON:  CT abdomen pelvis 08/03/2023 FINDINGS: Urinary Tract: Excreted contrast noted in the bladder. Small focus of air within the bladder lumen likely related to recent catheterization. Bladder otherwise normal. Bowel: No dilated loops of bowel seen within the visualized pelvis. Sigmoid colon diverticulosis partially visualized. Vascular/Lymphatic: No enlarged lymph nodes are identified within the visualized pelvis. Reproductive:  Prostate is unremarkable. Other: Partially open anterior abdominal wall wound again seen. Right lower quadrant ostomy again identified. Right lower quadrant surgical drain is seen. Previously seen pelvic fluid collection has nearly completely resolved with minimal linear fluid still remaining posterior and superior to the bladder. Mild  soft tissue stranding also noted in the presacral region. Musculoskeletal: No suspicious bone lesions identified. IMPRESSION: Near complete resolution of pelvic fluid collection, with minimal linear fluid still remaining posterior and superior to the bladder. No drainable abscess identified. Electronically Signed   By: Acquanetta Belling M.D.   On: 08/05/2023 10:37   CT ABDOMEN PELVIS W CONTRAST  Result Date: 08/03/2023 CLINICAL DATA:  Follow-up intra-abdominal abscess EXAM: CT ABDOMEN AND  PELVIS WITH CONTRAST TECHNIQUE: Multidetector CT imaging of the abdomen and pelvis was performed using the standard protocol following bolus administration of intravenous contrast. RADIATION DOSE REDUCTION: This exam was performed according to the departmental dose-optimization program which includes automated exposure control, adjustment of the mA and/or kV according to patient size and/or use of iterative reconstruction technique. CONTRAST:  75mL OMNIPAQUE IOHEXOL 350 MG/ML SOLN COMPARISON:  07/30/2023 FINDINGS: Lower chest: Mild bibasilar atelectatic changes are noted. No sizable effusion is seen. Previously seen calcified granuloma is again noted in the lingula. Hepatobiliary: Fatty infiltration of the liver is noted. The gallbladder demonstrates a few dependent gallstones. No wall thickening or pericholecystic fluid is noted. Pancreas: Unremarkable. No pancreatic ductal dilatation or surrounding inflammatory changes. Spleen: Normal in size without focal abnormality. Adrenals/Urinary Tract: Adrenal glands are within normal limits. Kidneys are well visualized bilaterally within normal enhancement pattern. Stable simple appearing cyst in the right kidney is noted. No follow-up is recommended. The bladder is partially distended. Stomach/Bowel: Diverticular change of the colon is noted. The colon is predominately decompressed. Changes of prior right colectomy are noted. 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 No obstructive changes in the small bowel are seen. Stomach is distended with ingested food stuffs. Previously seen fluid collection within the pelvis superior to the bladder is again identified and now measures approximately 5.2 x 3.4 cm. This is a slight increase in the AP dimension when compared with the prior exam. Additionally it appears to inter communicate with a smaller collection superiorly. Stable fluid surrounding the hepatic tip is noted. No other fluid collection is identified. Vascular/Lymphatic: Aortic  atherosclerosis. No enlarged abdominal or pelvic lymph nodes. Reproductive: Prostate is unremarkable. Other: Postsurgical changes are noted in the anterior abdominal wall. Large open wound remains. Surgical drain remains in place. Musculoskeletal: No acute or significant osseous findings. IMPRESSION: Postsurgical changes consistent with the given clinical history. Persistent and slightly enlarged fluid collection is noted deep in the pelvis just superior to the bladder. This is again consistent with postoperative abscess. It appears to communicate with a few smaller collections. No new postoperative fluid collection is identified. Cholelithiasis without complicating factors. Electronically Signed   By: Alcide Clever M.D.   On: 08/03/2023 20:47        Scheduled Meds:  acetaminophen  1,000 mg Oral Q6H   amoxicillin-clavulanate  1 tablet Oral Q12H   enoxaparin (LOVENOX) injection  40 mg Subcutaneous Q24H   feeding supplement  237 mL Oral BID BM   ibuprofen  600 mg Oral QID   methocarbamol  1,000 mg Oral Q6H   tamsulosin  0.4 mg Oral Daily   Continuous Infusions:          Glade Lloyd, MD [facility] 08/05/2023, 11:16 AM '

## 2023-08-05 NOTE — Progress Notes (Signed)
Check with RN about pt diet, ordered meals by Orlan Leavens Spanish Medical Interpreter.

## 2023-08-05 NOTE — Progress Notes (Signed)
Physical Therapy Treatment Patient Details Name: Carl Briggs MRN: [id number] DOB: 1971-08-02 Today's Date: 08/05/2023   History of Present Illness The pt is a 52 yo male presenting 8/2 with abdominal pain, emesis, and diarrhea. Pt found to have perforated ascending colon and is s/p R colectomy with ileostomy on 8/2. Pt with recent admission for sepsis due to colitis and was d/c home on 8/1. PMH includes: HTN, HLD, and anxiety.    PT Comments  Pt progressing steadily towards his physical therapy goals, demonstrating improved activity tolerance and ambulation distance. Pt ambulating 450 ft with no assistive device at a supervision level. Reports 8/10 abdominal pain post walk and RN notified for pain medication; continue to recommend RW for pain control as needed and for unlevel surfaces. Education provided regarding daily ambulation, 3x/day. No PT follow up recommended.    If plan is discharge home, recommend the following: A little help with bathing/dressing/bathroom;Assistance with cooking/housework;Assist for transportation;Help with stairs or ramp for entrance   Can travel by private vehicle        Equipment Recommendations  Rolling walker (2 wheels);BSC/3in1    Recommendations for Other Services       Precautions / Restrictions Precautions Precautions: Fall Precaution Comments: colostomy Restrictions Weight Bearing Restrictions: No     Mobility  Bed Mobility Overal bed mobility: Modified Independent             General bed mobility comments: Increased time and effort, good log roll technique, + use of rail    Transfers Overall transfer level: Independent Equipment used: None                    Ambulation/Gait Ambulation/Gait assistance: Supervision Gait Distance (Feet): 450 Feet Assistive device: None Gait Pattern/deviations: Step-through pattern, Decreased stride length, Trunk flexed Gait velocity: decreased     General Gait Details:  Slightly crouched posture, slow and steady pace   Stairs             Wheelchair Mobility     Tilt Bed    Modified Rankin (Stroke Patients Only)       Balance Overall balance assessment: Mild deficits observed, not formally tested                                          Cognition Arousal: Alert Behavior During Therapy: WFL for tasks assessed/performed Overall Cognitive Status: Within Functional Limits for tasks assessed                                          Exercises      General Comments        Pertinent Vitals/Pain Pain Assessment Pain Assessment: 0-10 Pain Score: 8  Pain Location: abdomen Pain Descriptors / Indicators: Discomfort, Grimacing, Guarding Pain Intervention(s): Patient requesting pain meds-RN notified    Home Living                          Prior Function            PT Goals (current goals can now be found in the care plan section) Acute Rehab PT Goals Patient Stated Goal: return home Potential to Achieve Goals: Good Progress towards PT goals: Progressing toward goals    Frequency  Min 1X/week      PT Plan Current plan remains appropriate    Co-evaluation              AM-PAC PT "6 Clicks" Mobility   Outcome Measure  Help needed turning from your back to your side while in a flat bed without using bedrails?: None Help needed moving from lying on your back to sitting on the side of a flat bed without using bedrails?: None Help needed moving to and from a bed to a chair (including a wheelchair)?: None Help needed standing up from a chair using your arms (e.g., wheelchair or bedside chair)?: A Little Help needed to walk in hospital room?: A Little Help needed climbing 3-5 steps with a railing? : A Little 6 Click Score: 21    End of Session   Activity Tolerance: Patient tolerated treatment well Patient left: in bed;with call bell/phone within reach Nurse  Communication: Mobility status PT Visit Diagnosis: Other abnormalities of gait and mobility (R26.89);Pain     Time: 4098-1191 PT Time Calculation (min) (ACUTE ONLY): 19 min  Charges:    $Therapeutic Activity: 8-22 mins PT General Charges $$ ACUTE PT VISIT: 1 Visit                     Lillia Pauls, PT, DPT Acute Rehabilitation Services Office [phone]    Norval Morton 08/05/2023, 4:49 PM

## 2023-08-06 MED ORDER — IBUPROFEN 200 MG PO TABS: 400.0000 mg | ORAL_TABLET | Freq: Four times a day (QID) | ORAL | Status: DC | PRN

## 2023-08-06 NOTE — Progress Notes (Signed)
[facility] Progress Note  12 Days Post-Op  Subjective: Spanish interpreter used.  Seen with RN.  Patient reports pain at his incision that is worse with movement, improved today and well controlled with po medications. Tolerating soft diet and finishing 3/4 of his tray. He initially told me he does have nausea related to po intake but then stated it was from the smell from his wound and not worsened with po intake. No vomiting. Having ostomy output. He has learned how to change his midline wound and feels comfortable with this. He has emptied his ostomy bag. He is mobilizing with therapies. Voiding without issues.   To note, he did have soft BP this am at 99/75. RN retook this with different cuff while I was in the room and this was improved to 121/79. Tachycardia resolved. Afebrile.  Objective: Vital signs in last 24 hours: Temp:  [97.8 F (36.6 C)-98.4 F (36.9 C)] 97.8 F (36.6 C) (08/14 0746) Pulse Rate:  [72-106] 76 (08/14 0746) Resp:  [16-18] 18 (08/14 0746) BP: (99-111)/(68-75) 99/75 (08/14 0746) SpO2:  [94 %-100 %] 96 % (08/14 0746) Weight:  [66.3 kg] 66.3 kg (08/14 0347) Last BM Date : 08/05/23  Intake/Output from previous day: 08/13 0701 - 08/14 0700 In: 360 [P.O.:360] Out: 2102 [Urine:1100; Drains:27; Stool:975] Intake/Output this shift: Total I/O In: -  Out: 5 [Drains:5]  PE: Gen:  Alert, NAD, pleasant Card:  RRR Pulm:  Rate and effort normal  Abd: Soft, ND, appropriately tender. No rigidity or guarding. Open midline with necrotic base moderate drainage and small dehiscence at the superior aspect of the fascia. JP purulent. Ostomy viable with both semi-solid and liquid stool in the bag   Lab Results:  Recent Labs    08/04/23 0110 08/05/23 0438  WBC 10.4 6.6  HGB 11.6* 11.0*  HCT 34.6* 34.1*  PLT 715* 617*   BMET Recent Labs    08/05/23 0438 08/06/23 0630  NA 130* 130*  K 4.2 4.5  CL 103 101  CO2 17* 21*  GLUCOSE 85 91  BUN 17 18   CREATININE 0.58* 0.61  CALCIUM 8.3* 8.5*   PT/INR Recent Labs    08/04/23 1338  LABPROT 14.8  INR 1.1   CMP     Component Value Date/Time   NA 130 (L) 08/06/2023 0630   NA 141 12/14/2018 1545   K 4.5 08/06/2023 0630   CL 101 08/06/2023 0630   CO2 21 (L) 08/06/2023 0630   GLUCOSE 91 08/06/2023 0630   BUN 18 08/06/2023 0630   BUN 14 12/14/2018 1545   CREATININE 0.61 08/06/2023 0630   CALCIUM 8.5 (L) 08/06/2023 0630   PROT 6.4 (L) 08/05/2023 0438   PROT 7.3 12/14/2018 1545   ALBUMIN 2.3 (L) 08/05/2023 0438   ALBUMIN 4.1 12/14/2018 1545   AST 23 08/05/2023 0438   ALT 38 08/05/2023 0438   ALKPHOS 68 08/05/2023 0438   BILITOT 0.4 08/05/2023 0438   BILITOT 0.4 12/14/2018 1545   GFRNONAA >60 08/06/2023 0630   GFRAA 118 12/14/2018 1545   Lipase     Component Value Date/Time   LIPASE 34 07/25/2023 0512       Studies/Results: CT PELVIS LIMITED WO CONTRAST  Result Date: 08/05/2023 CLINICAL DATA:  52 year old gentleman with history of pelvic abscess presented to interventional radiology for CT-guided drainage. EXAM: CT PELVIS WITHOUT CONTRAST TECHNIQUE: Multidetector CT imaging of the pelvis was performed following the standard protocol without intravenous contrast. RADIATION DOSE REDUCTION: This exam was performed  according to the departmental dose-optimization program which includes automated exposure control, adjustment of the mA and/or kV according to patient size and/or use of iterative reconstruction technique. COMPARISON:  CT abdomen pelvis 08/03/2023 FINDINGS: Urinary Tract: Excreted contrast noted in the bladder. Small focus of air within the bladder lumen likely related to recent catheterization. Bladder otherwise normal. Bowel: No dilated loops of bowel seen within the visualized pelvis. Sigmoid colon diverticulosis partially visualized. Vascular/Lymphatic: No enlarged lymph nodes are identified within the visualized pelvis. Reproductive:  Prostate is unremarkable.  Other: Partially open anterior abdominal wall wound again seen. Right lower quadrant ostomy again identified. Right lower quadrant surgical drain is seen. Previously seen pelvic fluid collection has nearly completely resolved with minimal linear fluid still remaining posterior and superior to the bladder. Mild soft tissue stranding also noted in the presacral region. Musculoskeletal: No suspicious bone lesions identified. IMPRESSION: Near complete resolution of pelvic fluid collection, with minimal linear fluid still remaining posterior and superior to the bladder. No drainable abscess identified. Electronically Signed   By: Acquanetta Belling M.D.   On: 08/05/2023 10:37    Anti-infectives: Anti-infectives (From admission, onward)    Start     Dose/Rate Route Frequency Ordered Stop   07/31/23 1000  amoxicillin-clavulanate (AUGMENTIN) 875-125 MG per tablet 1 tablet        1 tablet Oral Every 12 hours 07/31/23 0900     07/26/23 0030  piperacillin-tazobactam (ZOSYN) IVPB 3.375 g  Status:  Discontinued        3.375 g 12.5 mL/hr over 240 Minutes Intravenous Every 8 hours 07/26/23 0019 07/31/23 0900   07/25/23 1400  piperacillin-tazobactam (ZOSYN) IVPB 3.375 g  Status:  Discontinued        3.375 g 12.5 mL/hr over 240 Minutes Intravenous Every 8 hours 07/25/23 1207 07/26/23 0019   07/25/23 1100  piperacillin-tazobactam (ZOSYN) IVPB 3.375 g        3.375 g 100 mL/hr over 30 Minutes Intravenous  Once 07/25/23 1051 07/25/23 1204        Assessment/Plan POD#12 s/p RIGHT COLECTOMY, DRAINAGE RETROPERITONEAL ABSCESS, 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 Janee Morn for perforated colitis with RTP abscess - CT 8/7 with a small intra-abdominal collection not amenable to percutaneous drainage.  Repeat CT 8/11 with stable size but more anterior collection. IR took down for aspiration vs drainage and CT pelvis showed near resolution of pelvic fluid collection so procedure was cancelled.  - Continue damp to dry dressing changes to midline  wound with saline dampened gauze TID and more frequently whenever saturated.  Abdominal binder to reduce tension on incision. Continue dressing change teaching with patient.  - Continue Augmentin. Afebrile. WBC wnl yesterday.  - WOC c/s for new ostomy. Will need ostomy clinic referral upon discharge. [facility] RN written for. TOC confirmed that patient does still have [facility] set up.  - Continue JP. Plan to keep this at dc as it still appears purulent. Will ask RN to do drain teaching.  - Mobilize. PT following. No f/u recommended. DME written for.  - Pulm toilet - Discussed with patient he may be ready for discharge in the next 24 hours pending family education and comfort with plan. Patient has arranged for his wife to come in tomorrow at 12pm.    ID - Zosyn 8/2 - 8/8, Augmentin 8/8 -  FEN - Regular diet VTE - SCDs, lovenox Foley - out 8/3 and voiding   - Per TR[facility] -  Hyponatremia - 130 Hx HTN - per medicine Recent admit  for elevated troponin, felt likely demand ischemia - left heart cath negative for CAD    LOS: 12 days    Elmer Sow [facility] Surgery 08/06/2023, 9:56 AM Please see Amion for pager number during day hours 7:00am-4:30pm

## 2023-08-06 NOTE — Progress Notes (Signed)
[facility] PROGRESS NOTE    Progress Note  Carl Briggs  ZOX:[id number] DOB: 1971/08/13 DOA: 07/25/2023 PCP: Patient, No Pcp Per     Brief Narrative:   Carl Briggs is an 52 y.o. male past medical history of essential hypertension, anxiety disorder discharged from the hospital on 07/24/2023 after being treated for sepsis due to colitis presents with worsening abdominal pain, CT scan of the abdomen pelvis showed perforation of the ascending colon started empirically on IV antibiotics General Surgery was consulted underwent surgical intervention with a right colectomy, had a perineal abscess and ileostomy on 07/25/2023 with general surgery transfer to the general service care on 07/30/2023   Assessment/Plan:   Perforated colitis with retroperitoneal abscess: Underwent right colectomy drainage placed for retroperitoneal abscess and ileostomy on 07/25/2023. Repeated CT scan of the abdomen pelvis 08/03/2023 showed possible postoperative pelvic abscess. IR was consulted transition from IV Zosyn to oral Augmentin by general surgery. Wound care/ostomy and pain management per surgery. Care was transferred to general surgery on 07/30/2023.  Hypovolemic hyponatremia: Of IV fluids sodium is improving.  Acute metabolic acidosis: Started on by carbonate tablets. Basic metabolic panels pending this morning.  Leukocytosis: Resolved.  Thrombocytosis: Likely reactive.  Essential hypertension: Holding lisinopril, blood pressure is well-controlled.  Anxiety: Continue Lexapro.  Elevated troponins during admission underwent cardiac cath on 07/23/2023 normal coronaries.    DVT prophylaxis: lovenox Family Communication:none    Code Status:     Code Status Orders  (From admission, onward)           Start     Ordered   07/25/23 1218  Full code  Continuous       Question:  By:  Answer:  Consent: discussion documented in EHR   07/25/23 1218           Code Status  History     Date Active Date Inactive Code Status Order ID Comments User Context   07/20/2023 1834 07/24/2023 0144 Full Code [id number]  Alessandra Bevels, MD Inpatient   08/11/2021 0858 08/13/2021 1630 Full Code [id number]  Clydie Braun, MD ED   11/07/2018 1747 11/09/2018 1953 Full Code [id number]  Gery Pray, MD Inpatient         IV Access:   Peripheral IV   Procedures and diagnostic studies:   CT PELVIS LIMITED WO CONTRAST  Result Date: 08/05/2023 CLINICAL DATA:  52 year old gentleman with history of pelvic abscess presented to interventional radiology for CT-guided drainage. EXAM: CT PELVIS WITHOUT CONTRAST TECHNIQUE: Multidetector CT imaging of the pelvis was performed following the standard protocol without intravenous contrast. RADIATION DOSE REDUCTION: This exam was performed according to the departmental dose-optimization program which includes automated exposure control, adjustment of the mA and/or kV according to patient size and/or use of iterative reconstruction technique. COMPARISON:  CT abdomen pelvis 08/03/2023 FINDINGS: Urinary Tract: Excreted contrast noted in the bladder. Small focus of air within the bladder lumen likely related to recent catheterization. Bladder otherwise normal. Bowel: No dilated loops of bowel seen within the visualized pelvis. Sigmoid colon diverticulosis partially visualized. Vascular/Lymphatic: No enlarged lymph nodes are identified within the visualized pelvis. Reproductive:  Prostate is unremarkable. Other: Partially open anterior abdominal wall wound again seen. Right lower quadrant ostomy again identified. Right lower quadrant surgical drain is seen. Previously seen pelvic fluid collection has nearly completely resolved with minimal linear fluid still remaining posterior and superior to the bladder. Mild soft tissue stranding also noted in the presacral region. Musculoskeletal: No suspicious bone lesions identified.  IMPRESSION: Near complete  resolution of pelvic fluid collection, with minimal linear fluid still remaining posterior and superior to the bladder. No drainable abscess identified. Electronically Signed   By: Acquanetta Belling M.D.   On: 08/05/2023 10:37     Medical Consultants:   None.   Subjective:    [facility] no complaints.  Objective:    Vitals:   08/05/23 1515 08/05/23 1916 08/05/23 2317 08/06/23 0347  BP: 106/71 107/72 99/68 110/73  Pulse: (!) 106 98 89 72  Resp: 16 18 16 16   Temp: 98 F (36.7 C) 98.1 F (36.7 C) 98.4 F (36.9 C) 98.3 F (36.8 C)  TempSrc: Oral Oral Oral Oral  SpO2: 97% 100% 94% 97%  Weight:    66.3 kg   SpO2: 97 % O2 Flow Rate (L/min): 2 L/min FiO2 (%):  (not sure what generated a MEWS notification; pt has been green while in my care)   Intake/Output Summary (Last 24 hours) at 08/06/2023 0708 Last data filed at 08/06/2023 0600 Gross per 24 hour  Intake 360 ml  Output 2102 ml  Net -1742 ml   Filed Weights   08/04/23 0323 08/05/23 0300 08/06/23 0347  Weight: 67.4 kg 66.6 kg 66.3 kg    Exam: General exam: In no acute distress. Respiratory system: Good air movement and clear to auscultation. Cardiovascular system: S1 & S2 heard, RRR. No JV. Extremities: No pedal edema. Skin: No rashes, lesions or ulcers Psychiatry: Judgement and insight appear normal. Mood & affect appropriate.    Data Reviewed:    Labs: Basic Metabolic Panel: Recent Labs  Lab 07/31/23 0218 08/01/23 0554 08/02/23 0542 08/03/23 1040 08/04/23 0110 08/05/23 0438  NA 132* 132* 133* 133* 130* 130*  K 4.0 4.0 5.1 4.2 4.2 4.2  CL 100 97* 100 101 102 103  CO2 24 23 20* 19* 19* 17*  GLUCOSE 114* 117* 131* 137* 97 85  BUN 9 7 13 19 17 17   CREATININE 0.67 0.58* 0.78 0.74 0.75 0.58*  CALCIUM 7.8* 8.1* 8.6* 8.4* 8.1* 8.3*  MG 2.3 2.1 2.4  --  2.0 2.0   GFR Estimated Creatinine Clearance: 86.7 mL/min (A) (by C-G formula based on SCr of 0.58 mg/dL (L)). Liver Function Tests: Recent  Labs  Lab 07/31/23 0218 08/05/23 0438  AST 26 23  ALT 32 38  ALKPHOS 79 68  BILITOT 0.6 0.4  PROT 6.1* 6.4*  ALBUMIN 1.9* 2.3*   No results for input(s): "LIPASE", "AMYLASE" in the last 168 hours. No results for input(s): "AMMONIA" in the last 168 hours. Coagulation profile Recent Labs  Lab 08/04/23 1338  INR 1.1   COVID-19 Labs  No results for input(s): "DDIMER", "FERRITIN", "LDH", "CRP" in the last 72 hours.  Lab Results  Component Value Date   SARSCOV2NAA NEGATIVE 07/19/2023   SARSCOV2NAA NEGATIVE 08/11/2021    CBC: Recent Labs  Lab 07/31/23 0218 08/01/23 0554 08/02/23 0542 08/04/23 0110 08/05/23 0438  WBC 13.2* 10.9* 15.4* 10.4 6.6  NEUTROABS 10.9* 8.7* 12.8*  --   --   HGB 11.0* 11.5* 14.0 11.6* 11.0*  HCT 32.7* 33.8* 42.1 34.6* 34.1*  MCV 87.0 88.7 87.9 89.4 89.0  PLT 620* 658* 817* 715* 617*   Cardiac Enzymes: No results for input(s): "CKTOTAL", "CKMB", "CKMBINDEX", "TROPONINI" in the last 168 hours. BNP (last 3 results) No results for input(s): "PROBNP" in the last 8760 hours. CBG: No results for input(s): "GLUCAP" in the last 168 hours. D-Dimer: No results for input(s): "DDIMER" in the  last 72 hours. Hgb A1c: No results for input(s): "HGBA1C" in the last 72 hours. Lipid Profile: No results for input(s): "CHOL", "HDL", "LDLCALC", "TRIG", "CHOLHDL", "LDLDIRECT" in the last 72 hours. Thyroid function studies: No results for input(s): "TSH", "T4TOTAL", "T3FREE", "THYROIDAB" in the last 72 hours.  Invalid input(s): "FREET3" Anemia work up: No results for input(s): "VITAMINB12", "FOLATE", "FERRITIN", "TIBC", "IRON", "RETICCTPCT" in the last 72 hours. Sepsis Labs: Recent Labs  Lab 08/01/23 0554 08/02/23 0542 08/04/23 0110 08/05/23 0438  WBC 10.9* 15.4* 10.4 6.6   Microbiology Recent Results (from the past 240 hour(s))  MRSA Next Gen by PCR, Nasal     Status: None   Collection Time: 08/01/23 12:45 PM   Specimen: Nasal Mucosa; Nasal Swab   Result Value Ref Range Status   MRSA by PCR Next Gen NOT DETECTED NOT DETECTED Final    Comment: (NOTE) The GeneXpert MRSA Assay (FDA approved for NASAL specimens only), is one component of a comprehensive MRSA colonization surveillance program. It is not intended to diagnose MRSA infection nor to guide or monitor treatment for MRSA infections. Test performance is not FDA approved in patients less than 21 years old. Performed at [facility], [address] [place], [place] [address]      Medications:    acetaminophen  1,000 mg Oral Q6H   amoxicillin-clavulanate  1 tablet Oral Q12H   enoxaparin (LOVENOX) injection  40 mg Subcutaneous Q24H   feeding supplement  237 mL Oral BID BM   ibuprofen  600 mg Oral QID   methocarbamol  1,000 mg Oral Q6H   sodium bicarbonate  650 mg Oral TID   tamsulosin  0.4 mg Oral Daily   Continuous Infusions:    LOS: 12 days   Marinda Elk  [facility]  08/06/2023, 7:08 AM

## 2023-08-06 NOTE — Plan of Care (Signed)

## 2023-08-07 LAB — CBC
HCT: 35.8 % — ABNORMAL LOW (ref 39.0–52.0)
Hemoglobin: 12.3 g/dL — ABNORMAL LOW (ref 13.0–17.0)
MCH: 30.1 pg (ref 26.0–34.0)
MCHC: 34.4 g/dL (ref 30.0–36.0)
MCV: 87.5 fL (ref 80.0–100.0)
Platelets: 537 10*3/uL — ABNORMAL HIGH (ref 150–400)
RBC: 4.09 MIL/uL — ABNORMAL LOW (ref 4.22–5.81)
RDW: 13.4 % (ref 11.5–15.5)
WBC: 11 10*3/uL — ABNORMAL HIGH (ref 4.0–10.5)
nRBC: 0 % (ref 0.0–0.2)

## 2023-08-07 LAB — BASIC METABOLIC PANEL
Anion gap: 10 (ref 5–15)
BUN: 13 mg/dL (ref 6–20)
CO2: 20 mmol/L — ABNORMAL LOW (ref 22–32)
Calcium: 8.6 mg/dL — ABNORMAL LOW (ref 8.9–10.3)
Chloride: 101 mmol/L (ref 98–111)
Creatinine, Ser: 0.58 mg/dL — ABNORMAL LOW (ref 0.61–1.24)
GFR, Estimated: >60 mL/min (ref >60)
Glucose, Bld: 92 mg/dL (ref 70–99)
Potassium: 4.3 mmol/L (ref 3.5–5.1)
Sodium: 131 mmol/L — ABNORMAL LOW (ref 135–145)

## 2023-08-07 NOTE — Plan of Care (Signed)
  Problem: Activity: Goal: Ability to return to baseline activity level will improve Outcome: Progressing   Problem: Activity: Goal: Risk for activity intolerance will decrease Outcome: Progressing   Problem: Education: Goal: Understanding of CV disease, CV risk reduction, and recovery process will improve Outcome: Progressing   Problem: Skin Integrity: Goal: Risk for impaired skin integrity will decrease Outcome: Progressing

## 2023-08-07 NOTE — Progress Notes (Signed)
Physical Therapy Treatment Patient Details Name: Carl Briggs MRN: [id number] DOB: [birth year] Today's Date: 08/07/2023   History of Present Illness The pt is a 52 yo male presenting 8/2 with abdominal pain, emesis, and diarrhea. Pt found to have perforated ascending colon and is s/p R colectomy with ileostomy on 8/2. Pt with recent admission for sepsis due to colitis and was d/c home on 8/1. PMH includes: HTN, HLD, and anxiety.    PT Comments  Pt continues to be limited by pain, needing extra time to complete all functional mobility and benefiting from the RW for pain management when ambulating further distances. Practiced bed mobility without bed rails and with the bed flat to simulate home set-up. He was able to perform all functional mobility without assistance or LOB. Will continue to follow acutely.      If plan is discharge home, recommend the following: A little help with bathing/dressing/bathroom;Assistance with cooking/housework;Assist for transportation;Help with stairs or ramp for entrance   Can travel by private vehicle        Equipment Recommendations  Rolling walker (2 wheels);BSC/3in1    Recommendations for Other Services       Precautions / Restrictions Precautions Precautions: Fall Precaution Comments: colostomy; abdominal binder all times; R JP drain Restrictions Weight Bearing Restrictions: No     Mobility  Bed Mobility Overal bed mobility: Needs Assistance Bed Mobility: Rolling, Sidelying to Sit, Sit to Sidelying Rolling: Supervision Sidelying to sit: Supervision     Sit to sidelying: Supervision General bed mobility comments: Extra time due to pain with verbal cues to reach R UE to L EOB to roll without rails today to simulate home set-up. Sidelying <> sit L EOB with extra time and cues for pushing/descending with L UE, supervision for safety    Transfers Overall transfer level: Modified independent Equipment used: None   Sit to Stand:  Modified independent (Device/Increase time)           General transfer comment: Extra time to power up to stand due to pain, no LOB. Prefers EOB to be raised a little to sit back down with less pain.    Ambulation/Gait Ambulation/Gait assistance: Supervision Gait Distance (Feet): 150 Feet Assistive device: Rolling walker (2 wheels) Gait Pattern/deviations: Step-through pattern, Decreased stride length, Trunk flexed Gait velocity: decreased Gait velocity interpretation: 1.31 - 2.62 ft/sec, indicative of limited community ambulator   General Gait Details: Slightly crouched posture, slow and steady pace using RW for pain management. Pt holds posterior legs of RW off ground to avoid noise of them dragging even though cued to keep them on the ground to reduce work for pt. Encouraged pt to get tennis balls for RW legs as needed for home   Stairs             Wheelchair Mobility     Tilt Bed    Modified Rankin (Stroke Patients Only)       Balance Overall balance assessment: Mild deficits observed, not formally tested                                          Cognition Arousal: Alert Behavior During Therapy: [facility] for tasks assessed/performed Overall Cognitive Status: Within Functional Limits for tasks assessed  Exercises      General Comments General comments (skin integrity, edema, etc.): In person interpreter not available and pt preferring to utilize his daughter over the phone to interpreter instead of video interpreter; encouraged walking >/= 3x/day here and at d/c, he verbalized understanding      Pertinent Vitals/Pain Pain Assessment Pain Assessment: Faces Faces Pain Scale: Hurts whole lot Pain Location: abdomen Pain Descriptors / Indicators: Discomfort, Grimacing, Guarding Pain Intervention(s): Limited activity within patient's tolerance, Monitored during session, Repositioned,  Patient requesting pain meds-RN notified    Home Living                          Prior Function            PT Goals (current goals can now be found in the care plan section) Acute Rehab PT Goals Patient Stated Goal: return home PT Goal Formulation: With patient/family Time For Goal Achievement: 08/09/23 Potential to Achieve Goals: Good Progress towards PT goals: Progressing toward goals    Frequency    Min 1X/week      PT Plan Current plan remains appropriate    Co-evaluation              AM-PAC PT "6 Clicks" Mobility   Outcome Measure  Help needed turning from your back to your side while in a flat bed without using bedrails?: None Help needed moving from lying on your back to sitting on the side of a flat bed without using bedrails?: None Help needed moving to and from a bed to a chair (including a wheelchair)?: None Help needed standing up from a chair using your arms (e.g., wheelchair or bedside chair)?: None Help needed to walk in hospital room?: A Little Help needed climbing 3-5 steps with a railing? : A Little 6 Click Score: 22    End of Session   Activity Tolerance: Patient tolerated treatment well Patient left: in bed;with call bell/phone within reach;with bed alarm set Nurse Communication: Mobility status;Patient requests pain meds PT Visit Diagnosis: Other abnormalities of gait and mobility (R26.89);Pain     Time: 3235-5732 PT Time Calculation (min) (ACUTE ONLY): 16 min  Charges:    $Therapeutic Activity: 8-22 mins PT General Charges $$ ACUTE PT VISIT: 1 Visit                     [name], PT, DPT Acute Rehabilitation Services  Office: [phone]    [name] 08/07/2023, 9:01 AM

## 2023-08-07 NOTE — Consult Note (Signed)
WOC Nurse ostomy follow up Stoma type/location: RLQ ileostomy, midline abdominal wound. Abdominal binder in place.  Pouch was changed 08/05/23 by my partner and teaching performed with wife. Today, we were to meet with Victorio Palm, step daughter of patient for additional ostomy teaching.  I called Lupe at 0900 to arrange for a time to perform teaching and she is working on a job site today and cannot make it.  She states she observed a pouch change and recorded it on her phone.  She states she feels comfortable with pouch change.  Her mother (patient wife) had some difficulty with assembling barrier and pouch yesterday and we practiced this.  With interpreter, we discussed measuring stoma for 4-6 weeks and emptying when 1/3 full.  I reminded her about twice weekly pouch changes.  She declined offer to perform pouch change.  She asked about discharge and when the surgery team was coming to meet with her.  I told her my visit did not impact their scheduled meeting.  She is thankful for the assistance. They have 4 pouch sets to take home, have been enrolled in secure start and I remind them about the outpatient clinic.  Will follow through discharge.   Mike Gip MSN, RN, FNP-BC CWON Wound, Ostomy, Continence Nurse Outpatient [facility] [phone] Pager [phone]

## 2023-08-07 NOTE — Progress Notes (Signed)
[facility] PROGRESS NOTE    Progress Note  Carl Briggs  MWU:[id number] DOB: 12-14-1971 DOA: 07/25/2023 PCP: Patient, No Pcp Per     Brief Narrative:   Carl Briggs is an 52 y.o. 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 -Repeated CT scan of the abdomen pelvis 08/03/2023 showed possible postoperative pelvic abscess. -IR was consulted transition from IV Zosyn to oral Augmentin by general surgery. -Wound care/ostomy and pain management per surgery. -Care was transferred to general surgery on 07/30/2023.  Hypovolemic hyponatremia: -Sodium stable at 130 - off IVF at this time  Acute metabolic acidosis: -Started on bicarb tablets. -Repeat BMP essentially unchanged.  Thrombocytosis: Acute phase reactant, monitor as appropriate.  Essential hypertension: Holding lisinopril, blood pressure is currently well-controlled.  Anxiety: Continue Lexapro.  Elevated troponin - Cardiac cath on 07/23/2023 without need for intervention  DVT prophylaxis: enoxaparin (LOVENOX) injection 40 mg Start: 07/26/23 2200    Code Status: Full Code  Family Communication: None available  Code Status:     Code Status Orders  (From admission, onward)            Start     Ordered   07/25/23 1218  Full code  Continuous       Question:  By:  Answer:  Consent: discussion documented in EHR   07/25/23 1218           Code Status History     Date Active Date Inactive Code Status Order ID Comments User Context   07/20/2023 1834 07/24/2023 0144 Full Code [id number]  Alessandra Bevels, MD Inpatient   08/11/2021 0858 08/13/2021 1630 Full Code [id number]  Clydie Braun, MD ED   11/07/2018 1747 11/09/2018 1953 Full Code [id number]  Gery Pray, MD Inpatient         IV Access:   Peripheral IV   Procedures and diagnostic studies:   No results found.   Medical Consultants:   We are.   Subjective:    [facility] no complaints or issues overnight, he notes his abdominal pain is improving, no issues tolerating p.o. having bowel movement.  He does note worsening abdominal pain after ambulation and exertion but otherwise denies fever chills shortness of breath or chest pain.  Objective:    Vitals:   08/06/23 1921 08/06/23 2318 08/07/23 0342 08/07/23 0451  BP: 101/73 114/73 120/78   Pulse: 88 85 88   Resp: 20 20 18    Temp: 97.8 F (36.6 C) 98.4 F (36.9 C) 97.9 F (36.6 C)   TempSrc: Oral Oral Oral   SpO2:   97%   Weight:    66.5 kg    Intake/Output Summary (Last 24 hours) at 08/07/2023 0639 Last data filed at 08/07/2023 0100 Gross per 24 hour  Intake 240 ml  Output 1340 ml  Net -1100 ml   Filed Weights   08/05/23 0300 08/06/23 0347 08/07/23 0451  Weight:  66.6 kg 66.3 kg 66.5 kg    Exam: General:  Pleasantly resting in bed, No acute distress. HEENT:  Normocephalic atraumatic.  Sclerae nonicteric, noninjected.  Extraocular movements intact bilaterally. Neck:  Without mass or deformity.  Trachea is midline. Lungs:  Clear to auscultate bilaterally without rhonchi, wheeze, or rales. Heart:  Regular rate and rhythm.  Without murmurs, rubs, or gallops. Abdomen:  Soft, nondistended.  Bandage and ostomy clean dry intact,  JP drain with minimal questionably purulent fluid. Extremities: Without cyanosis, clubbing, edema, or obvious deformity. Skin:  Warm and dry, no erythema.   Data Reviewed:    Labs: Basic Metabolic Panel: Recent Labs  Lab 08/01/23 0554 08/02/23 0542 08/03/23 1040 08/04/23 0110 08/05/23 0438 08/06/23 0630  NA 132* 133* 133* 130* 130* 130*  K 4.0 5.1 4.2 4.2 4.2 4.5  CL 97* 100 101 102 103 101  CO2 23 20* 19* 19* 17* 21*  GLUCOSE 117* 131* 137* 97 85 91  BUN 7 13 19 17 17 18   CREATININE 0.58* 0.78 0.74 0.75 0.58* 0.61  CALCIUM 8.1* 8.6* 8.4* 8.1* 8.3* 8.5*  MG 2.1 2.4  --  2.0 2.0 2.0   GFR Estimated Creatinine Clearance: 86.7 mL/min (by C-G formula based on SCr of 0.61 mg/dL). Liver Function Tests: Recent Labs  Lab 08/05/23 0438  AST 23  ALT 38  ALKPHOS 68  BILITOT 0.4  PROT 6.4*  ALBUMIN 2.3*   Coagulation profile Recent Labs  Lab 08/04/23 1338  INR 1.1   CBC: Recent Labs  Lab 08/01/23 0554 08/02/23 0542 08/04/23 0110 08/05/23 0438  WBC 10.9* 15.4* 10.4 6.6  NEUTROABS 8.7* 12.8*  --   --   HGB 11.5* 14.0 11.6* 11.0*  HCT 33.8* 42.1 34.6* 34.1*  MCV 88.7 87.9 89.4 89.0  PLT 658* 817* 715* 617*   Sepsis Labs: Recent Labs  Lab 08/01/23 0554 08/02/23 0542 08/04/23 0110 08/05/23 0438  WBC 10.9* 15.4* 10.4 6.6   Microbiology Recent Results (from the past 240 hour(s))  MRSA Next Gen by PCR, Nasal     Status: None   Collection Time: 08/01/23 12:45 PM   Specimen: Nasal Mucosa; Nasal Swab  Result Value Ref Range Status   MRSA by PCR Next Gen NOT DETECTED NOT DETECTED Final    Comment: (NOTE) The GeneXpert MRSA Assay (FDA approved for NASAL specimens only), is one component of a comprehensive MRSA colonization surveillance program. It is not intended to diagnose MRSA infection nor to guide or monitor treatment for MRSA infections. Test performance is not FDA approved in patients less than 68 years old. Performed at [facility], [address], [place], [place] [address]      Medications:    acetaminophen  1,000 mg Oral Q6H   amoxicillin-clavulanate  1 tablet Oral Q12H   enoxaparin (LOVENOX) injection  40 mg Subcutaneous Q24H   feeding supplement  237 mL Oral BID BM   methocarbamol  1,000 mg Oral Q6H   sodium bicarbonate  650 mg Oral TID   tamsulosin  0.4 mg Oral Daily   Continuous Infusions:    LOS: 13 days   Azucena Fallen DO [facility]  08/07/2023, 6:39 AM

## 2023-08-07 NOTE — Progress Notes (Signed)
[facility] Progress Note  13 Days Post-Op  Subjective: Spanish interpreter used.  Seen with RN.  Patient reports increased pain last night that is better controlled this morning. Pain seems to mostly still be incisional but also on the right side. Tolerating soft diet and finishing almost all of his trays. He does have nausea related to po intake but this is improved today. No nausea related to po intake. No vomiting. Having ostomy output. He has learned how to change his midline wound and feels comfortable with this. He has emptied his ostomy bag. He is mobilizing with therapies. Voiding without issues. His wife is planning to come in around 12pm today.   Afebrile. No tachycardia. BP's improved and without hypotension over the last 24 hours.   Objective: Vital signs in last 24 hours: Temp:  [97.5 F (36.4 C)-98.7 F (37.1 C)] 98.7 F (37.1 C) (08/15 0800) Pulse Rate:  [83-95] 95 (08/15 0800) Resp:  [18-20] 18 (08/15 0800) BP: (101-120)/(70-81) 112/81 (08/15 0800) SpO2:  [94 %-100 %] 94 % (08/15 0800) Weight:  [66.5 kg] 66.5 kg (08/15 0451) Last BM Date : 08/05/23  Intake/Output from previous day: 08/14 0701 - 08/15 0700 In: 240 [P.O.:240] Out: 1340 [Urine:725; Drains:15; Stool:600] Intake/Output this shift: No intake/output data recorded.  PE: Gen:  Alert, NAD, pleasant Card:  RRR Pulm:  Rate and effort normal  Abd: Soft, ND, ttp near his incision that seems appropriate. No rigidity or guarding. Open midline with necrotic base moderate drainage and small dehiscence at the superior aspect of the fascia. JP purulent. Ostomy viable with both semi-solid and liquid stool in the bag   Lab Results:  Recent Labs    08/05/23 0438  WBC 6.6  HGB 11.0*  HCT 34.1*  PLT 617*   BMET Recent Labs    08/05/23 0438 08/06/23 0630  NA 130* 130*  K 4.2 4.5  CL 103 101  CO2 17* 21*  GLUCOSE 85 91  BUN 17 18  CREATININE 0.58* 0.61  CALCIUM 8.3* 8.5*    PT/INR Recent Labs    08/04/23 1338  LABPROT 14.8  INR 1.1   CMP     Component Value Date/Time   NA 130 (L) 08/06/2023 0630   NA 141 12/14/2018 1545   K 4.5 08/06/2023 0630   CL 101 08/06/2023 0630   CO2 21 (L) 08/06/2023 0630   GLUCOSE 91 08/06/2023 0630   BUN 18 08/06/2023 0630   BUN 14 12/14/2018 1545   CREATININE 0.61 08/06/2023 0630   CALCIUM 8.5 (L) 08/06/2023 0630   PROT 6.4 (L) 08/05/2023 0438   PROT 7.3 12/14/2018 1545   ALBUMIN 2.3 (L) 08/05/2023 0438   ALBUMIN 4.1 12/14/2018 1545   AST 23 08/05/2023 0438   ALT 38 08/05/2023 0438   ALKPHOS 68 08/05/2023 0438   BILITOT 0.4 08/05/2023 0438   BILITOT 0.4 12/14/2018 1545   GFRNONAA >60 08/06/2023 0630   GFRAA 118 12/14/2018 1545   Lipase     Component Value Date/Time   LIPASE 34 07/25/2023 0512       Studies/Results: No results found.  Anti-infectives: Anti-infectives (From admission, onward)    Start     Dose/Rate Route Frequency Ordered Stop   07/31/23 1000  amoxicillin-clavulanate (AUGMENTIN) 875-125 MG per tablet 1 tablet        1 tablet Oral Every 12 hours 07/31/23 0900     07/26/23 0030  piperacillin-tazobactam (ZOSYN) IVPB 3.375 g  Status:  Discontinued  3.375 g 12.5 mL/hr over 240 Minutes Intravenous Every 8 hours 07/26/23 0019 07/31/23 0900   07/25/23 1400  piperacillin-tazobactam (ZOSYN) IVPB 3.375 g  Status:  Discontinued        3.375 g 12.5 mL/hr over 240 Minutes Intravenous Every 8 hours 07/25/23 1207 07/26/23 0019   07/25/23 1100  piperacillin-tazobactam (ZOSYN) IVPB 3.375 g        3.375 g 100 mL/hr over 30 Minutes Intravenous  Once 07/25/23 1051 07/25/23 1204        Assessment/Plan POD#13 s/p RIGHT COLECTOMY, DRAINAGE RETROPERITONEAL ABSCESS, 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 Janee Morn for perforated colitis with RTP abscess - CT 8/7 with a small intra-abdominal collection not amenable to percutaneous drainage.  Repeat CT 8/11 with stable size but more anterior collection. IR took down  for aspiration vs drainage and CT pelvis showed near resolution of pelvic fluid collection so procedure was cancelled.  - Continue damp to dry dressing changes to midline wound with saline dampened gauze TID and more frequently whenever saturated.  Abdominal binder to reduce tension on incision. Will review picture with attending and see about adding Mepitel. Continue dressing change teaching with patient.  - Continue Augmentin. Will get repeat labs today given increased pain overnight.   - WOC c/s for new ostomy. Will need ostomy clinic referral upon discharge. [facility] RN written for. TOC confirmed that patient does still have [facility] set up.  - Continue JP. Plan to keep this at dc as it still appears purulent.  Continue drain teaching.  - Mobilize. PT following. No f/u recommended. DME written for.  - Pulm toilet - Will plan to meet with his wife today around 12pm to provide update. Will plan to keep today and repeat labs. Have asked PT to work with him again tomorrow morning. Patient in agreement with this plan.    ID - Zosyn 8/2 - 8/8, Augmentin 8/8 -  FEN - Regular diet VTE - SCDs, lovenox Foley - out 8/3 and voiding   - Per [facility] -  Hyponatremia - 130 yesterday. Repeat labs pending.  Hx HTN - per medicine Recent admit for elevated troponin, felt likely demand ischemia - left heart cath negative for CAD    LOS: 13 days    Elmer Sow [facility] 08/07/2023, 11:15 AM Please see Amion for pager number during day hours 7:00am-4:30pm

## 2023-08-07 NOTE — Progress Notes (Signed)
   08/07/23 1938  Assess: MEWS Score  Temp 98.6 F (37 C)  BP 106/84  MAP (mmHg) 88  Pulse Rate (!) 112  Resp 20  SpO2 94 %  O2 Device Room Air  Assess: MEWS Score  MEWS Temp 0  MEWS Systolic 0  MEWS Pulse 2  MEWS RR 0  MEWS LOC 0  MEWS Score 2  MEWS Score Color Yellow  Assess: if the MEWS score is Yellow or Red  Were vital signs accurate and taken at a resting state? Yes  Does the patient meet 2 or more of the SIRS criteria? No  Does the patient have a confirmed or suspected source of infection? Yes  MEWS guidelines implemented  No, previously yellow, continue vital signs every 4 hours  Assess: SIRS CRITERIA  SIRS Temperature  0  SIRS Pulse 1  SIRS Respirations  0  SIRS WBC 0  SIRS Score Sum  1

## 2023-08-08 ENCOUNTER — Other Ambulatory Visit (HOSPITAL_COMMUNITY): Payer: Self-pay

## 2023-08-08 LAB — CBC
HCT: 34.7 % — ABNORMAL LOW (ref 39.0–52.0)
Hemoglobin: 11.6 g/dL — ABNORMAL LOW (ref 13.0–17.0)
MCH: 29.2 pg (ref 26.0–34.0)
MCHC: 33.4 g/dL (ref 30.0–36.0)
MCV: 87.4 fL (ref 80.0–100.0)
Platelets: 539 10*3/uL — ABNORMAL HIGH (ref 150–400)
RBC: 3.97 MIL/uL — ABNORMAL LOW (ref 4.22–5.81)
RDW: 13.5 % (ref 11.5–15.5)
WBC: 8.2 10*3/uL (ref 4.0–10.5)
nRBC: 0 % (ref 0.0–0.2)

## 2023-08-08 LAB — BASIC METABOLIC PANEL
Anion gap: 12 (ref 5–15)
BUN: 16 mg/dL (ref 6–20)
CO2: 23 mmol/L (ref 22–32)
Calcium: 8.9 mg/dL (ref 8.9–10.3)
Chloride: 99 mmol/L (ref 98–111)
Creatinine, Ser: 0.76 mg/dL (ref 0.61–1.24)
GFR, Estimated: >60 mL/min (ref >60)
Glucose, Bld: 103 mg/dL — ABNORMAL HIGH (ref 70–99)
Potassium: 4.5 mmol/L (ref 3.5–5.1)
Sodium: 134 mmol/L — ABNORMAL LOW (ref 135–145)

## 2023-08-08 MED ORDER — METHOCARBAMOL 500 MG PO TABS
500.0000 mg | ORAL_TABLET | Freq: Four times a day (QID) | ORAL | 0 refills | Status: DC | PRN
  Filled 2023-08-08: qty 30, 4d supply, fill #0

## 2023-08-08 MED ORDER — AMOXICILLIN-POT CLAVULANATE 875-125 MG PO TABS
1.0000 | ORAL_TABLET | Freq: Two times a day (BID) | ORAL | 0 refills | Status: AC
Start: 2023-08-08 — End: 2023-08-15
  Filled 2023-08-08: qty 14, 7d supply, fill #0

## 2023-08-08 MED ORDER — SODIUM BICARBONATE 650 MG PO TABS
650.0000 mg | ORAL_TABLET | Freq: Two times a day (BID) | ORAL | 0 refills | Status: DC
  Filled 2023-08-08: qty 30, 15d supply, fill #0

## 2023-08-08 MED ORDER — OXYCODONE HCL 10 MG PO TABS
10.0000 mg | ORAL_TABLET | Freq: Four times a day (QID) | ORAL | 0 refills | Status: DC | PRN
  Filled 2023-08-08: qty 25, 5d supply, fill #0

## 2023-08-08 NOTE — Progress Notes (Signed)
Se habl con el paciente/cuidador sobre las instrucciones para el alta (incluyendo medicamentos) y se le proporcion una copia.  Discharge instructions given with assistance of spanish interpretor. All questions addressed. PIV x 1 removed and patient dressed. CCMD made aware of patient discharge as well, monitor removed. TOC medications received and reviewed with patient and family. All belongings sent with patient-including BSC and walker.

## 2023-08-08 NOTE — Plan of Care (Signed)
  Problem: Coping: Goal: Level of anxiety will decrease Outcome: Progressing   Problem: Elimination: Goal: Will not experience complications related to bowel motility Outcome: Progressing

## 2023-08-08 NOTE — Progress Notes (Incomplete)
Patient ID: Carl Briggs, male   DOB: [birth year], 52 y.o.   MRN: [id number] [facility] Progress Note  14 Days Post-Op  Subjective: CC-  Pain well controlled on oral medications. Tolerating diet without n/v. Colostomy productive. Did well with PT this morning. WBC 8.2, afebrile.   Objective: Vital signs in last 24 hours: Temp:  [97.9 F (36.6 C)-98.9 F (37.2 C)] 97.9 F (36.6 C) (08/16 0745) Pulse Rate:  [84-112] 84 (08/16 0745) Resp:  [14-20] 14 (08/16 0745) BP: (92-110)/(72-84) 92/72 (08/16 0745) SpO2:  [94 %-98 %] 98 % (08/16 0745) Weight:  [64.3 kg] 64.3 kg (08/16 0500) Last BM Date : 08/07/23 (ostomy)  Intake/Output from previous day: 08/15 0701 - 08/16 0700 In: -  Out: 2030 [Urine:1550; Drains:30; Stool:450] Intake/Output this shift: Total I/O In: -  Out: 200 [Urine:200]  PE: Gen:  Alert, NAD, pleasant HEENT: EOM's intact, pupils equal and round Card:  RRR, no M/G/R heard, 2+ DP pulses*** Pulm:  CTAB, no W/R/R, rate and effort normal Abd: Soft, NT/ND, +BS, no HSM, no hernia, incisions C/D/I, drain with minimal sanguinous drainage Ext:  no BUE/BLE edema, calves soft and nontender*** Psych: A&Ox4 *** Skin: no rashes noted, warm and dry  Lab Results:  Recent Labs    08/07/23 1206 08/08/23 0547  WBC 11.0* 8.2  HGB 12.3* 11.6*  HCT 35.8* 34.7*  PLT 537* 539*   BMET Recent Labs    08/07/23 1206 08/08/23 0547  NA 131* 134*  K 4.3 4.5  CL 101 99  CO2 20* 23  GLUCOSE 92 103*  BUN 13 16  CREATININE 0.58* 0.76  CALCIUM 8.6* 8.9   PT/INR No results for input(s): "LABPROT", "INR" in the last 72 hours. CMP     Component Value Date/Time   NA 134 (L) 08/08/2023 0547   NA 141 12/14/2018 1545   K 4.5 08/08/2023 0547   CL 99 08/08/2023 0547   CO2 23 08/08/2023 0547   GLUCOSE 103 (H) 08/08/2023 0547   BUN 16 08/08/2023 0547   BUN 14 12/14/2018 1545   CREATININE 0.76 08/08/2023 0547   CALCIUM 8.9 08/08/2023 0547   PROT 6.4 (L)  08/05/2023 0438   PROT 7.3 12/14/2018 1545   ALBUMIN 2.3 (L) 08/05/2023 0438   ALBUMIN 4.1 12/14/2018 1545   AST 23 08/05/2023 0438   ALT 38 08/05/2023 0438   ALKPHOS 68 08/05/2023 0438   BILITOT 0.4 08/05/2023 0438   BILITOT 0.4 12/14/2018 1545   GFRNONAA >60 08/08/2023 0547   GFRAA 118 12/14/2018 1545   Lipase     Component Value Date/Time   LIPASE 34 07/25/2023 0512       Studies/Results: No results found.  Anti-infectives: Anti-infectives (From admission, onward)    Start     Dose/Rate Route Frequency Ordered Stop   07/31/23 1000  amoxicillin-clavulanate (AUGMENTIN) 875-125 MG per tablet 1 tablet        1 tablet Oral Every 12 hours 07/31/23 0900     07/26/23 0030  piperacillin-tazobactam (ZOSYN) IVPB 3.375 g  Status:  Discontinued        3.375 g 12.5 mL/hr over 240 Minutes Intravenous Every 8 hours 07/26/23 0019 07/31/23 0900   07/25/23 1400  piperacillin-tazobactam (ZOSYN) IVPB 3.375 g  Status:  Discontinued        3.375 g 12.5 mL/hr over 240 Minutes Intravenous Every 8 hours 07/25/23 1207 07/26/23 0019   07/25/23 1100  piperacillin-tazobactam (ZOSYN) IVPB 3.375 g  3.375 g 100 mL/hr over 30 Minutes Intravenous  Once 07/25/23 1051 07/25/23 1204        Assessment/Plan  ***  *** - ***  ID - *** FEN - *** VTE - *** Foley - *** Follow up - ***  Plan ***  I reviewed {Reviewed data:26882::"last 24 h vitals and pain scores","last 48 h intake and output","last 24 h labs and trends","last 24 h imaging results"}.    LOS: 14 days    Franne Forts, [facility] 08/08/2023, 9:43 AM Please see Amion for pager number during day hours 7:00am-4:30pm

## 2023-08-08 NOTE — Progress Notes (Signed)
Physical Therapy Treatment Patient Details Name: Carl Briggs MRN: [id number] DOB: [birth year] Today's Date: 08/08/2023   History of Present Illness The pt is a 52 yo male presenting 8/2 with abdominal pain, emesis, and diarrhea. Pt found to have perforated ascending colon and is s/p R colectomy with ileostomy on 8/2. Pt with recent admission for sepsis due to colitis and was d/c home on 8/1. PMH includes: HTN, HLD, and anxiety.    PT Comments  Pt is reporting and demonstrating less abdominal pain today. The reduced pain allowed him to ambulate further distances of up to ~750 ft without UE support, LOB, or assistance today. Pt was able to exit/enter his bed without assistance while simulating his home environment as well. Educated pt on frequent mobility and limiting lifting heavy objects and listen to his pain/body to understand his limitations. He verbalized understanding. Will continue to follow acutely.     If plan is discharge home, recommend the following: A little help with bathing/dressing/bathroom;Assistance with cooking/housework;Assist for transportation;Help with stairs or ramp for entrance   Can travel by private vehicle        Equipment Recommendations  Rolling walker (2 wheels);BSC/3in1    Recommendations for Other Services       Precautions / Restrictions Precautions Precautions: Fall Precaution Comments: colostomy; abdominal binder all times; R JP drain Restrictions Weight Bearing Restrictions: No     Mobility  Bed Mobility Overal bed mobility: Modified Independent Bed Mobility: Rolling, Sidelying to Sit, Sit to Sidelying Rolling: Modified independent (Device/Increase time) Sidelying to sit: Modified independent (Device/Increase time)     Sit to sidelying: Modified independent (Device/Increase time) General bed mobility comments: Extra time due to pain but pt demonstrating good carryover of education from prior sessions to roll and transition sidelying  <> sit EOB, bed flat and no use of rails to simulate home set-up. No assistance needed    Transfers Overall transfer level: Modified independent Equipment used: None   Sit to Stand: Modified independent (Device/Increase time)           General transfer comment: Extra time to power up to stand due to pain, no LOB. Bed in low position for sit <> stand today    Ambulation/Gait Ambulation/Gait assistance: Modified independent (Device/Increase time) Gait Distance (Feet): 750 Feet Assistive device: None Gait Pattern/deviations: Step-through pattern, Decreased stride length, Trunk flexed Gait velocity: decreased Gait velocity interpretation: 1.31 - 2.62 ft/sec, indicative of limited community ambulator   General Gait Details: Slightly crouched posture, slow and steady pace without use of UEs and no LOB, mod I.   Stairs             Wheelchair Mobility     Tilt Bed    Modified Rankin (Stroke Patients Only)       Balance Overall balance assessment: Mild deficits observed, not formally tested (very mild, more limited by pain)                                          Cognition Arousal: Alert Behavior During Therapy: WFL for tasks assessed/performed Overall Cognitive Status: Within Functional Limits for tasks assessed                                          Exercises  General Comments General comments (skin integrity, edema, etc.): Provided interpretation myself this date, pt reported no questions for PT, disussed ambulating throughout house >/= 3 x/day and to avoid lifting heavy weights (</= 10 lbs pending pain), educating pt to listen to his pain/body to understand his limitations, he verbalized understanding      Pertinent Vitals/Pain Pain Assessment Pain Assessment: 0-10 Pain Score: 7  Pain Location: abdomen Pain Descriptors / Indicators: Discomfort, Grimacing, Guarding Pain Intervention(s): Monitored during session,  Limited activity within patient's tolerance, Premedicated before session, Repositioned    Home Living                          Prior Function            PT Goals (current goals can now be found in the care plan section) Acute Rehab PT Goals Patient Stated Goal: return home PT Goal Formulation: With patient Time For Goal Achievement: 08/09/23 Potential to Achieve Goals: Good Progress towards PT goals: Progressing toward goals    Frequency    Min 1X/week      PT Plan Current plan remains appropriate    Co-evaluation              AM-PAC PT "6 Clicks" Mobility   Outcome Measure  Help needed turning from your back to your side while in a flat bed without using bedrails?: None Help needed moving from lying on your back to sitting on the side of a flat bed without using bedrails?: None Help needed moving to and from a bed to a chair (including a wheelchair)?: None Help needed standing up from a chair using your arms (e.g., wheelchair or bedside chair)?: None Help needed to walk in hospital room?: None Help needed climbing 3-5 steps with a railing? : A Little 6 Click Score: 23    End of Session   Activity Tolerance: Patient tolerated treatment well Patient left: in bed;with call bell/phone within reach;with bed alarm set Nurse Communication: Mobility status PT Visit Diagnosis: Other abnormalities of gait and mobility (R26.89);Pain     Time: 1610-9604 PT Time Calculation (min) (ACUTE ONLY): 16 min  Charges:    $Therapeutic Activity: 8-22 mins PT General Charges $$ ACUTE PT VISIT: 1 Visit                     Carl Briggs, PT, DPT Acute Rehabilitation Services  Office: [phone]    Carl Briggs 08/08/2023, 8:41 AM

## 2023-08-08 NOTE — TOC Transition Note (Signed)
Transition of Care [facility]) - CM/SW Discharge Note   Patient Details  Name: Carl Briggs MRN: [id number] Date of Birth: 03/23/1971  [facility] CM/SW Contact:  Lockie Pares, RN Phone Number: 08/08/2023, 12:28 PM   Clinical Narrative:    Patient discharging home with DME and RN from [facility]. Kelly from [place] notified of discharge. No further needs.   Final next level of care: Home w Home Health Services Barriers to Discharge: No Barriers Identified   Patient Goals and CMS Choice CMS Medicare.gov Compare Post Acute Care list provided to::  (charity referral- no insurance) Choice offered to / list presented to : NA  Discharge Placement                         Discharge Plan and Services Additional resources added to the After Visit Summary for     Discharge Planning Services: CM Consult Post Acute Care Choice: Durable Medical Equipment, Home Health          DME Arranged: Bedside commode, Walker rolling         HH Arranged: RN [facility] Agency: [facility] Date [facility] Agency Contacted: 07/29/23 Time HH Agency Contacted: 1429 Representative spoke with at [facility] Agency: Tresa Endo  Social Determinants of Health (SDOH) Interventions SDOH Screenings   Food Insecurity: No Food Insecurity (07/20/2023)  Housing: Low Risk  (07/20/2023)  Transportation Needs: No Transportation Needs (07/20/2023)  Utilities: Not At Risk (07/20/2023)  Depression (PHQ2-9): Low Risk  (10/19/2019)  Tobacco Use: Low Risk  (08/04/2023)     Readmission Risk Interventions     No data to display

## 2023-08-08 NOTE — Discharge Summary (Signed)
[facility] Discharge Summary   Patient ID: Carl Briggs MRN: [id number] DOB/AGE: 53-03-1971 53 y.o.  Admit date: 07/25/2023 Discharge date: 08/08/2023   Discharge Diagnosis PERFORATED COLITIS WITH RETROPERITONEAL ABSCESS   Consultants Interventional radiology Hospitalist  Imaging: No results found.  Procedures Dr. Janee Morn (07/25/2023) - RIGHT COLECTOMY, DRAINAGE RETROPERITONEAL ABSCESS, ILEOSTOMY  HPI:  Carl Briggs is a 52 y.o. male with history of hypertension, hyperlipidemia, anxiety disorder who was just discharged from the hospital on 07/24/2023 after he was treated for sepsis due to colitis.  Patient was admitted with inflammatory terminal ileitis/ascending colitis, started on Zosyn and then discharged on p.o. Augmentin.  Patient states that he was taking antibiotics as prescribed however his pain did not get better, he also had nausea and felt like vomiting.  He did eat soft food yesterday. Patient says due to worsening pain he came back to ED 8/2. In the ED CT scan of the abdomen/pelvis showed contained perforation in the ascending colon, no widespread free air. General surgery was consulted, and patient will be taken to the OR today for colectomy and possible ileostomy.  Hospital Course: Intraoperatively patient was found to have perforated colitis with retroperitoneal abscess. He underwent right colectomy, drainage retroperitoneal abscess, and ileostomy. Tolerated procedure well and was transferred to the floor. He was kept on IV zosyn postoperatively. Once bowel function returned his diet was advanced as tolerated. He had a repeat CT scan on 8/7 that showed a small intra-abdominal collection that is not amenable to percutaneous drainage. He was transitioned to oral augmentin. Repeat CT scan 8/11 showed a stable size but more anterior collection. Interventional radiology took down for aspiration vs drainage and CT pelvis showed near resolution of  pelvic fluid collection so procedure was cancelled. He developed some necrotic tissue at the base of his wound requiring a few days of dressing changes with Dakins, followed by increased frequency (TID) of dressing changes with normal saline. Surgical drain output decreased in volume but remained purulent, so this remains in place at time of discharge. He will continue augmentin for 7 additional days after discharge. On POD14, the patient was voiding well, tolerating diet, having bowel function, ambulating well, pain well controlled, vital signs stable and felt stable for discharge home with wife.  They received extensive teaching regarding wound and ostomy care, and they felt comfortable with managing this. Patient will follow up as below and knows to call with questions or concerns.    I have personally reviewed the patients medication history on the [place] controlled substance database.    Physical Exam: Gen:  Alert, NAD, pleasant Card:  RRR Pulm:  Rate and effort normal on room air Abd: Soft, ND, appropriately tender at incision. No rigidity or guarding. Open midline with necrotic base moderate drainage and small dehiscence at the superior aspect of the fascia. JP purulent. Ostomy viable with both semi-solid and liquid stool in the bag   Allergies as of 08/08/2023   No Known Allergies      Medication List     STOP taking these medications    hydrochlorothiazide 12.5 MG tablet Commonly known as: HYDRODIURIL   lisinopril 20 MG tablet Commonly known as: ZESTRIL   metoprolol tartrate 25 MG tablet Commonly known as: LOPRESSOR   potassium chloride SA 20 MEQ tablet Commonly known as: KLOR-CON M   thiamine 100 MG tablet Commonly known as: VITAMIN B1       TAKE these medications    ALKA-SELTZER PO Take 2 tablets  by mouth 2 (two) times daily as needed (stomach upset).   amoxicillin-clavulanate 875-125 MG tablet Commonly known as: AUGMENTIN Take 1 tablet by mouth every 12 (twelve)  hours for 7 days. What changed: when to take this   aspirin EC 81 MG tablet Take 1 tablet (81 mg total) by mouth daily. Swallow whole.   dicyclomine 20 MG tablet Commonly known as: BENTYL Take 1 tablet (20 mg total) by mouth 4 (four) times daily -  before meals and at bedtime for 5 days.   docusate sodium 100 MG capsule Commonly known as: COLACE Take 1 capsule (100 mg total) by mouth 2 (two) times daily. What changed:  when to take this reasons to take this   escitalopram 10 MG tablet Commonly known as: LEXAPRO Take 1 tablet (10 mg total) by mouth daily.   folic acid 1 MG tablet Commonly known as: FOLVITE Tome 1 tableta (1 mg en total) por va oral diariamente. (Take 1 tablet (1 mg total) by mouth daily.)   ibuprofen 200 MG tablet Commonly known as: ADVIL Take 400 mg by mouth every 6 (six) hours as needed for headache or moderate pain.   methocarbamol 500 MG tablet Commonly known as: ROBAXIN Take 1-2 tablets (500-1,000 mg total) by mouth every 6 (six) hours as needed for muscle spasms.   Oxycodone HCl 10 MG Tabs Take 1-1.5 tablets (10-15 mg total) by mouth every 6 (six) hours as needed for moderate pain or severe pain (10mg  moderate, 15mg  severe).   pantoprazole 40 MG tablet Commonly known as: PROTONIX Take 1 tablet (40 mg total) by mouth daily.   sodium bicarbonate 650 MG tablet Take 1 tablet (650 mg total) by mouth 2 (two) times daily.   tamsulosin 0.4 MG Caps capsule Commonly known as: FLOMAX Take 1 capsule (0.4 mg total) by mouth daily.   TYLENOL 500 MG tablet Generic drug: acetaminophen Take 500-1,000 mg by mouth every 6 (six) hours as needed for mild pain or headache.               Durable Medical Equipment  (From admission, onward)           Start     Ordered   07/31/23 1416  For home use only DME Walker rolling  Once       Question Answer Comment  Walker: With 5 Inch Wheels   Patient needs a walker to treat with the following condition  H/O exploratory laparotomy      07/31/23 1415   07/31/23 1416  For home use only DME Bedside commode  Once       Question:  Patient needs a bedside commode to treat with the following condition  Answer:  H/O exploratory laparotomy   07/31/23 1415              Follow-up Information     Violeta Gelinas, MD Follow up on 08/20/2023.   Specialty: General Surgery Why: 8:45 am, Arrive 15 minutes prior to your appointment time, Please bring your insurance card and photo ID Contact information: [address] [place] [address] [phone]         Grayce Sessions, NP Follow up.   Specialty: Internal Medicine Why: TIME 2:00 PM DATE : AUG 21 , 2024 Contact information: [address] [place] [address] [phone]         [facility] Follow up.   Specialty: Home Health Services Why: HHRN arranged- they will contact you to schedule (  anticipate first visit within 48 hr post discharge) Contact information: [address] [place] [address] [phone]         [facility] Follow up.   Why: (Adapt) rolling walker and Bedside commode- arranged- to be delivered to room Contact information: [address] [place] [address] [phone]         [facility] Follow up on 08/13/2023.   Specialty: General Surgery Why: This is a nurse only visit for JP drain evaluation/removal. 10 am, Arrive 30 minutes prior to your appointment time, Please bring your insurance card and photo ID Contact information: [address] [place] [address] [phone]                  Signed: Franne Forts, PA-C [facility] 08/08/2023, 10:03 AM Please see Amion for pager number during day hours 7:00am-4:30pm

## 2023-08-08 NOTE — Progress Notes (Signed)
   08/07/23 2317  Assess: MEWS Score  Temp 98.5 F (36.9 C)  BP 100/74  MAP (mmHg) 82  Pulse Rate (!) 106  Resp 20  Level of Consciousness Alert  SpO2 97 %  O2 Device Room Air  Assess: MEWS Score  MEWS Temp 0  MEWS Systolic 1  MEWS Pulse 1  MEWS RR 0  MEWS LOC 0  MEWS Score 2  MEWS Score Color Yellow  Assess: if the MEWS score is Yellow or Red  Were vital signs accurate and taken at a resting state? Yes  Does the patient meet 2 or more of the SIRS criteria? No  Does the patient have a confirmed or suspected source of infection? Yes  MEWS guidelines implemented  No, previously yellow, continue vital signs every 4 hours  Assess: SIRS CRITERIA  SIRS Temperature  0  SIRS Pulse 1  SIRS Respirations  0  SIRS WBC 0  SIRS Score Sum  1

## 2023-08-13 ENCOUNTER — Ambulatory Visit (INDEPENDENT_AMBULATORY_CARE_PROVIDER_SITE_OTHER): Payer: Self-pay | Admitting: Primary Care

## 2023-08-13 VITALS — BP 118/84 | HR 115 | Resp 16 | Wt 139.0 lb

## 2023-08-13 DIAGNOSIS — Z09 Encounter for follow-up examination after completed treatment for conditions other than malignant neoplasm: Secondary | ICD-10-CM

## 2023-08-13 DIAGNOSIS — K631 Perforation of intestine (nontraumatic): Secondary | ICD-10-CM

## 2023-08-13 DIAGNOSIS — Z7689 Persons encountering health services in other specified circumstances: Secondary | ICD-10-CM

## 2023-08-13 NOTE — Progress Notes (Signed)
[facility]   Subjective:   [name] is a 52 y.o. Hispanic male (interpreter Verdie Shire)  presents for hospital follow up and establish care. Admit date to the hospital was 07/25/23, patient was discharged from the hospital on 08/08/23, patient was admitted for: Bowel perforation (HCC) and Pneumoperitoneum. He has an appt with General surgery follow up with Violeta Gelinas, MD (General Surgery) on 08/20/2023. Pain only at incision has medication for that which works. He has a ostomy in place with scant amount of drainage at this visit. He is also concern that no matter what he eats the color is green explain functions of the large intestine, small intestine, and cecum and ileum.  Each part plays a process with digestion depending on where the ostomy is placed is the probable cause of cause of color of BM. Patient has No headache, No chest pain,  - No Nausea, No new weakness tingling or numbness, No Cough - shortness of breath  Past Medical History:  Diagnosis Date   Anxiety    High cholesterol    Hypertension      No Known Allergies    Current Outpatient Medications on File Prior to Visit  Medication Sig Dispense Refill   amoxicillin-clavulanate (AUGMENTIN) 875-125 MG tablet Take 1 tablet by mouth every 12 (twelve) hours for 7 days. 14 tablet 0   aspirin EC 81 MG tablet Take 1 tablet (81 mg total) by mouth daily. Swallow whole. 90 tablet 2   docusate sodium (COLACE) 100 MG capsule Take 1 capsule (100 mg total) by mouth 2 (two) times daily. (Patient taking differently: Take 100 mg by mouth 2 (two) times daily as needed (constipation).) 10 capsule 0   escitalopram (LEXAPRO) 10 MG tablet Take 1 tablet (10 mg total) by mouth daily. 90 tablet 0   folic acid (FOLVITE) 1 MG tablet Take 1 tablet (1 mg total) by mouth daily. 90 tablet 0   ibuprofen (ADVIL) 200 MG tablet Take 400 mg by mouth every 6 (six) hours as needed for headache or moderate pain.     methocarbamol  (ROBAXIN) 500 MG tablet Take 1-2 tablets (500-1,000 mg total) by mouth every 6 (six) hours as needed for muscle spasms. 30 tablet 0   Oxycodone HCl 10 MG TABS Take 1-1.5 tablets (10-15 mg total) by mouth every 6 (six) hours as needed for moderate pain or severe pain (10mg  moderate, 15mg  severe). 25 tablet 0   pantoprazole (PROTONIX) 40 MG tablet Take 1 tablet (40 mg total) by mouth daily. 30 tablet 2   sodium bicarbonate 650 MG tablet Take 1 tablet (650 mg total) by mouth 2 (two) times daily. 30 tablet 0   Sodium Bicarbonate-Citric Acid (ALKA-SELTZER PO) Take 2 tablets by mouth 2 (two) times daily as needed (stomach upset).     tamsulosin (FLOMAX) 0.4 MG CAPS capsule Take 1 capsule (0.4 mg total) by mouth daily. 90 capsule 0   TYLENOL 500 MG tablet Take 500-1,000 mg by mouth every 6 (six) hours as needed for mild pain or headache.     dicyclomine (BENTYL) 20 MG tablet Take 1 tablet (20 mg total) by mouth 4 (four) times daily -  before meals and at bedtime for 5 days. 20 tablet 0   No current facility-administered medications on file prior to visit.     Review of System: Comprehensive ROS Pertinent positive and negative noted in HPI    Objective:  Blood Pressure 118/84 (BP Location: Right Arm, Patient Position: Sitting, Cuff  Size: Normal)   Pulse (Abnormal) 115   Respiration 16   Weight 139 lb (63 kg)   Oxygen Saturation 98%   Body Mass Index 26.59 kg/m   Filed Weights   08/13/23 1408  Weight: 139 lb (63 kg)    Physical Exam:   Generall Appearance: Well nourished, in mild distress. Eyes: PERRLA, EOMs, conjunctiva no swelling or erythema Sinuses: No Frontal/maxillary tenderness ENT/Mouth: Ext aud canals clear, TMs without erythema, bulging.  Hearing normal.  Neck: Supple, thyroid normal.  Respiratory: Respiratory effort normal, BS equal bilaterally without rales, rhonchi, wheezing or stridor.  Cardio: RRR with no MRGs. Brisk peripheral pulses without edema.  Abdomen: Soft, + BS.   tender, ostomy bag  lymphatics: Non tender without lymphadenopathy.  Musculoskeletal: Full ROM, 5/5 strength, normal gait.  Skin: Warm, dry without rashes, lesions, ecchymosis.  Neuro: Cranial nerves intact. Normal muscle tone, no cerebellar symptoms. Sensation intact.  Psych: Awake and oriented X 3, normal affect, Insight and Judgment appropriate.    Assessment:   [name] was seen today for hospitalization follow-up.  Diagnoses and all orders for this visit:  Hospital discharge follow-up 2/2 Bowel perforation (HCC) Follow up with Violeta Gelinas, MD (General Surgery) on 08/20/2023; 8:45 am,  follow up with [facility] (General Surgery) on 08/13/2023; This is a nurse only visit for JP drain evaluation/removal. 10 am   Encounter to establish care    This note has been created with Education officer, environmental. Any transcriptional errors are unintentional.   Grayce Sessions, NP 08/13/2023, 2:43 PM

## 2023-08-22 ENCOUNTER — Ambulatory Visit (HOSPITAL_COMMUNITY)
Admission: RE | Admit: 2023-08-22 | Discharge: 2023-08-22 | Disposition: A | Discharge status: Home / Self Care | Payer: Self-pay | Source: Ambulatory Visit | Attending: General Surgery | Admitting: General Surgery

## 2023-08-22 DIAGNOSIS — Z432 Encounter for attention to ileostomy: Secondary | ICD-10-CM

## 2023-08-22 DIAGNOSIS — L24B3 Irritant contact dermatitis related to fecal or urinary stoma or fistula: Secondary | ICD-10-CM

## 2023-08-22 DIAGNOSIS — K529 Noninfective gastroenteritis and colitis, unspecified: Secondary | ICD-10-CM | POA: Insufficient documentation

## 2023-08-22 DIAGNOSIS — K631 Perforation of intestine (nontraumatic): Secondary | ICD-10-CM | POA: Insufficient documentation

## 2023-08-22 NOTE — Progress Notes (Signed)
[facility]   Reason for visit:  Used virtual interpretive services Donald Pore) today to effectively communicate with patient and wife. RLQ Ileostomy, has had some leaks at home, sometimes multiple times daily. Current pouch has been on for 24 hours.  They are concerned that the pouch is a poor quality and they would like an improved wear time.  I inform them the pouching appliance may not be the right one for the stoma and we will assess and decide  They have not called patient assistance program and do not have adequate supplies.  HPI:  Colitis, Bowel perforation.  Colectomy with ileostomy Past Medical History:  Diagnosis Date   Anxiety    High cholesterol    Hypertension    Family History  Problem Relation Age of Onset   Diabetes Mother    No Known Allergies Current Outpatient Medications  Medication Sig Dispense Refill Last Dose   aspirin EC 81 MG tablet Take 1 tablet (81 mg total) by mouth daily. Swallow whole. 90 tablet 2    dicyclomine (BENTYL) 20 MG tablet Take 1 tablet (20 mg total) by mouth 4 (four) times daily -  before meals and at bedtime for 5 days. 20 tablet 0    docusate sodium (COLACE) 100 MG capsule Take 1 capsule (100 mg total) by mouth 2 (two) times daily. (Patient taking differently: Take 100 mg by mouth 2 (two) times daily as needed (constipation).) 10 capsule 0    escitalopram (LEXAPRO) 10 MG tablet Take 1 tablet (10 mg total) by mouth daily. 90 tablet 0    folic acid (FOLVITE) 1 MG tablet Take 1 tablet (1 mg total) by mouth daily. 90 tablet 0    ibuprofen (ADVIL) 200 MG tablet Take 400 mg by mouth every 6 (six) hours as needed for headache or moderate pain.      methocarbamol (ROBAXIN) 500 MG tablet Take 1-2 tablets (500-1,000 mg total) by mouth every 6 (six) hours as needed for muscle spasms. 30 tablet 0    Oxycodone HCl 10 MG TABS Take 1-1.5 tablets (10-15 mg total) by mouth every 6 (six) hours as needed for moderate pain or severe pain (10mg  moderate,  15mg  severe). 25 tablet 0    pantoprazole (PROTONIX) 40 MG tablet Take 1 tablet (40 mg total) by mouth daily. 30 tablet 2    sodium bicarbonate 650 MG tablet Take 1 tablet (650 mg total) by mouth 2 (two) times daily. 30 tablet 0    Sodium Bicarbonate-Citric Acid (ALKA-SELTZER PO) Take 2 tablets by mouth 2 (two) times daily as needed (stomach upset).      tamsulosin (FLOMAX) 0.4 MG CAPS capsule Take 1 capsule (0.4 mg total) by mouth daily. 90 capsule 0    TYLENOL 500 MG tablet Take 500-1,000 mg by mouth every 6 (six) hours as needed for mild pain or headache.      No current facility-administered medications for this encounter.   ROS  Review of Systems  Constitutional:  Positive for fatigue.       We discuss dehydration and need to drink adequate fluids and recommend electrolyte replacement when weak, dizzy or dry mouth  Gastrointestinal:        RLQ ileostomy, midline incision  Skin:  Positive for color change, rash and wound.       Peristomal skin irritation  Psychiatric/Behavioral:  The patient is nervous/anxious.        Would like a new pouch that he is confident in.   All  other systems reviewed and are negative.  Vital signs:  BP 112/76 (BP Location: Right Arm)   Pulse (!) 3   Temp 98.3 F (36.8 C)   Resp 17   SpO2 97%  Exam:  Physical Exam Vitals reviewed.  Constitutional:      Appearance: Normal appearance.  HENT:     Mouth/Throat:     Mouth: Mucous membranes are moist.  Abdominal:     Palpations: Abdomen is soft.     Comments: RLQ ileostomy  Neurological:     Mental Status: He is alert and oriented to person, place, and time.  Psychiatric:        Mood and Affect: Mood normal.        Behavior: Behavior normal.     Stoma type/location:  RLQ ileostomy, flush along bottom half.  Stomal assessment/size:  1 1/4" pink and moist  is round today, changed from when inpatient in hospital Peristomal assessment:  denuded skin circumferentially.  Has been leaking often.    Treatment options for stomal/peristomal skin: Switch from 2 piece to 1piece convex.  Stoma powder and skin prep.  Will add ostomy belt.   Output: liquid brown stool Ostomy pouching: 1pc. Convex   Education provided:  We perform pouch change. I explain that due to stoma changes, the former appliance is not the best option any longer.  We will switch to 1 piece convex.  I demonstrate stoma powder and skin prep.  Apply barrier ring and cut barrier to fit.  I apply pouch and add an ostomy belt, adjusted for patient .  Interpreter assists in explaining every step and answering questions along the way.  I provide the following samples:  1piece convex pouches Barrier rings Stoma powder Skin prep Ostomy belt.  They are given the number for patient assistance and I explain they will provide pouches free of charge but they must call and answer some questions. They agree.  I speak with their daughter, Gean Maidens via phone and explain what we have done.  She is appreciative of the assistance.  She agrees to call patient assistance for them.      Impression/dx  Ileostomy Contact dermatitis Discussion  See above  1 piece pouch with ostomy belt Plan  See back as needed.  It is out of pocket expense for the clinic and a hardship.  Return as needed.     Visit time: 45 minutes.   Maple Hudson FNP-BC

## 2023-08-22 NOTE — Discharge Instructions (Signed)
We switch to 1 piece convex Stoma powder and skin prep to skin around stoma Barrier ring Add pouch and ostomy belt.

## 2023-08-26 ENCOUNTER — Ambulatory Visit (INDEPENDENT_AMBULATORY_CARE_PROVIDER_SITE_OTHER): Payer: Self-pay

## 2023-08-26 NOTE — Telephone Encounter (Signed)
Attempted to call patient Interpreter: Carl Briggs#[id number] No answer- message left to call nurse triage

## 2023-08-26 NOTE — Telephone Encounter (Signed)
3rd attempt to contact patient no answer.  Summary: Stomach Pain   Patient's daughter called and said that patient has not been able to have a bowel movement since yesterday. Patient's daughter states that belly is bloated and painful today. Patient is not with daughter currently. Patient will need a spanish interpreter.Please call patient at [phone]     Reason for Disposition  Third attempt to contact caller AND no contact made. Phone number verified.  Answer Assessment - Initial Assessment Questions N/A 3rd attempt to contact patient, no answer  Protocols used: No Contact or Duplicate Contact Call-A-AH

## 2023-08-26 NOTE — Telephone Encounter (Signed)
Summary: Stomach Pain   Patient's daughter called and said that patient has not been able to have a bowel movement since yesterday. Patient's daughter states that belly is bloated and painful today. Patient is not with daughter currently. Patient will need a spanish interpreter.Please call patient at [phone]     Using Spanish interpreter, [facility] # [phone], pt. Unavailable.Unable to leave message.

## 2023-09-05 ENCOUNTER — Ambulatory Visit (INDEPENDENT_AMBULATORY_CARE_PROVIDER_SITE_OTHER): Payer: Self-pay

## 2023-09-05 ENCOUNTER — Ambulatory Visit (HOSPITAL_COMMUNITY): Payer: Self-pay | Admitting: Nurse Practitioner

## 2023-09-05 NOTE — Telephone Encounter (Signed)
Chief Complaint: Nausea Symptoms: Moderate nausea unable to eat due to the nausea Frequency: Onset Wednesday Pertinent Negatives: Patient denies vomiting, signs of dehydration Disposition: [] ED /[] Urgent Care (no appt availability in office) / [x] Appointment(In office/virtual)/ []  [place] [facility]/ [] Home Care/ [] Refused Recommended Disposition /[] [place] [facility]/ []  Follow-up with PCP Additional Notes: Permission given from patient to speak to daughter Thora Lance. She says he wants to see Marcelino Duster to make sure nothing is wrong. Advised appointment on Monday.   Summary: Nausea, post colon surgery   The patients daughter called in stating he has been very nauseous since Wednesday. He also had a fever on Tuesday and Wednesday. He is status post colon surgery where the surgeon moved his colon to the side of his stomach. The daughter is concerned about how he is feeling. Please assist patient further.         Reason for Disposition  Nausea lasts > 1 week  Answer Assessment - Initial Assessment Questions 1. NAUSEA SEVERITY: "How bad is the nausea?" (e.g., mild, moderate, severe; dehydration, weight loss)   - MILD: loss of appetite without change in eating habits   - MODERATE: decreased oral intake without significant weight loss, dehydration, or malnutrition   - SEVERE: inadequate caloric or fluid intake, significant weight loss, symptoms of dehydration     Moderate 2. ONSET: "When did the nausea begin?"     Wednesday 3. VOMITING: "Any vomiting?" If Yes, ask: "How many times today?"     No 4. RECURRENT SYMPTOM: "Have you had nausea before?" If Yes, ask: "When was the last time?" "What happened that time?"     N/A 5. CAUSE: "What do you think is causing the nausea?"     N/A  Protocols used: Nausea-A-AH

## 2023-09-06 ENCOUNTER — Other Ambulatory Visit: Payer: Self-pay

## 2023-09-06 ENCOUNTER — Emergency Department (HOSPITAL_COMMUNITY): Payer: Self-pay

## 2023-09-06 ENCOUNTER — Inpatient Hospital Stay (HOSPITAL_COMMUNITY)
Admission: EM | Admit: 2023-09-06 | Discharge: 2023-09-11 | DRG: 863 | Disposition: A | Discharge status: Home Health | Payer: Self-pay | Attending: General Surgery | Admitting: General Surgery

## 2023-09-06 ENCOUNTER — Encounter (HOSPITAL_COMMUNITY): Payer: Self-pay

## 2023-09-06 DIAGNOSIS — I10 Essential (primary) hypertension: Secondary | ICD-10-CM | POA: Diagnosis present

## 2023-09-06 DIAGNOSIS — Y848 Other medical procedures as the cause of abnormal reaction of the patient, or of later complication, without mention of misadventure at the time of the procedure: Secondary | ICD-10-CM | POA: Diagnosis present

## 2023-09-06 DIAGNOSIS — T8143XA Infection following a procedure, organ and space surgical site, initial encounter: Principal | ICD-10-CM | POA: Diagnosis present

## 2023-09-06 DIAGNOSIS — Z79899 Other long term (current) drug therapy: Secondary | ICD-10-CM

## 2023-09-06 DIAGNOSIS — Z9049 Acquired absence of other specified parts of digestive tract: Secondary | ICD-10-CM

## 2023-09-06 DIAGNOSIS — L02211 Cutaneous abscess of abdominal wall: Secondary | ICD-10-CM | POA: Diagnosis present

## 2023-09-06 DIAGNOSIS — Z833 Family history of diabetes mellitus: Secondary | ICD-10-CM

## 2023-09-06 DIAGNOSIS — T8140XA Infection following a procedure, unspecified, initial encounter: Secondary | ICD-10-CM | POA: Diagnosis present

## 2023-09-06 DIAGNOSIS — Z7982 Long term (current) use of aspirin: Secondary | ICD-10-CM

## 2023-09-06 DIAGNOSIS — F419 Anxiety disorder, unspecified: Secondary | ICD-10-CM | POA: Diagnosis present

## 2023-09-06 DIAGNOSIS — E7800 Pure hypercholesterolemia, unspecified: Secondary | ICD-10-CM | POA: Diagnosis present

## 2023-09-06 DIAGNOSIS — Z1152 Encounter for screening for COVID-19: Secondary | ICD-10-CM

## 2023-09-06 DIAGNOSIS — E871 Hypo-osmolality and hyponatremia: Secondary | ICD-10-CM | POA: Diagnosis present

## 2023-09-06 DIAGNOSIS — Z933 Colostomy status: Secondary | ICD-10-CM

## 2023-09-06 DIAGNOSIS — K529 Noninfective gastroenteritis and colitis, unspecified: Secondary | ICD-10-CM

## 2023-09-06 LAB — PROTIME-INR
INR: 1.2 (ref 0.8–1.2)
Prothrombin Time: 15.1 s (ref 11.4–15.2)

## 2023-09-06 LAB — URINALYSIS, MICROSCOPIC (REFLEX): Bacteria, UA: NONE SEEN

## 2023-09-06 LAB — COMPREHENSIVE METABOLIC PANEL
ALT: 63 U/L — ABNORMAL HIGH (ref 0–44)
AST: 48 U/L — ABNORMAL HIGH (ref 15–41)
Albumin: 2.5 g/dL — ABNORMAL LOW (ref 3.5–5.0)
Alkaline Phosphatase: 149 U/L — ABNORMAL HIGH (ref 38–126)
Anion gap: 13 (ref 5–15)
BUN: 10 mg/dL (ref 6–20)
CO2: 16 mmol/L — ABNORMAL LOW (ref 22–32)
Calcium: 8.5 mg/dL — ABNORMAL LOW (ref 8.9–10.3)
Chloride: 94 mmol/L — ABNORMAL LOW (ref 98–111)
Creatinine, Ser: 0.7 mg/dL (ref 0.61–1.24)
GFR, Estimated: >60 mL/min (ref >60)
Glucose, Bld: 130 mg/dL — ABNORMAL HIGH (ref 70–99)
Potassium: 4.1 mmol/L (ref 3.5–5.1)
Sodium: 123 mmol/L — ABNORMAL LOW (ref 135–145)
Total Bilirubin: 1.1 mg/dL (ref 0.3–1.2)
Total Protein: 7.9 g/dL (ref 6.5–8.1)

## 2023-09-06 LAB — URINALYSIS, W/ REFLEX TO CULTURE (INFECTION SUSPECTED)
Bacteria, UA: NONE SEEN
Bilirubin Urine: NEGATIVE
Glucose, UA: NEGATIVE mg/dL
Ketones, ur: NEGATIVE mg/dL
Leukocytes,Ua: NEGATIVE
Nitrite: NEGATIVE
Protein, ur: NEGATIVE mg/dL
Specific Gravity, Urine: <1.005 — ABNORMAL LOW (ref 1.005–1.030)
pH: 6 (ref 5.0–8.0)

## 2023-09-06 LAB — URINALYSIS, ROUTINE W REFLEX MICROSCOPIC
Glucose, UA: NEGATIVE mg/dL
Ketones, ur: NEGATIVE mg/dL
Leukocytes,Ua: NEGATIVE
Nitrite: NEGATIVE
Protein, ur: 30 mg/dL — AB
Specific Gravity, Urine: 1.02 (ref 1.005–1.030)
pH: 6 (ref 5.0–8.0)

## 2023-09-06 LAB — LIPASE, BLOOD: Lipase: 31 U/L (ref 11–51)

## 2023-09-06 LAB — CBC
HCT: 33.1 % — ABNORMAL LOW (ref 39.0–52.0)
Hemoglobin: 10.9 g/dL — ABNORMAL LOW (ref 13.0–17.0)
MCH: 27.9 pg (ref 26.0–34.0)
MCHC: 32.9 g/dL (ref 30.0–36.0)
MCV: 84.9 fL (ref 80.0–100.0)
Platelets: 450 10*3/uL — ABNORMAL HIGH (ref 150–400)
RBC: 3.9 MIL/uL — ABNORMAL LOW (ref 4.22–5.81)
RDW: 14 % (ref 11.5–15.5)
WBC: 23.3 10*3/uL — ABNORMAL HIGH (ref 4.0–10.5)
nRBC: 0 % (ref 0.0–0.2)

## 2023-09-06 LAB — RESP PANEL BY RT-PCR (RSV, FLU A&B, COVID)  RVPGX2
Influenza A by PCR: NEGATIVE
Influenza B by PCR: NEGATIVE
Resp Syncytial Virus by PCR: NEGATIVE
SARS Coronavirus 2 by RT PCR: NEGATIVE

## 2023-09-06 LAB — C DIFFICILE QUICK SCREEN W PCR REFLEX
C Diff antigen: NEGATIVE
C Diff interpretation: NOT DETECTED
C Diff toxin: NEGATIVE

## 2023-09-06 LAB — SODIUM: Sodium: 127 mmol/L — ABNORMAL LOW (ref 135–145)

## 2023-09-06 LAB — I-STAT CG4 LACTIC ACID, ED: Lactic Acid, Venous: 1.1 mmol/L (ref 0.5–1.9)

## 2023-09-06 LAB — APTT: aPTT: 34 s (ref 24–36)

## 2023-09-06 MED ORDER — LACTATED RINGERS IV BOLUS
1000.0000 mL | Freq: Once | INTRAVENOUS | Status: AC
  Administered 2023-09-06: 1000 mL via INTRAVENOUS

## 2023-09-06 MED ORDER — ONDANSETRON 4 MG PO TBDP: 4.0000 mg | ORAL_TABLET | Freq: Four times a day (QID) | ORAL | Status: DC | PRN

## 2023-09-06 MED ORDER — HYDROMORPHONE HCL 1 MG/ML IJ SOLN
0.5000 mg | INTRAMUSCULAR | Status: DC | PRN
  Administered 2023-09-06: 0.5 mg via INTRAVENOUS
  Filled 2023-09-06: qty 0.5

## 2023-09-06 MED ORDER — HYDROMORPHONE HCL 1 MG/ML IJ SOLN
1.0000 mg | INTRAMUSCULAR | Status: DC | PRN
  Administered 2023-09-06 – 2023-09-08 (×10): 1 mg via INTRAVENOUS
  Filled 2023-09-06 (×10): qty 1

## 2023-09-06 MED ORDER — ONDANSETRON HCL 4 MG/2ML IJ SOLN
4.0000 mg | Freq: Four times a day (QID) | INTRAMUSCULAR | Status: DC | PRN
  Administered 2023-09-06: 4 mg via INTRAVENOUS
  Filled 2023-09-06: qty 2

## 2023-09-06 MED ORDER — HYDROMORPHONE HCL 1 MG/ML IJ SOLN
0.5000 mg | Freq: Once | INTRAMUSCULAR | Status: AC
  Administered 2023-09-06: 0.5 mg via INTRAVENOUS
  Filled 2023-09-06: qty 1

## 2023-09-06 MED ORDER — POLYETHYLENE GLYCOL 3350 17 G PO PACK: 17.0000 g | PACK | Freq: Every day | ORAL | Status: DC | PRN

## 2023-09-06 MED ORDER — SODIUM CHLORIDE 0.9 % IV SOLN
2.0000 g | Freq: Once | INTRAVENOUS | Status: AC
  Administered 2023-09-06: 2 g via INTRAVENOUS
  Filled 2023-09-06: qty 12.5

## 2023-09-06 MED ORDER — SODIUM CHLORIDE 0.9 % IV SOLN: INTRAVENOUS | Status: DC

## 2023-09-06 MED ORDER — SIMETHICONE 80 MG PO CHEW: 40.0000 mg | CHEWABLE_TABLET | Freq: Four times a day (QID) | ORAL | Status: DC | PRN

## 2023-09-06 MED ORDER — IOHEXOL 350 MG/ML SOLN
75.0000 mL | Freq: Once | INTRAVENOUS | Status: AC | PRN
  Administered 2023-09-06: 75 mL via INTRAVENOUS

## 2023-09-06 MED ORDER — PIPERACILLIN-TAZOBACTAM 3.375 G IVPB
3.3750 g | Freq: Three times a day (TID) | INTRAVENOUS | Status: DC
  Administered 2023-09-06 – 2023-09-11 (×16): 3.375 g via INTRAVENOUS
  Filled 2023-09-06 (×16): qty 50

## 2023-09-06 MED ORDER — SODIUM BICARBONATE 650 MG PO TABS
650.0000 mg | ORAL_TABLET | Freq: Two times a day (BID) | ORAL | Status: DC
  Administered 2023-09-06 – 2023-09-11 (×11): 650 mg via ORAL
  Filled 2023-09-06 (×11): qty 1

## 2023-09-06 MED ORDER — ACETAMINOPHEN 650 MG RE SUPP: 650.0000 mg | Freq: Four times a day (QID) | RECTAL | Status: DC | PRN

## 2023-09-06 MED ORDER — METHOCARBAMOL 1000 MG/10ML IJ SOLN
500.0000 mg | Freq: Three times a day (TID) | INTRAVENOUS | Status: DC | PRN
  Filled 2023-09-06: qty 5

## 2023-09-06 MED ORDER — ENOXAPARIN SODIUM 40 MG/0.4ML IJ SOSY
40.0000 mg | PREFILLED_SYRINGE | INTRAMUSCULAR | Status: DC
  Administered 2023-09-06 – 2023-09-11 (×6): 40 mg via SUBCUTANEOUS
  Filled 2023-09-06 (×6): qty 0.4

## 2023-09-06 MED ORDER — METHOCARBAMOL 500 MG PO TABS
500.0000 mg | ORAL_TABLET | Freq: Four times a day (QID) | ORAL | Status: DC | PRN
  Administered 2023-09-09 – 2023-09-10 (×2): 500 mg via ORAL
  Filled 2023-09-06 (×2): qty 1

## 2023-09-06 MED ORDER — DIPHENHYDRAMINE HCL 25 MG PO CAPS: 25.0000 mg | ORAL_CAPSULE | Freq: Four times a day (QID) | ORAL | Status: DC | PRN

## 2023-09-06 MED ORDER — PANTOPRAZOLE SODIUM 40 MG IV SOLR
40.0000 mg | Freq: Every day | INTRAVENOUS | Status: DC
  Administered 2023-09-06 – 2023-09-07 (×2): 40 mg via INTRAVENOUS
  Filled 2023-09-06 (×2): qty 10

## 2023-09-06 MED ORDER — DIPHENHYDRAMINE HCL 50 MG/ML IJ SOLN: 25.0000 mg | Freq: Four times a day (QID) | INTRAMUSCULAR | Status: DC | PRN

## 2023-09-06 MED ORDER — PROCHLORPERAZINE MALEATE 5 MG PO TABS: 10.0000 mg | ORAL_TABLET | Freq: Four times a day (QID) | ORAL | Status: DC | PRN

## 2023-09-06 MED ORDER — METRONIDAZOLE 500 MG/100ML IV SOLN
500.0000 mg | Freq: Once | INTRAVENOUS | Status: AC
  Administered 2023-09-06: 500 mg via INTRAVENOUS
  Filled 2023-09-06: qty 100

## 2023-09-06 MED ORDER — VANCOMYCIN HCL 1500 MG/300ML IV SOLN
1500.0000 mg | Freq: Once | INTRAVENOUS | Status: AC
  Administered 2023-09-06: 1500 mg via INTRAVENOUS
  Filled 2023-09-06: qty 300

## 2023-09-06 MED ORDER — OXYCODONE HCL 5 MG PO TABS
5.0000 mg | ORAL_TABLET | ORAL | Status: DC | PRN
  Administered 2023-09-06 (×2): 10 mg via ORAL
  Filled 2023-09-06 (×2): qty 2

## 2023-09-06 MED ORDER — TAMSULOSIN HCL 0.4 MG PO CAPS
0.4000 mg | ORAL_CAPSULE | Freq: Every day | ORAL | Status: DC
  Administered 2023-09-06 – 2023-09-11 (×6): 0.4 mg via ORAL
  Filled 2023-09-06 (×6): qty 1

## 2023-09-06 MED ORDER — LACTATED RINGERS IV BOLUS (SEPSIS)
1000.0000 mL | Freq: Once | INTRAVENOUS | Status: AC
  Administered 2023-09-06: 1000 mL via INTRAVENOUS

## 2023-09-06 MED ORDER — VANCOMYCIN HCL IN DEXTROSE 1-5 GM/200ML-% IV SOLN: 1000.0000 mg | Freq: Once | INTRAVENOUS | Status: DC

## 2023-09-06 MED ORDER — ACETAMINOPHEN 325 MG PO TABS
650.0000 mg | ORAL_TABLET | Freq: Four times a day (QID) | ORAL | Status: DC | PRN
  Administered 2023-09-10: 650 mg via ORAL
  Filled 2023-09-06: qty 2

## 2023-09-06 MED ORDER — ESCITALOPRAM OXALATE 10 MG PO TABS
10.0000 mg | ORAL_TABLET | Freq: Every day | ORAL | Status: DC
  Administered 2023-09-06 – 2023-09-11 (×6): 10 mg via ORAL
  Filled 2023-09-06 (×6): qty 1

## 2023-09-06 MED ORDER — HYDRALAZINE HCL 20 MG/ML IJ SOLN: 10.0000 mg | INTRAMUSCULAR | Status: DC | PRN

## 2023-09-06 MED ORDER — PROCHLORPERAZINE EDISYLATE 10 MG/2ML IJ SOLN: 5.0000 mg | Freq: Four times a day (QID) | INTRAMUSCULAR | Status: DC | PRN

## 2023-09-06 NOTE — Progress Notes (Signed)
MD notified of pain unrelieved by meds. MD ordered increase in dilaudid.

## 2023-09-06 NOTE — Progress Notes (Signed)
ED Pharmacy Antibiotic Sign Off An antibiotic consult was received from an ED provider for vancomycin per pharmacy dosing for sepsis of unknown source. A chart review was completed to assess appropriateness.   The following one time order(s) were placed:   Cefepime 2g IV x1 per MD Vancomycin 1500 mg IV x1 Metronidazole 500 mg IV x1 per MD  Further antibiotic and/or antibiotic pharmacy consults should be ordered by the admitting provider if indicated.   Thank you for allowing pharmacy to be a part of this patient's care.   Arabella Merles, [facility]  Clinical Pharmacist 09/06/23 4:45 AM

## 2023-09-06 NOTE — ED Notes (Signed)
ED TO INPATIENT HANDOFF REPORT  ED Nurse Name and Phone #: Marcelino Duster [phone]  S Name/Age/Gender Carl Briggs 52 y.o. male Room/Bed: 005C/005C  Code Status   Code Status: Full Code  Home/SNF/Other Home Patient oriented to: self, place, time, and situation Is this baseline? 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 Tachycardic at triage .    Allergies No Known Allergies  Level of Care/Admitting Diagnosis ED Disposition     ED Disposition  Admit   Condition  --   Comment  Hospital Area: [facility] [[id number]]  Level of Care: Med-Surg [[id number]]  May admit patient to [facility] or [facility] if equivalent level of care is available:: No  Covid Evaluation: Asymptomatic - no recent exposure (last 10 days) testing not required  Diagnosis: Postoperative infection [[id number]]  Admitting Physician: CCS, MD [[id number]]  Attending Physician: CCS, MD [[id number]]  Certification:: I certify this patient will need inpatient services for at least 2 midnights  Expected Medical Readiness: 09/09/2023          B Medical/Surgery History Past Medical History:  Diagnosis Date   Anxiety    High cholesterol    Hypertension    Past Surgical History:  Procedure Laterality Date   COLECTOMY N/A 07/25/2023   Procedure: OPEN  RIGHT COLECTOMY;  Surgeon: Violeta Gelinas, MD;  Location: [facility] OR;  Service: General;  Laterality: N/A;   COLONOSCOPY WITH PROPOFOL N/A 11/09/2018   Procedure: COLONOSCOPY WITH PROPOFOL;  Surgeon: Willis Modena, MD;  Location: [facility] ENDOSCOPY;  Service: Endoscopy;  Laterality: N/A;   LEFT HEART CATH AND CORONARY ANGIOGRAPHY N/A 07/23/2023   Procedure: LEFT HEART CATH AND CORONARY ANGIOGRAPHY;  Surgeon: Kathleene Hazel, MD;  Location: [facility] INVASIVE CV LAB;  Service:  Cardiovascular;  Laterality: N/A;     A IV Location/Drains/Wounds Patient Lines/Drains/Airways Status     Active Line/Drains/Airways     Name Placement date Placement time Site Days   Peripheral IV 09/06/23 20 G Right Antecubital 09/06/23  0456  Antecubital  less than 1   Peripheral IV 09/06/23 20 G Anterior;Distal;Right Forearm 09/06/23  0503  Forearm  less than 1   Closed System Drain Inferior 07/25/23  1500  --  43   Colostomy RLQ 07/25/23  1440  RLQ  43            Intake/Output Last 24 hours  Intake/Output Summary (Last 24 hours) at 09/06/2023 0853 Last data filed at 09/06/2023 1191 Gross per 24 hour  Intake 1200 ml  Output --  Net 1200 ml    Labs/Imaging Results for orders placed or performed during the hospital encounter of 09/06/23 (from the past 48 hour(s))  Lipase, blood     Status: None   Collection Time: 09/06/23  3:23 AM  Result Value Ref Range   Lipase 31 11 - 51 U/L    Comment: Performed at [facility], [address] [place], [place] [address]  Comprehensive metabolic panel     Status: Abnormal   Collection Time: 09/06/23  3:23 AM  Result Value Ref Range   Sodium 123 (L) 135 - 145 mmol/L   Potassium 4.1 3.5 - 5.1 mmol/L   Chloride 94 (L) 98 - 111 mmol/L   CO2 16 (L) 22 - 32 mmol/L   Glucose, Bld 130 (H) 70 - 99  mg/dL    Comment: Glucose reference range applies only to samples taken after fasting for at least 8 hours.   BUN 10 6 - 20 mg/dL   Creatinine, Ser 7.82 0.61 - 1.24 mg/dL   Calcium 8.5 (L) 8.9 - 10.3 mg/dL   Total Protein 7.9 6.5 - 8.1 g/dL   Albumin 2.5 (L) 3.5 - 5.0 g/dL   AST 48 (H) 15 - 41 U/L   ALT 63 (H) 0 - 44 U/L   Alkaline Phosphatase 149 (H) 38 - 126 U/L   Total Bilirubin 1.1 0.3 - 1.2 mg/dL   GFR, Estimated >95 >62 mL/min    Comment: (NOTE) Calculated using the CKD-EPI Creatinine Equation (2021)    Anion gap 13 5 - 15    Comment: Performed at [facility], [address], [place], [place] [address]  CBC      Status: Abnormal   Collection Time: 09/06/23  3:23 AM  Result Value Ref Range   WBC 23.3 (H) 4.0 - 10.5 K/uL   RBC 3.90 (L) 4.22 - 5.81 MIL/uL   Hemoglobin 10.9 (L) 13.0 - 17.0 g/dL   HCT 57.8 (L) 46.9 - 62.9 %   MCV 84.9 80.0 - 100.0 fL   MCH 27.9 26.0 - 34.0 pg   MCHC 32.9 30.0 - 36.0 g/dL   RDW 52.8 41.3 - 24.4 %   Platelets 450 (H) 150 - 400 K/uL   nRBC 0.0 0.0 - 0.2 %    Comment: Performed at [facility], [address] [place], [place] [address]  Urinalysis, Routine w reflex microscopic -Urine, Clean Catch     Status: Abnormal   Collection Time: 09/06/23  4:09 AM  Result Value Ref Range   Color, Urine YELLOW YELLOW   APPearance CLOUDY (A) CLEAR   Specific Gravity, Urine 1.020 1.005 - 1.030   pH 6.0 5.0 - 8.0   Glucose, UA NEGATIVE NEGATIVE mg/dL   Hgb urine dipstick TRACE (A) NEGATIVE   Bilirubin Urine SMALL (A) NEGATIVE   Ketones, ur NEGATIVE NEGATIVE mg/dL   Protein, ur 30 (A) NEGATIVE mg/dL   Nitrite NEGATIVE NEGATIVE   Leukocytes,Ua NEGATIVE NEGATIVE    Comment: Performed at [facility], [address] [place], [place] [address]  Urinalysis, Microscopic (reflex)     Status: None   Collection Time: 09/06/23  4:09 AM  Result Value Ref Range   RBC / HPF 0-5 0 - 5 RBC/hpf   WBC, UA 0-5 0 - 5 WBC/hpf   Bacteria, UA NONE SEEN NONE SEEN   Squamous Epithelial / HPF 0-5 0 - 5 /HPF   Mucus PRESENT     Comment: Performed at [facility], [address] [place], [place] [address]  I-Stat Lactic Acid, ED     Status: None   Collection Time: 09/06/23  5:16 AM  Result Value Ref Range   Lactic Acid, Venous 1.1 0.5 - 1.9 mmol/L  Protime-INR     Status: None   Collection Time: 09/06/23  5:16 AM  Result Value Ref Range   Prothrombin Time 15.1 11.4 - 15.2 seconds   INR 1.2 0.8 - 1.2    Comment: (NOTE) INR goal varies based on device and disease states. Performed at [facility], [address] [place], [place] [address]   APTT     Status: None    Collection Time: 09/06/23  5:16 AM  Result Value Ref Range   aPTT 34 24 -  36 seconds    Comment: Performed at [facility], [address], [place], [place] [address]  Resp panel by RT-PCR (RSV, Flu A&B, Covid) Anterior Nasal Swab     Status: None   Collection Time: 09/06/23  5:20 AM   Specimen: Anterior Nasal Swab  Result Value Ref Range   SARS Coronavirus 2 by RT PCR NEGATIVE NEGATIVE   Influenza A by PCR NEGATIVE NEGATIVE   Influenza B by PCR NEGATIVE NEGATIVE    Comment: (NOTE) The Xpert Xpress SARS-CoV-2/FLU/RSV plus assay is intended as an aid in the diagnosis of influenza from Nasopharyngeal swab specimens and should not be used as a sole basis for treatment. Nasal washings and aspirates are unacceptable for Xpert Xpress SARS-CoV-2/FLU/RSV testing.  Fact Sheet for Patients: BloggerCourse.com  Fact Sheet for Healthcare Providers: SeriousBroker.it  This test is not yet approved or cleared by the [place] FDA and has been authorized for detection and/or diagnosis of SARS-CoV-2 by FDA under an Emergency Use Authorization (EUA). This EUA will remain in effect (meaning this test can be used) for the duration of the COVID-19 declaration under Section 564(b)(1) of the Act, 21 U.S.C. section 360bbb-3(b)(1), unless the authorization is terminated or revoked.     Resp Syncytial Virus by PCR NEGATIVE NEGATIVE    Comment: (NOTE) Fact Sheet for Patients: BloggerCourse.com  Fact Sheet for Healthcare Providers: SeriousBroker.it  This test is not yet approved or cleared by the [place] FDA and has been authorized for detection and/or diagnosis of SARS-CoV-2 by FDA under an Emergency Use Authorization (EUA). This EUA will remain in effect (meaning this test can be used) for the duration of the COVID-19 declaration under Section 564(b)(1) of the Act, 21 U.S.C. section  360bbb-3(b)(1), unless the authorization is terminated or revoked.  Performed at [facility], [address] [place], [place] [address]   Urinalysis, w/ Reflex to Culture (Infection Suspected) -Urine, Clean Catch     Status: Abnormal   Collection Time: 09/06/23  7:03 AM  Result Value Ref Range   Specimen Source URINE, CLEAN CATCH    Color, Urine YELLOW YELLOW   APPearance CLEAR CLEAR   Specific Gravity, Urine <1.005 (L) 1.005 - 1.030   pH 6.0 5.0 - 8.0   Glucose, UA NEGATIVE NEGATIVE mg/dL   Hgb urine dipstick TRACE (A) NEGATIVE   Bilirubin Urine NEGATIVE NEGATIVE   Ketones, ur NEGATIVE NEGATIVE mg/dL   Protein, ur NEGATIVE NEGATIVE mg/dL   Nitrite NEGATIVE NEGATIVE   Leukocytes,Ua NEGATIVE NEGATIVE   Squamous Epithelial / HPF 0-5 0 - 5 /HPF   WBC, UA 0-5 0 - 5 WBC/hpf    Comment: Reflex urine culture not performed if WBC <=10, OR if Squamous epithelial cells >5. If Squamous epithelial cells >5, suggest recollection.   RBC / HPF 0-5 0 - 5 RBC/hpf   Bacteria, UA NONE SEEN NONE SEEN    Comment: Performed at [facility], [address] [place], [place] [address]   CT Angio Chest PE W and/or Wo Contrast  Result Date: 09/06/2023 CLINICAL DATA:  Chronic dyspnea with chest wall with pleural disease suspected EXAM: CT ANGIOGRAPHY CHEST CT ABDOMEN AND PELVIS WITH CONTRAST TECHNIQUE: Multidetector CT imaging of the chest was performed using the standard protocol during bolus administration of intravenous contrast. Multiplanar CT image reconstructions and MIPs were obtained to evaluate the vascular anatomy. Multidetector CT imaging of the abdomen and pelvis was performed using the standard protocol during bolus administration of intravenous  contrast. RADIATION DOSE REDUCTION: This exam was performed according to the departmental dose-optimization program which includes automated exposure control, adjustment of the mA and/or kV according to patient size and/or use of iterative  reconstruction technique. CONTRAST:  75mL OMNIPAQUE IOHEXOL 350 MG/ML SOLN COMPARISON:  07/20/2023 chest CT.  08/03/2023 abdominal CT FINDINGS: CTA CHEST FINDINGS Cardiovascular: Satisfactory opacification of the pulmonary arteries to the segmental level. No evidence of pulmonary embolism. Normal heart size. No pericardial effusion. Mediastinum/Nodes: Granulomatous type calcification within left mediastinal lymph node. No mass or worrisome node. Lungs/Pleura: Low volume chest with bands of atelectasis in the lower lungs. Calcified granuloma in the left upper lobe. There is no edema, consolidation, effusion, or pneumothorax. 4 mm pulmonary nodule in the left lower lobe that is stable from 2022 and considered benign. Musculoskeletal: No acute or aggressive finding Review of the MIP images confirms the above findings. CT ABDOMEN and PELVIS FINDINGS Hepatobiliary: No significant liver finding.Cholelithiasis. Pancreas: Unremarkable. Spleen: Unremarkable. Adrenals/Urinary Tract: Negative adrenals. No hydronephrosis or stone. Unremarkable bladder. Stomach/Bowel: Ileostomy. Distal small bowel loops show wall thickening and submucosal edema and is associated with roughly tubular rim enhancing collection which insinuates throughout the right abdomen, spanning the pyloric region and right subhepatic space to the level of the right lower quadrant where adjacent to the sigmoid colon. This abscess measures up to 2 cm in thickness and roughly follows the course of previously seen drain, including extending through the right rectus abdominus into the subcutaneous space. There is some degree of slow transit through the edematous small bowel loops with fecalized contents and mild small bowel distension above the affected segments. Pelvic fluid collections have resolved. Vascular/Lymphatic: No acute vascular abnormality. Expected thickening of mesenteric lymph nodes. Reproductive:No pathologic findings. Other: No ascites or  pneumoperitoneum. Musculoskeletal: No acute abnormalities. Review of the MIP images confirms the above findings. IMPRESSION: Insinuating abscess in the right abdomen spanning the subhepatic space to iliac fossa, up to 2 cm in thickness and largely following the path of the surgical drain seen on prior, including extending through the right rectus abdominus into the subcutaneous space. Enteritis of adjacent small bowel loops with slow transit. Low volume chest with atelectasis.  Negative for pulmonary embolism. Cholelithiasis. Electronically Signed   By: Tiburcio Pea M.D.   On: 09/06/2023 06:31   CT ABDOMEN PELVIS W CONTRAST  Result Date: 09/06/2023 CLINICAL DATA:  Chronic dyspnea with chest wall with pleural disease suspected EXAM: CT ANGIOGRAPHY CHEST CT ABDOMEN AND PELVIS WITH CONTRAST TECHNIQUE: Multidetector CT imaging of the chest was performed using the standard protocol during bolus administration of intravenous contrast. Multiplanar CT image reconstructions and MIPs were obtained to evaluate the vascular anatomy. Multidetector CT imaging of the abdomen and pelvis was performed using the standard protocol during bolus administration of intravenous contrast. RADIATION DOSE REDUCTION: This exam was performed according to the departmental dose-optimization program which includes automated exposure control, adjustment of the mA and/or kV according to patient size and/or use of iterative reconstruction technique. CONTRAST:  75mL OMNIPAQUE IOHEXOL 350 MG/ML SOLN COMPARISON:  07/20/2023 chest CT.  08/03/2023 abdominal CT FINDINGS: CTA CHEST FINDINGS Cardiovascular: Satisfactory opacification of the pulmonary arteries to the segmental level. No evidence of pulmonary embolism. Normal heart size. No pericardial effusion. Mediastinum/Nodes: Granulomatous type calcification within left mediastinal lymph node. No mass or worrisome node. Lungs/Pleura: Low volume chest with bands of atelectasis in the lower lungs.  Calcified granuloma in the left upper lobe. There is no edema, consolidation, effusion, or pneumothorax. 4 mm  pulmonary nodule in the left lower lobe that is stable from 2022 and considered benign. Musculoskeletal: No acute or aggressive finding Review of the MIP images confirms the above findings. CT ABDOMEN and PELVIS FINDINGS Hepatobiliary: No significant liver finding.Cholelithiasis. Pancreas: Unremarkable. Spleen: Unremarkable. Adrenals/Urinary Tract: Negative adrenals. No hydronephrosis or stone. Unremarkable bladder. Stomach/Bowel: Ileostomy. Distal small bowel loops show wall thickening and submucosal edema and is associated with roughly tubular rim enhancing collection which insinuates throughout the right abdomen, spanning the pyloric region and right subhepatic space to the level of the right lower quadrant where adjacent to the sigmoid colon. This abscess measures up to 2 cm in thickness and roughly follows the course of previously seen drain, including extending through the right rectus abdominus into the subcutaneous space. There is some degree of slow transit through the edematous small bowel loops with fecalized contents and mild small bowel distension above the affected segments. Pelvic fluid collections have resolved. Vascular/Lymphatic: No acute vascular abnormality. Expected thickening of mesenteric lymph nodes. Reproductive:No pathologic findings. Other: No ascites or pneumoperitoneum. Musculoskeletal: No acute abnormalities. Review of the MIP images confirms the above findings. IMPRESSION: Insinuating abscess in the right abdomen spanning the subhepatic space to iliac fossa, up to 2 cm in thickness and largely following the path of the surgical drain seen on prior, including extending through the right rectus abdominus into the subcutaneous space. Enteritis of adjacent small bowel loops with slow transit. Low volume chest with atelectasis.  Negative for pulmonary embolism. Cholelithiasis.  Electronically Signed   By: Tiburcio Pea M.D.   On: 09/06/2023 06:31   DG Chest Port 1 View  Result Date: 09/06/2023 CLINICAL DATA:  Possible sepsis. EXAM: PORTABLE CHEST 1 VIEW COMPARISON:  07/19/2023. FINDINGS: The heart size and mediastinal contours are within normal limits. Lung volumes are low and there is mild elevation of the right diaphragm. Strandy atelectasis is noted at the lung bases. No effusion or pneumothorax. No acute osseous abnormality. IMPRESSION: Low lung volumes with strandy atelectasis at the lung bases. Electronically Signed   By: Thornell Sartorius M.D.   On: 09/06/2023 04:56    Pending Labs Unresulted Labs (From admission, onward)     Start     Ordered   09/13/23 0500  Creatinine, serum  (enoxaparin (LOVENOX)    CrCl >/= 30 ml/min)  Weekly,   R     Comments: while on enoxaparin therapy    09/06/23 0845   09/07/23 0500  CBC  Tomorrow morning,   R        09/06/23 0845   09/07/23 0500  Basic metabolic panel  Tomorrow morning,   R        09/06/23 0845   09/06/23 0846  C Difficile Quick Screen w PCR reflex  (C Difficile quick screen w PCR reflex panel )  Once, for 24 hours,   TIMED       References:    CDiff Information Tool   09/06/23 0845   09/06/23 0846  Gastrointestinal Panel by PCR , Stool  (Gastrointestinal Panel by PCR, Stool                                                                                                                                                     **  Does Not include CLOSTRIDIUM DIFFICILE testing. **If CDIFF testing is needed, place order from the "C Difficile Testing" order set.**)  Once,   R        09/06/23 0845   09/06/23 0843  Sodium  ONCE - STAT,   STAT        09/06/23 0845   09/06/23 0423  Blood Culture (routine x 2)  (Septic presentation on arrival (screening labs, nursing and treatment orders for obvious sepsis))  BLOOD CULTURE X 2,   STAT      09/06/23 0439            Vitals/Pain Today's Vitals   09/06/23 0651 09/06/23 0700  09/06/23 0730 09/06/23 0800  BP:  112/73 112/69 115/71  Pulse:  97 93 87  Resp:      Temp:      SpO2:  97% 97% 98%  Weight:      Height:      PainSc: 6        Isolation Precautions Enteric precautions (UV disinfection)  Medications Medications  vancomycin (VANCOREADY) IVPB 1500 mg/300 mL (1,500 mg Intravenous New Bag/Given 09/06/23 0703)  enoxaparin (LOVENOX) injection 40 mg (has no administration in time range)  0.9 %  sodium chloride infusion (has no administration in time range)  hydrALAZINE (APRESOLINE) injection 10 mg (has no administration in time range)  pantoprazole (PROTONIX) injection 40 mg (has no administration in time range)  simethicone (MYLICON) chewable tablet 40 mg (has no administration in time range)  ondansetron (ZOFRAN-ODT) disintegrating tablet 4 mg (has no administration in time range)    Or  ondansetron (ZOFRAN) injection 4 mg (has no administration in time range)  prochlorperazine (COMPAZINE) tablet 10 mg (has no administration in time range)    Or  prochlorperazine (COMPAZINE) injection 5-10 mg (has no administration in time range)  polyethylene glycol (MIRALAX / GLYCOLAX) packet 17 g (has no administration in time range)  diphenhydrAMINE (BENADRYL) capsule 25 mg (has no administration in time range)    Or  diphenhydrAMINE (BENADRYL) injection 25 mg (has no administration in time range)  methocarbamol (ROBAXIN) tablet 500 mg (has no administration in time range)    Or  methocarbamol (ROBAXIN) 500 mg in dextrose 5 % 50 mL IVPB (has no administration in time range)  oxyCODONE (Oxy IR/ROXICODONE) immediate release tablet 5-10 mg (has no administration in time range)  HYDROmorphone (DILAUDID) injection 0.5 mg (has no administration in time range)  acetaminophen (TYLENOL) tablet 650 mg (has no administration in time range)    Or  acetaminophen (TYLENOL) suppository 650 mg (has no administration in time range)  piperacillin-tazobactam (ZOSYN) IVPB 3.375 g  (has no administration in time range)  escitalopram (LEXAPRO) tablet 10 mg (has no administration in time range)  sodium bicarbonate tablet 650 mg (has no administration in time range)  tamsulosin (FLOMAX) capsule 0.4 mg (has no administration in time range)  lactated ringers bolus 1,000 mL (1,000 mLs Intravenous New Bag/Given 09/06/23 0651)  lactated ringers bolus 1,000 mL (0 mLs Intravenous Stopped 09/06/23 0650)  ceFEPIme (MAXIPIME) 2 g in sodium chloride 0.9 % 100 mL IVPB (0 g Intravenous Stopped 09/06/23 0555)  metroNIDAZOLE (FLAGYL) IVPB 500 mg (0 mg Intravenous Stopped 09/06/23 0659)  HYDROmorphone (DILAUDID) injection 0.5 mg (0.5 mg Intravenous Given 09/06/23 0503)  iohexol (OMNIPAQUE) 350 MG/ML injection 75 mL (75 mLs Intravenous Contrast Given 09/06/23 0529)    Mobility walks with person assist     Focused Assessments -RLQ colostomy -Right sided abdominal pain, accompanied by  nausea.   -decreased appetite.  -Voids in urinal.  R Recommendations: See Admitting Provider Note  Report given to:   Additional Notes:

## 2023-09-06 NOTE — ED Provider Notes (Signed)
[place] [facility] DEPARTMENT AT [facility] Provider Note   CSN: [id number] Arrival date & time: 09/06/23  0309     History  Chief Complaint  Patient presents with   Abdominal Pain   Nausea    Carl Briggs is a 52 y.o. 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  He reports that he vomits when he eats solid foods.  He was evaluated by general surgery on 09/03/23 who reported wound and previous drain site are well healed with viable ostomy.  He reports that he feels short of breath at rest.  He denies cough, known sick exposure, and chest pain.  He reports 1 episode of fever this week but did not take his temperature.  A Spanish interpreter was available and utilized by provider and patient to obtain HPI information and during physical exam   Abdominal Pain Associated symptoms: nausea   Associated symptoms: no chest pain, no chills, no cough, no dysuria, no fever, no hematuria, no shortness of breath, no sore throat and no vomiting        Home Medications Prior to Admission medications   Medication Sig Start Date End Date Taking? Authorizing Provider  aspirin EC 81 MG tablet Take 1 tablet (81 mg total) by mouth daily. Swallow whole. 07/24/23   Pokhrel, Rebekah Chesterfield, MD  dicyclomine (BENTYL) 20 MG tablet Take 1 tablet (20 mg total) by mouth 4 (four) times daily -  before meals and at bedtime for 5 days. 07/23/23 07/28/23  Pokhrel, Rebekah Chesterfield, MD  docusate sodium (COLACE) 100 MG capsule Take 1 capsule (100 mg total) by mouth 2 (two) times daily. Patient taking differently: Take 100 mg by mouth 2 (two) times daily as needed (constipation). 07/23/23   Pokhrel, Rebekah Chesterfield, MD  escitalopram (LEXAPRO) 10 MG tablet Take 1 tablet (10 mg total) by mouth daily. 07/23/23 10/21/23  Pokhrel, Rebekah Chesterfield, MD   folic acid (FOLVITE) 1 MG tablet Take 1 tablet (1 mg total) by mouth daily. 07/23/23 10/21/23  Pokhrel, Rebekah Chesterfield, MD  ibuprofen (ADVIL) 200 MG tablet Take 400 mg by mouth every 6 (six) hours as needed for headache or moderate pain.    [provider]  methocarbamol (ROBAXIN) 500 MG tablet Take 1-2 tablets (500-1,000 mg total) by mouth every 6 (six) hours as needed for muscle spasms. 08/08/23   Meuth, Lina Sar, PA-C  Oxycodone HCl 10 MG TABS Take 1-1.5 tablets (10-15 mg total) by mouth every 6 (six) hours as needed for moderate pain or severe pain (10mg  moderate, 15mg  severe). 08/08/23   Meuth, Brooke A, PA-C  pantoprazole (PROTONIX) 40 MG tablet Take 1 tablet (40 mg total) by mouth daily. 07/23/23 10/21/23  Pokhrel, Rebekah Chesterfield, MD  sodium bicarbonate 650 MG tablet Take 1 tablet (650 mg total) by mouth 2 (two) times daily. 08/08/23   Meuth, Lina Sar, PA-C  Sodium Bicarbonate-Citric Acid (ALKA-SELTZER PO) Take 2 tablets by mouth 2 (two) times daily as needed (stomach upset).    [provider]  tamsulosin (FLOMAX) 0.4 MG CAPS capsule Take 1 capsule (0.4 mg total) by mouth daily. 07/23/23 10/21/23  Pokhrel, Rebekah Chesterfield, MD  TYLENOL 500 MG tablet Take 500-1,000 mg by mouth every 6 (six) hours as needed for mild pain or headache.    [provider]      Allergies    Patient has no known  allergies.    Review of Systems   Review of Systems  Constitutional:  Negative for chills and fever.  HENT:  Negative for ear pain and sore throat.   Eyes:  Negative for pain and visual disturbance.  Respiratory:  Negative for cough and shortness of breath.   Cardiovascular:  Negative for chest pain and palpitations.  Gastrointestinal:  Positive for abdominal pain and nausea. Negative for vomiting.  Genitourinary:  Positive for decreased urine volume. Negative for dysuria and hematuria.  Musculoskeletal:  Negative for arthralgias and back pain.  Skin:  Negative for color change and rash.  Neurological:   Negative for seizures and syncope.  All other systems reviewed and are negative.   Physical Exam Updated Vital Signs BP 107/86 (BP Location: Right Arm)   Pulse (!) 134   Temp 98.1 F (36.7 C)   Resp 20   Ht 5' 0.63" (1.54 m)   Wt 62.6 kg   SpO2 95%   BMI 26.40 kg/m  Physical Exam Vitals and nursing note reviewed.  Constitutional:      General: He is not in acute distress.    Appearance: Normal appearance. He is well-developed.  HENT:     Head: Normocephalic and atraumatic.  Eyes:     General: No scleral icterus.    Conjunctiva/sclera: Conjunctivae normal.  Cardiovascular:     Rate and Rhythm: Tachycardia present.     Pulses: Normal pulses.  Pulmonary:     Effort: Pulmonary effort is normal. No respiratory distress.     Breath sounds: Normal breath sounds.  Abdominal:     General: There is no distension.     Tenderness: There is abdominal tenderness. There is no rebound.     Comments: Tenderness to right quadrant of abdomen underneath previous incision scar Open midline wound from colectomy appears to be without infection and healing well  Musculoskeletal:        General: No swelling. Normal range of motion.     Cervical back: Neck supple.  Skin:    General: Skin is warm and dry.     Capillary Refill: Capillary refill takes less than 2 seconds.     Coloration: Skin is not jaundiced or pale.  Neurological:     Mental Status: He is alert. Mental status is at baseline.  Psychiatric:        Mood and Affect: Mood normal.     ED Results / Procedures / Treatments   Labs (all labs ordered are listed, but only abnormal results are displayed) Labs Reviewed  COMPREHENSIVE METABOLIC PANEL - Abnormal; Notable for the following components:      Result Value   Sodium 123 (*)    Chloride 94 (*)    CO2 16 (*)    Glucose, Bld 130 (*)    Calcium 8.5 (*)    Albumin 2.5 (*)    AST 48 (*)    ALT 63 (*)    Alkaline Phosphatase 149 (*)    All other components within normal  limits  CBC - Abnormal; Notable for the following components:   WBC 23.3 (*)    RBC 3.90 (*)    Hemoglobin 10.9 (*)    HCT 33.1 (*)    Platelets 450 (*)    All other components within normal limits  URINALYSIS, ROUTINE W REFLEX MICROSCOPIC - Abnormal; Notable for the following components:   APPearance CLOUDY (*)    Hgb urine dipstick TRACE (*)    Bilirubin Urine SMALL (*)  Protein, ur 30 (*)    All other components within normal limits  RESP PANEL BY RT-PCR (RSV, FLU A&B, COVID)  RVPGX2  CULTURE, BLOOD (ROUTINE X 2)  CULTURE, BLOOD (ROUTINE X 2)  LIPASE, BLOOD  URINALYSIS, MICROSCOPIC (REFLEX)  PROTIME-INR  APTT  URINALYSIS, W/ REFLEX TO CULTURE (INFECTION SUSPECTED)  I-STAT CG4 LACTIC ACID, ED    EKG EKG Interpretation Date/Time:  Saturday September 06 2023 03:21:44 EDT Ventricular Rate:  131 PR Interval:  142 QRS Duration:  70 QT Interval:  286 QTC Calculation: 422 R Axis:   13  Text Interpretation: Sinus tachycardia Otherwise normal ECG When compared with ECG of 20-Jul-2023 19:01, PREVIOUS ECG IS PRESENT Rate faster Confirmed by Glynn Octave [phone]) on 09/06/2023 4:20:37 AM  Radiology DG Chest Port 1 View  Result Date: 09/06/2023 CLINICAL DATA:  Possible sepsis. EXAM: PORTABLE CHEST 1 VIEW COMPARISON:  07/19/2023. FINDINGS: The heart size and mediastinal contours are within normal limits. Lung volumes are low and there is mild elevation of the right diaphragm. Strandy atelectasis is noted at the lung bases. No effusion or pneumothorax. No acute osseous abnormality. IMPRESSION: Low lung volumes with strandy atelectasis at the lung bases. Electronically Signed   By: Thornell Sartorius M.D.   On: 09/06/2023 04:56    Procedures .Critical Care  Performed by: Judithann Sheen, PA Authorized by: Judithann Sheen, PA   Critical care provider statement:    Critical care time (minutes):  35   Critical care was necessary to treat or prevent imminent or life-threatening  deterioration of the following conditions:  Sepsis   Critical care was time spent personally by me on the following activities:  Development of treatment plan with patient or surrogate, discussions with consultants, evaluation of patient's response to treatment, examination of patient, ordering and review of laboratory studies, ordering and review of radiographic studies, ordering and performing treatments and interventions, pulse oximetry, re-evaluation of patient's condition and review of old charts   I assumed direction of critical care for this patient from another provider in my specialty: yes     Care discussed with: admitting provider       Medications Ordered in ED Medications  lactated ringers bolus 1,000 mL (has no administration in time range)  lactated ringers bolus 1,000 mL (1,000 mLs Intravenous New Bag/Given 09/06/23 0505)  ceFEPIme (MAXIPIME) 2 g in sodium chloride 0.9 % 100 mL IVPB (2 g Intravenous New Bag/Given 09/06/23 0509)  metroNIDAZOLE (FLAGYL) IVPB 500 mg (has no administration in time range)  vancomycin (VANCOREADY) IVPB 1500 mg/300 mL (has no administration in time range)  HYDROmorphone (DILAUDID) injection 0.5 mg (0.5 mg Intravenous Given 09/06/23 0503)    ED Course/ Medical Decision Making/ A&P                                 Medical Decision Making Amount and/or Complexity of Data Reviewed Labs: ordered. Radiology: ordered. ECG/medicine tests: ordered.  Risk Prescription drug management.   Upon assessment, patient is resting comfortably in bed. He is tachycardic initially at 134. No hypoxia nor tachypnea noted. He reports dyspnea at rest but is able to speak in full and complete sentences. Due to age, tachycardia, and recent surgery within last 4 weeks, will order CT PA to r/o PE. Tenderness to palpation and rigidity to right abdomen under previous incision. No skin changes, erythema to incision sites and they appear to be healing well. 0.5mg  Dilaudid  given  for pain.    Due to unexplained tachycardia and patient reporting an episode of fever, will proceed with sepsis workup.  Leukocytosis and hyponatremia noted upon lab work.  Lipase negative. COVID pending. Lactic WNL. UA negative for infection. Will provide LR bolus for suspected sepsis and hyponatremia.  Flagyl, vanc, and cefepime initiated for suspected infection of unknown source. Blood cultures pending. CT abdomen pelvis with contrast significant for insinuating abscess in the right abdomen spanning the subhepatic space to iliac fossa, up to 2 cm in thickness and largely following the path of the surgical drain seen on prior including extending through the right rectus abdominus into the subcutaneous space  Chest x-ray significant for low lung volumes with streaky atelectasis at the lung bases and mild elevation of right diaphragm likely contributing to dyspnea.  CT negative for PE and effusion.   Plan to consult general surgery for new abscess and admission for sepsis management.  Sign out to [facility].        Final Clinical Impression(s) / ED Diagnoses Final diagnoses:  None    Rx / DC Orders ED Discharge Orders     None         Judithann Sheen, PA 09/06/23 1610    Glynn Octave, MD 09/06/23 2103

## 2023-09-06 NOTE — H&P (Addendum)
[facility] Admission Note  Carl Briggs [facility] 12/23/1971  [id number].    Requesting MD: Fayrene Helper PA Chief Complaint/Reason for Consult: abscess  HPI:  Carl Briggs is a 52 y.o. male S/P emergent Right colectomy, drainage of retroperitoneal abscess, ileostomy 07/25/2023 by Dr. Janee Morn for perforated right colon. He was seen in our office 3 days ago and doing well at that point. Two days ago he developed worsening right sided abdominal pain, nausea, weakness, SOB, and diarrhea. No emesis. He had a single fever 4 days ago that resolved. States that he has not been able to eat/drink much at home over the last few days. Abdominal drain removed about 3 weeks ago. He completed his oral antibiotics last week.  CT today shows insinuating abscess in the right abdomen spanning the subhepatic space to iliac fossa, up to 2 cm in thickness and largely following the path of the surgical drain seen on prior, including extending through the right rectus abdominus into the subcutaneous space; enteritis of adjacent small bowel loops with slow transit. CTA negative for PE.   Family History  Problem Relation Age of Onset   Diabetes Mother     Past Medical History:  Diagnosis Date   Anxiety    High cholesterol    Hypertension     Past Surgical History:  Procedure Laterality Date   COLECTOMY N/A 07/25/2023   Procedure: OPEN  RIGHT COLECTOMY;  Surgeon: Violeta Gelinas, MD;  Location: [facility] OR;  Service: General;  Laterality: N/A;   COLONOSCOPY WITH PROPOFOL N/A 11/09/2018   Procedure: COLONOSCOPY WITH PROPOFOL;  Surgeon: Willis Modena, MD;  Location: [facility] ENDOSCOPY;  Service: Endoscopy;  Laterality: N/A;   LEFT HEART CATH AND CORONARY ANGIOGRAPHY N/A 07/23/2023   Procedure: LEFT HEART CATH AND CORONARY ANGIOGRAPHY;  Surgeon: Kathleene Hazel, MD;  Location: [facility] INVASIVE CV LAB;  Service: Cardiovascular;  Laterality: N/A;    Social History:  reports that he has never smoked.  He has never used smokeless tobacco. He reports that he does not currently use alcohol. No history on file for drug use.  Allergies: No Known Allergies  (Not in a hospital admission)   Prior to Admission medications   Medication Sig Start Date End Date Taking? Authorizing Provider  aspirin EC 81 MG tablet Take 1 tablet (81 mg total) by mouth daily. Swallow whole. 07/24/23   Pokhrel, Rebekah Chesterfield, MD  dicyclomine (BENTYL) 20 MG tablet Take 1 tablet (20 mg total) by mouth 4 (four) times daily -  before meals and at bedtime for 5 days. 07/23/23 07/28/23  Pokhrel, Rebekah Chesterfield, MD  docusate sodium (COLACE) 100 MG capsule Take 1 capsule (100 mg total) by mouth 2 (two) times daily. Patient taking differently: Take 100 mg by mouth 2 (two) times daily as needed (constipation). 07/23/23   Pokhrel, Rebekah Chesterfield, MD  escitalopram (LEXAPRO) 10 MG tablet Take 1 tablet (10 mg total) by mouth daily. 07/23/23 10/21/23  Pokhrel, Rebekah Chesterfield, MD  folic acid (FOLVITE) 1 MG tablet Take 1 tablet (1 mg total) by mouth daily. 07/23/23 10/21/23  Pokhrel, Rebekah Chesterfield, MD  ibuprofen (ADVIL) 200 MG tablet Take 400 mg by mouth every 6 (six) hours as needed for headache or moderate pain.    [provider]  methocarbamol (ROBAXIN) 500 MG tablet Take 1-2 tablets (500-1,000 mg total) by mouth every 6 (six) hours as needed for muscle spasms. 08/08/23   Maiya Kates A, PA-C  Oxycodone HCl 10 MG TABS Take 1-1.5 tablets (10-15 mg total) by mouth  every 6 (six) hours as needed for moderate pain or severe pain (10mg  moderate, 15mg  severe). 08/08/23   Versie Soave A, PA-C  pantoprazole (PROTONIX) 40 MG tablet Take 1 tablet (40 mg total) by mouth daily. 07/23/23 10/21/23  Pokhrel, Rebekah Chesterfield, MD  sodium bicarbonate 650 MG tablet Take 1 tablet (650 mg total) by mouth 2 (two) times daily. 08/08/23   Koston Hennes, Lina Sar, PA-C  Sodium Bicarbonate-Citric Acid (ALKA-SELTZER PO) Take 2 tablets by mouth 2 (two) times daily as needed (stomach upset).    [provider]   tamsulosin (FLOMAX) 0.4 MG CAPS capsule Take 1 capsule (0.4 mg total) by mouth daily. 07/23/23 10/21/23  Pokhrel, Rebekah Chesterfield, MD  TYLENOL 500 MG tablet Take 500-1,000 mg by mouth every 6 (six) hours as needed for mild pain or headache.    [provider]    Blood pressure 115/71, pulse 87, temperature 98.1 F (36.7 C), resp. rate 20, height 5' 0.63" (1.54 m), weight 62.6 kg, SpO2 98%. Physical Exam: General: pleasant, WD/WN male who is laying in bed in NAD HEENT: head is normocephalic, atraumatic.  Sclera are noninjected.  Pupils equal and round.  Ears and nose without any masses or lesions.  Mouth is pink and moist. Dentition fair Heart: regular, rate, and rhythm  Lungs: CTAB, no wheezes, rhonchi, or rales noted.  Respiratory effort nonlabored on room air MS: no BUE/BLE edema, calves soft and nontender Skin: warm and dry with no masses, lesions, or rashes Psych: A&Ox4 with an appropriate affect Neuro: MAEs, no gross motor or sensory deficits BUE/BLE Abd: soft, ND, open midline wound pictured below with healthy granulation tissue, ostomy viable with thin light brown/yellow stool in bag, right sided drain site closed with some induration and significant tenderness/ no drainage   Results for orders placed or performed during the hospital encounter of 09/06/23 (from the past 48 hour(s))  Lipase, blood     Status: None   Collection Time: 09/06/23  3:23 AM  Result Value Ref Range   Lipase 31 11 - 51 U/L    Comment: Performed at [facility], [address] [place], [place] [address]  Comprehensive metabolic panel     Status: Abnormal   Collection Time: 09/06/23  3:23 AM  Result Value Ref Range   Sodium 123 (L) 135 - 145 mmol/L   Potassium 4.1 3.5 - 5.1 mmol/L   Chloride 94 (L) 98 - 111 mmol/L   CO2 16 (L) 22 - 32 mmol/L   Glucose, Bld 130 (H) 70 - 99 mg/dL    Comment: Glucose reference range applies only to samples taken after fasting for at least 8 hours.   BUN 10 6 - 20  mg/dL   Creatinine, Ser 8.65 0.61 - 1.24 mg/dL   Calcium 8.5 (L) 8.9 - 10.3 mg/dL   Total Protein 7.9 6.5 - 8.1 g/dL   Albumin 2.5 (L) 3.5 - 5.0 g/dL   AST 48 (H) 15 - 41 U/L   ALT 63 (H) 0 - 44 U/L   Alkaline Phosphatase 149 (H) 38 - 126 U/L   Total Bilirubin 1.1 0.3 - 1.2 mg/dL   GFR, Estimated >78 >46 mL/min    Comment: (NOTE) Calculated using the CKD-EPI Creatinine Equation (2021)    Anion gap 13 5 - 15    Comment: Performed at [facility], [address] [place], [place] [address]  CBC     Status: Abnormal   Collection Time: 09/06/23  3:23 AM  Result  Value Ref Range   WBC 23.3 (H) 4.0 - 10.5 K/uL   RBC 3.90 (L) 4.22 - 5.81 MIL/uL   Hemoglobin 10.9 (L) 13.0 - 17.0 g/dL   HCT 09.6 (L) 04.5 - 40.9 %   MCV 84.9 80.0 - 100.0 fL   MCH 27.9 26.0 - 34.0 pg   MCHC 32.9 30.0 - 36.0 g/dL   RDW 81.1 91.4 - 78.2 %   Platelets 450 (H) 150 - 400 K/uL   nRBC 0.0 0.0 - 0.2 %    Comment: Performed at [facility], [address] [place], [place] [address]  Urinalysis, Routine w reflex microscopic -Urine, Clean Catch     Status: Abnormal   Collection Time: 09/06/23  4:09 AM  Result Value Ref Range   Color, Urine YELLOW YELLOW   APPearance CLOUDY (A) CLEAR   Specific Gravity, Urine 1.020 1.005 - 1.030   pH 6.0 5.0 - 8.0   Glucose, UA NEGATIVE NEGATIVE mg/dL   Hgb urine dipstick TRACE (A) NEGATIVE   Bilirubin Urine SMALL (A) NEGATIVE   Ketones, ur NEGATIVE NEGATIVE mg/dL   Protein, ur 30 (A) NEGATIVE mg/dL   Nitrite NEGATIVE NEGATIVE   Leukocytes,Ua NEGATIVE NEGATIVE    Comment: Performed at [facility], [address] [place], [place] [address]  Urinalysis, Microscopic (reflex)     Status: None   Collection Time: 09/06/23  4:09 AM  Result Value Ref Range   RBC / HPF 0-5 0 - 5 RBC/hpf   WBC, UA 0-5 0 - 5 WBC/hpf   Bacteria, UA NONE SEEN NONE SEEN   Squamous Epithelial / HPF 0-5 0 - 5 /HPF   Mucus PRESENT     Comment: Performed at [facility],  [address] [place], [place] [address]  I-Stat Lactic Acid, ED     Status: None   Collection Time: 09/06/23  5:16 AM  Result Value Ref Range   Lactic Acid, Venous 1.1 0.5 - 1.9 mmol/L  Protime-INR     Status: None   Collection Time: 09/06/23  5:16 AM  Result Value Ref Range   Prothrombin Time 15.1 11.4 - 15.2 seconds   INR 1.2 0.8 - 1.2    Comment: (NOTE) INR goal varies based on device and disease states. Performed at [facility], [address] [place], [place] [address]   APTT     Status: None   Collection Time: 09/06/23  5:16 AM  Result Value Ref Range   aPTT 34 24 - 36 seconds    Comment: Performed at [facility], [address] [place], [place] [address]  Resp panel by RT-PCR (RSV, Flu A&B, Covid) Anterior Nasal Swab     Status: None   Collection Time: 09/06/23  5:20 AM   Specimen: Anterior Nasal Swab  Result Value Ref Range   SARS Coronavirus 2 by RT PCR NEGATIVE NEGATIVE   Influenza A by PCR NEGATIVE NEGATIVE   Influenza B by PCR NEGATIVE NEGATIVE    Comment: (NOTE) The Xpert Xpress SARS-CoV-2/FLU/RSV plus assay is intended as an aid in the diagnosis of influenza from Nasopharyngeal swab specimens and should not be used as a sole basis for treatment. Nasal washings and aspirates are unacceptable for Xpert Xpress SARS-CoV-2/FLU/RSV testing.  Fact Sheet for Patients: BloggerCourse.com  Fact Sheet for Healthcare Providers: SeriousBroker.it  This test is not yet approved or cleared by the [place] FDA and has been authorized for detection and/or diagnosis of  SARS-CoV-2 by FDA under an Emergency Use Authorization (EUA). This EUA will remain in effect (meaning this test can be used) for the duration of the COVID-19 declaration under Section 564(b)(1) of the Act, 21 U.S.C. section 360bbb-3(b)(1), unless the authorization is terminated or revoked.     Resp Syncytial Virus by PCR NEGATIVE NEGATIVE     Comment: (NOTE) Fact Sheet for Patients: BloggerCourse.com  Fact Sheet for Healthcare Providers: SeriousBroker.it  This test is not yet approved or cleared by the [place] FDA and has been authorized for detection and/or diagnosis of SARS-CoV-2 by FDA under an Emergency Use Authorization (EUA). This EUA will remain in effect (meaning this test can be used) for the duration of the COVID-19 declaration under Section 564(b)(1) of the Act, 21 U.S.C. section 360bbb-3(b)(1), unless the authorization is terminated or revoked.  Performed at [facility], [address], [place], [place] [address]   Urinalysis, w/ Reflex to Culture (Infection Suspected) -Urine, Clean Catch     Status: Abnormal   Collection Time: 09/06/23  7:03 AM  Result Value Ref Range   Specimen Source URINE, CLEAN CATCH    Color, Urine YELLOW YELLOW   APPearance CLEAR CLEAR   Specific Gravity, Urine <1.005 (L) 1.005 - 1.030   pH 6.0 5.0 - 8.0   Glucose, UA NEGATIVE NEGATIVE mg/dL   Hgb urine dipstick TRACE (A) NEGATIVE   Bilirubin Urine NEGATIVE NEGATIVE   Ketones, ur NEGATIVE NEGATIVE mg/dL   Protein, ur NEGATIVE NEGATIVE mg/dL   Nitrite NEGATIVE NEGATIVE   Leukocytes,Ua NEGATIVE NEGATIVE   Squamous Epithelial / HPF 0-5 0 - 5 /HPF   WBC, UA 0-5 0 - 5 WBC/hpf    Comment: Reflex urine culture not performed if WBC <=10, OR if Squamous epithelial cells >5. If Squamous epithelial cells >5, suggest recollection.   RBC / HPF 0-5 0 - 5 RBC/hpf   Bacteria, UA NONE SEEN NONE SEEN    Comment: Performed at [facility], [address], [place], [place] [address]   CT Angio Chest PE W and/or Wo Contrast  Result Date: 09/06/2023 CLINICAL DATA:  Chronic dyspnea with chest wall with pleural disease suspected EXAM: CT ANGIOGRAPHY CHEST CT ABDOMEN AND PELVIS WITH CONTRAST TECHNIQUE: Multidetector CT imaging of the chest was performed using the standard protocol  during bolus administration of intravenous contrast. Multiplanar CT image reconstructions and MIPs were obtained to evaluate the vascular anatomy. Multidetector CT imaging of the abdomen and pelvis was performed using the standard protocol during bolus administration of intravenous contrast. RADIATION DOSE REDUCTION: This exam was performed according to the departmental dose-optimization program which includes automated exposure control, adjustment of the mA and/or kV according to patient size and/or use of iterative reconstruction technique. CONTRAST:  75mL OMNIPAQUE IOHEXOL 350 MG/ML SOLN COMPARISON:  07/20/2023 chest CT.  08/03/2023 abdominal CT FINDINGS: CTA CHEST FINDINGS Cardiovascular: Satisfactory opacification of the pulmonary arteries to the segmental level. No evidence of pulmonary embolism. Normal heart size. No pericardial effusion. Mediastinum/Nodes: Granulomatous type calcification within left mediastinal lymph node. No mass or worrisome node. Lungs/Pleura: Low volume chest with bands of atelectasis in the lower lungs. Calcified granuloma in the left upper lobe. There is no edema, consolidation, effusion, or pneumothorax. 4 mm pulmonary nodule in the left lower lobe that is stable from 2022 and considered benign. Musculoskeletal: No acute or aggressive finding Review of the MIP images confirms the above findings. CT ABDOMEN and PELVIS FINDINGS Hepatobiliary: No significant liver finding.Cholelithiasis. Pancreas: Unremarkable. Spleen:  Unremarkable. Adrenals/Urinary Tract: Negative adrenals. No hydronephrosis or stone. Unremarkable bladder. Stomach/Bowel: Ileostomy. Distal small bowel loops show wall thickening and submucosal edema and is associated with roughly tubular rim enhancing collection which insinuates throughout the right abdomen, spanning the pyloric region and right subhepatic space to the level of the right lower quadrant where adjacent to the sigmoid colon. This abscess measures up to 2 cm  in thickness and roughly follows the course of previously seen drain, including extending through the right rectus abdominus into the subcutaneous space. There is some degree of slow transit through the edematous small bowel loops with fecalized contents and mild small bowel distension above the affected segments. Pelvic fluid collections have resolved. Vascular/Lymphatic: No acute vascular abnormality. Expected thickening of mesenteric lymph nodes. Reproductive:No pathologic findings. Other: No ascites or pneumoperitoneum. Musculoskeletal: No acute abnormalities. Review of the MIP images confirms the above findings. IMPRESSION: Insinuating abscess in the right abdomen spanning the subhepatic space to iliac fossa, up to 2 cm in thickness and largely following the path of the surgical drain seen on prior, including extending through the right rectus abdominus into the subcutaneous space. Enteritis of adjacent small bowel loops with slow transit. Low volume chest with atelectasis.  Negative for pulmonary embolism. Cholelithiasis. Electronically Signed   By: Tiburcio Pea M.D.   On: 09/06/2023 06:31   CT ABDOMEN PELVIS W CONTRAST  Result Date: 09/06/2023 CLINICAL DATA:  Chronic dyspnea with chest wall with pleural disease suspected EXAM: CT ANGIOGRAPHY CHEST CT ABDOMEN AND PELVIS WITH CONTRAST TECHNIQUE: Multidetector CT imaging of the chest was performed using the standard protocol during bolus administration of intravenous contrast. Multiplanar CT image reconstructions and MIPs were obtained to evaluate the vascular anatomy. Multidetector CT imaging of the abdomen and pelvis was performed using the standard protocol during bolus administration of intravenous contrast. RADIATION DOSE REDUCTION: This exam was performed according to the departmental dose-optimization program which includes automated exposure control, adjustment of the mA and/or kV according to patient size and/or use of iterative reconstruction  technique. CONTRAST:  75mL OMNIPAQUE IOHEXOL 350 MG/ML SOLN COMPARISON:  07/20/2023 chest CT.  08/03/2023 abdominal CT FINDINGS: CTA CHEST FINDINGS Cardiovascular: Satisfactory opacification of the pulmonary arteries to the segmental level. No evidence of pulmonary embolism. Normal heart size. No pericardial effusion. Mediastinum/Nodes: Granulomatous type calcification within left mediastinal lymph node. No mass or worrisome node. Lungs/Pleura: Low volume chest with bands of atelectasis in the lower lungs. Calcified granuloma in the left upper lobe. There is no edema, consolidation, effusion, or pneumothorax. 4 mm pulmonary nodule in the left lower lobe that is stable from 2022 and considered benign. Musculoskeletal: No acute or aggressive finding Review of the MIP images confirms the above findings. CT ABDOMEN and PELVIS FINDINGS Hepatobiliary: No significant liver finding.Cholelithiasis. Pancreas: Unremarkable. Spleen: Unremarkable. Adrenals/Urinary Tract: Negative adrenals. No hydronephrosis or stone. Unremarkable bladder. Stomach/Bowel: Ileostomy. Distal small bowel loops show wall thickening and submucosal edema and is associated with roughly tubular rim enhancing collection which insinuates throughout the right abdomen, spanning the pyloric region and right subhepatic space to the level of the right lower quadrant where adjacent to the sigmoid colon. This abscess measures up to 2 cm in thickness and roughly follows the course of previously seen drain, including extending through the right rectus abdominus into the subcutaneous space. There is some degree of slow transit through the edematous small bowel loops with fecalized contents and mild small bowel distension above the affected segments. Pelvic fluid collections have resolved. Vascular/Lymphatic: No acute  vascular abnormality. Expected thickening of mesenteric lymph nodes. Reproductive:No pathologic findings. Other: No ascites or pneumoperitoneum.  Musculoskeletal: No acute abnormalities. Review of the MIP images confirms the above findings. IMPRESSION: Insinuating abscess in the right abdomen spanning the subhepatic space to iliac fossa, up to 2 cm in thickness and largely following the path of the surgical drain seen on prior, including extending through the right rectus abdominus into the subcutaneous space. Enteritis of adjacent small bowel loops with slow transit. Low volume chest with atelectasis.  Negative for pulmonary embolism. Cholelithiasis. Electronically Signed   By: Tiburcio Pea M.D.   On: 09/06/2023 06:31   DG Chest Port 1 View  Result Date: 09/06/2023 CLINICAL DATA:  Possible sepsis. EXAM: PORTABLE CHEST 1 VIEW COMPARISON:  07/19/2023. FINDINGS: The heart size and mediastinal contours are within normal limits. Lung volumes are low and there is mild elevation of the right diaphragm. Strandy atelectasis is noted at the lung bases. No effusion or pneumothorax. No acute osseous abnormality. IMPRESSION: Low lung volumes with strandy atelectasis at the lung bases. Electronically Signed   By: Thornell Sartorius M.D.   On: 09/06/2023 04:56      Assessment/Plan Right sided abdominal abscess 6 weeks s/p RIGHT COLECTOMY, DRAINAGE RETROPERITONEAL ABSCESS, 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 Janee Morn for perforated colitis with RTP abscess - CT today shows insinuating abscess in the right abdomen spanning the subhepatic space to iliac fossa, up to 2 cm in thickness and largely following the path of the surgical drain seen on prior, including extending through the right rectus abdominus into the subcutaneous space; enteritis of adjacent small bowel loops with slow transit  - Abscess is small, no role for drainage at this time. Will admit and start on IV antibiotics. Given his loose stool and h/o antibiotic use will check for c diff.    ID - [id number]  FEN - IVF, Regular diet VTE - SCDs, lovenox Foley - none   Hyponatremia - chronic, on sodium bicarb tabs at home  - reordered. Na 123, s/p 2L boluses. Recheck Na Hx HTN Recent admit for elevated troponin, felt likely demand ischemia - left heart cath negative for CAD    Franne Forts, [facility] Surgery 09/06/2023, 8:34 AM Please see Amion for pager number during day hours 7:00am-4:30pm

## 2023-09-06 NOTE — ED Triage Notes (Addendum)
Patient reports persistent pain across abdomen with nausea and poor appetite this week , S/P Ileostomy surgery last 08/20/23. Tachycardic at triage .

## 2023-09-06 NOTE — Sepsis Progress Note (Signed)
Elink monitoring for the code sepsis protocol.  

## 2023-09-06 NOTE — Plan of Care (Signed)

## 2023-09-06 NOTE — Progress Notes (Signed)
Upon patient assessment, noticed bulging, ~1inch round abscess at old JP drain site.  At 2000, pt alerted staff that the abscess had opened, purelent, brown, foul smelling and copious drainage assessed to be flowing from the site, not slowing or stopping after 10 minutes.  Dr. Sheliah Hatch made aware, no new orders at this time besides to dress the site and maintain dressing until assessed by CCS in AM.  Site dressed with gauze, abd pads and medipore tape.  Will assess/change Q2 until decreased exudate evident, vitals q4.  Will continue to monitor.

## 2023-09-06 NOTE — ED Provider Notes (Signed)
Received signout from previous provider, please see her note for complete H&P.  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 Janee Morn on 07/25/2023.  Patient subsequently was discharged on 08/08/2023.  He presenting to the ED today with complaints of having abdominal pain, poor appetite along with some shortness of breath and subjective fever which has been an ongoing issue for the past 3 days.  Workup is remarkable for of an abscess in the right abdomen largely following the path of the surgical drain that was seen on prior and it does extend to the right rectus and the subcutaneous space.  Patient also has evidence of enteritis with an adjacent small bowel loops with slow transit.  No evidence of any PE.  Plan to consult general surgery for their involvement.  Other notable lab includes hyponatremia with a sodium of 123.  IV fluid was given.  Mild transaminitis with AST 48 ALT 63 and alk phos of 149.  Patient also has a mildly elevated white count of 23.3.  Code sepsis was initially initiated and patient was provided with broad-spectrum antibiotic  6:49 AM Appreciate consultation from general surgeon Dr. Sheliah Hatch who agrees to see pt in the ER and will likely admit patient for further management of his condition.  .Critical Care  Performed by: Fayrene Helper, PA-C Authorized by: Fayrene Helper, PA-C   Critical care provider statement:    Critical care time (minutes):  35   Critical care was time spent personally by me on the following activities:  Development of treatment plan with patient or surrogate, discussions with consultants, evaluation of patient's response to treatment, examination of patient, ordering and review of laboratory studies, ordering and review of radiographic  studies, ordering and performing treatments and interventions, pulse oximetry, re-evaluation of patient's condition and review of old charts    BP 112/76   Pulse 95   Temp 98.1 F (36.7 C)   Resp 20   Ht 5' 0.63" (1.54 m)   Wt 62.6 kg   SpO2 96%   BMI 26.40 kg/m   Results for orders placed or performed during the hospital encounter of 09/06/23  Resp panel by RT-PCR (RSV, Flu A&B, Covid) Anterior Nasal Swab   Specimen: Anterior Nasal Swab  Result Value Ref Range   SARS Coronavirus 2 by RT PCR NEGATIVE NEGATIVE   Influenza A by PCR NEGATIVE NEGATIVE   Influenza B by PCR NEGATIVE NEGATIVE   Resp Syncytial Virus by PCR NEGATIVE NEGATIVE  Lipase, blood  Result Value Ref Range   Lipase 31 11 - 51 U/L  Comprehensive metabolic panel  Result Value Ref Range   Sodium 123 (L) 135 - 145 mmol/L   Potassium 4.1 3.5 - 5.1 mmol/L   Chloride 94 (L) 98 - 111 mmol/L   CO2 16 (L) 22 - 32 mmol/L   Glucose, Bld 130 (H) 70 - 99 mg/dL   BUN 10 6 - 20 mg/dL   Creatinine, Ser 6.29 0.61 - 1.24 mg/dL   Calcium 8.5 (L) 8.9 - 10.3 mg/dL   Total Protein 7.9 6.5 - 8.1 g/dL   Albumin 2.5 (L) 3.5 - 5.0 g/dL   AST 48 (H) 15 - 41 U/L   ALT 63 (H) 0 - 44 U/L   Alkaline Phosphatase 149 (H) 38 - 126 U/L  Total Bilirubin 1.1 0.3 - 1.2 mg/dL   GFR, Estimated >16 >10 mL/min   Anion gap 13 5 - 15  CBC  Result Value Ref Range   WBC 23.3 (H) 4.0 - 10.5 K/uL   RBC 3.90 (L) 4.22 - 5.81 MIL/uL   Hemoglobin 10.9 (L) 13.0 - 17.0 g/dL   HCT 96.0 (L) 45.4 - 09.8 %   MCV 84.9 80.0 - 100.0 fL   MCH 27.9 26.0 - 34.0 pg   MCHC 32.9 30.0 - 36.0 g/dL   RDW 11.9 14.7 - 82.9 %   Platelets 450 (H) 150 - 400 K/uL   nRBC 0.0 0.0 - 0.2 %  Urinalysis, Routine w reflex microscopic -Urine, Clean Catch  Result Value Ref Range   Color, Urine YELLOW YELLOW   APPearance CLOUDY (A) CLEAR   Specific Gravity, Urine 1.020 1.005 - 1.030   pH 6.0 5.0 - 8.0   Glucose, UA NEGATIVE NEGATIVE mg/dL   Hgb urine dipstick TRACE (A)  NEGATIVE   Bilirubin Urine SMALL (A) NEGATIVE   Ketones, ur NEGATIVE NEGATIVE mg/dL   Protein, ur 30 (A) NEGATIVE mg/dL   Nitrite NEGATIVE NEGATIVE   Leukocytes,Ua NEGATIVE NEGATIVE  Urinalysis, Microscopic (reflex)  Result Value Ref Range   RBC / HPF 0-5 0 - 5 RBC/hpf   WBC, UA 0-5 0 - 5 WBC/hpf   Bacteria, UA NONE SEEN NONE SEEN   Squamous Epithelial / HPF 0-5 0 - 5 /HPF   Mucus PRESENT   Protime-INR  Result Value Ref Range   Prothrombin Time 15.1 11.4 - 15.2 seconds   INR 1.2 0.8 - 1.2  APTT  Result Value Ref Range   aPTT 34 24 - 36 seconds  I-Stat Lactic Acid, ED  Result Value Ref Range   Lactic Acid, Venous 1.1 0.5 - 1.9 mmol/L   CT Angio Chest PE W and/or Wo Contrast  Result Date: 09/06/2023 CLINICAL DATA:  Chronic dyspnea with chest wall with pleural disease suspected EXAM: CT ANGIOGRAPHY CHEST CT ABDOMEN AND PELVIS WITH CONTRAST TECHNIQUE: Multidetector CT imaging of the chest was performed using the standard protocol during bolus administration of intravenous contrast. Multiplanar CT image reconstructions and MIPs were obtained to evaluate the vascular anatomy. Multidetector CT imaging of the abdomen and pelvis was performed using the standard protocol during bolus administration of intravenous contrast. RADIATION DOSE REDUCTION: This exam was performed according to the departmental dose-optimization program which includes automated exposure control, adjustment of the mA and/or kV according to patient size and/or use of iterative reconstruction technique. CONTRAST:  75mL OMNIPAQUE IOHEXOL 350 MG/ML SOLN COMPARISON:  07/20/2023 chest CT.  08/03/2023 abdominal CT FINDINGS: CTA CHEST FINDINGS Cardiovascular: Satisfactory opacification of the pulmonary arteries to the segmental level. No evidence of pulmonary embolism. Normal heart size. No pericardial effusion. Mediastinum/Nodes: Granulomatous type calcification within left mediastinal lymph node. No mass or worrisome node.  Lungs/Pleura: Low volume chest with bands of atelectasis in the lower lungs. Calcified granuloma in the left upper lobe. There is no edema, consolidation, effusion, or pneumothorax. 4 mm pulmonary nodule in the left lower lobe that is stable from 2022 and considered benign. Musculoskeletal: No acute or aggressive finding Review of the MIP images confirms the above findings. CT ABDOMEN and PELVIS FINDINGS Hepatobiliary: No significant liver finding.Cholelithiasis. Pancreas: Unremarkable. Spleen: Unremarkable. Adrenals/Urinary Tract: Negative adrenals. No hydronephrosis or stone. Unremarkable bladder. Stomach/Bowel: Ileostomy. Distal small bowel loops show wall thickening and submucosal edema and is associated with roughly tubular rim enhancing collection which insinuates  throughout the right abdomen, spanning the pyloric region and right subhepatic space to the level of the right lower quadrant where adjacent to the sigmoid colon. This abscess measures up to 2 cm in thickness and roughly follows the course of previously seen drain, including extending through the right rectus abdominus into the subcutaneous space. There is some degree of slow transit through the edematous small bowel loops with fecalized contents and mild small bowel distension above the affected segments. Pelvic fluid collections have resolved. Vascular/Lymphatic: No acute vascular abnormality. Expected thickening of mesenteric lymph nodes. Reproductive:No pathologic findings. Other: No ascites or pneumoperitoneum. Musculoskeletal: No acute abnormalities. Review of the MIP images confirms the above findings. IMPRESSION: Insinuating abscess in the right abdomen spanning the subhepatic space to iliac fossa, up to 2 cm in thickness and largely following the path of the surgical drain seen on prior, including extending through the right rectus abdominus into the subcutaneous space. Enteritis of adjacent small bowel loops with slow transit. Low volume  chest with atelectasis.  Negative for pulmonary embolism. Cholelithiasis. Electronically Signed   By: Tiburcio Pea M.D.   On: 09/06/2023 06:31   CT ABDOMEN PELVIS W CONTRAST  Result Date: 09/06/2023 CLINICAL DATA:  Chronic dyspnea with chest wall with pleural disease suspected EXAM: CT ANGIOGRAPHY CHEST CT ABDOMEN AND PELVIS WITH CONTRAST TECHNIQUE: Multidetector CT imaging of the chest was performed using the standard protocol during bolus administration of intravenous contrast. Multiplanar CT image reconstructions and MIPs were obtained to evaluate the vascular anatomy. Multidetector CT imaging of the abdomen and pelvis was performed using the standard protocol during bolus administration of intravenous contrast. RADIATION DOSE REDUCTION: This exam was performed according to the departmental dose-optimization program which includes automated exposure control, adjustment of the mA and/or kV according to patient size and/or use of iterative reconstruction technique. CONTRAST:  75mL OMNIPAQUE IOHEXOL 350 MG/ML SOLN COMPARISON:  07/20/2023 chest CT.  08/03/2023 abdominal CT FINDINGS: CTA CHEST FINDINGS Cardiovascular: Satisfactory opacification of the pulmonary arteries to the segmental level. No evidence of pulmonary embolism. Normal heart size. No pericardial effusion. Mediastinum/Nodes: Granulomatous type calcification within left mediastinal lymph node. No mass or worrisome node. Lungs/Pleura: Low volume chest with bands of atelectasis in the lower lungs. Calcified granuloma in the left upper lobe. There is no edema, consolidation, effusion, or pneumothorax. 4 mm pulmonary nodule in the left lower lobe that is stable from 2022 and considered benign. Musculoskeletal: No acute or aggressive finding Review of the MIP images confirms the above findings. CT ABDOMEN and PELVIS FINDINGS Hepatobiliary: No significant liver finding.Cholelithiasis. Pancreas: Unremarkable. Spleen: Unremarkable. Adrenals/Urinary Tract:  Negative adrenals. No hydronephrosis or stone. Unremarkable bladder. Stomach/Bowel: Ileostomy. Distal small bowel loops show wall thickening and submucosal edema and is associated with roughly tubular rim enhancing collection which insinuates throughout the right abdomen, spanning the pyloric region and right subhepatic space to the level of the right lower quadrant where adjacent to the sigmoid colon. This abscess measures up to 2 cm in thickness and roughly follows the course of previously seen drain, including extending through the right rectus abdominus into the subcutaneous space. There is some degree of slow transit through the edematous small bowel loops with fecalized contents and mild small bowel distension above the affected segments. Pelvic fluid collections have resolved. Vascular/Lymphatic: No acute vascular abnormality. Expected thickening of mesenteric lymph nodes. Reproductive:No pathologic findings. Other: No ascites or pneumoperitoneum. Musculoskeletal: No acute abnormalities. Review of the MIP images confirms the above findings. IMPRESSION: Insinuating abscess in the  right abdomen spanning the subhepatic space to iliac fossa, up to 2 cm in thickness and largely following the path of the surgical drain seen on prior, including extending through the right rectus abdominus into the subcutaneous space. Enteritis of adjacent small bowel loops with slow transit. Low volume chest with atelectasis.  Negative for pulmonary embolism. Cholelithiasis. Electronically Signed   By: Tiburcio Pea M.D.   On: 09/06/2023 06:31   DG Chest Port 1 View  Result Date: 09/06/2023 CLINICAL DATA:  Possible sepsis. EXAM: PORTABLE CHEST 1 VIEW COMPARISON:  07/19/2023. FINDINGS: The heart size and mediastinal contours are within normal limits. Lung volumes are low and there is mild elevation of the right diaphragm. Strandy atelectasis is noted at the lung bases. No effusion or pneumothorax. No acute osseous abnormality.  IMPRESSION: Low lung volumes with strandy atelectasis at the lung bases. Electronically Signed   By: Thornell Sartorius M.D.   On: 09/06/2023 04:56      Fayrene Helper, PA-C 09/06/23 1610    Arby Barrette, MD 09/06/23 1626

## 2023-09-07 LAB — GASTROINTESTINAL PANEL BY PCR, STOOL (REPLACES STOOL CULTURE)

## 2023-09-07 LAB — BASIC METABOLIC PANEL
Anion gap: 10 (ref 5–15)
BUN: 7 mg/dL (ref 6–20)
CO2: 16 mmol/L — ABNORMAL LOW (ref 22–32)
Calcium: 7.6 mg/dL — ABNORMAL LOW (ref 8.9–10.3)
Chloride: 100 mmol/L (ref 98–111)
Creatinine, Ser: 0.6 mg/dL — ABNORMAL LOW (ref 0.61–1.24)
GFR, Estimated: >60 mL/min (ref >60)
Glucose, Bld: 107 mg/dL — ABNORMAL HIGH (ref 70–99)
Potassium: 3.5 mmol/L (ref 3.5–5.1)
Sodium: 126 mmol/L — ABNORMAL LOW (ref 135–145)

## 2023-09-07 LAB — CBC
HCT: 27.7 % — ABNORMAL LOW (ref 39.0–52.0)
Hemoglobin: 8.9 g/dL — ABNORMAL LOW (ref 13.0–17.0)
MCH: 28 pg (ref 26.0–34.0)
MCHC: 32.1 g/dL (ref 30.0–36.0)
MCV: 87.1 fL (ref 80.0–100.0)
Platelets: 374 10*3/uL (ref 150–400)
RBC: 3.18 MIL/uL — ABNORMAL LOW (ref 4.22–5.81)
RDW: 14.2 % (ref 11.5–15.5)
WBC: 17.4 10*3/uL — ABNORMAL HIGH (ref 4.0–10.5)
nRBC: 0 % (ref 0.0–0.2)

## 2023-09-07 MED ORDER — OXYCODONE HCL 5 MG PO TABS
5.0000 mg | ORAL_TABLET | ORAL | Status: DC | PRN
  Administered 2023-09-07 – 2023-09-10 (×9): 10 mg via ORAL
  Filled 2023-09-07 (×9): qty 2

## 2023-09-07 NOTE — Plan of Care (Signed)
  Problem: Education: Goal: Understanding of CV disease, CV risk reduction, and recovery process will improve 09/07/2023 1036 by Annalee Genta, RN Outcome: Progressing 09/07/2023 1036 by Annalee Genta, RN Outcome: Progressing Goal: Individualized Educational Video(s) 09/07/2023 1036 by Annalee Genta, RN Outcome: Progressing 09/07/2023 1036 by Annalee Genta, RN Outcome: Progressing   Problem: Activity: Goal: Ability to return to baseline activity level will improve 09/07/2023 1036 by Annalee Genta, RN Outcome: Progressing 09/07/2023 1036 by Annalee Genta, RN Outcome: Progressing   Problem: Cardiovascular: Goal: Ability to achieve and maintain adequate cardiovascular perfusion will improve 09/07/2023 1036 by Annalee Genta, RN Outcome: Progressing 09/07/2023 1036 by Annalee Genta, RN Outcome: Progressing Goal: Vascular access site(s) Level 0-1 will be maintained 09/07/2023 1036 by Annalee Genta, RN Outcome: Progressing 09/07/2023 1036 by Annalee Genta, RN Outcome: Progressing   Problem: Health Behavior/Discharge Planning: Goal: Ability to safely manage health-related needs after discharge will improve 09/07/2023 1036 by Annalee Genta, RN Outcome: Progressing 09/07/2023 1036 by Annalee Genta, RN Outcome: Progressing   Problem: Education: Goal: Knowledge of General Education information will improve Description: Including pain rating scale, medication(s)/side effects and non-pharmacologic comfort measures 09/07/2023 1036 by Annalee Genta, RN Outcome: Progressing 09/07/2023 1036 by Annalee Genta, RN Outcome: Progressing   Problem: Health Behavior/Discharge Planning: Goal: Ability to manage health-related needs will improve 09/07/2023 1036 by Annalee Genta, RN Outcome: Progressing 09/07/2023 1036 by Annalee Genta, RN Outcome: Progressing   Problem: Clinical Measurements: Goal: Ability to maintain clinical measurements within normal limits  will improve 09/07/2023 1036 by Annalee Genta, RN Outcome: Progressing 09/07/2023 1036 by Annalee Genta, RN Outcome: Progressing Goal: Will remain free from infection 09/07/2023 1036 by Annalee Genta, RN Outcome: Progressing 09/07/2023 1036 by Annalee Genta, RN Outcome: Progressing Goal: Diagnostic test results will improve 09/07/2023 1036 by Annalee Genta, RN Outcome: Progressing 09/07/2023 1036 by Annalee Genta, RN Outcome: Progressing Goal: Respiratory complications will improve 09/07/2023 1036 by Annalee Genta, RN Outcome: Progressing 09/07/2023 1036 by Annalee Genta, RN Outcome: Progressing Goal: Cardiovascular complication will be avoided 09/07/2023 1036 by Annalee Genta, RN Outcome: Progressing 09/07/2023 1036 by Annalee Genta, RN Outcome: Progressing   Problem: Activity: Goal: Risk for activity intolerance will decrease 09/07/2023 1036 by Annalee Genta, RN Outcome: Progressing 09/07/2023 1036 by Annalee Genta, RN Outcome: Progressing   Problem: Nutrition: Goal: Adequate nutrition will be maintained 09/07/2023 1036 by Annalee Genta, RN Outcome: Progressing 09/07/2023 1036 by Annalee Genta, RN Outcome: Progressing   Problem: Coping: Goal: Level of anxiety will decrease 09/07/2023 1036 by Annalee Genta, RN Outcome: Progressing 09/07/2023 1036 by Annalee Genta, RN Outcome: Progressing   Problem: Elimination: Goal: Will not experience complications related to bowel motility 09/07/2023 1036 by Annalee Genta, RN Outcome: Progressing 09/07/2023 1036 by Annalee Genta, RN Outcome: Progressing Goal: Will not experience complications related to urinary retention 09/07/2023 1036 by Annalee Genta, RN Outcome: Progressing 09/07/2023 1036 by Annalee Genta, RN Outcome: Progressing   Problem: Pain Managment: Goal: General experience of comfort will improve 09/07/2023 1036 by Annalee Genta, RN Outcome: Progressing 09/07/2023  1036 by Annalee Genta, RN Outcome: Progressing   Problem: Safety: Goal: Ability to remain free from injury will improve 09/07/2023 1036 by Annalee Genta, RN Outcome: Progressing 09/07/2023 1036 by Annalee Genta, RN Outcome: Progressing   Problem: Skin Integrity: Goal: Risk for impaired skin integrity will decrease 09/07/2023 1036 by Annalee Genta, RN Outcome: Progressing 09/07/2023 1036 by Annalee Genta, RN Outcome: Progressing

## 2023-09-07 NOTE — Plan of Care (Signed)
Problem: Education: Goal: Knowledge of General Education information will improve Description Including pain rating scale, medication(s)/side effects and non-pharmacologic comfort measures Outcome: Progressing   Problem: Health Behavior/Discharge Planning: Goal: Ability to manage health-related needs will improve Outcome: Progressing   Problem: Clinical Measurements: Goal: Ability to maintain clinical measurements within normal limits will improve Outcome: Progressing Goal: Respiratory complications will improve Outcome: Progressing   Problem: Nutrition: Goal: Adequate nutrition will be maintained Outcome: Progressing

## 2023-09-07 NOTE — Progress Notes (Signed)
Patient ID: Maro Ridgel, male   DOB: 1971-07-08, 52 y.o.   MRN: [id number] [facility] Surgery Progress Note     Subjective: CC-  Abscess started draining from old drain site last night. Abdomen still sore in this location but pain a little better after drainage began. Nausea resolved. Tolerating diet. Ostomy productive.  Objective: Vital signs in last 24 hours: Temp:  [97.6 F (36.4 C)-99.1 F (37.3 C)] 98.4 F (36.9 C) (09/15 0745) Pulse Rate:  [84-109] 84 (09/15 0745) Resp:  [16-20] 16 (09/15 0745) BP: (91-122)/(66-97) 102/66 (09/15 0745) SpO2:  [97 %-100 %] 98 % (09/15 0745)    Intake/Output from previous day: 09/14 0701 - 09/15 0700 In: 1780 [P.O.:480; IV Piggyback:1300] Out: 1800 [Urine:1100; Stool:700] Intake/Output this shift: No intake/output data recorded.  PE: Gen:  Alert, NAD, pleasant Abd: soft, ND, open midline wound with healthy granulation tissue, ostomy viable with semisolid light light brown stool in bag. Old drain site in the right abdomen with small opening and thin purulent drainage/ mild cellulitis/ focally tender in this location  Lab Results:  Recent Labs    09/06/23 0323 09/07/23 0239  WBC 23.3* 17.4*  HGB 10.9* 8.9*  HCT 33.1* 27.7*  PLT 450* 374   BMET Recent Labs    09/06/23 0323 09/06/23 1006 09/07/23 0239  NA 123* 127* 126*  K 4.1  --  3.5  CL 94*  --  100  CO2 16*  --  16*  GLUCOSE 130*  --  107*  BUN 10  --  7  CREATININE 0.70  --  0.60*  CALCIUM 8.5*  --  7.6*   PT/INR Recent Labs    09/06/23 0516  LABPROT 15.1  INR 1.2   CMP     Component Value Date/Time   NA 126 (L) 09/07/2023 0239   NA 141 12/14/2018 1545   K 3.5 09/07/2023 0239   CL 100 09/07/2023 0239   CO2 16 (L) 09/07/2023 0239   GLUCOSE 107 (H) 09/07/2023 0239   BUN 7 09/07/2023 0239   BUN 14 12/14/2018 1545   CREATININE 0.60 (L) 09/07/2023 0239   CALCIUM 7.6 (L) 09/07/2023 0239   PROT 7.9 09/06/2023 0323   PROT 7.3 12/14/2018 1545    ALBUMIN 2.5 (L) 09/06/2023 0323   ALBUMIN 4.1 12/14/2018 1545   AST 48 (H) 09/06/2023 0323   ALT 63 (H) 09/06/2023 0323   ALKPHOS 149 (H) 09/06/2023 0323   BILITOT 1.1 09/06/2023 0323   BILITOT 0.4 12/14/2018 1545   GFRNONAA >60 09/07/2023 0239   GFRAA 118 12/14/2018 1545   Lipase     Component Value Date/Time   LIPASE 31 09/06/2023 0323       Studies/Results: CT Angio Chest PE W and/or Wo Contrast  Result Date: 09/06/2023 CLINICAL DATA:  Chronic dyspnea with chest wall with pleural disease suspected EXAM: CT ANGIOGRAPHY CHEST CT ABDOMEN AND PELVIS WITH CONTRAST TECHNIQUE: Multidetector CT imaging of the chest was performed using the standard protocol during bolus administration of intravenous contrast. Multiplanar CT image reconstructions and MIPs were obtained to evaluate the vascular anatomy. Multidetector CT imaging of the abdomen and pelvis was performed using the standard protocol during bolus administration of intravenous contrast. RADIATION DOSE REDUCTION: This exam was performed according to the departmental dose-optimization program which includes automated exposure control, adjustment of the mA and/or kV according to patient size and/or use of iterative reconstruction technique. CONTRAST:  75mL OMNIPAQUE IOHEXOL 350 MG/ML SOLN COMPARISON:  07/20/2023 chest CT.  08/03/2023  abdominal CT FINDINGS: CTA CHEST FINDINGS Cardiovascular: Satisfactory opacification of the pulmonary arteries to the segmental level. No evidence of pulmonary embolism. Normal heart size. No pericardial effusion. Mediastinum/Nodes: Granulomatous type calcification within left mediastinal lymph node. No mass or worrisome node. Lungs/Pleura: Low volume chest with bands of atelectasis in the lower lungs. Calcified granuloma in the left upper lobe. There is no edema, consolidation, effusion, or pneumothorax. 4 mm pulmonary nodule in the left lower lobe that is stable from 2022 and considered benign. Musculoskeletal: No  acute or aggressive finding Review of the MIP images confirms the above findings. CT ABDOMEN and PELVIS FINDINGS Hepatobiliary: No significant liver finding.Cholelithiasis. Pancreas: Unremarkable. Spleen: Unremarkable. Adrenals/Urinary Tract: Negative adrenals. No hydronephrosis or stone. Unremarkable bladder. Stomach/Bowel: Ileostomy. Distal small bowel loops show wall thickening and submucosal edema and is associated with roughly tubular rim enhancing collection which insinuates throughout the right abdomen, spanning the pyloric region and right subhepatic space to the level of the right lower quadrant where adjacent to the sigmoid colon. This abscess measures up to 2 cm in thickness and roughly follows the course of previously seen drain, including extending through the right rectus abdominus into the subcutaneous space. There is some degree of slow transit through the edematous small bowel loops with fecalized contents and mild small bowel distension above the affected segments. Pelvic fluid collections have resolved. Vascular/Lymphatic: No acute vascular abnormality. Expected thickening of mesenteric lymph nodes. Reproductive:No pathologic findings. Other: No ascites or pneumoperitoneum. Musculoskeletal: No acute abnormalities. Review of the MIP images confirms the above findings. IMPRESSION: Insinuating abscess in the right abdomen spanning the subhepatic space to iliac fossa, up to 2 cm in thickness and largely following the path of the surgical drain seen on prior, including extending through the right rectus abdominus into the subcutaneous space. Enteritis of adjacent small bowel loops with slow transit. Low volume chest with atelectasis.  Negative for pulmonary embolism. Cholelithiasis. Electronically Signed   By: Tiburcio Pea M.D.   On: 09/06/2023 06:31   CT ABDOMEN PELVIS W CONTRAST  Result Date: 09/06/2023 CLINICAL DATA:  Chronic dyspnea with chest wall with pleural disease suspected EXAM: CT  ANGIOGRAPHY CHEST CT ABDOMEN AND PELVIS WITH CONTRAST TECHNIQUE: Multidetector CT imaging of the chest was performed using the standard protocol during bolus administration of intravenous contrast. Multiplanar CT image reconstructions and MIPs were obtained to evaluate the vascular anatomy. Multidetector CT imaging of the abdomen and pelvis was performed using the standard protocol during bolus administration of intravenous contrast. RADIATION DOSE REDUCTION: This exam was performed according to the departmental dose-optimization program which includes automated exposure control, adjustment of the mA and/or kV according to patient size and/or use of iterative reconstruction technique. CONTRAST:  75mL OMNIPAQUE IOHEXOL 350 MG/ML SOLN COMPARISON:  07/20/2023 chest CT.  08/03/2023 abdominal CT FINDINGS: CTA CHEST FINDINGS Cardiovascular: Satisfactory opacification of the pulmonary arteries to the segmental level. No evidence of pulmonary embolism. Normal heart size. No pericardial effusion. Mediastinum/Nodes: Granulomatous type calcification within left mediastinal lymph node. No mass or worrisome node. Lungs/Pleura: Low volume chest with bands of atelectasis in the lower lungs. Calcified granuloma in the left upper lobe. There is no edema, consolidation, effusion, or pneumothorax. 4 mm pulmonary nodule in the left lower lobe that is stable from 2022 and considered benign. Musculoskeletal: No acute or aggressive finding Review of the MIP images confirms the above findings. CT ABDOMEN and PELVIS FINDINGS Hepatobiliary: No significant liver finding.Cholelithiasis. Pancreas: Unremarkable. Spleen: Unremarkable. Adrenals/Urinary Tract: Negative adrenals. No hydronephrosis or  stone. Unremarkable bladder. Stomach/Bowel: Ileostomy. Distal small bowel loops show wall thickening and submucosal edema and is associated with roughly tubular rim enhancing collection which insinuates throughout the right abdomen, spanning the pyloric  region and right subhepatic space to the level of the right lower quadrant where adjacent to the sigmoid colon. This abscess measures up to 2 cm in thickness and roughly follows the course of previously seen drain, including extending through the right rectus abdominus into the subcutaneous space. There is some degree of slow transit through the edematous small bowel loops with fecalized contents and mild small bowel distension above the affected segments. Pelvic fluid collections have resolved. Vascular/Lymphatic: No acute vascular abnormality. Expected thickening of mesenteric lymph nodes. Reproductive:No pathologic findings. Other: No ascites or pneumoperitoneum. Musculoskeletal: No acute abnormalities. Review of the MIP images confirms the above findings. IMPRESSION: Insinuating abscess in the right abdomen spanning the subhepatic space to iliac fossa, up to 2 cm in thickness and largely following the path of the surgical drain seen on prior, including extending through the right rectus abdominus into the subcutaneous space. Enteritis of adjacent small bowel loops with slow transit. Low volume chest with atelectasis.  Negative for pulmonary embolism. Cholelithiasis. Electronically Signed   By: Tiburcio Pea M.D.   On: 09/06/2023 06:31   DG Chest Port 1 View  Result Date: 09/06/2023 CLINICAL DATA:  Possible sepsis. EXAM: PORTABLE CHEST 1 VIEW COMPARISON:  07/19/2023. FINDINGS: The heart size and mediastinal contours are within normal limits. Lung volumes are low and there is mild elevation of the right diaphragm. Strandy atelectasis is noted at the lung bases. No effusion or pneumothorax. No acute osseous abnormality. IMPRESSION: Low lung volumes with strandy atelectasis at the lung bases. Electronically Signed   By: Thornell Sartorius M.D.   On: 09/06/2023 04:56    Anti-infectives: Anti-infectives (From admission, onward)    Start     Dose/Rate Route Frequency Ordered Stop   09/06/23 0900   piperacillin-tazobactam (ZOSYN) IVPB 3.375 g        3.375 g 12.5 mL/hr over 240 Minutes Intravenous Every 8 hours 09/06/23 0845 09/13/23 0559   09/06/23 0445  ceFEPIme (MAXIPIME) 2 g in sodium chloride 0.9 % 100 mL IVPB        2 g 200 mL/hr over 30 Minutes Intravenous  Once 09/06/23 0439 09/06/23 0555   09/06/23 0445  metroNIDAZOLE (FLAGYL) IVPB 500 mg        500 mg 100 mL/hr over 60 Minutes Intravenous  Once 09/06/23 0439 09/06/23 0659   09/06/23 0445  vancomycin (VANCOCIN) IVPB 1000 mg/200 mL premix  Status:  Discontinued        1,000 mg 200 mL/hr over 60 Minutes Intravenous  Once 09/06/23 0439 09/06/23 0443   09/06/23 0445  vancomycin (VANCOREADY) IVPB 1500 mg/300 mL        1,500 mg 150 mL/hr over 120 Minutes Intravenous  Once 09/06/23 0444 09/06/23 0904        Assessment/Plan Right sided abdominal abscess 6 weeks s/p RIGHT COLECTOMY, DRAINAGE RETROPERITONEAL ABSCESS, 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 Janee Morn for perforated colitis with RTP abscess - Abscess spontaneously draining. Change dry dressing BID and PRN saturation. Continue IV antibiotics. - Daily wet to dry dressing change to midline abdominal wound with mepitel at the base   ID - Zosyn  FEN - decrease IVF, Regular diet VTE - SCDs, lovenox Foley - none   Hyponatremia - chronic, on sodium bicarb tabs at home - reordered. Na 126, stable Hx HTN Recent  admit for elevated troponin, felt likely demand ischemia - left heart cath negative for CAD     LOS: 1 day    Franne Forts, [facility] 09/07/2023, 9:30 AM Please see Amion for pager number during day hours 7:00am-4:30pm

## 2023-09-07 NOTE — Plan of Care (Signed)
Problem: Education: Goal: Understanding of CV disease, CV risk reduction, and recovery process will improve Outcome: Progressing Goal: Individualized Educational Video(s) Outcome: Progressing   Problem: Activity: Goal: Ability to return to baseline activity level will improve Outcome: Progressing   Problem: Cardiovascular: Goal: Ability to achieve and maintain adequate cardiovascular perfusion will improve Outcome: Progressing Goal: Vascular access site(s) Level 0-1 will be maintained Outcome: Progressing   Problem: Health Behavior/Discharge Planning: Goal: Ability to safely manage health-related needs after discharge will improve Outcome: Progressing   Problem: Education: Goal: Knowledge of General Education information will improve Description: Including pain rating scale, medication(s)/side effects and non-pharmacologic comfort measures Outcome: Progressing   Problem: Health Behavior/Discharge Planning: Goal: Ability to manage health-related needs will improve Outcome: Progressing   Problem: Clinical Measurements: Goal: Ability to maintain clinical measurements within normal limits will improve Outcome: Progressing Goal: Will remain free from infection Outcome: Progressing Goal: Diagnostic test results will improve Outcome: Progressing Goal: Respiratory complications will improve Outcome: Progressing Goal: Cardiovascular complication will be avoided Outcome: Progressing   Problem: Activity: Goal: Risk for activity intolerance will decrease Outcome: Progressing   Problem: Nutrition: Goal: Adequate nutrition will be maintained Outcome: Progressing   Problem: Coping: Goal: Level of anxiety will decrease Outcome: Progressing   Problem: Elimination: Goal: Will not experience complications related to bowel motility Outcome: Progressing Goal: Will not experience complications related to urinary retention Outcome: Progressing   Problem: Pain Managment: Goal:  General experience of comfort will improve Outcome: Progressing   Problem: Safety: Goal: Ability to remain free from injury will improve Outcome: Progressing   Problem: Skin Integrity: Goal: Risk for impaired skin integrity will decrease Outcome: Progressing

## 2023-09-08 ENCOUNTER — Ambulatory Visit (INDEPENDENT_AMBULATORY_CARE_PROVIDER_SITE_OTHER): Payer: Self-pay | Admitting: Primary Care

## 2023-09-08 LAB — CBC
HCT: 27.5 % — ABNORMAL LOW (ref 39.0–52.0)
Hemoglobin: 9.1 g/dL — ABNORMAL LOW (ref 13.0–17.0)
MCH: 28.8 pg (ref 26.0–34.0)
MCHC: 33.1 g/dL (ref 30.0–36.0)
MCV: 87 fL (ref 80.0–100.0)
Platelets: 401 10*3/uL — ABNORMAL HIGH (ref 150–400)
RBC: 3.16 MIL/uL — ABNORMAL LOW (ref 4.22–5.81)
RDW: 14.2 % (ref 11.5–15.5)
WBC: 6.9 10*3/uL (ref 4.0–10.5)
nRBC: 0 % (ref 0.0–0.2)

## 2023-09-08 LAB — BASIC METABOLIC PANEL
Anion gap: 7 (ref 5–15)
BUN: 6 mg/dL (ref 6–20)
CO2: 19 mmol/L — ABNORMAL LOW (ref 22–32)
Calcium: 7.7 mg/dL — ABNORMAL LOW (ref 8.9–10.3)
Chloride: 105 mmol/L (ref 98–111)
Creatinine, Ser: 0.55 mg/dL — ABNORMAL LOW (ref 0.61–1.24)
GFR, Estimated: >60 mL/min (ref >60)
Glucose, Bld: 116 mg/dL — ABNORMAL HIGH (ref 70–99)
Potassium: 3.3 mmol/L — ABNORMAL LOW (ref 3.5–5.1)
Sodium: 131 mmol/L — ABNORMAL LOW (ref 135–145)

## 2023-09-08 MED ORDER — NUTRISOURCE FIBER PO PACK
1.0000 | PACK | Freq: Two times a day (BID) | ORAL | Status: DC
  Administered 2023-09-08 – 2023-09-11 (×7): 1 via ORAL
  Filled 2023-09-08 (×8): qty 1

## 2023-09-08 MED ORDER — POTASSIUM CHLORIDE CRYS ER 20 MEQ PO TBCR
30.0000 meq | EXTENDED_RELEASE_TABLET | Freq: Two times a day (BID) | ORAL | Status: AC
  Administered 2023-09-08 (×2): 30 meq via ORAL
  Filled 2023-09-08 (×2): qty 1

## 2023-09-08 MED ORDER — PANTOPRAZOLE SODIUM 40 MG PO TBEC
40.0000 mg | DELAYED_RELEASE_TABLET | ORAL | Status: DC
  Administered 2023-09-08 – 2023-09-10 (×3): 40 mg via ORAL
  Filled 2023-09-08 (×3): qty 1

## 2023-09-08 NOTE — Consult Note (Addendum)
WOC Nurse ostomy consult note Consult requested for ostomy supply assistance.  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  He states he is independent with pouch emptying and changing when at home, but he needs assistance when in bed, when I asked questions in Spanish.  Current pouch is overfilled and about to burst; emptied 450 cc liquid brown stool.  Pt wears a belt and uses a barrier ring and one piece flexible pouch, cut to approx 1 1/4 inches.  He uses ostomy powder.  4 sets of supplies ordered to the room for patient's use. He states he can change the pouch tomorrow with assistance from the nurse.  Current pouch is intact with good seal and it was applied on Fri.  Pt denies need to change it today. Stoma is red and viable when visualized through the pouch. Orders provided for bedside nurses to empty when half full to avoid overfilling.  4 sets of supplies ordered to the room: Use Supplies: skin prep wipes, ostomy powder, [employer] [place] # 6, barrier ring, [facility] # [id number], ostomy pouch [employer] [place] # [phone] Please re-consult if further assistance is needed.  Thank-you,  Cammie Mcgee MSN, RN, CWOCN, [place], CNS [phone]

## 2023-09-08 NOTE — Plan of Care (Signed)
  Problem: Activity: Goal: Ability to return to baseline activity level will improve Outcome: Progressing   Problem: Education: Goal: Knowledge of General Education information will improve Description: Including pain rating scale, medication(s)/side effects and non-pharmacologic comfort measures Outcome: Progressing   Problem: Clinical Measurements: Goal: Will remain free from infection Outcome: Progressing Goal: Respiratory complications will improve Outcome: Progressing Goal: Cardiovascular complication will be avoided Outcome: Progressing

## 2023-09-08 NOTE — Progress Notes (Signed)
Patient ID: Carl Briggs, male   DOB: [birth year], 52 y.o.   MRN: [id number] [facility] Surgery Progress Note     Subjective: CC-  Abscess draining from old drain site last night. Tolerating diet. Ostomy productive.  Objective: Vital signs in last 24 hours: Temp:  [97.7 F (36.5 C)-98.6 F (37 C)] 97.7 F (36.5 C) (09/16 0704) Pulse Rate:  [73-82] 75 (09/16 0704) Resp:  [16-20] 16 (09/16 0704) BP: (97-101)/(63-70) 101/70 (09/16 0704) SpO2:  [97 %-99 %] 99 % (09/16 0704) Last BM Date : 09/07/23  Intake/Output from previous day: 09/15 0701 - 09/16 0700 In: 820 [P.O.:720; IV Piggyback:100] Out: 2450 [Urine:1400; Stool:1050] Intake/Output this shift: No intake/output data recorded.  PE: Gen:  Alert, NAD, pleasant Abd: soft, ND, open midline wound with healthy granulation tissue, ostomy viable with semisolid light light brown stool in bag. Old drain site in the right abdomen with small opening and thin purulent drainage/focally tender in this location  Lab Results:  Recent Labs    09/07/23 0239 09/08/23 0234  WBC 17.4* 6.9  HGB 8.9* 9.1*  HCT 27.7* 27.5*  PLT 374 401*   BMET Recent Labs    09/07/23 0239 09/08/23 0234  NA 126* 131*  K 3.5 3.3*  CL 100 105  CO2 16* 19*  GLUCOSE 107* 116*  BUN 7 6  CREATININE 0.60* 0.55*  CALCIUM 7.6* 7.7*   PT/INR Recent Labs    09/06/23 0516  LABPROT 15.1  INR 1.2   CMP     Component Value Date/Time   NA 131 (L) 09/08/2023 0234   NA 141 12/14/2018 1545   K 3.3 (L) 09/08/2023 0234   CL 105 09/08/2023 0234   CO2 19 (L) 09/08/2023 0234   GLUCOSE 116 (H) 09/08/2023 0234   BUN 6 09/08/2023 0234   BUN 14 12/14/2018 1545   CREATININE 0.55 (L) 09/08/2023 0234   CALCIUM 7.7 (L) 09/08/2023 0234   PROT 7.9 09/06/2023 0323   PROT 7.3 12/14/2018 1545   ALBUMIN 2.5 (L) 09/06/2023 0323   ALBUMIN 4.1 12/14/2018 1545   AST 48 (H) 09/06/2023 0323   ALT 63 (H) 09/06/2023 0323   ALKPHOS 149 (H) 09/06/2023 0323    BILITOT 1.1 09/06/2023 0323   BILITOT 0.4 12/14/2018 1545   GFRNONAA >60 09/08/2023 0234   GFRAA 118 12/14/2018 1545   Lipase     Component Value Date/Time   LIPASE 31 09/06/2023 0323       Studies/Results: No results found.  Anti-infectives: Anti-infectives (From admission, onward)    Start     Dose/Rate Route Frequency Ordered Stop   09/06/23 0900  piperacillin-tazobactam (ZOSYN) IVPB 3.375 g        3.375 g 12.5 mL/hr over 240 Minutes Intravenous Every 8 hours 09/06/23 0845 09/13/23 0559   09/06/23 0445  ceFEPIme (MAXIPIME) 2 g in sodium chloride 0.9 % 100 mL IVPB        2 g 200 mL/hr over 30 Minutes Intravenous  Once 09/06/23 0439 09/06/23 0555   09/06/23 0445  metroNIDAZOLE (FLAGYL) IVPB 500 mg        500 mg 100 mL/hr over 60 Minutes Intravenous  Once 09/06/23 0439 09/06/23 0659   09/06/23 0445  vancomycin (VANCOCIN) IVPB 1000 mg/200 mL premix  Status:  Discontinued        1,000 mg 200 mL/hr over 60 Minutes Intravenous  Once 09/06/23 0439 09/06/23 0443   09/06/23 0445  vancomycin (VANCOREADY) IVPB 1500 mg/300 mL  1,500 mg 150 mL/hr over 120 Minutes Intravenous  Once 09/06/23 0444 09/06/23 0904        Assessment/Plan Right sided abdominal abscess 6 weeks s/p RIGHT COLECTOMY, DRAINAGE RETROPERITONEAL ABSCESS, 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 Janee Morn for perforated colitis with RTP abscess - Abscess spontaneously draining. Change dry dressing BID and PRN saturation. Continue IV antibiotics. - Daily wet to dry dressing change to midline abdominal wound with mepitel at the base -cont IV abx therapy -tolerating solid diet and ileostomy working well   ID - Zosyn  FEN - decrease IVF, Regular diet VTE - SCDs, lovenox Foley - none   Hyponatremia - chronic, on sodium bicarb tabs at home - reordered. Na 131 today, stable Hx HTN Recent admit for elevated troponin, felt likely demand ischemia - left heart cath negative for CAD  Hypokalemia - replace today, BMET in am    LOS:  2 days    Letha Cape, [facility] Surgery 09/08/2023, 10:36 AM Please see Amion for pager number during day hours 7:00am-4:30pm

## 2023-09-08 NOTE — TOC Initial Note (Signed)
Transition of Care (TOC) - Initial/Assessment Note   Spoke to patient at bedside via [employer] interpreter Brayton Caves [phone]  Patient from home with wife.   Active with [facility] for HHRN. Brandi with [facility] will need new order.   Patient does not have insurance , has been buying ostomy supplies . Patent examiner . Patient was set up with [facility] Passe for PCP , he would like a different provider , however if not available he will continue to see Dr Randa Evens NCM will  call and place information on AVS.     Patient Details  Name: Carl Briggs MRN: [id number] Date of Birth: July 10, 1971  Transition of Care [facility]) CM/SW Contact:    Kingsley Plan, RN Phone Number: 09/08/2023, 3:02 PM  Clinical Narrative:                   Expected Discharge Plan: Home w Home Health Services Barriers to Discharge: Continued Medical Work up   Patient Goals and CMS Choice Patient states their goals for this hospitalization and ongoing recovery are:: to return to home CMS Medicare.gov Compare Post Acute Care list provided to:: Patient Choice offered to / list presented to : Patient [place] ownership interest in [facility].provided to:: Patient    Expected Discharge Plan and Services In-house Referral: Financial Counselor Discharge Planning Services: CM Consult Post Acute Care Choice: Home Health Living arrangements for the past 2 months: Single Family Home                   DME Agency: NA       HH Arranged: RN HH Agency: [facility] Date HH Agency Contacted: 09/08/23 Time HH Agency Contacted: 1457 Representative spoke with at [facility] Agency: Brandi  Prior Living Arrangements/Services Living arrangements for the past 2 months: Single Family Home Lives with:: Spouse Patient language and need for interpreter reviewed:: Yes Do you feel safe going back to the place where you live?: Yes      Need for Family Participation in Patient Care: Yes  (Comment) Care giver support system in place?: Yes (comment)   Criminal Activity/Legal Involvement Pertinent to Current Situation/Hospitalization: No - Comment as needed  Activities of Daily Living Home Assistive Devices/Equipment: None ADL Screening (condition at time of admission) Patient's cognitive ability adequate to safely complete daily activities?: Yes Is the patient deaf or have difficulty hearing?: No Does the patient have difficulty seeing, even when wearing glasses/contacts?: No Does the patient have difficulty concentrating, remembering, or making decisions?: No Patient able to express need for assistance with ADLs?: No Does the patient have difficulty dressing or bathing?: No Independently performs ADLs?: Yes (appropriate for developmental age) Does the patient have difficulty walking or climbing stairs?: No Weakness of Legs: None Weakness of Arms/Hands: None  Permission Sought/Granted   Permission granted to share information with : Yes, Verbal Permission Granted     Permission granted to share info w AGENCY: [facility]        Emotional Assessment Appearance:: Appears stated age Attitude/Demeanor/Rapport: Engaged Affect (typically observed): Accepting Orientation: : Oriented to Self, Oriented to Place, Oriented to  Time, Oriented to Situation Alcohol / Substance Use: Not Applicable Psych Involvement: No (comment)  Admission diagnosis:  Postoperative intra-abdominal abscess [T81.43XA] Enteritis [K52.9] Postoperative infection [T81.40XA] Patient Active Problem List   Diagnosis Date Noted   Postoperative infection 09/06/2023   Irritant contact dermatitis associated with fecal stoma 08/22/2023   Ileostomy care (HCC) 08/22/2023   Bowel perforation (HCC) 07/25/2023  Pneumoperitoneum 07/25/2023   Colitis 07/22/2023   Elevated troponin 07/20/2023   Chest pain 08/11/2021   SIRS (systemic inflammatory response syndrome) (HCC) 08/11/2021   Alcohol withdrawal  (HCC) 08/11/2021   Metabolic acidosis with increased anion gap and accumulation of organic acids 08/11/2021   Hyponatremia 08/11/2021   Hyperbilirubinemia 08/11/2021   Elevated AST (SGOT) 08/11/2021   Dyspnea 08/11/2021   Low back pain 10/03/2020   Rectal bleeding    GI bleed 11/07/2018   Acute posthemorrhagic anemia 11/07/2018   GI bleeding 11/07/2018   PCP:  Patient, No Pcp Per Pharmacy:   [id number] Gin[place] Otto, [place] - [address] AT [facility] OF [address] & [address] [place] [address] Phone: [phone] Fax: [phone]  [facility] [address] [place] [address] Phone: [phone] Fax: [phone]     Social Determinants of Health (SDOH) Social History: SDOH Screenings   Food Insecurity: No Food Insecurity (09/06/2023)  Housing: Patient Declined (09/06/2023)  Transportation Needs: No Transportation Needs (09/06/2023)  Utilities: Not At Risk (09/06/2023)  Depression (PHQ2-9): Low Risk  (08/13/2023)  Tobacco Use: Low Risk  (09/06/2023)  Recent Concern: Tobacco Use - Medium Risk (09/03/2023)   Received from [facility]   SDOH Interventions:     Readmission Risk Interventions     No data to display

## 2023-09-09 LAB — BASIC METABOLIC PANEL
Anion gap: 7 (ref 5–15)
BUN: <5 mg/dL — ABNORMAL LOW (ref 6–20)
CO2: 23 mmol/L (ref 22–32)
Calcium: 7.9 mg/dL — ABNORMAL LOW (ref 8.9–10.3)
Chloride: 106 mmol/L (ref 98–111)
Creatinine, Ser: 0.54 mg/dL — ABNORMAL LOW (ref 0.61–1.24)
GFR, Estimated: >60 mL/min (ref >60)
Glucose, Bld: 107 mg/dL — ABNORMAL HIGH (ref 70–99)
Potassium: 3.8 mmol/L (ref 3.5–5.1)
Sodium: 136 mmol/L (ref 135–145)

## 2023-09-09 NOTE — Progress Notes (Addendum)
   09/09/23 1458  Mobility  Activity Ambulated independently to bathroom;Ambulated independently in hallway  Level of Assistance Standby assist, set-up cues, supervision of patient - no hands on  Assistive Device None;Other (Comment) (IV Pole to BR)  Distance Ambulated (ft) 270 ft  Activity Response Tolerated fair  Mobility Referral Yes  $Mobility charge 1 Mobility  Mobility Specialist Start Time (ACUTE ONLY) 1430  Mobility Specialist Stop Time (ACUTE ONLY) 1445  Mobility Specialist Time Calculation (min) (ACUTE ONLY) 15 min   Mobility Specialist: Progress Note  Pt agreeable to mobility session - received in bed. Required SV throughout using IV pole only to BR and no AD throughout rest of session with c/o slight dizziness with exertion. Pt stated when he looks ahead the dizziness increases compared to looking down when ambulating. Pt returned to bed with all needs met - call bell within reach.   Barnie Mort, BS Mobility Specialist Please contact via SecureChat or Rehab office at [phone].

## 2023-09-09 NOTE — Progress Notes (Signed)
Patient ID: Carl Briggs, male   DOB: 1971-06-01, 52 y.o.   MRN: [id number] [facility] Surgery Progress Note     Subjective: Some nausea yesterday after eating heavier food, but no vomiting.  Improved today.  Less drainage from JP drain site.  Daughter on the phone who speaks English   Objective: Vital signs in last 24 hours: Temp:  [97.7 F (36.5 C)-98 F (36.7 C)] 97.7 F (36.5 C) (09/17 0800) Pulse Rate:  [61-68] 61 (09/17 0800) Resp:  [16-19] 17 (09/17 0800) BP: (100-107)/(62-70) 102/62 (09/17 0800) SpO2:  [97 %-99 %] 97 % (09/17 0800) Last BM Date : 09/07/23  Intake/Output from previous day: 09/16 0701 - 09/17 0700 In: 4683.9 [P.O.:360; I.V.:4260.4; IV Piggyback:63.5] Out: 1700 [Urine:1250; Stool:450] Intake/Output this shift: Total I/O In: 480 [P.O.:480] Out: 250 [Urine:250]  PE: Gen:  Alert, NAD, pleasant Abd: soft, ND, open midline wound with healthy granulation tissue, ostomy viable with semisolid light light brown stool in bag. Old drain site in the right abdomen with small opening and thin purulent drainage but less today than yesterday.  No cellulitis  Lab Results:  Recent Labs    09/07/23 0239 09/08/23 0234  WBC 17.4* 6.9  HGB 8.9* 9.1*  HCT 27.7* 27.5*  PLT 374 401*   BMET Recent Labs    09/08/23 0234 09/09/23 0343  NA 131* 136  K 3.3* 3.8  CL 105 106  CO2 19* 23  GLUCOSE 116* 107*  BUN 6 <5*  CREATININE 0.55* 0.54*  CALCIUM 7.7* 7.9*   PT/INR No results for input(s): "LABPROT", "INR" in the last 72 hours.  CMP     Component Value Date/Time   NA 136 09/09/2023 0343   NA 141 12/14/2018 1545   K 3.8 09/09/2023 0343   CL 106 09/09/2023 0343   CO2 23 09/09/2023 0343   GLUCOSE 107 (H) 09/09/2023 0343   BUN <5 (L) 09/09/2023 0343   BUN 14 12/14/2018 1545   CREATININE 0.54 (L) 09/09/2023 0343   CALCIUM 7.9 (L) 09/09/2023 0343   PROT 7.9 09/06/2023 0323   PROT 7.3 12/14/2018 1545   ALBUMIN 2.5 (L) 09/06/2023 0323   ALBUMIN  4.1 12/14/2018 1545   AST 48 (H) 09/06/2023 0323   ALT 63 (H) 09/06/2023 0323   ALKPHOS 149 (H) 09/06/2023 0323   BILITOT 1.1 09/06/2023 0323   BILITOT 0.4 12/14/2018 1545   GFRNONAA >60 09/09/2023 0343   GFRAA 118 12/14/2018 1545   Lipase     Component Value Date/Time   LIPASE 31 09/06/2023 0323       Studies/Results: No results found.  Anti-infectives: Anti-infectives (From admission, onward)    Start     Dose/Rate Route Frequency Ordered Stop   09/06/23 0900  piperacillin-tazobactam (ZOSYN) IVPB 3.375 g        3.375 g 12.5 mL/hr over 240 Minutes Intravenous Every 8 hours 09/06/23 0845 09/13/23 0559   09/06/23 0445  ceFEPIme (MAXIPIME) 2 g in sodium chloride 0.9 % 100 mL IVPB        2 g 200 mL/hr over 30 Minutes Intravenous  Once 09/06/23 0439 09/06/23 0555   09/06/23 0445  metroNIDAZOLE (FLAGYL) IVPB 500 mg        500 mg 100 mL/hr over 60 Minutes Intravenous  Once 09/06/23 0439 09/06/23 0659   09/06/23 0445  vancomycin (VANCOCIN) IVPB 1000 mg/200 mL premix  Status:  Discontinued        1,000 mg 200 mL/hr over 60 Minutes Intravenous  Once  09/06/23 0439 09/06/23 0443   09/06/23 0445  vancomycin (VANCOREADY) IVPB 1500 mg/300 mL        1,500 mg 150 mL/hr over 120 Minutes Intravenous  Once 09/06/23 0444 09/06/23 0904        Assessment/Plan Right sided abdominal abscess 6 weeks s/p RIGHT COLECTOMY, DRAINAGE RETROPERITONEAL ABSCESS, 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 Janee Morn for perforated colitis with RTP abscess - Abscess spontaneously draining. Change dry dressing BID and PRN saturation. Continue IV antibiotics. - Daily wet to dry dressing change to midline abdominal wound with mepitel at the base -cont IV abx therapy -tolerating solid diet and ileostomy working well -continue another 24-48 hrs of abx therapy prior to DC home. -d/w patient and family on the phone   ID - Zosyn  FEN - decrease IVF, Regular diet VTE - SCDs, lovenox Foley - none   Hyponatremia - chronic, on  sodium bicarb tabs at home - reordered. Na 136 today, stable Hx HTN Recent admit for elevated troponin, felt likely demand ischemia - left heart cath negative for CAD  Hypokalemia - improved to 3.8    LOS: 3 days    Letha Cape, [facility] Surgery 09/09/2023, 10:44 AM Please see Amion for pager number during day hours 7:00am-4:30pm

## 2023-09-09 NOTE — Consult Note (Addendum)
WOC Nurse ostomy consult note Consult requested regarding questions patient had regarding ostomy supplies.  This was already performed yesterday and when I checked back today, patient denies need for further assistance.  Supplies at the bedside which were ordered yesterday.  According to the progress notes, patient does not have insurance and has been puying pouches out of pocket.  Called his daughter on the phone who speaks English and provided them with the [employer] information and also information regarding the outpatient ostomy clinic.  Please re-consult if further assistance is needed.  Thank-you,  Cammie Mcgee MSN, RN, CWOCN, [place], CNS [phone]

## 2023-09-10 LAB — CBC
HCT: 30 % — ABNORMAL LOW (ref 39.0–52.0)
Hemoglobin: 9.9 g/dL — ABNORMAL LOW (ref 13.0–17.0)
MCH: 28.7 pg (ref 26.0–34.0)
MCHC: 33 g/dL (ref 30.0–36.0)
MCV: 87 fL (ref 80.0–100.0)
Platelets: 545 10*3/uL — ABNORMAL HIGH (ref 150–400)
RBC: 3.45 MIL/uL — ABNORMAL LOW (ref 4.22–5.81)
RDW: 14.4 % (ref 11.5–15.5)
WBC: 7.9 10*3/uL (ref 4.0–10.5)
nRBC: 0.4 % — ABNORMAL HIGH (ref 0.0–0.2)

## 2023-09-10 LAB — BASIC METABOLIC PANEL WITH GFR
Anion gap: 9 (ref 5–15)
BUN: <5 mg/dL — ABNORMAL LOW (ref 6–20)
CO2: 25 mmol/L (ref 22–32)
Calcium: 8.1 mg/dL — ABNORMAL LOW (ref 8.9–10.3)
Chloride: 102 mmol/L (ref 98–111)
Creatinine, Ser: 0.62 mg/dL (ref 0.61–1.24)
GFR, Estimated: >60 mL/min (ref >60)
Glucose, Bld: 154 mg/dL — ABNORMAL HIGH (ref 70–99)
Potassium: 3.3 mmol/L — ABNORMAL LOW (ref 3.5–5.1)
Sodium: 136 mmol/L (ref 135–145)

## 2023-09-10 MED ORDER — POTASSIUM CHLORIDE CRYS ER 20 MEQ PO TBCR
40.0000 meq | EXTENDED_RELEASE_TABLET | Freq: Once | ORAL | Status: AC
  Administered 2023-09-10: 40 meq via ORAL
  Filled 2023-09-10: qty 2

## 2023-09-10 NOTE — Progress Notes (Signed)
   09/10/23 1030  Mobility  Activity Ambulated independently in hallway  Level of Assistance Standby assist, set-up cues, supervision of patient - no hands on  Assistive Device Other (Comment) (Iv Pole)  Distance Ambulated (ft) 400 ft  Activity Response Tolerated fair  Mobility Referral Yes  $Mobility charge 1 Mobility  Mobility Specialist Start Time (ACUTE ONLY) 1015  Mobility Specialist Stop Time (ACUTE ONLY) 1028  Mobility Specialist Time Calculation (min) (ACUTE ONLY) 13 min   Mobility Specialist: Progress Note  Pt agreeable to mobility session - received in bed. Required SV using IV Pole with c/o ongoing mild dizziness. Pt stated that his pain comes and goes.  Pt returned to bed with all needs met - call bell within reach. RN present.   Barnie Mort, BS Mobility Specialist Please contact via SecureChat or Rehab office at [phone].

## 2023-09-10 NOTE — Progress Notes (Signed)
Patient ID: Tannor Pretorius, male   DOB: 1971/01/07, 52 y.o.   MRN: [id number] [facility] Surgery Progress Note     Subjective: Nausea improved.  Mild dizziness when sitting up to eat today.  No further since then.  Otherwise feeling well.  Daughter interpreted over the phone.  Objective: Vital signs in last 24 hours: Temp:  [97.6 F (36.4 C)-98.2 F (36.8 C)] 97.6 F (36.4 C) (09/18 0739) Pulse Rate:  [58-108] 108 (09/18 0739) Resp:  [17-18] 18 (09/18 0739) BP: (101-123)/(73-78) 101/73 (09/18 0739) SpO2:  [98 %-100 %] 98 % (09/18 0739) Last BM Date : 09/10/23  Intake/Output from previous day: 09/17 0701 - 09/18 0700 In: 960 [P.O.:960] Out: 1375 [Urine:1100; Stool:275] Intake/Output this shift: No intake/output data recorded.  PE: Gen:  Alert, NAD, pleasant Abd: soft, ND, open midline wound with healthy granulation tissue, ostomy viable with semisolid light light brown stool in bag. Old drain site in the right abdomen with small opening and minimal drainage today.  No cellulitis.  Lab Results:  Recent Labs    09/08/23 0234  WBC 6.9  HGB 9.1*  HCT 27.5*  PLT 401*   BMET Recent Labs    09/08/23 0234 09/09/23 0343  NA 131* 136  K 3.3* 3.8  CL 105 106  CO2 19* 23  GLUCOSE 116* 107*  BUN 6 <5*  CREATININE 0.55* 0.54*  CALCIUM 7.7* 7.9*   PT/INR No results for input(s): "LABPROT", "INR" in the last 72 hours.  CMP     Component Value Date/Time   NA 136 09/09/2023 0343   NA 141 12/14/2018 1545   K 3.8 09/09/2023 0343   CL 106 09/09/2023 0343   CO2 23 09/09/2023 0343   GLUCOSE 107 (H) 09/09/2023 0343   BUN <5 (L) 09/09/2023 0343   BUN 14 12/14/2018 1545   CREATININE 0.54 (L) 09/09/2023 0343   CALCIUM 7.9 (L) 09/09/2023 0343   PROT 7.9 09/06/2023 0323   PROT 7.3 12/14/2018 1545   ALBUMIN 2.5 (L) 09/06/2023 0323   ALBUMIN 4.1 12/14/2018 1545   AST 48 (H) 09/06/2023 0323   ALT 63 (H) 09/06/2023 0323   ALKPHOS 149 (H) 09/06/2023 0323   BILITOT  1.1 09/06/2023 0323   BILITOT 0.4 12/14/2018 1545   GFRNONAA >60 09/09/2023 0343   GFRAA 118 12/14/2018 1545   Lipase     Component Value Date/Time   LIPASE 31 09/06/2023 0323       Studies/Results: No results found.  Anti-infectives: Anti-infectives (From admission, onward)    Start     Dose/Rate Route Frequency Ordered Stop   09/06/23 0900  piperacillin-tazobactam (ZOSYN) IVPB 3.375 g        3.375 g 12.5 mL/hr over 240 Minutes Intravenous Every 8 hours 09/06/23 0845 09/13/23 0559   09/06/23 0445  ceFEPIme (MAXIPIME) 2 g in sodium chloride 0.9 % 100 mL IVPB        2 g 200 mL/hr over 30 Minutes Intravenous  Once 09/06/23 0439 09/06/23 0555   09/06/23 0445  metroNIDAZOLE (FLAGYL) IVPB 500 mg        500 mg 100 mL/hr over 60 Minutes Intravenous  Once 09/06/23 0439 09/06/23 0659   09/06/23 0445  vancomycin (VANCOCIN) IVPB 1000 mg/200 mL premix  Status:  Discontinued        1,000 mg 200 mL/hr over 60 Minutes Intravenous  Once 09/06/23 0439 09/06/23 0443   09/06/23 0445  vancomycin (VANCOREADY) IVPB 1500 mg/300 mL  1,500 mg 150 mL/hr over 120 Minutes Intravenous  Once 09/06/23 0444 09/06/23 0904        Assessment/Plan Right sided abdominal abscess 6 weeks s/p RIGHT COLECTOMY, DRAINAGE RETROPERITONEAL ABSCESS, 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 Janee Morn for perforated colitis with RTP abscess - Abscess spontaneously draining. Change dry dressing BID and PRN saturation. Continue IV antibiotics.  Much improved today. - Daily wet to dry dressing change to midline abdominal wound with mepitel at the base -cont IV abx therapy -tolerating solid diet and ileostomy working well -continue another 24hrs of abx therapy prior to DC home. -d/w patient and family on the phone -plan DC home tomorrow   ID - Zosyn  FEN - decrease IVF, Regular diet VTE - SCDs, lovenox Foley - none   Hyponatremia - chronic, on sodium bicarb tabs at home - reordered. Na 136 today, stable Hx HTN Recent admit for  elevated troponin, felt likely demand ischemia - left heart cath negative for CAD  Hypokalemia - improved to 3.8    LOS: 4 days    Letha Cape, [facility] 09/10/2023, 9:10 AM Please see Amion for pager number during day hours 7:00am-4:30pm

## 2023-09-10 NOTE — Plan of Care (Signed)

## 2023-09-10 NOTE — TOC Progression Note (Signed)
Transition of Care (TOC) - Progression Note   Anticipated discharge is tomorrow . Tresa Endo with [facility] aware  Patient Details  Name: Carl Briggs MRN: [id number] Date of Birth: 03-Dec-1971  Transition of Care [facility]) CM/SW Contact  Keymani Glynn, Adria Devon, RN Phone Number: 09/10/2023, 9:59 AM  Clinical Narrative:       Expected Discharge Plan: Home w Home Health Services Barriers to Discharge: Continued Medical Work up  Expected Discharge Plan and Services In-house Referral: Financial Counselor Discharge Planning Services: CM Consult Post Acute Care Choice: Home Health Living arrangements for the past 2 months: Single Family Home                   DME Agency: NA       HH Arranged: RN HH Agency: [facility] Date HH Agency Contacted: 09/08/23 Time HH Agency Contacted: 1457 Representative spoke with at [facility] Agency: Brandi   Social Determinants of Health (SDOH) Interventions SDOH Screenings   Food Insecurity: No Food Insecurity (09/06/2023)  Housing: Patient Declined (09/06/2023)  Transportation Needs: No Transportation Needs (09/06/2023)  Utilities: Not At Risk (09/06/2023)  Depression (PHQ2-9): Low Risk  (08/13/2023)  Tobacco Use: Low Risk  (09/06/2023)  Recent Concern: Tobacco Use - Medium Risk (09/03/2023)   Received from [facility]    Readmission Risk Interventions     No data to display

## 2023-09-10 NOTE — Plan of Care (Signed)

## 2023-09-11 LAB — CULTURE, BLOOD (ROUTINE X 2)
Culture: NO GROWTH
Culture: NO GROWTH
Special Requests: ADEQUATE
Special Requests: ADEQUATE

## 2023-09-11 MED ORDER — OXYCODONE HCL 5 MG PO TABS: 5.0000 mg | ORAL_TABLET | Freq: Four times a day (QID) | ORAL | 0 refills | Status: DC | PRN

## 2023-09-11 MED ORDER — NUTRISOURCE FIBER PO PACK: 1.0000 | PACK | Freq: Two times a day (BID) | ORAL | Status: DC

## 2023-09-11 MED ORDER — ACETAMINOPHEN 500 MG PO TABS: 1000.0000 mg | ORAL_TABLET | Freq: Four times a day (QID) | ORAL | Status: DC | PRN

## 2023-09-11 MED ORDER — AMOXICILLIN-POT CLAVULANATE 875-125 MG PO TABS: 1.0000 | ORAL_TABLET | Freq: Two times a day (BID) | ORAL | 0 refills | Status: AC

## 2023-09-11 NOTE — Discharge Summary (Addendum)
Patient ID: Carl Briggs [id number] 08-03-1971 52 y.o.  Admit date: 09/06/2023 Discharge date: 09/11/2023  Admitting Diagnosis: Right sided abdominal abscess 6 weeks s/p RIGHT COLECTOMY, DRAINAGE RETROPERITONEAL ABSCESS, 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 Janee Morn for perforated colitis with RTP abscess Hyponatremia HTN  Discharge Diagnosis Patient Active Problem List   Diagnosis Date Noted   Postoperative infection 09/06/2023   Irritant contact dermatitis associated with fecal stoma 08/22/2023   Ileostomy care (HCC) 08/22/2023   Bowel perforation (HCC) 07/25/2023   Pneumoperitoneum 07/25/2023   Colitis 07/22/2023   Elevated troponin 07/20/2023   Chest pain 08/11/2021   SIRS (systemic inflammatory response syndrome) (HCC) 08/11/2021   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   Metabolic acidosis with increased anion gap and accumulation of organic acids 08/11/2021   Hyponatremia 08/11/2021   Hyperbilirubinemia 08/11/2021   Elevated AST (SGOT) 08/11/2021   Dyspnea 08/11/2021   Low back pain 10/03/2020   Rectal bleeding    GI bleed 11/07/2018   Acute posthemorrhagic anemia 11/07/2018   GI bleeding 11/07/2018    Consultants none  Reason for Admission: Carl Briggs is a 52 y.o. male S/P emergent Right colectomy, drainage of retroperitoneal abscess, ileostomy 07/25/2023 by Dr. Janee Morn for perforated right colon. He was seen in our office 3 days ago and doing well at that point. Two days ago he developed worsening right sided abdominal pain, nausea, weakness, SOB, and diarrhea. No emesis. He had a single fever 4 days ago that resolved. States that he has not been able to eat/drink much at home over the last few days. Abdominal drain removed about 3 weeks ago. He completed his oral antibiotics last week.   CT today shows insinuating abscess in the right abdomen spanning the subhepatic space to iliac fossa, up to 2 cm in thickness and largely following the path of the surgical  drain seen on prior, including extending through the right rectus abdominus into the subcutaneous space; enteritis of adjacent small bowel loops with slow transit. CTA negative for PE.  Procedures none  Hospital Course:  The patient was admitted for IV abx therapy.  This fluid collection noted on CT was spontaneously draining out of his old JP drain incisions.  This dramatically decreased while here to the point of no further drainage.  His colostomy continued to work well.  He tolerated a solid diet.  His midline wound continued with WD dressings, but mepitel was added to help protect the base of his wound given his dehiscence.  He was stable on HD 5 for DC home with 7 more days of oral augmentin and follow up in 2 weeks in our office.  Physical Exam: Gen:  Alert, NAD, pleasant Abd: soft, ND, open midline wound with healthy granulation tissue, ostomy viable with semisolid light light brown stool in bag. Old drain site in the right abdomen with small opening and no output.  No cellulitis.   Allergies as of 09/11/2023   No Known Allergies      Medication List     STOP taking these medications    methocarbamol 500 MG tablet Commonly known as: ROBAXIN       TAKE these medications    acetaminophen 500 MG tablet Commonly known as: TYLENOL Take 2 tablets (1,000 mg total) by mouth every 6 (six) hours as needed for mild pain, moderate pain, fever or headache.   ALKA-SELTZER PO Take 2 tablets by mouth 2 (two) times daily as needed (stomach upset).   amoxicillin-clavulanate 875-125 MG tablet Commonly known as:  AUGMENTIN Take 1 tablet by mouth 2 (two) times daily for 7 days.   aspirin EC 81 MG tablet Take 1 tablet (81 mg total) by mouth daily. Swallow whole.   dicyclomine 20 MG tablet Commonly known as: BENTYL Take 1 tablet (20 mg total) by mouth 4 (four) times daily -  before meals and at bedtime for 5 days.   docusate sodium 100 MG capsule Commonly known as: COLACE Take 1  capsule (100 mg total) by mouth 2 (two) times daily. What changed:  when to take this reasons to take this   escitalopram 10 MG tablet Commonly known as: LEXAPRO Take 1 tablet (10 mg total) by mouth daily.   fiber Pack packet Take 1 packet by mouth 2 (two) times daily.   folic acid 1 MG tablet Commonly known as: FOLVITE Tome 1 tableta (1 mg en total) por va oral diariamente. (Take 1 tablet (1 mg total) by mouth daily.)   ibuprofen 200 MG tablet Commonly known as: ADVIL Take 400 mg by mouth every 6 (six) hours as needed for headache or moderate pain.   oxyCODONE 5 MG immediate release tablet Commonly known as: Oxy IR/ROXICODONE Take 1 tablet (5 mg total) by mouth every 6 (six) hours as needed for severe pain. What changed:  medication strength how much to take reasons to take this   pantoprazole 40 MG tablet Commonly known as: PROTONIX Take 1 tablet (40 mg total) by mouth daily.   sodium bicarbonate 650 MG tablet Take 1 tablet (650 mg total) by mouth 2 (two) times daily.          Follow-up Information     [facility] Follow up.   Specialty: [facility] Contact information: [address] [place] [address] [place] [address] [phone]         Jeral Pinch, DO Follow up.   Specialty: Internal Medicine Why: September 26, 2023 at 0845 Contact information: [address] [place] [address] [phone]         Violeta Gelinas, MD Follow up on 09/17/2023.   Specialty: General Surgery Why: 11:50 am, Arrive 15 minutes prior to your appointment time, Please bring your insurance card and photo ID Contact information: [address] [place] [address] [place] [address] [phone]                 Signed: Barnetta Chapel, [facility] 09/11/2023, 11:14 AM Please see Amion for pager number during day hours 7:00am-4:30pm, 7-11:30am on Weekends

## 2023-09-11 NOTE — Progress Notes (Signed)
[facility] to be D/C'd  per MD order.  Discussed with the patient and all questions fully answered.  VSS, Skin clean, dry and intact without evidence of skin break down, no evidence of skin tears noted.  IV catheter discontinued intact. Site without signs and symptoms of complications. Dressing and pressure applied.  An After Visit Summary was printed and given to the patient. Pt given supplies to for ostomy and midline incision.  D/c education completed with patient/family including follow up instructions, medication list, d/c activities limitations if indicated, with other d/c instructions as indicated by MD - patient able to verbalize understanding, all questions fully answered.   Patient instructed to return to ED, call 911, or call MD for any changes in condition.   Patient to be escorted via WC, and D/C home via private auto.

## 2023-09-11 NOTE — Discharge Instructions (Signed)
CUIDADO DE LA HERIDA: - El vendaje de la lnea media se debe cambiar una vez diariamente - Suministros: solucin salina estril, gasa, tijeras, cinta adhesiva - Retire el vendaje y todo el material de relleno con cuidado, humedecindolo con solucin salina estril segn sea necesario para Consulting civil engineer de relleno o el apsito interno se adhieran a la herida. - Limpie los bordes de la piel alrededor de la herida con agua o gasa, asegurndose de que no queden restos de [place] ni fugas en la piel que puedan causar irritacin o dao a la piel. - Coloque primero Mepitel en la base de la herida. (Esto se puede reutilizar durante 5 das antes de [place]) Humedezca y limpie la gasa con solucin salina estril y empaquete la herida desde la base de la herida [employer] nivel de la piel, asegurndose de tomar nota de las posibles reas de seguimiento de la herida, tunelizacin y empaquete de [place]. La herida se puede empacar sin apretar. Recorte la gasa al tamao adecuado si no se necesita una gasa completa. Baruch Gouty la herida con una gasa seca y asegrela con cinta adhesiva. - Escriba la fecha y la hora en el apsito seco o la cinta adhesiva para controlar mejor cundo se realiz el ltimo cambio de apsito. - Cambie el apsito segn sea necesario si se produce una fuga, la herida se contamina o el paciente solicita ducharse. - El paciente puede ducharse diariamente con la herida abierta (es decir, quitar todo el vendaje) y despus de la ducha se debe secar la herida y Scientific laboratory technician un vendaje limpio.  WOUND CARE: - midline dressing to be changed daily - supplies: sterile saline, gauze, scissors, tape  - remove dressing and all packing carefully, moistening with sterile saline as needed to avoid packing/internal dressing sticking to the wound. - clean edges of skin around the wound with water/gauze, making sure there is no tape debris or leakage left on skin that could cause skin irritation  or breakdown. - dampen and clean gauze with sterile saline and pack wound from wound base to skin level, making sure to take note of any possible areas of wound tracking, tunneling and packing appropriately. Wound can be packed loosely. Trim gauze to size if a whole gauze is not required. - cover wound with a dry gauze and secure with tape.  - write the date/time on the dry dressing/tape to better track when the last dressing change occurred. - change dressing as needed if leakage occurs, wound gets contaminated, or patient requests to shower. - patient may shower daily with wound open (i.e. remove all packing) and following the shower the wound should be dried and a clean dressing placed.

## 2023-09-25 NOTE — Progress Notes (Addendum)
Established Patient Office Visit  Subjective   Patient ID: Carl Briggs, male    DOB: 08/06/1971  Age: 52 y.o. MRN: [id number]  Chief Complaint  Patient presents with   Follow-up    ROUTINE FOLLOW FROM HOSPITAL / request something for being anxious at times    HPI This is a 52 year old male living with a history stated below and presents today for hospital follow-up and establish care. Please see problem based assessment and plan for additional details.   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   Sodium bicarbonate 650 mg BID  Augmentin  BID for 7 days- completed   Allergies: None   Social history: Living with: [place], lives with wife  Employment: None  Functionality: Independent  Smoke: None, former smoker.  Alcohol: None  Substances: none  Prior PCP:  Other PCPs, can not recall name.   Family history: Mother: DM.  Father: brain tumor.   Patient Active Problem List   Diagnosis Date Noted   Prediabetes 09/26/2023   Acid reflux 09/26/2023   Anxiety 09/26/2023   Postoperative infection 09/06/2023   Irritant contact dermatitis associated with fecal stoma 08/22/2023   Ileostomy care (HCC) 08/22/2023   Elevated AST (SGOT) 08/11/2021   Past Medical History:  Diagnosis Date   Anxiety    GI bleed 11/07/2018   High cholesterol    Hypertension    Past Surgical History:  Procedure Laterality Date   COLECTOMY N/A 07/25/2023   Procedure: OPEN  RIGHT COLECTOMY;  Surgeon: Violeta Gelinas, MD;  Location: [facility] OR;  Service: General;  Laterality: N/A;   COLONOSCOPY  WITH PROPOFOL N/A 11/09/2018   Procedure: COLONOSCOPY WITH PROPOFOL;  Surgeon: Willis Modena, MD;  Location: [facility] ENDOSCOPY;  Service: Endoscopy;  Laterality: N/A;   LEFT HEART CATH AND CORONARY ANGIOGRAPHY N/A 07/23/2023   Procedure: LEFT HEART CATH AND CORONARY ANGIOGRAPHY;  Surgeon: Kathleene Hazel, MD;  Location: [facility] INVASIVE CV LAB;  Service: Cardiovascular;  Laterality: N/A;   Social History   Socioeconomic History   Marital status: Married    Spouse name: Not on file   Number of children: Not on file   Years of education: Not on file   Highest education level: Not on file  Occupational History   Not on file  Tobacco Use   Smoking status: Never   Smokeless tobacco: Never  Vaping Use   Vaping status: Never Used  Substance and Sexual Activity   Alcohol use: Not Currently   Drug use: Not on file   Sexual activity: Yes  Other Topics Concern   Not on file  Social History Narrative   Not on file   Social Determinants of Health   Financial Resource Strain: Not on file  Food Insecurity: No Food Insecurity (09/06/2023)   Hunger Vital Sign    Worried About Running Out of Food in the Last Year: Never true    Ran Out of Food in the Last Year: Never true  Transportation Needs: No Transportation Needs (09/06/2023)   PRAPARE - Administrator, Civil Service (Medical): No    Lack of Transportation (Non-Medical): No  Physical Activity: Not on file  Stress: Not on file  Social Connections: Not on file  Intimate Partner Violence: Not At Risk (09/06/2023)   Humiliation, Afraid, Rape, and Kick questionnaire    Fear of Current or Ex-Partner: No    Emotionally Abused: No    Physically Abused: No    Sexually Abused: No   Family Status  Relation Name Status   Mother  (Not Specified)  No partnership data on file   Family History  Problem Relation Age of Onset   Diabetes Mother    No Known Allergies  Review of Systems  Constitutional:  Positive for  malaise/fatigue. Negative for chills and fever.  HENT:  Negative for congestion and sore throat.   Eyes:  Negative for blurred vision.  Respiratory:  Negative for cough and shortness of breath.   Cardiovascular:  Positive for chest pain. Negative for palpitations and leg swelling.  Gastrointestinal:  Positive for heartburn and nausea. Negative for abdominal pain, blood in stool, constipation, diarrhea and vomiting.  Genitourinary:  Negative for dysuria.  Skin:  Negative for rash.  Neurological:  Negative for dizziness, weakness and headaches.  Psychiatric/Behavioral:  The patient is nervous/anxious.       Objective:     BP 98/75 (BP Location: Right Arm, Patient Position: Sitting, Cuff Size: Normal)   Pulse 70   Temp 97.7 F (36.5 C) (Oral)   Ht 5\' 1"  (1.549 m)   Wt 141 lb 8 oz (64.2 kg)   SpO2 99%   BMI 26.74 kg/m  BP Readings from Last 3 Encounters:  09/26/23 98/75  09/11/23 120/79  08/22/23 112/76   Wt Readings from Last 3 Encounters:  09/26/23 141 lb 8 oz (64.2 kg)  09/06/23 138 lb 0.1 oz (62.6 kg)  08/13/23 139 lb (63 kg)      Physical Exam    No results found for any visits on 09/26/23.  Last CBC Lab Results  Component Value Date   WBC 7.9 09/10/2023   HGB 9.9 (L) 09/10/2023   HCT 30.0 (L) 09/10/2023   MCV 87.0 09/10/2023   MCH 28.7 09/10/2023   RDW 14.4 09/10/2023   PLT 545 (H) 09/10/2023   Last metabolic panel Lab Results  Component Value Date   GLUCOSE 154 (H) 09/10/2023   NA 136 09/10/2023   K 3.3 (L) 09/10/2023   CL 102 09/10/2023   CO2 25 09/10/2023   BUN <5 (L) 09/10/2023   CREATININE 0.62 09/10/2023   GFRNONAA >60 09/10/2023   CALCIUM 8.1 (L) 09/10/2023   PHOS 2.9 08/12/2021   PROT 7.9 09/06/2023   ALBUMIN 2.5 (L) 09/06/2023   LABGLOB 3.2 12/14/2018   AGRATIO 1.3 12/14/2018   BILITOT 1.1 09/06/2023   ALKPHOS 149 (H) 09/06/2023   AST 48 (H) 09/06/2023   ALT 63 (H) 09/06/2023   ANIONGAP 9 09/10/2023   Last lipids Lab Results   Component Value Date   CHOL 126 07/21/2023   HDL 40 (L) 07/21/2023   LDLCALC 64 07/21/2023   TRIG 111 07/21/2023   CHOLHDL 3.2 07/21/2023   Last hemoglobin A1c Lab Results  Component Value Date   HGBA1C 5.8 (H) 08/13/2021   Last thyroid functions Lab Results  Component Value Date   TSH 1.101 10/09/2022     The ASCVD Risk score (Arnett DK, et al., 2019) failed to calculate for the following reasons:   The patient has a prior MI or stroke diagnosis    Assessment & Plan:   Problem List Items Addressed This Visit  Digestive   Acid reflux    Patient reports nausea and burning sensation in his chest. Patient reports it is worse when he is eating meals.  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 Janee Morn for perforated right colon. Patient was admitted on September 06, 2023 for abscess in the right abdomen. He was started on IV antibiotic therapy, and was discharged on September 11, 2023 with 7 days of oral Augmentin. He is following with his surgeons. Patient denies any abdominal pain, fever or chills, intermittent nausea no vomiting. He is eating and drinking good. No concerns.  Per the physical exam, there is a midline wound with appropriate dressing.  No tenderness to palpation of the abdomen.  Ileostomy with appropriate output.  Patient reports overall he is doing a lot better.  He is improving.  He has no other concerns at this time.  Patient was told that we will refer him to gastroenterologist, to identify the cause of his bowel perforation.  Otherwise continue following up with his surgeon and follow their recommendations. -We will recheck his labs, CMP and CBC -Referral sent to gastroenterologist -Continue following up with general surgery      Relevant Orders   CBC no  Diff   CMP14 + Anion Gap   Ambulatory referral to Gastroenterology   Prediabetes    Lab Results  Component Value Date   HGBA1C 5.8 (H) 08/13/2021  Patient reports previously he was diagnosed with prediabetes. No medications in the past.  - Recommended lifestyle modification.       Anxiety    GAD-7 score of 17, patient reports episodes of anxiety, more days of the week.  Patient reports that he thinks about all that has happened to him in the past several months.  He is concerned about his medical conditions.  He is concerned about the surgery he recently had. -Start Lexapro 10 mg daily -Will reevaluate the patient in the next office visit.       Relevant Medications   escitalopram (LEXAPRO) 10 MG tablet    Return in about 4 months (around 01/27/2024) for 4-6 months as needed .    Jeral Pinch, DO

## 2023-09-26 ENCOUNTER — Ambulatory Visit (INDEPENDENT_AMBULATORY_CARE_PROVIDER_SITE_OTHER): Payer: Self-pay | Admitting: Student

## 2023-09-26 ENCOUNTER — Other Ambulatory Visit: Payer: Self-pay

## 2023-09-26 ENCOUNTER — Encounter: Payer: Self-pay | Admitting: Student

## 2023-09-26 VITALS — BP 98/75 | HR 70 | Temp 97.7°F | Ht 61.0 in | Wt 141.5 lb

## 2023-09-26 DIAGNOSIS — R7303 Prediabetes: Secondary | ICD-10-CM

## 2023-09-26 DIAGNOSIS — F419 Anxiety disorder, unspecified: Secondary | ICD-10-CM

## 2023-09-26 DIAGNOSIS — K219 Gastro-esophageal reflux disease without esophagitis: Secondary | ICD-10-CM

## 2023-09-26 DIAGNOSIS — T8140XD Infection following a procedure, unspecified, subsequent encounter: Secondary | ICD-10-CM

## 2023-09-26 MED ORDER — ESCITALOPRAM OXALATE 10 MG PO TABS: 10.0000 mg | ORAL_TABLET | Freq: Every day | ORAL | 2 refills | Status: DC

## 2023-09-26 MED ORDER — PANTOPRAZOLE SODIUM 20 MG PO TBEC: 20.0000 mg | DELAYED_RELEASE_TABLET | Freq: Every day | ORAL | 0 refills | Status: DC

## 2023-09-26 NOTE — Patient Instructions (Addendum)
Thank you, Mr.Carl Briggs for allowing Carl Briggs to provide your care today. Today we discussed:   Abdominal Surgery -Continue following up with your surgeon -I have sent referral to GI doctor, who will call you and schedule an appointment. -I have ordered labs in this visit, I will call you and notify the results.  2.  Prediabetes - Diet modification  Foods to eat: Eat foods with low glycaemic index such as steel-cut oats, stone-ground whole wheat bread and non-starchy vegetables, such as carrots and field greens Lean meats, such as chicken without skin, egg whites, soybean products etc. Eggs and dairy products (such as milk, yogurt, and cheese) Soy-based food and beverages Foods to avoid: Sugary beverages Baked goods, such as, cookies, cakes, and pies Frozen desserts Red meat, especially processed Red meats such as hot dogs, bacon, and deli meats Trans fat containing foods such as, Frozen pizza, commercially fried food, etc.  Anxiety Start Lexapro 10 mg, take 1 tablet by mouth daily.  Acid reflux Start Protonix 20 mg, take 1 tablet by mouth to help with acid reflux  I have ordered the following labs for you:  Lab Orders         CBC no Diff         CMP14 + Anion Gap       Tests ordered today:  As above.   Referrals ordered today:   Referral Orders         Ambulatory referral to Gastroenterology       I have ordered the following medication/changed the following medications:   Stop the following medications: Medications Discontinued During This Encounter  Medication Reason   escitalopram (LEXAPRO) 10 MG tablet    sodium bicarbonate 650 MG tablet No longer needed (for PRN medications)   pantoprazole (PROTONIX) 40 MG tablet      Start the following medications: Meds ordered this encounter  Medications   escitalopram (LEXAPRO) 10 MG tablet    Sig: Take 1 tablet (10 mg total) by mouth daily.    Dispense:  30 tablet    Refill:  2   pantoprazole (PROTONIX)  20 MG tablet    Sig: Take 1 tablet (20 mg total) by mouth daily.    Dispense:  30 tablet    Refill:  0     Follow up: 4-6 months   Remember:   Should you have any questions or concerns please call the internal medicine clinic at [phone].     Jeral Pinch, DO [facility]

## 2023-09-26 NOTE — Assessment & Plan Note (Signed)
GAD-7 score of 17, patient reports episodes of anxiety, more days of the week.  Patient reports that he thinks about all that has happened to him in the past several months.  He is concerned about his medical conditions.  He is concerned about the surgery he recently had. -Start Lexapro 10 mg daily -Will reevaluate the patient in the next office visit.

## 2023-09-26 NOTE — Assessment & Plan Note (Signed)
Lab Results  Component Value Date   HGBA1C 5.8 (H) 08/13/2021  Patient reports previously he was diagnosed with prediabetes. No medications in the past.  - Recommended lifestyle modification.

## 2023-09-26 NOTE — Assessment & Plan Note (Addendum)
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 Janee Morn for perforated right colon. Patient was admitted on September 06, 2023 for abscess in the right abdomen. He was started on IV antibiotic therapy, and was discharged on September 11, 2023 with 7 days of oral Augmentin. He is following with his surgeons. Patient denies any abdominal pain, fever or chills, intermittent nausea no vomiting. He is eating and drinking good. No concerns.  Per the physical exam, there is a midline wound with appropriate dressing.  No tenderness to palpation of the abdomen.  Ileostomy with appropriate output.  Patient reports overall he is doing a lot better.  He is improving.  He has no other concerns at this time.  Patient was told that we will refer him to gastroenterologist, to identify the cause of his bowel perforation.  Otherwise continue following up with his surgeon and follow their recommendations. -We will recheck his labs, CMP and CBC -Referral sent to gastroenterologist -Continue following up with general surgery

## 2023-09-26 NOTE — Assessment & Plan Note (Signed)
Patient reports nausea and burning sensation in his chest. Patient reports it is worse when he is eating meals.  -Start Protonix 20 mg daily

## 2023-09-29 LAB — CMP14 + ANION GAP
ALT: 30 IU/L (ref 0–44)
AST: 29 IU/L (ref 0–40)
Albumin: 3.9 g/dL (ref 3.8–4.9)
Alkaline Phosphatase: 117 IU/L (ref 44–121)
Anion Gap: 15 mmol/L (ref 10.0–18.0)
BUN/Creatinine Ratio: 14 (ref 9–20)
BUN: 10 mg/dL (ref 6–24)
Bilirubin Total: 0.3 mg/dL (ref 0.0–1.2)
CO2: 21 mmol/L (ref 20–29)
Calcium: 9.6 mg/dL (ref 8.7–10.2)
Chloride: 103 mmol/L (ref 96–106)
Creatinine, Ser: 0.7 mg/dL — ABNORMAL LOW (ref 0.76–1.27)
Globulin, Total: 3.4 g/dL (ref 1.5–4.5)
Glucose: 98 mg/dL (ref 70–99)
Potassium: 4.3 mmol/L (ref 3.5–5.2)
Sodium: 139 mmol/L (ref 134–144)
Total Protein: 7.3 g/dL (ref 6.0–8.5)
eGFR: 110 mL/min/{1.73_m2} (ref >59)

## 2023-09-29 LAB — CBC
Hematocrit: 39.3 % (ref 37.5–51.0)
Hemoglobin: 12.5 g/dL — ABNORMAL LOW (ref 13.0–17.7)
MCH: 28.6 pg (ref 26.6–33.0)
MCHC: 31.8 g/dL (ref 31.5–35.7)
MCV: 90 fL (ref 79–97)
Platelets: 296 10*3/uL (ref 150–450)
RBC: 4.37 x10E6/uL (ref 4.14–5.80)
RDW: 15.1 % (ref 11.6–15.4)
WBC: 4.8 10*3/uL (ref 3.4–10.8)

## 2023-09-30 NOTE — Progress Notes (Signed)
Internal Medicine Clinic Attending  I was physically present during the key portions of the resident provided service and participated in the medical decision making of patient's management care. I reviewed pertinent patient test results.  The assessment, diagnosis, and plan were formulated together and I agree with the documentation in the resident's note.  Narendra, Nischal, MD  

## 2023-10-23 ENCOUNTER — Other Ambulatory Visit: Payer: Self-pay | Admitting: Student

## 2023-11-05 ENCOUNTER — Encounter: Payer: Self-pay | Admitting: Internal Medicine

## 2023-12-11 ENCOUNTER — Other Ambulatory Visit: Payer: Self-pay | Admitting: Student

## 2024-01-11 ENCOUNTER — Other Ambulatory Visit: Payer: Self-pay | Admitting: Student

## 2024-03-30 ENCOUNTER — Ambulatory Visit: Payer: Self-pay | Admitting: General Surgery

## 2024-03-30 NOTE — Progress Notes (Signed)
 Surgical Instructions   Your procedure is scheduled on Tuesday, April 22nd, 2025. Report to [facility] Main Entrance "A" at 10:30 A.M., then check in with the Admitting office. Any questions or running late day of surgery: call [phone]  Questions prior to your surgery date: call [phone], Monday-Friday, 8am-4pm. If you experience any cold or flu symptoms such as cough, fever, chills, shortness of breath, etc. between now and your scheduled surgery, please notify us at the above number.     Remember:  Do not eat after midnight the night before your surgery   You may drink clear liquids until 9:30 the morning of your surgery.   Clear liquids allowed are: Water, Non-Citrus Juices (without pulp), Carbonated Beverages, Clear Tea (no milk, honey, etc.), Black Coffee Only (NO MILK, CREAM OR POWDERED CREAMER of any kind), and Gatorade.    Take these medicines the morning of surgery with A SIP OF WATER: Escitalopram (Lexapro) Pantoprazole (Protonix)   May take these medicines IF NEEDED: Oxycodone (Oxy IR/Roxicodone) Acetaminophen (Tylenol) Docusate Sodium (Colace)   Please follow your surgeon's instructions on when to stop your Aspirin.  If no instructions received, reach out to your surgeon's office.    One week prior to surgery, STOP taking any Aspirin (unless otherwise instructed by your surgeon) Aleve, Naproxen, Ibuprofen, Motrin, Advil, Goody's, BC's, all herbal medications, fish oil, and non-prescription vitamins.                     Do NOT Smoke (Tobacco/Vaping) for 24 hours prior to your procedure.  If you use a CPAP at night, you may bring your mask/headgear for your overnight stay.   You will be asked to remove any contacts, glasses, piercing's, hearing aid's, dentures/partials prior to surgery. Please bring cases for these items if needed.    Patients discharged the day of surgery will not be allowed to drive home, and someone needs to stay with them for 24  hours.  SURGICAL WAITING ROOM VISITATION Patients may have no more than 2 support people in the waiting area - these visitors may rotate.   Pre-op nurse will coordinate an appropriate time for 1 ADULT support person, who may not rotate, to accompany patient in pre-op.  Children under the age of 104 must have an adult with them who is not the patient and must remain in the main waiting area with an adult.  If the patient needs to stay at the hospital during part of their recovery, the visitor guidelines for inpatient rooms apply.  Please refer to the [facility] website for the visitor guidelines for any additional information.   If you received a COVID test during your pre-op visit  it is requested that you wear a mask when out in public, stay away from anyone that may not be feeling well and notify your surgeon if you develop symptoms. If you have been in contact with anyone that has tested positive in the last 10 days please notify you surgeon.      Pre-operative CHG Bathing Instructions   You can play a key role in reducing the risk of infection after surgery. Your skin needs to be as free of germs as possible. You can reduce the number of germs on your skin by washing with CHG (chlorhexidine gluconate) soap before surgery. CHG is an antiseptic soap that kills germs and continues to kill germs even after washing.   DO NOT use if you have an allergy to chlorhexidine/CHG or antibacterial soaps.  If your skin becomes reddened or irritated, stop using the CHG and notify one of our RNs at [phone].              TAKE A SHOWER THE NIGHT BEFORE SURGERY AND THE DAY OF SURGERY    Please keep in mind the following:  DO NOT shave, including legs and underarms, 48 hours prior to surgery.   You may shave your face before/day of surgery.  Place clean sheets on your bed the night before surgery Use a clean washcloth (not used since being washed) for each shower. DO NOT sleep with pet's night  before surgery.  CHG Shower Instructions:  Wash your face and private area with normal soap. If you choose to wash your hair, wash first with your normal shampoo.  After you use shampoo/soap, rinse your hair and body thoroughly to remove shampoo/soap residue.  Turn the water OFF and apply half the bottle of CHG soap to a CLEAN washcloth.  Apply CHG soap ONLY FROM YOUR NECK DOWN TO YOUR TOES (washing for 3-5 minutes)  DO NOT use CHG soap on face, private areas, open wounds, or sores.  Pay special attention to the area where your surgery is being performed.  If you are having back surgery, having someone wash your back for you may be helpful. Wait 2 minutes after CHG soap is applied, then you may rinse off the CHG soap.  Pat dry with a clean towel  Put on clean pajamas    Additional instructions for the day of surgery: DO NOT APPLY any lotions, deodorants, cologne, or perfumes.   Do not wear jewelry or makeup Do not wear nail polish, gel polish, artificial nails, or any other type of covering on natural nails (fingers and toes) Do not bring valuables to the hospital. [facility] is not responsible for valuables/personal belongings. Put on clean/comfortable clothes.  Please brush your teeth.  Ask your nurse before applying any prescription medications to the skin.

## 2024-03-31 ENCOUNTER — Encounter (HOSPITAL_COMMUNITY): Payer: Self-pay

## 2024-03-31 ENCOUNTER — Encounter (HOSPITAL_COMMUNITY)
Admission: RE | Admit: 2024-03-31 | Discharge: 2024-03-31 | Disposition: A | Discharge status: Home / Self Care | Payer: Self-pay | Source: Ambulatory Visit | Attending: General Surgery | Admitting: General Surgery

## 2024-03-31 ENCOUNTER — Other Ambulatory Visit: Payer: Self-pay

## 2024-03-31 VITALS — BP 142/98 | HR 62 | Temp 97.6°F | Resp 17 | Ht 62.0 in | Wt 168.0 lb

## 2024-03-31 DIAGNOSIS — Z01818 Encounter for other preprocedural examination: Secondary | ICD-10-CM

## 2024-03-31 DIAGNOSIS — Z01812 Encounter for preprocedural laboratory examination: Secondary | ICD-10-CM | POA: Insufficient documentation

## 2024-03-31 LAB — BASIC METABOLIC PANEL WITH GFR
Anion gap: 8 (ref 5–15)
BUN: 11 mg/dL (ref 6–20)
CO2: 25 mmol/L (ref 22–32)
Calcium: 9.1 mg/dL (ref 8.9–10.3)
Chloride: 104 mmol/L (ref 98–111)
Creatinine, Ser: 0.56 mg/dL — ABNORMAL LOW (ref 0.61–1.24)
GFR, Estimated: >60 mL/min (ref >60)
Glucose, Bld: 104 mg/dL — ABNORMAL HIGH (ref 70–99)
Potassium: 4.9 mmol/L (ref 3.5–5.1)
Sodium: 137 mmol/L (ref 135–145)

## 2024-03-31 LAB — TYPE AND SCREEN
ABO/RH(D): O POS
Antibody Screen: NEGATIVE

## 2024-03-31 LAB — CBC
HCT: 46.6 % (ref 39.0–52.0)
Hemoglobin: 15.5 g/dL (ref 13.0–17.0)
MCH: 29.6 pg (ref 26.0–34.0)
MCHC: 33.3 g/dL (ref 30.0–36.0)
MCV: 88.9 fL (ref 80.0–100.0)
Platelets: 216 10*3/uL (ref 150–400)
RBC: 5.24 MIL/uL (ref 4.22–5.81)
RDW: 14.3 % (ref 11.5–15.5)
WBC: 5 10*3/uL (ref 4.0–10.5)
nRBC: 0 % (ref 0.0–0.2)

## 2024-03-31 NOTE — Progress Notes (Addendum)
 Instrucciones quirrgicas     Su procedimiento est programado para el martes 22 de abril de 2025. Presntese en  la entrada principal "A" de [place] a las 10:30 a.m., luego regstrese en la oficina de admisiones. Si tiene alguna pregunta o puede llegar tarde al da de la ciruga: llame al [phone]   Si tiene preguntas antes de  la fecha de su ciruga: llame al [phone], de lunes a viernes, de 8 a.m. a 4 p.m. Si experimenta algn sntoma de resfriado o gripe como tos, fiebre, escalofros, dificultad para respirar, etc. Otila Back y su ciruga programada, notifquenos al nmero anterior.            Recordar:       No coma despus de la medianoche de la noche anterior a su ciruga     Puede beber lquidos claros [employer] 9:30 de la [place] de su [place].   Los lquidos claros permitidos son: Westley Hummer, jugos no ctricos (sin pulpa), bebidas carbonatadas, t claro (sin [place], [place], etc.), solo caf negro (sin leche, crema o crema en polvo de ningn tipo) y Gatorade.          Tome estos medicamentos la maana de la ciruga con UN SORBO DE AGUA: Escitalopram (Lexapro)     Puede tomar estos medicamentos SI ES NECESARIO: Paracetamol (Tylenol)      Siga las instrucciones de su cirujano sobre cundo dejar de tomar su aspirina.  Si no recibi instrucciones, comunquese con el consultorio de su cirujano.      Una semana antes de la [place], DEJE de tomar aspirina (a menos que su cirujano le indique lo contrario) Aleve, naproxeno, ibuprofeno, Motrin, Advil, Goody's, BC, todos los medicamentos a base de hierbas, aceite de pescado y vitaminas de [address].                     NO fume (tabaco/vapeo) durante las 24 horas previas a su procedimiento.   Si [place] un CPAP por la noche, puede traer su mascarilla/casco para pasar la noche.   Se le pedir que se quite los lentes de contacto, anteojos, piercings, audfonos, dentaduras postizas / parciales antes de la ciruga. Por favor, traiga  estuches para estos artculos si es necesario.    Los pacientes dados de alta el da de la ciruga no podrn conducir a Higher education careers adviser, y alguien debe quedarse con ellos durante 24 horas.   VISITAS A LA SALA DE ESPERA QUIRRGICA Los pacientes no pueden tener ms de 2 personas de apoyo en la sala de espera, estos visitantes pueden rotar.   La enfermera preoperatoria coordinar un horario apropiado para que 1  persona de apoyo ADULTA, que no puede rotar, acompae al [employer] preoperatoria.  Los nios menores de 16 aos deben tener un adulto con ellos que no sea el paciente y Media planner en la sala de espera principal con un adulto.   Si el paciente Therapist, art hospital durante parte de su recuperacin, se aplican las pautas para visitantes para las habitaciones de pacientes hospitalizados.   Consulte el sitio web de [employer] para conocer las pautas de visita para obtener informacin adicional.     Si recibi una prueba de COVID durante su visita preoperatoria, se le solicita que use una mascarilla cuando est en pblico, se mantenga alejado de cualquier persona que no se sienta bien y notifique a su cirujano si presenta sntomas. Si ha estado en contacto con alguien que haya dado positivo  en los ltimos [address] notifquelo a su cirujano.         Instrucciones de bao preoperatorio de CHG   Usted puede desempear un papel clave en la reduccin del riesgo de infeccin despus de la ciruga. Tu piel debe estar lo ms libre de grmenes posible. Puede reducir la cantidad de grmenes en su piel lavndose con jabn CHG (gluconato de clorhexidina) antes de la ciruga. El CHG es un jabn antisptico que [employer] grmenes y [employer] grmenes incluso despus del lavado.   NO lo use si es alrgico a la clorhexidina/CHG o a los Theatre manager. Si su piel se enrojece o irrita, deje de usar el CHG y notifique a una de nuestras enfermeras registradas al [phone].                DCHATE LA NOCHE ANTES DE LA CIRUGA Y EL DA DE LA CIRUGA     Tenga en cuenta lo siguiente: NO se afeite, incluidas las piernas y [place], 48 horas antes de la [place].   Puede afeitarse la cara antes o el da de la [place]. Coloque sbanas limpias en su cama la noche antes de la ciruga Use un pao limpio (no usado desde que se lav) para cada ducha. NO duerma con la mascota la noche antes de la [place].   Instrucciones de ducha CHG: Lvate la cara y las partes ntimas con jabn normal. Si eliges lavarte el cabello, lvalo primero con tu champ normal. Despus de usar champ/jabn, enjuague bien el cabello y el cuerpo para eliminar los residuos de champ/jabn. Apague el agua  y aplique la mitad de la botella de jabn CHG en un  pao LIMPIO. Aplique el jabn CHG SOLO DESDE EL CUELLO HASTA LOS DEDOS DE LOS PIES (lavando durante 3-5 minutos)  NO use jabn CHG en la cara, reas privadas, heridas abiertas o llagas.  Preste especial atencin al rea donde se est realizando su ciruga. Si se va a someter a [place] de espalda, puede ser til que alguien le lave la espalda. Espere 2 minutos despus de aplicar el jabn CHG, luego puede enjuagar el jabn CHG.  Squelo con una toalla limpia  Ponte un pijama limpio    Instrucciones adicionales para el da de la ciruga: NO APLIQUES lociones, desodorantes, colonias ni perfumes.   No use joyas ni maquillaje No use esmalte de uas, esmalte en gel, uas artificiales o cualquier otro tipo de cobertura en las uas naturales (dedos de las manos y de los pies) No traiga objetos de Licensed conveyancer al hospital. [place] no se hace responsable de los objetos de valor/pertenencias personales. Ponte ropa limpia/cmoda.  Por favor, cepllese los dientes.  Pregntele a su enfermero antes de aplicar cualquier medicamento recetado en la piel.

## 2024-03-31 NOTE — Progress Notes (Signed)
 PCP - No PCP but will use [facility] [phone]) as they are a spanish speaking clinic Cardiologist - Dr. Verne Carrow. Found out pain was from the colon problems and not caused by the heart.  PPM/ICD - Denies Device Orders - n/a Rep Notified - n/a  Chest x-ray - N/A EKG - 09-06-23 Stress Test - Denies ECHO - 07-21-23 Cardiac Cath - 07-23-23  Sleep Study - Denies CPAP - N/A  Non-diabetic  Last dose of GLP1 agonist-  Denies GLP1 instructions: n/a  Blood Thinner Instructions: Denies Aspirin Instructions: Denies  ERAS Protcol - Clears until 9:30 PRE-SURGERY Ensure or G2- none  Patient has a bowel prep from Dr. Janee Morn office explaining only clear liquids the day prior to surgery.  COVID TEST- no   Anesthesia review: No  Patient denies shortness of breath, fever, cough and chest pain at PAT appointment. Patient states no respiratory issues at this time.    All instructions explained to the patient, with a verbal understanding of the material. Patient agrees to go over the instructions while at home for a better understanding. Patient also instructed to self quarantine after being tested for COVID-19. The opportunity to ask questions was provided.

## 2024-04-13 ENCOUNTER — Other Ambulatory Visit: Payer: Self-pay

## 2024-04-13 ENCOUNTER — Encounter (HOSPITAL_COMMUNITY)
Admission: RE | Disposition: A | Discharge status: Home / Self Care | Payer: Self-pay | Source: Home / Self Care | Attending: General Surgery

## 2024-04-13 ENCOUNTER — Inpatient Hospital Stay (HOSPITAL_COMMUNITY)
Admission: RE | Admit: 2024-04-13 | Discharge: 2024-04-18 | DRG: 331 | Disposition: A | Discharge status: Home / Self Care | Payer: MEDICAID | Attending: General Surgery | Admitting: General Surgery

## 2024-04-13 ENCOUNTER — Encounter (HOSPITAL_COMMUNITY): Payer: Self-pay | Admitting: General Surgery

## 2024-04-13 ENCOUNTER — Inpatient Hospital Stay (HOSPITAL_COMMUNITY): Payer: MEDICAID | Admitting: Anesthesiology

## 2024-04-13 DIAGNOSIS — Z833 Family history of diabetes mellitus: Secondary | ICD-10-CM

## 2024-04-13 DIAGNOSIS — Z432 Encounter for attention to ileostomy: Principal | ICD-10-CM

## 2024-04-13 DIAGNOSIS — I10 Essential (primary) hypertension: Secondary | ICD-10-CM | POA: Diagnosis present

## 2024-04-13 DIAGNOSIS — K435 Parastomal hernia without obstruction or  gangrene: Secondary | ICD-10-CM

## 2024-04-13 DIAGNOSIS — Z79899 Other long term (current) drug therapy: Secondary | ICD-10-CM

## 2024-04-13 DIAGNOSIS — K660 Peritoneal adhesions (postprocedural) (postinfection): Secondary | ICD-10-CM | POA: Diagnosis present

## 2024-04-13 DIAGNOSIS — E7800 Pure hypercholesterolemia, unspecified: Secondary | ICD-10-CM | POA: Diagnosis present

## 2024-04-13 DIAGNOSIS — F419 Anxiety disorder, unspecified: Secondary | ICD-10-CM | POA: Diagnosis present

## 2024-04-13 DIAGNOSIS — Z9049 Acquired absence of other specified parts of digestive tract: Secondary | ICD-10-CM

## 2024-04-13 DIAGNOSIS — Z9889 Other specified postprocedural states: Principal | ICD-10-CM | POA: Diagnosis present

## 2024-04-13 HISTORY — PX: PARASTOMAL HERNIA REPAIR: SHX2162

## 2024-04-13 HISTORY — PX: ILEOSTOMY CLOSURE: SHX1784

## 2024-04-13 SURGERY — CLOSURE, ILEOSTOMY
Anesthesia: General

## 2024-04-13 MED ORDER — LIDOCAINE 2% (20 MG/ML) 5 ML SYRINGE
INTRAMUSCULAR | Status: AC
  Filled 2024-04-13: qty 5

## 2024-04-13 MED ORDER — OXYCODONE HCL 5 MG PO TABS
5.0000 mg | ORAL_TABLET | ORAL | Status: DC | PRN
  Administered 2024-04-14 – 2024-04-18 (×5): 10 mg via ORAL
  Filled 2024-04-13 (×5): qty 2

## 2024-04-13 MED ORDER — FENTANYL CITRATE (PF) 250 MCG/5ML IJ SOLN
INTRAMUSCULAR | Status: DC | PRN
  Administered 2024-04-13: 100 ug via INTRAVENOUS
  Administered 2024-04-13 (×2): 50 ug via INTRAVENOUS

## 2024-04-13 MED ORDER — ENOXAPARIN SODIUM 40 MG/0.4ML IJ SOSY
40.0000 mg | PREFILLED_SYRINGE | INTRAMUSCULAR | Status: DC
  Administered 2024-04-14 – 2024-04-18 (×5): 40 mg via SUBCUTANEOUS
  Filled 2024-04-13 (×5): qty 0.4

## 2024-04-13 MED ORDER — METHOCARBAMOL 1000 MG/10ML IJ SOLN
1000.0000 mg | Freq: Three times a day (TID) | INTRAMUSCULAR | Status: DC
  Administered 2024-04-13 – 2024-04-15 (×7): 1000 mg via INTRAVENOUS
  Filled 2024-04-13 (×7): qty 10

## 2024-04-13 MED ORDER — KCL IN DEXTROSE-NACL 20-5-0.45 MEQ/L-%-% IV SOLN
INTRAVENOUS | Status: DC
  Filled 2024-04-13 (×2): qty 1000

## 2024-04-13 MED ORDER — ROCURONIUM BROMIDE 10 MG/ML (PF) SYRINGE
PREFILLED_SYRINGE | INTRAVENOUS | Status: DC | PRN
  Administered 2024-04-13: 10 mg via INTRAVENOUS
  Administered 2024-04-13: 70 mg via INTRAVENOUS

## 2024-04-13 MED ORDER — SUGAMMADEX SODIUM 200 MG/2ML IV SOLN
INTRAVENOUS | Status: DC | PRN
  Administered 2024-04-13: 200 mg via INTRAVENOUS

## 2024-04-13 MED ORDER — OXYCODONE HCL 5 MG/5ML PO SOLN: 5.0000 mg | Freq: Once | ORAL | Status: DC | PRN

## 2024-04-13 MED ORDER — PROPOFOL 10 MG/ML IV BOLUS
INTRAVENOUS | Status: AC
  Filled 2024-04-13: qty 20

## 2024-04-13 MED ORDER — LIDOCAINE 2% (20 MG/ML) 5 ML SYRINGE
INTRAMUSCULAR | Status: DC | PRN
  Administered 2024-04-13: 60 mg via INTRAVENOUS

## 2024-04-13 MED ORDER — CHLORHEXIDINE GLUCONATE 0.12 % MT SOLN
15.0000 mL | Freq: Once | OROMUCOSAL | Status: AC
  Administered 2024-04-13: 15 mL via OROMUCOSAL
  Filled 2024-04-13: qty 15

## 2024-04-13 MED ORDER — FENTANYL CITRATE (PF) 250 MCG/5ML IJ SOLN
INTRAMUSCULAR | Status: AC
  Filled 2024-04-13: qty 5

## 2024-04-13 MED ORDER — GABAPENTIN 300 MG PO CAPS
300.0000 mg | ORAL_CAPSULE | ORAL | Status: AC
  Administered 2024-04-13: 300 mg via ORAL
  Filled 2024-04-13: qty 1

## 2024-04-13 MED ORDER — PROPOFOL 10 MG/ML IV BOLUS
INTRAVENOUS | Status: DC | PRN
  Administered 2024-04-13: 140 mg via INTRAVENOUS

## 2024-04-13 MED ORDER — HYDRALAZINE HCL 20 MG/ML IJ SOLN: 10.0000 mg | INTRAMUSCULAR | Status: DC | PRN

## 2024-04-13 MED ORDER — ONDANSETRON HCL 4 MG/2ML IJ SOLN
INTRAMUSCULAR | Status: AC
  Filled 2024-04-13: qty 2

## 2024-04-13 MED ORDER — ONDANSETRON HCL 4 MG/2ML IJ SOLN
INTRAMUSCULAR | Status: DC | PRN
Start: 2024-04-13 — End: 2024-04-13
  Administered 2024-04-13: 4 mg via INTRAVENOUS

## 2024-04-13 MED ORDER — ONDANSETRON 4 MG PO TBDP: 4.0000 mg | ORAL_TABLET | Freq: Four times a day (QID) | ORAL | Status: DC | PRN

## 2024-04-13 MED ORDER — DEXAMETHASONE SODIUM PHOSPHATE 10 MG/ML IJ SOLN
INTRAMUSCULAR | Status: DC | PRN
  Administered 2024-04-13: 10 mg via INTRAVENOUS

## 2024-04-13 MED ORDER — OXYCODONE HCL 5 MG PO TABS: 5.0000 mg | ORAL_TABLET | Freq: Once | ORAL | Status: DC | PRN

## 2024-04-13 MED ORDER — ONDANSETRON HCL 4 MG/2ML IJ SOLN
4.0000 mg | Freq: Four times a day (QID) | INTRAMUSCULAR | Status: DC | PRN
  Administered 2024-04-13: 4 mg via INTRAVENOUS
  Filled 2024-04-13: qty 2

## 2024-04-13 MED ORDER — HYDROMORPHONE HCL 1 MG/ML IJ SOLN
0.5000 mg | INTRAMUSCULAR | Status: DC | PRN
  Administered 2024-04-13: 0.5 mg via INTRAVENOUS
  Filled 2024-04-13: qty 0.5

## 2024-04-13 MED ORDER — SODIUM CHLORIDE 0.9 % IV SOLN
2.0000 g | INTRAVENOUS | Status: AC
  Administered 2024-04-13: 2 g via INTRAVENOUS
  Filled 2024-04-13: qty 2

## 2024-04-13 MED ORDER — ALBUMIN HUMAN 5 % IV SOLN: INTRAVENOUS | Status: DC | PRN

## 2024-04-13 MED ORDER — LACTATED RINGERS IV SOLN: INTRAVENOUS | Status: DC

## 2024-04-13 MED ORDER — CELECOXIB 200 MG PO CAPS
400.0000 mg | ORAL_CAPSULE | ORAL | Status: AC
  Administered 2024-04-13: 400 mg via ORAL
  Filled 2024-04-13: qty 2

## 2024-04-13 MED ORDER — CHLORHEXIDINE GLUCONATE CLOTH 2 % EX PADS
6.0000 | MEDICATED_PAD | Freq: Once | CUTANEOUS | Status: DC
  Administered 2024-04-13: 6 via TOPICAL

## 2024-04-13 MED ORDER — MIDAZOLAM HCL 2 MG/2ML IJ SOLN
INTRAMUSCULAR | Status: AC
  Filled 2024-04-13: qty 2

## 2024-04-13 MED ORDER — FENTANYL CITRATE (PF) 100 MCG/2ML IJ SOLN
25.0000 ug | INTRAMUSCULAR | Status: DC | PRN
  Administered 2024-04-13 (×2): 50 ug via INTRAVENOUS

## 2024-04-13 MED ORDER — ORAL CARE MOUTH RINSE: 15.0000 mL | Freq: Once | OROMUCOSAL | Status: AC

## 2024-04-13 MED ORDER — 0.9 % SODIUM CHLORIDE (POUR BTL) OPTIME
TOPICAL | Status: DC | PRN
  Administered 2024-04-13: 2000 mL

## 2024-04-13 MED ORDER — FENTANYL CITRATE (PF) 100 MCG/2ML IJ SOLN
INTRAMUSCULAR | Status: AC
  Filled 2024-04-13: qty 2

## 2024-04-13 MED ORDER — ROCURONIUM BROMIDE 10 MG/ML (PF) SYRINGE
PREFILLED_SYRINGE | INTRAVENOUS | Status: AC
  Filled 2024-04-13: qty 10

## 2024-04-13 MED ORDER — MIDAZOLAM HCL 2 MG/2ML IJ SOLN
INTRAMUSCULAR | Status: DC | PRN
  Administered 2024-04-13: 2 mg via INTRAVENOUS

## 2024-04-13 MED ORDER — GABAPENTIN 300 MG PO CAPS
300.0000 mg | ORAL_CAPSULE | Freq: Two times a day (BID) | ORAL | Status: DC
  Administered 2024-04-13 – 2024-04-18 (×10): 300 mg via ORAL
  Filled 2024-04-13 (×10): qty 1

## 2024-04-13 MED ORDER — ACETAMINOPHEN 10 MG/ML IV SOLN: 1000.0000 mg | Freq: Once | INTRAVENOUS | Status: DC | PRN

## 2024-04-13 MED ORDER — ACETAMINOPHEN 500 MG PO TABS
1000.0000 mg | ORAL_TABLET | ORAL | Status: AC
Start: 2024-04-13 — End: 2024-04-13
  Administered 2024-04-13: 1000 mg via ORAL
  Filled 2024-04-13: qty 2

## 2024-04-13 MED ORDER — ESCITALOPRAM OXALATE 10 MG PO TABS: 10.0000 mg | ORAL_TABLET | Freq: Every day | ORAL | Status: DC | PRN

## 2024-04-13 SURGICAL SUPPLY — 52 items
BAG COUNTER SPONGE SURGICOUNT (BAG) ×1 IMPLANT
BLADE CLIPPER SURG (BLADE) IMPLANT
CANISTER SUCT 3000ML PPV (MISCELLANEOUS) ×1 IMPLANT
CHLORAPREP W/TINT 26 (MISCELLANEOUS) ×1 IMPLANT
COVER SURGICAL LIGHT HANDLE (MISCELLANEOUS) ×1 IMPLANT
DEVICE SECURE STRAP 25 ABSORB (INSTRUMENTS) IMPLANT
DRAPE LAPAROSCOPIC ABDOMINAL (DRAPES) ×1 IMPLANT
DRAPE UTILITY XL STRL (DRAPES) ×1 IMPLANT
DRAPE WARM FLUID 44X44 (DRAPES) ×1 IMPLANT
DRSG OPSITE POSTOP 4X10 (GAUZE/BANDAGES/DRESSINGS) IMPLANT
DRSG OPSITE POSTOP 4X6 (GAUZE/BANDAGES/DRESSINGS) IMPLANT
DRSG OPSITE POSTOP 4X8 (GAUZE/BANDAGES/DRESSINGS) IMPLANT
ELECT BLADE 6.5 EXT (BLADE) ×1 IMPLANT
ELECT CAUTERY BLADE 6.4 (BLADE) ×1 IMPLANT
ELECTRODE BLDE 4.0 EZ CLN MEGD (MISCELLANEOUS) IMPLANT
ELECTRODE REM PT RTRN 9FT ADLT (ELECTROSURGICAL) ×1 IMPLANT
GAUZE SPONGE 4X4 12PLY STRL (GAUZE/BANDAGES/DRESSINGS) ×1 IMPLANT
GLOVE BIO SURGEON STRL SZ8 (GLOVE) ×2 IMPLANT
GLOVE BIOGEL PI IND STRL 8 (GLOVE) ×1 IMPLANT
GOWN STRL REUS W/ TWL LRG LVL3 (GOWN DISPOSABLE) ×2 IMPLANT
GOWN STRL REUS W/ TWL XL LVL3 (GOWN DISPOSABLE) ×1 IMPLANT
HANDLE SUCTION POOLE (INSTRUMENTS) ×1 IMPLANT
KIT BASIN OR (CUSTOM PROCEDURE TRAY) ×1 IMPLANT
KIT TURNOVER KIT B (KITS) ×1 IMPLANT
LIGASURE IMPACT 36 18CM CVD LR (INSTRUMENTS) IMPLANT
MARKER PEN SURG W/LABELS BLK (STERILIZATION PRODUCTS) IMPLANT
MESH OVITEX 1S PERM 6X10 6L (Mesh General) IMPLANT
NS IRRIG 1000ML POUR BTL (IV SOLUTION) ×2 IMPLANT
PACK COLON (CUSTOM PROCEDURE TRAY) IMPLANT
PACK GENERAL/GYN (CUSTOM PROCEDURE TRAY) ×1 IMPLANT
PAD ARMBOARD POSITIONER FOAM (MISCELLANEOUS) ×1 IMPLANT
PENCIL SMOKE EVACUATOR (MISCELLANEOUS) ×1 IMPLANT
RELOAD PROXIMATE 75MM BLUE (ENDOMECHANICALS) ×1 IMPLANT
RELOAD STAPLE 75 3.8 BLU REG (ENDOMECHANICALS) IMPLANT
SPECIMEN JAR MEDIUM (MISCELLANEOUS) IMPLANT
SPONGE T-LAP 18X18 ~~LOC~~+RFID (SPONGE) IMPLANT
STAPLER GUN LINEAR PROX 60 (STAPLE) IMPLANT
STAPLER PROXIMATE 75MM BLUE (STAPLE) IMPLANT
STAPLER SKIN PROX 35W (STAPLE) IMPLANT
STAPLER VISISTAT 35W (STAPLE) ×1 IMPLANT
SUT PDS AB 0 CT 36 (SUTURE) IMPLANT
SUT PDS AB 1 TP1 54 (SUTURE) IMPLANT
SUT PDS AB 1 TP1 96 (SUTURE) ×2 IMPLANT
SUT SILK 2 0 SH CR/8 (SUTURE) ×1 IMPLANT
SUT SILK 2-0 18XBRD TIE 12 (SUTURE) ×1 IMPLANT
SUT SILK 3 0 SH CR/8 (SUTURE) ×1 IMPLANT
SUT SILK 3-0 18XBRD TIE 12 (SUTURE) ×1 IMPLANT
SUT VIC AB 3-0 SH 27X BRD (SUTURE) IMPLANT
TOWEL GREEN STERILE (TOWEL DISPOSABLE) ×1 IMPLANT
TOWEL GREEN STERILE FF (TOWEL DISPOSABLE) ×1 IMPLANT
TRAY FOLEY MTR SLVR 14FR STAT (SET/KITS/TRAYS/PACK) ×1 IMPLANT
YANKAUER SUCT BULB TIP NO VENT (SUCTIONS) ×2 IMPLANT

## 2024-04-13 NOTE — Transfer of Care (Signed)
 Immediate Anesthesia Transfer of Care Note  Patient: Carl Briggs  Procedure(s) Performed: CLOSURE, ILEOSTOMY REPAIR, HERNIA, PARASTOMAL  Patient Location: PACU  Anesthesia Type:General  Level of Consciousness: drowsy  Airway & Oxygen Therapy: Patient Spontanous Breathing and Patient connected to face mask oxygen  Post-op Assessment: Report given to RN and Post -op Vital signs reviewed and stable  Post vital signs: Reviewed and stable  Last Vitals:  Vitals Value Taken Time  BP 138/83 04/13/24 1500  Temp 36.5 C 04/13/24 1500  Pulse 82 04/13/24 1502  Resp 15 04/13/24 1502  SpO2 97 % 04/13/24 1502  Vitals shown include unfiled device data.  Last Pain:  Vitals:   04/13/24 1500  TempSrc:   PainSc: 0-No pain      Patients Stated Pain Goal: 0 (04/13/24 1033)  Complications: No notable events documented.

## 2024-04-13 NOTE — H&P (Signed)
 Carl Briggs is an 53 y.o. male.   Chief Complaint: ileostomy in place HPI: Presents for ileostomy takedown and repair peristomal hernia. Tolerated prep well.  Past Medical History:  Diagnosis Date   Anxiety    GI bleed 11/07/2018   High cholesterol    Hypertension     Past Surgical History:  Procedure Laterality Date   COLECTOMY N/A 07/25/2023   Procedure: OPEN  RIGHT COLECTOMY;  Surgeon: Dorena Gander, MD;  Location: [facility] OR;  Service: General;  Laterality: N/A;   COLONOSCOPY WITH PROPOFOL  N/A 11/09/2018   Procedure: COLONOSCOPY WITH PROPOFOL ;  Surgeon: Evangeline Hilts, MD;  Location: [facility] ENDOSCOPY;  Service: Endoscopy;  Laterality: N/A;   LEFT HEART CATH AND CORONARY ANGIOGRAPHY N/A 07/23/2023   Procedure: LEFT HEART CATH AND CORONARY ANGIOGRAPHY;  Surgeon: Odie Benne, MD;  Location: [facility] INVASIVE CV LAB;  Service: Cardiovascular;  Laterality: N/A;    Family History  Problem Relation Age of Onset   Diabetes Mother    Social History:  reports that he has never smoked. He has never used smokeless tobacco. He reports that he does not currently use alcohol. He reports that he does not currently use drugs.  Allergies: No Known Allergies  Medications Prior to Admission  Medication Sig Dispense Refill   escitalopram  (LEXAPRO ) 10 MG tablet Take 1 tablet (10 mg total) by mouth daily. (Patient taking differently: Take 10 mg by mouth daily as needed (anxiety).) 30 tablet 2   acetaminophen  (TYLENOL ) 500 MG tablet Take 2 tablets (1,000 mg total) by mouth every 6 (six) hours as needed for mild pain, moderate pain, fever or headache. (Patient not taking: Reported on 03/31/2024)     aspirin  EC 81 MG tablet Take 1 tablet (81 mg total) by mouth daily. Swallow whole. (Patient not taking: Reported on 03/31/2024) 90 tablet 2   dicyclomine  (BENTYL ) 20 MG tablet Take 1 tablet (20 mg total) by mouth 4 (four) times daily -  before meals and at bedtime for 5 days. (Patient not taking:  Reported on 03/31/2024) 20 tablet 0   docusate sodium  (COLACE) 100 MG capsule Take 1 capsule (100 mg total) by mouth 2 (two) times daily. (Patient not taking: Reported on 03/31/2024) 10 capsule 0   fiber (NUTRISOURCE FIBER) PACK packet Take 1 packet by mouth 2 (two) times daily. (Patient not taking: Reported on 03/31/2024)     ibuprofen  (ADVIL ) 200 MG tablet Take 400 mg by mouth every 6 (six) hours as needed for headache or moderate pain. (Patient not taking: Reported on 03/31/2024)     oxyCODONE  (OXY IR/ROXICODONE ) 5 MG immediate release tablet Take 1 tablet (5 mg total) by mouth every 6 (six) hours as needed for severe pain. (Patient not taking: Reported on 03/31/2024) 15 tablet 0   pantoprazole  (PROTONIX ) 20 MG tablet TAKE 1 TABLET(20 MG) BY MOUTH DAILY (Patient not taking: Reported on 03/31/2024) 30 tablet 2   Sodium Bicarbonate -Citric Acid (ALKA-SELTZER PO) Take 2 tablets by mouth 2 (two) times daily as needed (stomach upset). (Patient not taking: Reported on 03/31/2024)      No results found for this or any previous visit (from the past 48 hours). No results found.  Review of Systems  Blood pressure (!) 146/94, pulse 67, temperature 97.6 F (36.4 C), temperature source Oral, resp. rate 17, height 5\' 2"  (1.575 m), weight 76.2 kg, SpO2 99%. Physical Exam Eyes:     Pupils: Pupils are equal, round, and reactive to light.  Cardiovascular:     Rate  and Rhythm: Normal rate and regular rhythm.  Pulmonary:     Effort: Pulmonary effort is normal. No respiratory distress.     Breath sounds: No wheezing.  Abdominal:     General: Abdomen is flat.     Palpations: Abdomen is soft.     Comments: Ileostomy R side, peristomal hernia, soft, NT  Musculoskeletal:        General: No swelling.  Skin:    General: Skin is warm.     Capillary Refill: Capillary refill takes 2 to 3 seconds.  Neurological:     Mental Status: He is alert and oriented to person, place, and time.      Assessment/Plan Ileostomy in  place, peristomal hernia - for ileostomy takedown and repair peristomal hernia. Procedure, risks, benefits, were again discussed. I also discussed the expected post-op course with him and his daughter. Interpreter at bedside as well. He agrees.  Cloyce Darby, MD 04/13/2024, 12:11 PM

## 2024-04-13 NOTE — Anesthesia Preprocedure Evaluation (Signed)
 Anesthesia Evaluation  Patient identified by MRN, date of birth, ID band Patient awake    Reviewed: Allergy & Precautions, NPO status , Patient's Chart, lab work & pertinent test results  History of Anesthesia Complications Negative for: history of anesthetic complications  Airway Mallampati: III  TM Distance: >3 FB Neck ROM: Full    Dental  (+) Teeth Intact, Dental Advisory Given   Pulmonary neg shortness of breath, neg COPD, neg recent URI   breath sounds clear to auscultation       Cardiovascular hypertension, Pt. on medications  Rhythm:Regular     Neuro/Psych  PSYCHIATRIC DISORDERS Anxiety     negative neurological ROS     GI/Hepatic Neg liver ROS,GERD  ,, Parastomal hernia without obstruction or gangrene [K43.5]      Colostomy present     Endo/Other  negative endocrine ROS    Renal/GU negative Renal ROS     Musculoskeletal negative musculoskeletal ROS (+)    Abdominal   Peds  Hematology negative hematology ROS (+)   Anesthesia Other Findings   Reproductive/Obstetrics                             Anesthesia Physical Anesthesia Plan  ASA: 2  Anesthesia Plan: General   Post-op Pain Management: Tylenol  PO (pre-op)*, Celebrex  PO (pre-op)* and Gabapentin  PO (pre-op)*   Induction: Intravenous  PONV Risk Score and Plan: 3 and Ondansetron  and Dexamethasone   Airway Management Planned: Oral ETT  Additional Equipment: None  Intra-op Plan:   Post-operative Plan: Extubation in OR  Informed Consent: I have reviewed the patients History and Physical, chart, labs and discussed the procedure including the risks, benefits and alternatives for the proposed anesthesia with the patient or authorized representative who has indicated his/her understanding and acceptance.     Dental advisory given and Interpreter used for interview  Plan Discussed with: CRNA  Anesthesia Plan Comments:         Anesthesia Quick Evaluation

## 2024-04-13 NOTE — Anesthesia Procedure Notes (Signed)
 Procedure Name: Intubation Date/Time: 04/13/2024 12:32 PM  Performed by: Laroy Plunk, CRNAPre-anesthesia Checklist: Patient identified, Emergency Drugs available, Suction available and Patient being monitored Patient Re-evaluated:Patient Re-evaluated prior to induction Oxygen Delivery Method: Circle System Utilized Preoxygenation: Pre-oxygenation with 100% oxygen Induction Type: IV induction Ventilation: Mask ventilation without difficulty Laryngoscope Size: Mac and 3 Grade View: Grade I Tube type: Oral Tube size: 8.0 mm Number of attempts: 1 Airway Equipment and Method: Stylet Placement Confirmation: ETT inserted through vocal cords under direct vision, positive ETCO2 and breath sounds checked- equal and bilateral Secured at: 23 cm Tube secured with: Tape Dental Injury: Teeth and Oropharynx as per pre-operative assessment

## 2024-04-13 NOTE — Op Note (Signed)
 04/13/2024  3:09 PM  PATIENT:  Carl Briggs  53 y.o. male  PRE-OPERATIVE DIAGNOSIS:  ILEOSTOMY, PARASTOMA HERNIA  POST-OPERATIVE DIAGNOSIS:  ILEOSTOMY, PARASTOMA HERNIA  PROCEDURE:  Procedure(s): LYSIS OF ADHESIONS CLOSURE, ILEOSTOMY REPAIR, HERNIA, PARASTOMAL (HERNIA SIXE 3cm X 2CM) USING OVITEX 6X10CM   SURGEON:  Dorena Gander, MD  ASSISTANTS: Kyle Pho, MD   ANESTHESIA:   general  EBL:  Total I/O In: 1850 [I.V.:1500; IV Piggyback:350] Out: 250 [Urine:200; Blood:50]  BLOOD ADMINISTERED:none  DRAINS: none   SPECIMEN:  Excision  DISPOSITION OF SPECIMEN:  PATHOLOGY  COUNTS:  YES  DICTATION: .Dragon Dictation Procedure detail: informed consent was obtained.  He received intravenous antibiotics.  He was brought to the operating room and general endotracheal anesthesia was administered by the anesthesia staff.  Foley catheter was placed by nursing.  We did a timeout procedure and I closed his ileostomy with running 2-0 silk.  His abdomen was prepped and draped in a sterile fashion.  We did another timeout procedure.  I made a midline incision from above his old scar and carefully dissected down to the anterior fascia.  The fascia was divided and the peritoneal cavity was entered carefully under direct vision.  I gradually freed up omental adhesions from underneath the midline and open the midline incision down below the umbilicus.  Further adhesiolysis was done.  Total of 30 minutes.  This was able to free all of the omentum from the underside of the midline as well as some small bowel loops.  Further adhesiolysis was done around the ileostomy.  Next, a elliptical incision was made around the ileostomy through the skin and subcutaneous tissues were dissected around it.  It was gradually freed up from the muscle carefully and delivered into the abdominal cavity.  There was a lot of scar tissue in this area that was gradually taken down carefully.  Next a few more  filmy adhesions were taken down freeing up along length of ileum which was nice and viable.  The remaining ascending colon was identified.  It was freed up from multiple adhesions and I removed some of the omentum from the very end of it using the LigaSure.  Attention was turned back to the ileostomy in the distal 8 cm including the ileostomy of small bowel was removed using GIA 75 stapler.  The end was nice and viable.  Next, we did a side-to-side anastomosis between the distal ileum and the remaining colon using a GIA 75 stapler.  There was a small bleeder on the edge of the enterotomy which was controlled with 3-0 silk sutures.  The anastomosis was light widely patent.  The common defect was closed with a TA 60 stapler.  A few additional sutures were placed both in the the apical area and along the staple line for both support and hemostasis.  The anastomosis was pink and viable.  It was patent.  The area was irrigated hemostasis was ensured.  I then reinspected the parastomal hernia at the ileostomy site.  The hernia defect in the muscle was 3 cm x 2 cm.  I closed this primarily with running #1 PDS.  I then placed a piece of overtax mesh over the top to reinforce the repair and this was secured with secure straps.  This was a 6 x 10 cm mesh which gave excellent overlay.  The abdomen was copiously irrigated.  The bowel was reinspected.  Anastomosis was pink and viable.  Some omentum was placed over the anastomosis.  The remainder the bowel was placed in anatomic position.  We then changed our gown, gloves, instruments, and drapes in accordance with the colon protocol.  The midline fascia was closed with running #1 looped PDS.  The middle portion of his wound had a lot of dense scar tissue and we attempted to get the best bites of fascia possible in light of this chronic scarring.  It came together nicely.  The skin was irrigated.  The ileostomy site in the midline were closed with staples.  Sterile dressings were  applied.  All counts were correct.  He tolerated the procedure well without apparent complication was taken recovery in stable condition. PATIENT DISPOSITION:  PACU - hemodynamically stable.   Delay start of Pharmacological VTE agent (>24hrs) due to surgical blood loss or risk of bleeding:  no  Dorena Gander, MD, MPH, FACS Pager: [phone]  4/22/20253:09 PM

## 2024-04-14 ENCOUNTER — Encounter (HOSPITAL_COMMUNITY): Payer: Self-pay | Admitting: General Surgery

## 2024-04-14 LAB — BASIC METABOLIC PANEL WITH GFR
Anion gap: 7 (ref 5–15)
BUN: 8 mg/dL (ref 6–20)
CO2: 23 mmol/L (ref 22–32)
Calcium: 7.9 mg/dL — ABNORMAL LOW (ref 8.9–10.3)
Chloride: 105 mmol/L (ref 98–111)
Creatinine, Ser: 0.61 mg/dL (ref 0.61–1.24)
GFR, Estimated: >60 mL/min (ref >60)
Glucose, Bld: 147 mg/dL — ABNORMAL HIGH (ref 70–99)
Potassium: 3.9 mmol/L (ref 3.5–5.1)
Sodium: 135 mmol/L (ref 135–145)

## 2024-04-14 LAB — CBC
HCT: 38.6 % — ABNORMAL LOW (ref 39.0–52.0)
Hemoglobin: 13.4 g/dL (ref 13.0–17.0)
MCH: 30.2 pg (ref 26.0–34.0)
MCHC: 34.7 g/dL (ref 30.0–36.0)
MCV: 86.9 fL (ref 80.0–100.0)
Platelets: 185 10*3/uL (ref 150–400)
RBC: 4.44 MIL/uL (ref 4.22–5.81)
RDW: 13.5 % (ref 11.5–15.5)
WBC: 12.7 10*3/uL — ABNORMAL HIGH (ref 4.0–10.5)
nRBC: 0 % (ref 0.0–0.2)

## 2024-04-14 LAB — SURGICAL PATHOLOGY

## 2024-04-14 MED ORDER — POTASSIUM CHLORIDE 2 MEQ/ML IV SOLN: INTRAVENOUS | Status: DC

## 2024-04-14 MED ORDER — HYDROMORPHONE HCL 1 MG/ML IJ SOLN
1.0000 mg | INTRAMUSCULAR | Status: DC | PRN
  Administered 2024-04-15 – 2024-04-16 (×2): 1 mg via INTRAVENOUS
  Filled 2024-04-14 (×2): qty 1

## 2024-04-14 MED ORDER — KCL IN DEXTROSE-NACL 20-5-0.9 MEQ/L-%-% IV SOLN
INTRAVENOUS | Status: AC
  Filled 2024-04-14 (×3): qty 1000

## 2024-04-14 NOTE — Anesthesia Postprocedure Evaluation (Signed)
 Anesthesia Post Note  Patient: Carl Briggs  Procedure(s) Performed: CLOSURE, ILEOSTOMY REPAIR, HERNIA, PARASTOMAL     Patient location during evaluation: PACU Anesthesia Type: General Level of consciousness: awake and alert Pain management: pain level controlled Vital Signs Assessment: post-procedure vital signs reviewed and stable Respiratory status: spontaneous breathing, nonlabored ventilation and respiratory function stable Cardiovascular status: blood pressure returned to baseline and stable Postop Assessment: no apparent nausea or vomiting Anesthetic complications: no   No notable events documented.                 Shanikia Kernodle

## 2024-04-14 NOTE — TOC Initial Note (Addendum)
 Transition of Care (TOC) - Initial/Assessment Note   Spoke to patient via [employer] [place] [id number] . Patient from home with wife, no DME.   Patient listed as no PCP. Patient reports PCP is DR Lanney Pitts at [facility] . Updated chart.   Patient does not have any insurance, consulted to a Artist consult, same done. His account was assigned to [employer]. They will be able to screen him for medicaid.   Patient agreeable to use [facility] at discharge. Prescriptions will be sent to [facility], [facility] will call patient with cost and deliver medicines to patient prior to discharge .    Transition of Care Department  will continue to monitor patient advancement through interdisciplinary progression rounds. If new patient transition needs arise, please place a TOC consult.   Patient Details  Name: Carl Briggs MRN: [id number] Date of Birth: October 30, 1971  Transition of Care [facility]) CM/SW Contact:    Terre Ferri, RN Phone Number: 04/14/2024, 11:23 AM  Clinical Narrative:                   Expected Discharge Plan: Home/Self Care Barriers to Discharge: Continued Medical Work up   Patient Goals and CMS Choice Patient states their goals for this hospitalization and ongoing recovery are:: to return to home   Choice offered to / list presented to : NA      Expected Discharge Plan and Services In-house Referral: Financial Counselor Discharge Planning Services: CM Consult Post Acute Care Choice: NA Living arrangements for the past 2 months: Mobile Home                 DME Arranged: N/A DME Agency: NA       HH Arranged: NA HH Agency: NA        Prior Living Arrangements/Services Living arrangements for the past 2 months: Mobile Home Lives with:: Spouse Patient language and need for interpreter reviewed:: Yes Do you feel safe going back to the place where you live?: Yes      Need for Family  Participation in Patient Care: Yes (Comment) Care giver support system in place?: Yes (comment)   Criminal Activity/Legal Involvement Pertinent to Current Situation/Hospitalization: No - Comment as needed  Activities of Daily Living   ADL Screening (condition at time of admission) Independently performs ADLs?: Yes (appropriate for developmental age) Is the patient deaf or have difficulty hearing?: No Does the patient have difficulty seeing, even when wearing glasses/contacts?: No Does the patient have difficulty concentrating, remembering, or making decisions?: No  Permission Sought/Granted   Permission granted to share information with : No              Emotional Assessment Appearance:: Appears stated age Attitude/Demeanor/Rapport: Engaged Affect (typically observed): Appropriate Orientation: : Oriented to Self, Oriented to Place, Oriented to  Time, Oriented to Situation Alcohol / Substance Use: Not Applicable Psych Involvement: No (comment)  Admission diagnosis:  Parastomal hernia without obstruction or gangrene [K43.5] 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] S/P closure of ileostomy [Z98.890] Patient Active Problem List   Diagnosis Date Noted   S/P closure of ileostomy 04/13/2024   Prediabetes 09/26/2023   Acid reflux 09/26/2023   Anxiety 09/26/2023   Postoperative infection 09/06/2023   Irritant contact dermatitis associated with fecal stoma 08/22/2023   Ileostomy care (HCC) 08/22/2023   Elevated AST (SGOT) 08/11/2021   PCP:  Patient, No Pcp Per Pharmacy:   [id number] Jonette Nestle,  [place] - [address] AT [facility] [address] [place] [address] Phone: [phone] Fax: [phone]  [facility] [address] [place] [address] Phone: [phone] Fax: [phone]  [id number] - [place], [place] - [address] AT [facility] [address] [facility] [address] [place] [address] Phone: [phone] Fax: [phone]     Social Drivers of Health (SDOH) Social History: SDOH Screenings   Food Insecurity: No Food Insecurity (04/13/2024)  Housing: Low Risk  (04/13/2024)  Transportation Needs: No Transportation Needs (04/13/2024)  Utilities: Not At Risk (04/13/2024)  Depression (PHQ2-9): Low Risk  (09/26/2023)  Tobacco Use: Low Risk  (04/13/2024)  Recent Concern: Tobacco Use - Medium Risk (03/03/2024)   Received from [facility]   SDOH Interventions:     Readmission Risk Interventions     No data to display

## 2024-04-14 NOTE — Progress Notes (Signed)
 Patient ID: Carl Briggs, male   DOB: January 21, 1971, 53 y.o.   MRN: [id number] 1 Day Post-Op    Subjective: No nausea, no flatus, a lot of MM pain on R ROS negative except as listed above. Objective: Vital signs in last 24 hours: Temp:  [97.6 F (36.4 C)-98.4 F (36.9 C)] 97.9 F (36.6 C) (04/23 0507) Pulse Rate:  [67-98] 85 (04/23 0507) Resp:  [12-27] 18 (04/23 0507) BP: (115-146)/(72-94) 119/78 (04/23 0507) SpO2:  [92 %-99 %] 95 % (04/23 0507) Weight:  [76.2 kg] 76.2 kg (04/22 1030) Last BM Date : 04/11/24  Intake/Output from previous day: 04/22 0701 - 04/23 0700 In: 2756.5 [P.O.:10; I.V.:2396.5; IV Piggyback:350] Out: 1550 [Urine:1500; Blood:50] Intake/Output this shift: No intake/output data recorded.  General appearance: alert and cooperative Resp: clear to auscultation bilaterally GI: soft, dressings dry stain  Lab Results: CBC  Recent Labs    04/14/24 0633  WBC 12.7*  HGB 13.4  HCT 38.6*  PLT 185   BMET Recent Labs    04/14/24 0633  NA 135  K 3.9  CL 105  CO2 23  GLUCOSE 147*  BUN 8  CREATININE 0.61  CALCIUM 7.9*   PT/INR No results for input(s): "LABPROT", "INR" in the last 72 hours. ABG No results for input(s): "PHART", "HCO3" in the last 72 hours.  Invalid input(s): "PCO2", "PO2"  Studies/Results: No results found.  Anti-infectives: Anti-infectives (From admission, onward)    Start     Dose/Rate Route Frequency Ordered Stop   04/13/24 1030  cefoTEtan  (CEFOTAN ) 2 g in sodium chloride  0.9 % 100 mL IVPB        2 g 200 mL/hr over 30 Minutes Intravenous On call to O.R. 04/13/24 1025 04/13/24 1304       Assessment/Plan: S/P LOA, ileostomy takedown and repair peristomal hernia 4/22 - clears, AROBF - binder - increase breakthrough dilaudid  - Continue IVF Foley - D/C VTE - LMWH Dispo - med surg, AROBF  LOS: 1 day    Dorena Gander, MD, MPH, FACS Trauma & General Surgery Use [email or web address] to contact on call  provider  04/14/2024

## 2024-04-15 LAB — CBC
HCT: 40.5 % (ref 39.0–52.0)
Hemoglobin: 13.5 g/dL (ref 13.0–17.0)
MCH: 29.5 pg (ref 26.0–34.0)
MCHC: 33.3 g/dL (ref 30.0–36.0)
MCV: 88.4 fL (ref 80.0–100.0)
Platelets: 190 10*3/uL (ref 150–400)
RBC: 4.58 MIL/uL (ref 4.22–5.81)
RDW: 14.1 % (ref 11.5–15.5)
WBC: 7.8 10*3/uL (ref 4.0–10.5)
nRBC: 0 % (ref 0.0–0.2)

## 2024-04-15 LAB — BASIC METABOLIC PANEL WITH GFR
Anion gap: 8 (ref 5–15)
BUN: 7 mg/dL (ref 6–20)
CO2: 23 mmol/L (ref 22–32)
Calcium: 8.1 mg/dL — ABNORMAL LOW (ref 8.9–10.3)
Chloride: 107 mmol/L (ref 98–111)
Creatinine, Ser: 0.7 mg/dL (ref 0.61–1.24)
GFR, Estimated: >60 mL/min (ref >60)
Glucose, Bld: 129 mg/dL — ABNORMAL HIGH (ref 70–99)
Potassium: 3.9 mmol/L (ref 3.5–5.1)
Sodium: 138 mmol/L (ref 135–145)

## 2024-04-15 MED ORDER — METHOCARBAMOL 500 MG PO TABS
1000.0000 mg | ORAL_TABLET | Freq: Three times a day (TID) | ORAL | Status: DC
  Administered 2024-04-16 – 2024-04-18 (×7): 1000 mg via ORAL
  Filled 2024-04-15 (×7): qty 2

## 2024-04-15 NOTE — Progress Notes (Signed)
 2 Days Post-Op   Subjective/Chief Complaint: Tolerating clears BM today Walked with staff Better pain control   Objective: Vital signs in last 24 hours: Temp:  [98.1 F (36.7 C)-99.2 F (37.3 C)] 99.2 F (37.3 C) (04/24 0935) Pulse Rate:  [73-84] 83 (04/24 0935) Resp:  [17-18] 17 (04/24 0935) BP: (116-130)/(78-85) 130/85 (04/24 0935) SpO2:  [95 %-96 %] 95 % (04/24 0935) Last BM Date : 04/15/24 (pt stated BM was very small)  Intake/Output from previous day: 04/23 0701 - 04/24 0700 In: 0  Out: 1550 [Urine:1550] Intake/Output this shift: Total I/O In: 120 [P.O.:120] Out: -   Abd soft, dry stain on dressing  Lab Results:  Recent Labs    04/14/24 0633 04/15/24 0628  WBC 12.7* 7.8  HGB 13.4 13.5  HCT 38.6* 40.5  PLT 185 190   BMET Recent Labs    04/14/24 0633 04/15/24 0628  NA 135 138  K 3.9 3.9  CL 105 107  CO2 23 23  GLUCOSE 147* 129*  BUN 8 7  CREATININE 0.61 0.70  CALCIUM 7.9* 8.1*   PT/INR No results for input(s): "LABPROT", "INR" in the last 72 hours. ABG No results for input(s): "PHART", "HCO3" in the last 72 hours.  Invalid input(s): "PCO2", "PO2"  Studies/Results: No results found.  Anti-infectives: Anti-infectives (From admission, onward)    Start     Dose/Rate Route Frequency Ordered Stop   04/13/24 1030  cefoTEtan  (CEFOTAN ) 2 g in sodium chloride  0.9 % 100 mL IVPB        2 g 200 mL/hr over 30 Minutes Intravenous On call to O.R. 04/13/24 1025 04/13/24 1304       Assessment/Plan: S/P LOA, ileostomy takedown and repair peristomal hernia 4/22 - fulls now - binder Foley - D/C VTE - LMWH Dispo - med surg, fulls now, possible reg diet in AM, ambulate  LOS: 2 days    Cloyce Darby 04/15/2024

## 2024-04-16 NOTE — Progress Notes (Signed)
 Patient ID: Dantae Meunier, male   DOB: 1971/06/26, 53 y.o.   MRN: [id number] 3 Days Post-Op    Subjective: Tolerated fulls ROS negative except as listed above. Objective: Vital signs in last 24 hours: Temp:  [97.8 F (36.6 C)-99.2 F (37.3 C)] 97.8 F (36.6 C) (04/25 0808) Pulse Rate:  [83-99] 88 (04/25 0808) Resp:  [16-18] 16 (04/25 0808) BP: (108-131)/(76-88) 108/86 (04/25 0808) SpO2:  [94 %-97 %] 94 % (04/25 0808) Last BM Date : 04/15/24 (pt stated BM was very small)  Intake/Output from previous day: 04/24 0701 - 04/25 0700 In: 120 [P.O.:120] Out: -  Intake/Output this shift: No intake/output data recorded.  GI: soft, dry stain on dressings, expected soreness  Lab Results: CBC  Recent Labs    04/14/24 0633 04/15/24 0628  WBC 12.7* 7.8  HGB 13.4 13.5  HCT 38.6* 40.5  PLT 185 190   BMET Recent Labs    04/14/24 0633 04/15/24 0628  NA 135 138  K 3.9 3.9  CL 105 107  CO2 23 23  GLUCOSE 147* 129*  BUN 8 7  CREATININE 0.61 0.70  CALCIUM 7.9* 8.1*   PT/INR No results for input(s): "LABPROT", "INR" in the last 72 hours. ABG No results for input(s): "PHART", "HCO3" in the last 72 hours.  Invalid input(s): "PCO2", "PO2"  Studies/Results: No results found.  Anti-infectives: Anti-infectives (From admission, onward)    Start     Dose/Rate Route Frequency Ordered Stop   04/13/24 1030  cefoTEtan  (CEFOTAN ) 2 g in sodium chloride  0.9 % 100 mL IVPB        2 g 200 mL/hr over 30 Minutes Intravenous On call to O.R. 04/13/24 1025 04/13/24 1304       Assessment/Plan: S/P LOA, ileostomy takedown and repair peristomal hernia 4/22 - try reg diet - binder  VTE - LMWH Dispo - med surg, possible D/C tomorrow if tolerates diet and pain controlled  LOS: 3 days    Dorena Gander, MD, MPH, FACS Trauma & General Surgery Use [email or web address] to contact on call provider  04/16/2024

## 2024-04-17 NOTE — Plan of Care (Signed)

## 2024-04-17 NOTE — Plan of Care (Signed)

## 2024-04-17 NOTE — Progress Notes (Signed)
 Patient ID: Carl Briggs, male   DOB: 1971-04-04, 53 y.o.   MRN: [id number] 4 Days Post-Op    Subjective: Tolerating reg. No further BM ROS negative except as listed above. Objective: Vital signs in last 24 hours: Temp:  [97.8 F (36.6 C)-98.7 F (37.1 C)] 97.9 F (36.6 C) (04/26 0740) Pulse Rate:  [88-105] 91 (04/26 0740) Resp:  [16-18] 17 (04/26 0740) BP: (108-132)/(81-88) 119/81 (04/26 0740) SpO2:  [94 %-97 %] 96 % (04/26 0740) Last BM Date : 04/15/24  Intake/Output from previous day: 04/25 0701 - 04/26 0700 In: 320 [P.O.:320] Out: -  Intake/Output this shift: No intake/output data recorded.  GI: soft, dressings removed, edge of skin slough at central scarred areas , no cellulitis, gauze placed  Lab Results: CBC  Recent Labs    04/15/24 0628  WBC 7.8  HGB 13.5  HCT 40.5  PLT 190   BMET Recent Labs    04/15/24 0628  NA 138  K 3.9  CL 107  CO2 23  GLUCOSE 129*  BUN 7  CREATININE 0.70  CALCIUM 8.1*   PT/INR No results for input(s): "LABPROT", "INR" in the last 72 hours. ABG No results for input(s): "PHART", "HCO3" in the last 72 hours.  Invalid input(s): "PCO2", "PO2"  Studies/Results: No results found.  Anti-infectives: Anti-infectives (From admission, onward)    Start     Dose/Rate Route Frequency Ordered Stop   04/13/24 1030  cefoTEtan  (CEFOTAN ) 2 g in sodium chloride  0.9 % 100 mL IVPB        2 g 200 mL/hr over 30 Minutes Intravenous On call to O.R. 04/13/24 1025 04/13/24 1304       Assessment/Plan: S/P LOA, ileostomy takedown and repair peristomal hernia 4/22 - reg diet - wound care - binder  VTE - LMWH Dispo - med surg, likely D/C tomorrow if tolerates diet and pain controlled on PO only  LOS: 4 days    Dorena Gander, MD, MPH, FACS Trauma & General Surgery Use [email or web address] to contact on call provider  04/17/2024

## 2024-04-18 MED ORDER — OXYCODONE HCL 5 MG PO TABS: 5.0000 mg | ORAL_TABLET | Freq: Four times a day (QID) | ORAL | 0 refills | Status: DC | PRN

## 2024-04-18 MED ORDER — METHOCARBAMOL 1000 MG PO TABS: 1000.0000 mg | ORAL_TABLET | Freq: Three times a day (TID) | ORAL | 1 refills | Status: DC

## 2024-04-18 NOTE — Plan of Care (Signed)

## 2024-04-18 NOTE — Progress Notes (Signed)
 AVS Given and reviewed. Patient escorted to main entrance and via WC to wife in car for transport

## 2024-04-18 NOTE — Plan of Care (Signed)
 Adequate for discharge.

## 2024-04-19 NOTE — Discharge Summary (Signed)
 Physician Discharge Summary  Patient ID: Carl Briggs MRN: [id number] DOB/AGE: Jan 31, 1971 53 y.o.  Admit date: 04/13/2024 Discharge date: 04/19/2024  Admission Diagnoses: Ileostomy in place, parastomal hernia  Discharge Diagnoses:  Principal Problem:   S/P closure of ileostomy Status post repair of parastomal hernia with mesh  Discharged Condition: good  Hospital Course: Patient underwent ileostomy takedown and repair of parastomal hernia with mesh on the day of admission.  Postoperatively he required intravenous pain medication and we awaited return of bowel function.  He gradually improved.  He was advanced to a regular diet.  Pain medication was transition to orals and he was discharged home in stable condition.  Consults: None  Significant Diagnostic Studies: None  Treatments: Surgery  Discharge Exam: Blood pressure 118/84, pulse 83, temperature 98.5 F (36.9 C), temperature source Oral, resp. rate 17, height 5\' 2"  (1.575 m), weight 76.2 kg, SpO2 95%. See progress note  Disposition: Discharge disposition: 01-Home or Self Care       Discharge Instructions     Diet - low sodium heart healthy   Complete by: As directed    Discharge instructions   Complete by: As directed    CCS      [facility], [place] [phone]  OPEN ABDOMINAL SURGERY: POST OP INSTRUCTIONS  Always review your discharge instruction sheet given to you by the facility where your surgery was performed.  IF YOU HAVE DISABILITY OR FAMILY LEAVE FORMS, YOU MUST BRING THEM TO THE OFFICE FOR PROCESSING.  PLEASE DO NOT GIVE THEM TO YOUR DOCTOR.  A prescription for pain medication may be given to you upon discharge.  Take your pain medication as prescribed, if needed.  If narcotic pain medicine is not needed, then you may take acetaminophen  (Tylenol ) or ibuprofen  (Advil ) as needed. Take your usually prescribed medications unless otherwise directed. If you need a refill on your pain  medication, please contact your pharmacy. They will contact our office to request authorization.  Prescriptions will not be filled after 5pm or on week-ends. You should follow a light diet the first few days after arrival home, such as soup and crackers, pudding, etc.unless your doctor has advised otherwise. A high-fiber, low fat diet can be resumed as tolerated.   Be sure to include lots of fluids daily. Most patients will experience some swelling and bruising on the chest and neck area.  Ice packs will help.  Swelling and bruising can take several days to resolve Most patients will experience some swelling and bruising in the area of the incision. Ice pack will help. Swelling and bruising can take several days to resolve..  It is common to experience some constipation if taking pain medication after surgery.  Increasing fluid intake and taking a stool softener will usually help or prevent this problem from occurring.  A mild laxative (Milk of Magnesia or Miralax ) should be taken according to package directions if there are no bowel movements after 48 hours.  You may have steri-strips (small skin tapes) in place directly over the incision.  These strips should be left on the skin for 7-10 days.  If your surgeon used skin glue on the incision, you may shower in 24 hours.  The glue will flake off over the next 2-3 weeks.  Any sutures or staples will be removed at the office during your follow-up visit. You may find that a light gauze bandage over your incision may keep your staples from being rubbed or pulled. You may shower and replace the  bandage daily. ACTIVITIES:  You may resume regular (light) daily activities beginning the next day-such as daily self-care, walking, climbing stairs-gradually increasing activities as tolerated.  You may have sexual intercourse when it is comfortable.  Refrain from any heavy lifting or straining until approved by your doctor. You may drive when you no longer are taking  prescription pain medication, you can comfortably wear a seatbelt, and you can safely maneuver your car and apply brakes Return to Work: ___________________________________ Carl Briggs should see your doctor in the office for a follow-up appointment approximately two weeks after your surgery.  Make sure that you call for this appointment within a day or two after you arrive home to insure a convenient appointment time. OTHER INSTRUCTIONS:  _____________________________________________________________ _____________________________________________________________  WHEN TO CALL YOUR DOCTOR: Fever over 101.0 Inability to urinate Nausea and/or vomiting Extreme swelling or bruising Continued bleeding from incision. Increased pain, redness, or drainage from the incision. Difficulty swallowing or breathing Muscle cramping or spasms. Numbness or tingling in hands or feet or around lips.  The clinic staff is available to answer your questions during regular business hours.  Please don't hesitate to call and ask to speak to one of the nurses if you have concerns.  For further questions, please visit [email or web address]   Increase activity slowly   Complete by: As directed    Lifting restrictions   Complete by: As directed    No levanta mas que 10lb para 6 semanas      Allergies as of 04/18/2024   No Known Allergies      Medication List     STOP taking these medications    acetaminophen  500 MG tablet Commonly known as: TYLENOL    ALKA-SELTZER PO   aspirin  EC 81 MG tablet   dicyclomine  20 MG tablet Commonly known as: BENTYL    docusate sodium  100 MG capsule Commonly known as: COLACE   fiber Pack packet   ibuprofen  200 MG tablet Commonly known as: ADVIL    pantoprazole  20 MG tablet Commonly known as: PROTONIX        TAKE these medications    escitalopram  10 MG tablet Commonly known as: Lexapro  Take 1 tablet (10 mg total) by mouth daily. What changed:  when to take  this reasons to take this   Methocarbamol  1000 MG Tabs Take 1,000 mg by mouth 3 (three) times daily.   oxyCODONE  5 MG immediate release tablet Commonly known as: Roxicodone  Take 1 tablet (5 mg total) by mouth every 6 (six) hours as needed for severe pain (pain score 7-10).         Signed: Cloyce Darby 04/19/2024, 3:28 PM

## 2024-12-16 ENCOUNTER — Emergency Department (HOSPITAL_COMMUNITY): Payer: Self-pay

## 2024-12-16 ENCOUNTER — Observation Stay (HOSPITAL_COMMUNITY)
Admission: EM | Admit: 2024-12-16 | Discharge: 2024-12-18 | Disposition: A | Discharge status: Home / Self Care | Payer: Self-pay | Attending: Internal Medicine | Admitting: Internal Medicine

## 2024-12-16 ENCOUNTER — Encounter (HOSPITAL_COMMUNITY): Payer: Self-pay | Admitting: Emergency Medicine

## 2024-12-16 ENCOUNTER — Other Ambulatory Visit: Payer: Self-pay

## 2024-12-16 DIAGNOSIS — R531 Weakness: Secondary | ICD-10-CM

## 2024-12-16 DIAGNOSIS — I10 Essential (primary) hypertension: Secondary | ICD-10-CM | POA: Insufficient documentation

## 2024-12-16 DIAGNOSIS — E878 Other disorders of electrolyte and fluid balance, not elsewhere classified: Secondary | ICD-10-CM | POA: Insufficient documentation

## 2024-12-16 DIAGNOSIS — K8012 Calculus of gallbladder with acute and chronic cholecystitis without obstruction: Principal | ICD-10-CM | POA: Insufficient documentation

## 2024-12-16 DIAGNOSIS — E785 Hyperlipidemia, unspecified: Secondary | ICD-10-CM | POA: Insufficient documentation

## 2024-12-16 DIAGNOSIS — F10939 Alcohol use, unspecified with withdrawal, unspecified: Secondary | ICD-10-CM | POA: Diagnosis present

## 2024-12-16 DIAGNOSIS — E860 Dehydration: Secondary | ICD-10-CM | POA: Insufficient documentation

## 2024-12-16 DIAGNOSIS — K8000 Calculus of gallbladder with acute cholecystitis without obstruction: Secondary | ICD-10-CM | POA: Diagnosis present

## 2024-12-16 DIAGNOSIS — F419 Anxiety disorder, unspecified: Secondary | ICD-10-CM | POA: Insufficient documentation

## 2024-12-16 DIAGNOSIS — M6281 Muscle weakness (generalized): Secondary | ICD-10-CM | POA: Insufficient documentation

## 2024-12-16 DIAGNOSIS — F10929 Alcohol use, unspecified with intoxication, unspecified: Secondary | ICD-10-CM | POA: Diagnosis present

## 2024-12-16 DIAGNOSIS — F1010 Alcohol abuse, uncomplicated: Principal | ICD-10-CM

## 2024-12-16 HISTORY — DX: Prediabetes: R73.03

## 2024-12-16 LAB — CBC WITH DIFFERENTIAL/PLATELET
Abs Immature Granulocytes: 0.01 K/uL (ref 0.00–0.07)
Basophils Absolute: 0 K/uL (ref 0.0–0.1)
Basophils Relative: 0 %
Eosinophils Absolute: 0 K/uL (ref 0.0–0.5)
Eosinophils Relative: 0 %
HCT: 48.4 % (ref 39.0–52.0)
Hemoglobin: 16.9 g/dL (ref 13.0–17.0)
Immature Granulocytes: 0 %
Lymphocytes Relative: 51 %
Lymphs Abs: 3.5 K/uL (ref 0.7–4.0)
MCH: 30.1 pg (ref 26.0–34.0)
MCHC: 34.9 g/dL (ref 30.0–36.0)
MCV: 86.3 fL (ref 80.0–100.0)
Monocytes Absolute: 0.4 K/uL (ref 0.1–1.0)
Monocytes Relative: 6 %
Neutro Abs: 2.9 K/uL (ref 1.7–7.7)
Neutrophils Relative %: 43 %
Platelets: 236 K/uL (ref 150–400)
RBC: 5.61 MIL/uL (ref 4.22–5.81)
RDW: 14.3 % (ref 11.5–15.5)
WBC: 6.9 K/uL (ref 4.0–10.5)
nRBC: 0 % (ref 0.0–0.2)

## 2024-12-16 LAB — COMPREHENSIVE METABOLIC PANEL WITH GFR
ALT: 32 U/L (ref 0–44)
AST: 40 U/L (ref 15–41)
Albumin: 4.5 g/dL (ref 3.5–5.0)
Alkaline Phosphatase: 157 U/L — ABNORMAL HIGH (ref 38–126)
Anion gap: 15 (ref 5–15)
BUN: 8 mg/dL (ref 6–20)
CO2: 24 mmol/L (ref 22–32)
Calcium: 8.7 mg/dL — ABNORMAL LOW (ref 8.9–10.3)
Chloride: 100 mmol/L (ref 98–111)
Creatinine, Ser: 0.69 mg/dL (ref 0.61–1.24)
GFR, Estimated: >60 mL/min
Glucose, Bld: 108 mg/dL — ABNORMAL HIGH (ref 70–99)
Potassium: 3.6 mmol/L (ref 3.5–5.1)
Sodium: 138 mmol/L (ref 135–145)
Total Bilirubin: 0.8 mg/dL (ref 0.0–1.2)
Total Protein: 8.1 g/dL (ref 6.5–8.1)

## 2024-12-16 LAB — URINALYSIS, ROUTINE W REFLEX MICROSCOPIC
Bacteria, UA: NONE SEEN
Bilirubin Urine: NEGATIVE
Glucose, UA: NEGATIVE mg/dL
Ketones, ur: NEGATIVE mg/dL
Leukocytes,Ua: NEGATIVE
Nitrite: NEGATIVE
Protein, ur: NEGATIVE mg/dL
Specific Gravity, Urine: 1.045 — ABNORMAL HIGH (ref 1.005–1.030)
pH: 8 (ref 5.0–8.0)

## 2024-12-16 LAB — PROTIME-INR
INR: 1.1 (ref 0.8–1.2)
Prothrombin Time: 14.7 s (ref 11.4–15.2)

## 2024-12-16 LAB — I-STAT CHEM 8, ED
BUN: 7 mg/dL (ref 6–20)
Calcium, Ion: 1.03 mmol/L — ABNORMAL LOW (ref 1.15–1.40)
Chloride: 100 mmol/L (ref 98–111)
Creatinine, Ser: 0.8 mg/dL (ref 0.61–1.24)
Glucose, Bld: 111 mg/dL — ABNORMAL HIGH (ref 70–99)
HCT: 51 % (ref 39.0–52.0)
Hemoglobin: 17.3 g/dL — ABNORMAL HIGH (ref 13.0–17.0)
Potassium: 3.3 mmol/L — ABNORMAL LOW (ref 3.5–5.1)
Sodium: 140 mmol/L (ref 135–145)
TCO2: 24 mmol/L (ref 22–32)

## 2024-12-16 LAB — MAGNESIUM: Magnesium: 1.7 mg/dL (ref 1.7–2.4)

## 2024-12-16 LAB — ETHANOL: Alcohol, Ethyl (B): 125 mg/dL — ABNORMAL HIGH

## 2024-12-16 LAB — RESP PANEL BY RT-PCR (RSV, FLU A&B, COVID)  RVPGX2
Influenza A by PCR: NEGATIVE
Influenza B by PCR: NEGATIVE
Resp Syncytial Virus by PCR: NEGATIVE
SARS Coronavirus 2 by RT PCR: NEGATIVE

## 2024-12-16 LAB — TROPONIN T, HIGH SENSITIVITY: Troponin T High Sensitivity: <15 ng/L (ref 0–19)

## 2024-12-16 LAB — FOLATE: Folate: 18.3 ng/mL

## 2024-12-16 LAB — LIPASE, BLOOD: Lipase: 26 U/L (ref 11–51)

## 2024-12-16 MED ORDER — SODIUM CHLORIDE 0.9 % IV BOLUS
1000.0000 mL | Freq: Once | INTRAVENOUS | Status: AC
  Administered 2024-12-16: 1000 mL via INTRAVENOUS

## 2024-12-16 MED ORDER — THIAMINE MONONITRATE 100 MG PO TABS: 100.0000 mg | ORAL_TABLET | Freq: Every day | ORAL | Status: DC

## 2024-12-16 MED ORDER — ESCITALOPRAM OXALATE 10 MG PO TABS
10.0000 mg | ORAL_TABLET | Freq: Every day | ORAL | Status: DC
  Administered 2024-12-17 – 2024-12-18 (×2): 10 mg via ORAL
  Filled 2024-12-16 (×2): qty 1

## 2024-12-16 MED ORDER — LORAZEPAM 2 MG/ML IJ SOLN
1.0000 mg | INTRAMUSCULAR | Status: DC | PRN
  Administered 2024-12-16 – 2024-12-17 (×2): 1 mg via INTRAVENOUS
  Filled 2024-12-16 (×2): qty 1

## 2024-12-16 MED ORDER — THIAMINE MONONITRATE 100 MG PO TABS
100.0000 mg | ORAL_TABLET | Freq: Every day | ORAL | Status: DC
  Administered 2024-12-16 – 2024-12-18 (×2): 100 mg via ORAL
  Filled 2024-12-16 (×2): qty 1

## 2024-12-16 MED ORDER — THIAMINE HCL 100 MG/ML IJ SOLN
100.0000 mg | Freq: Every day | INTRAMUSCULAR | Status: DC
  Administered 2024-12-17: 100 mg via INTRAVENOUS
  Filled 2024-12-16 (×3): qty 2

## 2024-12-16 MED ORDER — PANTOPRAZOLE SODIUM 40 MG IV SOLR
40.0000 mg | Freq: Once | INTRAVENOUS | Status: AC
  Administered 2024-12-16: 40 mg via INTRAVENOUS
  Filled 2024-12-16: qty 10

## 2024-12-16 MED ORDER — LORAZEPAM 2 MG/ML IJ SOLN
2.0000 mg | Freq: Once | INTRAMUSCULAR | Status: AC
  Administered 2024-12-16: 2 mg via INTRAVENOUS
  Filled 2024-12-16: qty 1

## 2024-12-16 MED ORDER — ADULT MULTIVITAMIN W/MINERALS CH: 1.0000 | ORAL_TABLET | Freq: Every day | ORAL | Status: DC

## 2024-12-16 MED ORDER — LORAZEPAM 1 MG PO TABS
1.0000 mg | ORAL_TABLET | ORAL | Status: DC | PRN
  Administered 2024-12-17 (×2): 1 mg via ORAL
  Filled 2024-12-16 (×2): qty 1

## 2024-12-16 MED ORDER — POLYETHYLENE GLYCOL 3350 17 G PO PACK: 17.0000 g | PACK | Freq: Every day | ORAL | Status: DC | PRN

## 2024-12-16 MED ORDER — PIPERACILLIN-TAZOBACTAM 3.375 G IVPB
3.3750 g | Freq: Three times a day (TID) | INTRAVENOUS | Status: DC
  Administered 2024-12-16 – 2024-12-18 (×5): 3.375 g via INTRAVENOUS
  Filled 2024-12-16 (×5): qty 50

## 2024-12-16 MED ORDER — IOHEXOL 350 MG/ML SOLN
75.0000 mL | Freq: Once | INTRAVENOUS | Status: AC | PRN
  Administered 2024-12-16: 75 mL via INTRAVENOUS

## 2024-12-16 MED ORDER — ENOXAPARIN SODIUM 40 MG/0.4ML IJ SOSY: 40.0000 mg | PREFILLED_SYRINGE | INTRAMUSCULAR | Status: DC

## 2024-12-16 MED ORDER — FOLIC ACID 1 MG PO TABS: 1.0000 mg | ORAL_TABLET | Freq: Every day | ORAL | Status: DC

## 2024-12-16 MED ORDER — LISINOPRIL 20 MG PO TABS
20.0000 mg | ORAL_TABLET | Freq: Every day | ORAL | Status: DC
  Administered 2024-12-16 – 2024-12-18 (×3): 20 mg via ORAL
  Filled 2024-12-16 (×3): qty 1

## 2024-12-16 MED ORDER — ACETAMINOPHEN 650 MG RE SUPP: 650.0000 mg | Freq: Four times a day (QID) | RECTAL | Status: DC | PRN

## 2024-12-16 MED ORDER — ACETAMINOPHEN 325 MG PO TABS
650.0000 mg | ORAL_TABLET | Freq: Four times a day (QID) | ORAL | Status: DC | PRN
  Administered 2024-12-16 – 2024-12-18 (×3): 650 mg via ORAL
  Filled 2024-12-16 (×3): qty 2

## 2024-12-16 MED ORDER — FOLIC ACID 1 MG PO TABS
1.0000 mg | ORAL_TABLET | Freq: Every day | ORAL | Status: DC
  Administered 2024-12-16 – 2024-12-18 (×3): 1 mg via ORAL
  Filled 2024-12-16 (×3): qty 1

## 2024-12-16 MED ORDER — PIPERACILLIN-TAZOBACTAM 3.375 G IVPB 30 MIN
3.3750 g | Freq: Once | INTRAVENOUS | Status: AC
  Administered 2024-12-16: 3.375 g via INTRAVENOUS
  Filled 2024-12-16: qty 50

## 2024-12-16 MED ORDER — LORAZEPAM 2 MG/ML IJ SOLN
1.0000 mg | Freq: Once | INTRAMUSCULAR | Status: AC
  Administered 2024-12-16: 1 mg via INTRAVENOUS
  Filled 2024-12-16: qty 1

## 2024-12-16 MED ORDER — ADULT MULTIVITAMIN W/MINERALS CH
1.0000 | ORAL_TABLET | Freq: Every day | ORAL | Status: DC
  Administered 2024-12-16 – 2024-12-18 (×3): 1 via ORAL
  Filled 2024-12-16 (×3): qty 1

## 2024-12-16 MED ORDER — ONDANSETRON HCL 4 MG/2ML IJ SOLN: 4.0000 mg | Freq: Once | INTRAMUSCULAR | Status: DC | PRN

## 2024-12-16 MED ORDER — ONDANSETRON HCL 4 MG/2ML IJ SOLN
4.0000 mg | Freq: Once | INTRAMUSCULAR | Status: AC
  Administered 2024-12-16: 4 mg via INTRAVENOUS
  Filled 2024-12-16: qty 2

## 2024-12-16 NOTE — Consult Note (Signed)
 "   Reason for Consult:  Concern for cholecystitis Referring Provider: Dreama, MD  HPI  [facility] Carl Briggs is an 53 y.o. male who presented to [facility] with generalized weakness  Patient has been drinking heavily the last few days but had been sober for a period of time prior to this. Family cannot tell if he is intoxicated or having another issue but has been acting intoxi. No fevers/chills.  There was concern for imbalance and tremulousness before EMS arrival. There was concern for possible seizure activity on EMS arrival and patient then began to have SOB and chest pain which have since resolved. EDP observed facial twitching that resolved and has not again been appreciated.  Head imaging without intracranial abnormalities. CT AP showed cholelithiasis and RUQ US  showed cholelithiasis, mild GB wall thickening and + sonographic murphy's sign. ALP elevated to 157 but all other LFTs WNL. Normal WBC.  Patient on my exam complains of primarily a strong headache and then some mild chest pain that is improving. No abdominal pain, no nausea, no emesis.  He has had laparotomy om 2024 with Dr. 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  10 point review of systems is negative except as listed above in HPI.  Objective  Past Medical History: Past Medical History:  Diagnosis Date   Anxiety    GI bleed 11/07/2018   High cholesterol    Hypertension     Past Surgical History: Past Surgical History:  Procedure Laterality Date   COLECTOMY N/A 07/25/2023   Procedure: OPEN  RIGHT COLECTOMY;  Surgeon: Carl Moles, MD;  Location: [facility];  Service: General;  Laterality: N/A;   COLONOSCOPY WITH PROPOFOL  N/A 11/09/2018   Procedure: COLONOSCOPY WITH PROPOFOL ;  Surgeon: Burnette Fallow, MD;  Location: [facility] ENDOSCOPY;  Service: Endoscopy;  Laterality: N/A;   ILEOSTOMY  CLOSURE N/A 04/13/2024   Procedure: CLOSURE, ILEOSTOMY;  Surgeon: Carl Moles, MD;  Location: [facility] OR;  Service: General;  Laterality: N/A;   LEFT HEART CATH AND CORONARY ANGIOGRAPHY N/A 07/23/2023   Procedure: LEFT HEART CATH AND CORONARY ANGIOGRAPHY;  Surgeon: Verlin Lonni BIRCH, MD;  Location: [facility] INVASIVE CV LAB;  Service: Cardiovascular;  Laterality: N/A;   PARASTOMAL HERNIA REPAIR N/A 04/13/2024   Procedure: REPAIR, HERNIA, PARASTOMAL;  Surgeon: Carl Moles, MD;  Location: [facility] OR;  Service: General;  Laterality: N/A;    Family History:  Family History  Problem Relation Age of Onset   Diabetes Mother     Social History:  reports that he has never smoked. He has never used smokeless tobacco. He reports that he does not currently use alcohol. He reports that he does not currently use drugs.  Allergies: Allergies[1]  Medications: I have reviewed the patient's current medications.  Labs: I have personally reviewed all labs for the past 24h  Imaging: I have personally reviewed and interpreted all imaging for the past 24h and agree with the radiologist's impression.  MR BRAIN WO CONTRAST Result Date: 12/16/2024 EXAM: MRI Brain Without Contrast 12/16/2024 04:24:00 PM TECHNIQUE: Multiplanar multisequence MRI of the head/brain was performed without the administration of intravenous contrast. COMPARISON: None available. CLINICAL HISTORY: Neuro deficit, acute, stroke suspected FINDINGS: BRAIN AND VENTRICLES: No acute infarct. No intracranial hemorrhage. No mass. No midline shift. No hydrocephalus. The sella is unremarkable. Normal flow voids. ORBITS: No acute abnormality. SINUSES AND MASTOIDS: No acute abnormality. BONES AND SOFT TISSUES:  Normal marrow signal. No acute soft tissue abnormality. IMPRESSION: 1. No acute intracranial abnormality. Electronically signed by: Carl Briggs 12/16/2024 04:37 PM EST RP Workstation: [id number]   US  Abdomen Limited RUQ (LIVER/GB) Result Date:  12/16/2024 CLINICAL DATA:  Pain. EXAM: ULTRASOUND ABDOMEN LIMITED RIGHT UPPER QUADRANT COMPARISON:  July 24, 2020 FINDINGS: Gallbladder: Multiple shadowing, echogenic gallstones are seen within the gallbladder lumen. The largest measures approximately 1.3 cm. Additional 6.0 mm x 2.7 mm x 5.1 mm nonshadowing echogenic focus is seen along the nondependent wall of the gallbladder. There is mild gallbladder wall thickening (3.7 mm). A positive sonographic Beverley sign is noted by sonographer. Common bile duct: Diameter: 5.7 mm Liver: No focal lesion identified. There is diffusely increased echogenicity of the liver parenchyma. Portal vein is patent on color Doppler imaging with normal direction of blood flow towards the liver. Other: None. IMPRESSION: 1. Cholelithiasis with additional findings consistent with acute cholecystitis. 2. Findings suggestive of a small, likely benign gallbladder polyp. No follow-up imaging is recommended. This recommendation follows ACR consensus guidelines: White Paper of the ACR Incidental Findings Committee II on Gallbladder and Biliary Findings. J Am Coll Radiol 2013:;10:953-956. 3. Hepatic steatosis. Electronically Signed   By: Suzen Dials M.D.   On: 12/16/2024 15:12   CT CHEST ABDOMEN PELVIS W CONTRAST Result Date: 12/16/2024 EXAM: CT CHEST, ABDOMEN AND PELVIS WITH CONTRAST 12/16/2024 12:45:25 PM TECHNIQUE: CT of the chest, abdomen and pelvis was performed with the administration of 75 mL of iohexol  (OMNIPAQUE ) 350 MG/ML injection. Multiplanar reformatted images are provided for review. Automated exposure control, iterative reconstruction, and/or weight based adjustment of the mA/kV was utilized to reduce the radiation dose to as low as reasonably achievable. COMPARISON: CT angiography chest 09/06/2023, CT abdomen and pelvis 01/26/2023, CT abdomen and pelvis 09/21/2020. CLINICAL HISTORY: Sepsis. FINDINGS: CHEST: MEDIASTINUM AND LYMPH NODES: Heart and pericardium are  unremarkable. The central airways are clear. Calcified prevascular lymph node. Calcified left hilar lymph node. The main pulmonary artery is normal in caliber. No central or proximal segmental pulmonary embolus. Limited evaluation more distally due to timing of contrast. The thoracic aorta is normal in caliber. LUNGS AND PLEURA: Lobe mild architectural distortion and scarring. Bibasilar dependent atelectasis. There is a chronic stable centrally calcified anterior left upper lobe 1.1 x 1.3 cm pulmonary nodule consistent with granulomata. Interval development of a right middle lobe subpleural 4 mm solid micronodule along the minor fissure. No pleural effusion or pneumothorax. ABDOMEN AND PELVIS: LIVER: Diffusely hypodense hepatic parenchyma compared to the spleen. There is a right hepatic dome enhancing lesion mass measuring 1.4 x 1.5 cm that is consistent with a stable known hepatic hemangioma. GALLBLADDER AND BILE DUCTS: Calcified gallstones within the gallbladder lumen. 4 mm calcified gallstone within the cystic duct. No cholelithiasis of the common bile duct. No associated gallbladder wall thickening or pericholecystic fluid. No biliary ductal dilatation. SPLEEN: No acute abnormality. PANCREAS: No acute abnormality. ADRENAL GLANDS: No acute abnormality. KIDNEYS, URETERS AND BLADDER: Fluid attenuation of the right kidney likely represents a simple renal cyst. Simple renal cysts do not require additional follow-up unless clinically indicated due to signs/symptoms. No stones in the kidneys or ureters. No hydronephrosis. No perinephric or periureteral stranding. Urinary bladder is unremarkable. GI AND BOWEL: Stomach demonstrates no acute abnormality. No small or large bowel thickening or dilatation. There is no bowel obstruction. Status post appendectomy. Colonic diverticulosis. REPRODUCTIVE ORGANS: The prostate is prominent in size, measuring up to 4.9 cm. PERITONEUM AND RETROPERITONEUM: No ascites. No free air.  VASCULATURE: Aorta is normal in caliber. ABDOMINAL AND PELVIS LYMPH NODES: No lymphadenopathy. BONES AND SOFT TISSUES: Diastasis rectus. No definite abdominal wall hernia. No inguinal hernia. No acute osseous abnormality. No focal soft tissue abnormality. IMPRESSION: 1. No acute findings in the chest, abdomen, and pelvis related to sepsis. 2. Calcified gallstones within the gallbladder lumen and cystic duct, without CT findings of acute cholecystitis or choledocholithiasis of the common bile duct. 3. New 4 mm right middle lobe solid pulmonary micronodule, with no routine follow-up imaging recommended as per Fleischner Society Guidelines. 4. Sequelae of prior granulomatous disease of the right lung. 5. Other, non-acute and/or normal findings as above. Electronically signed by: Morgane Naveau MD 12/16/2024 01:18 PM EST RP Workstation: [id number]   CT Head Wo Contrast Result Date: 12/16/2024 EXAM: CT HEAD WITHOUT CONTRAST 12/16/2024 12:45:25 PM TECHNIQUE: CT of the head was performed without the administration of intravenous contrast. Automated exposure control, iterative reconstruction, and/or weight based adjustment of the mA/kV was utilized to reduce the radiation dose to as low as reasonably achievable. COMPARISON: CT head 07/20/2023. CLINICAL HISTORY: 53 year old male with sepsis, altered mental status. FINDINGS: BRAIN AND VENTRICLES: No acute hemorrhage. No evidence of acute infarct. No hydrocephalus. No extra-axial collection. No mass effect or midline shift. Brain volume remains normal for age. Stable and normal gray white differentiation for age. No suspicious intracranial vascular hyperdensity. ORBITS: No acute abnormality. SINUSES: Scattered paranasal sinus mucosal thickening and small retention cysts. Aeration appears stable to improved from last year. SOFT TISSUES AND SKULL: Chronic left forehead and vertex scalp soft tissue scarring. Tympanic cavities and mastoids appear well aerated. No skull  fracture. IMPRESSION: 1. Normal for age non-contrast CT appearance of the brain. Electronically signed by: Helayne Hurst MD 12/16/2024 12:57 PM EST RP Workstation: [id number]   DG Chest Portable 1 View Result Date: 12/16/2024 CLINICAL DATA:  Shortness of breath. EXAM: PORTABLE CHEST 1 VIEW COMPARISON:  09/06/2023 FINDINGS: Low volume film. The cardio pericardial silhouette is enlarged. There is pulmonary vascular congestion without overt pulmonary edema. No acute bony abnormality. Telemetry leads overlie the chest. IMPRESSION: Low volume film with vascular congestion. Electronically Signed   By: Camellia Candle M.D.   On: 12/16/2024 11:35     Physical Exam Blood pressure (!) 125/92, pulse (!) 108, temperature 97.6 F (36.4 C), temperature source Oral, resp. rate 15, height 5' 2 (1.575 m), weight 77 kg, SpO2 100%. General: no acute distress HEENT: normocephalic, atraumatic Oropharynx: mucous membranes moist CV: Regular rate and rhythm, hypertensive Chest: equal chest rise bilaterally normal respiratory effort on room air Abdomen: soft, nontender, nondistended, and remote surgical scars Extremities: moves all extremities Skin: warm, dry, no rashes Psych: normal memory, normal mood/affect  Neuro: No focal neurologic deficits    Assessment   Carl Briggs is an 53 y.o. male admitted for concern for weakness and AMS, alcohol abuse and concern for possible cholecystitis.  Plan  - Recommend HIDA - Regular diet now, NPO at midnight - If HIDA normal, can have regular diet and fu PRN if ever has symptomatic cholelithiasis but patient denies this during our discussion this evening. If positive, we will discuss intervention with patient - IVF per TRH - DVT - SCDs, ok for DVT ppx from surgical perspective  I reviewed ED provider notes, hospitalist notes, last 24 h vitals and pain scores, last 48 h intake and output, last 24 h labs and trends, and last 24 h imaging results.  This care  required moderate level of  medical decision making.    Orie Silversmith, MD General Surgery, Surgical Critical Care and Trauma        [1] No Known Allergies  "

## 2024-12-16 NOTE — ED Provider Notes (Signed)
 Right sided facial twitching for about 2 hours, shaking all over, the ativan  helped  No other numbness, weakness, no change in vision, denies difficulty walking The last  time had withdrawal had high pressure but not other symptoms. No hx of seizures with aocohol withdrawal. Last night last drink (drinking for last 3 days), but before that had been 3 years without drinking.  Denies havingt abdominal pain right now, feels like more anxiety in the abdomen.  No fevers. Nausea, no vomiting.    Physical Exam  BP (!) 145/92   Pulse (!) 104   Temp 97.9 F (36.6 C) (Oral)   Resp 13   Ht 5' 2 (1.575 m)   Wt 77 kg   SpO2 100%   BMI 31.05 kg/m   Physical Exam  Procedures  Procedures  ED Course / MDM    Medical Decision Making Amount and/or Complexity of Data Reviewed Labs: ordered. Radiology: ordered.  Risk Prescription drug management. Decision regarding hospitalization.   Received care of patient from Dr. Ruthe.  Please see his note for prior history, physical and care.  Feel she is a 53 year old male with a history of alcohol abuse who presented with concern for shakiness, abdominal pain, anxiety in the setting of drinking alcohol and eating very little over the last 3 days after being previously sober for 3 years.  CT showed calcified gallstones and ultrasound is pending given his abdominal pain.  An MRI brain has been ordered given his right facial symptoms, difficulty walking.  Right upper ultrasound returned and shows concern for cholecystitis.  On my reevaluation, he is not having any significant right upper quadrant tenderness on exam.  I have ordered Zosyn  and consulted general surgery for evaluation.  While ultrasound is concerning for cholecystitis and he did present with abdominal pain, his clinical exam at this time does seem to be less consistent with that and we will have surgery evaluate.  Ordered a HIDA scan.  MRI brain returned and is within normal limits. Suspect  some symptoms secondary to alcohol withdrawal.  Will admit to the hospital with concern for these withdrawal symptoms, abdominal pain, r/o cholecystitis.          Dreama Longs, MD 12/17/24 [id number]

## 2024-12-16 NOTE — ED Notes (Signed)
 Patient transported to CT

## 2024-12-16 NOTE — ED Notes (Signed)
 Patient transported to MRI

## 2024-12-16 NOTE — ED Provider Notes (Signed)
 "  EMERGENCY DEPARTMENT AT [facility] Provider Note   CSN: [id number] Arrival date & time: 12/16/24  1053     Patient presents with: Weakness   Carl Briggs is a 53 y.o. male.   Patient here with generalized weakness.  He is feeling weak all over.  He has been drinking heavily the last couple days but had been sober for quite some time before that he states.  Started to feel really weak here the last few days but family states that he is been drinking heavily and they cannot really tell if he was drunk or if he was just having some other issue.  He denies any fevers or chills.  Blood pressure has been high.  Family states that he has been acting intoxicated this whole time.  He has history of high cholesterol anxiety polysubstance abuse.  He had a perforated bowel in the past.  He is having some chest pain and some abdominal discomfort but he feels generally bad all over.  He has not been aware of any black or bloody stools.  The history is provided by the patient. A language interpreter was used.       Prior to Admission medications  Medication Sig Start Date End Date Taking? Authorizing Provider  escitalopram  (LEXAPRO ) 10 MG tablet Take 1 tablet (10 mg total) by mouth daily. Patient taking differently: Take 10 mg by mouth daily as needed (anxiety). 09/26/23 09/25/24  Tawkaliyar, Roya, DO  methocarbamol  1000 MG TABS Take 1,000 mg by mouth 3 (three) times daily. 04/18/24   Sebastian Moles, MD  oxyCODONE  (ROXICODONE ) 5 MG immediate release tablet Take 1 tablet (5 mg total) by mouth every 6 (six) hours as needed for severe pain (pain score 7-10). 04/18/24   Sebastian Moles, MD    Allergies: Patient has no known allergies.    Review of Systems  Updated Vital Signs BP (!) 145/92   Pulse (!) 104   Temp 97.9 F (36.6 C) (Oral)   Resp 13   Ht 5' 2 (1.575 m)   Wt 77 kg   SpO2 100%   BMI 31.05 kg/m   Physical Exam Vitals and nursing note reviewed.   Constitutional:      General: He is in acute distress.     Appearance: He is well-developed. He is ill-appearing.  HENT:     Head: Normocephalic and atraumatic.     Mouth/Throat:     Mouth: Mucous membranes are dry.  Eyes:     Conjunctiva/sclera: Conjunctivae normal.  Cardiovascular:     Rate and Rhythm: Regular rhythm. Tachycardia present.     Heart sounds: No murmur heard. Pulmonary:     Effort: Pulmonary effort is normal. No respiratory distress.     Breath sounds: Normal breath sounds.  Abdominal:     Palpations: Abdomen is soft.     Tenderness: There is no abdominal tenderness.  Musculoskeletal:        General: No swelling.     Cervical back: Normal range of motion and neck supple.  Skin:    General: Skin is warm and dry.     Capillary Refill: Capillary refill takes less than 2 seconds.  Neurological:     Mental Status: He is alert.     Comments: Tremors in both hands  Psychiatric:        Mood and Affect: Mood normal.     (all labs ordered are listed, but only abnormal results are displayed) Labs Reviewed  COMPREHENSIVE METABOLIC PANEL WITH GFR - Abnormal; Notable for the following components:      Result Value   Glucose, Bld 108 (*)    Calcium  8.7 (*)    Alkaline Phosphatase 157 (*)    All other components within normal limits  URINALYSIS, ROUTINE W REFLEX MICROSCOPIC - Abnormal; Notable for the following components:   Specific Gravity, Urine 1.045 (*)    Hgb urine dipstick SMALL (*)    All other components within normal limits  ETHANOL - Abnormal; Notable for the following components:   Alcohol, Ethyl (B) 125 (*)    All other components within normal limits  I-STAT CHEM 8, ED - Abnormal; Notable for the following components:   Potassium 3.3 (*)    Glucose, Bld 111 (*)    Calcium , Ion 1.03 (*)    Hemoglobin 17.3 (*)    All other components within normal limits  RESP PANEL BY RT-PCR (RSV, FLU A&B, COVID)  RVPGX2  CBC WITH DIFFERENTIAL/PLATELET  LIPASE,  BLOOD  TROPONIN T, HIGH SENSITIVITY    EKG: None  Radiology: CT CHEST ABDOMEN PELVIS W CONTRAST Result Date: 12/16/2024 EXAM: CT CHEST, ABDOMEN AND PELVIS WITH CONTRAST 12/16/2024 12:45:25 PM TECHNIQUE: CT of the chest, abdomen and pelvis was performed with the administration of 75 mL of iohexol  (OMNIPAQUE ) 350 MG/ML injection. Multiplanar reformatted images are provided for review. Automated exposure control, iterative reconstruction, and/or weight based adjustment of the mA/kV was utilized to reduce the radiation dose to as low as reasonably achievable. COMPARISON: CT angiography chest 09/06/2023, CT abdomen and pelvis 01/26/2023, CT abdomen and pelvis 09/21/2020. CLINICAL HISTORY: Sepsis. FINDINGS: CHEST: MEDIASTINUM AND LYMPH NODES: Heart and pericardium are unremarkable. The central airways are clear. Calcified prevascular lymph node. Calcified left hilar lymph node. The main pulmonary artery is normal in caliber. No central or proximal segmental pulmonary embolus. Limited evaluation more distally due to timing of contrast. The thoracic aorta is normal in caliber. LUNGS AND PLEURA: Lobe mild architectural distortion and scarring. Bibasilar dependent atelectasis. There is a chronic stable centrally calcified anterior left upper lobe 1.1 x 1.3 cm pulmonary nodule consistent with granulomata. Interval development of a right middle lobe subpleural 4 mm solid micronodule along the minor fissure. No pleural effusion or pneumothorax. ABDOMEN AND PELVIS: LIVER: Diffusely hypodense hepatic parenchyma compared to the spleen. There is a right hepatic dome enhancing lesion mass measuring 1.4 x 1.5 cm that is consistent with a stable known hepatic hemangioma. GALLBLADDER AND BILE DUCTS: Calcified gallstones within the gallbladder lumen. 4 mm calcified gallstone within the cystic duct. No cholelithiasis of the common bile duct. No associated gallbladder wall thickening or pericholecystic fluid. No biliary ductal  dilatation. SPLEEN: No acute abnormality. PANCREAS: No acute abnormality. ADRENAL GLANDS: No acute abnormality. KIDNEYS, URETERS AND BLADDER: Fluid attenuation of the right kidney likely represents a simple renal cyst. Simple renal cysts do not require additional follow-up unless clinically indicated due to signs/symptoms. No stones in the kidneys or ureters. No hydronephrosis. No perinephric or periureteral stranding. Urinary bladder is unremarkable. GI AND BOWEL: Stomach demonstrates no acute abnormality. No small or large bowel thickening or dilatation. There is no bowel obstruction. Status post appendectomy. Colonic diverticulosis. REPRODUCTIVE ORGANS: The prostate is prominent in size, measuring up to 4.9 cm. PERITONEUM AND RETROPERITONEUM: No ascites. No free air. VASCULATURE: Aorta is normal in caliber. ABDOMINAL AND PELVIS LYMPH NODES: No lymphadenopathy. BONES AND SOFT TISSUES: Diastasis rectus. No definite abdominal wall hernia. No inguinal hernia. No acute osseous abnormality. No  focal soft tissue abnormality. IMPRESSION: 1. No acute findings in the chest, abdomen, and pelvis related to sepsis. 2. Calcified gallstones within the gallbladder lumen and cystic duct, without CT findings of acute cholecystitis or choledocholithiasis of the common bile duct. 3. New 4 mm right middle lobe solid pulmonary micronodule, with no routine follow-up imaging recommended as per Fleischner Society Guidelines. 4. Sequelae of prior granulomatous disease of the right lung. 5. Other, non-acute and/or normal findings as above. Electronically signed by: Morgane Naveau MD 12/16/2024 01:18 PM EST RP Workstation: [id number]   CT Head Wo Contrast Result Date: 12/16/2024 EXAM: CT HEAD WITHOUT CONTRAST 12/16/2024 12:45:25 PM TECHNIQUE: CT of the head was performed without the administration of intravenous contrast. Automated exposure control, iterative reconstruction, and/or weight based adjustment of the mA/kV was utilized to  reduce the radiation dose to as low as reasonably achievable. COMPARISON: CT head 07/20/2023. CLINICAL HISTORY: 54 year old male with sepsis, altered mental status. FINDINGS: BRAIN AND VENTRICLES: No acute hemorrhage. No evidence of acute infarct. No hydrocephalus. No extra-axial collection. No mass effect or midline shift. Brain volume remains normal for age. Stable and normal gray white differentiation for age. No suspicious intracranial vascular hyperdensity. ORBITS: No acute abnormality. SINUSES: Scattered paranasal sinus mucosal thickening and small retention cysts. Aeration appears stable to improved from last year. SOFT TISSUES AND SKULL: Chronic left forehead and vertex scalp soft tissue scarring. Tympanic cavities and mastoids appear well aerated. No skull fracture. IMPRESSION: 1. Normal for age non-contrast CT appearance of the brain. Electronically signed by: Helayne Hurst MD 12/16/2024 12:57 PM EST RP Workstation: [id number]   DG Chest Portable 1 View Result Date: 12/16/2024 CLINICAL DATA:  Shortness of breath. EXAM: PORTABLE CHEST 1 VIEW COMPARISON:  09/06/2023 FINDINGS: Low volume film. The cardio pericardial silhouette is enlarged. There is pulmonary vascular congestion without overt pulmonary edema. No acute bony abnormality. Telemetry leads overlie the chest. IMPRESSION: Low volume film with vascular congestion. Electronically Signed   By: Camellia Candle M.D.   On: 12/16/2024 11:35     Procedures   Medications Ordered in the ED  sodium chloride  0.9 % bolus 1,000 mL (1,000 mLs Intravenous New Bag/Given 12/16/24 1117)  ondansetron  (ZOFRAN ) injection 4 mg (4 mg Intravenous Given 12/16/24 1117)  LORazepam  (ATIVAN ) injection 2 mg (2 mg Intravenous Given 12/16/24 1117)  pantoprazole  (PROTONIX ) injection 40 mg (40 mg Intravenous Given 12/16/24 1416)  iohexol  (OMNIPAQUE ) 350 MG/ML injection 75 mL (75 mLs Intravenous Contrast Given 12/16/24 1250)  LORazepam  (ATIVAN ) injection 1 mg (1 mg  Intravenous Given 12/16/24 1426)                                    Medical Decision Making Amount and/or Complexity of Data Reviewed Labs: ordered. Radiology: ordered.  Risk Prescription drug management.   [facility] is here with generalized weakness.  He has been drinking heavily the last couple days but has apparently been sober the last few months for this.  Shakes and tremors all over.  Bilateral leg weakness.  Maybe left-sided facial droop per family but no really obvious findings on neurological exam.  He seems globally weak.  Sudden pain everywhere.  Differential diagnosis likely alcohol withdrawal related process but could be viral process could be some infectious process.  Symptoms less likely to be stroke head bleed but will get CT of the head chest abdomen pelvis will give Ativan  Zofran  fluid bolus  and reevaluate.  Per my review and interpretation of labs no significant leukocytosis anemia or electrolyte abnormality.  Alcohol level is a little bit elevated at 125.  He seems to be doing better after Ativan .  CT scan of his head is unremarkable.  CT of his chest abdomen pelvis shows no acute findings except for some gallstones.  Clinically no signs of cholecystitis on CT but given that he was having some pain somewhat in that area we will get an ultrasound to further evaluate.  COVID flu RSV test is negative.  Symptomatic support been provided.  Will get right upper quadrant ultrasound awaiting troponin.  Will reevaluate.  Overall patient seems to be improving.  He was able to ambulate but somewhat unsteady which I suspect from the alcohol and some the Ativan  he is had here.  Ultimately will add an MRI to further rule out stroke.  Will get an ultrasound to further rule out any acute issues with his gallbladder or liver.  Will have oncoming ED staff reevaluate him for need for admission versus discharge.  I think he can be discharged if remaining workup is unremarkable.  CIWA  score at the latest was 6.  This chart was dictated using voice recognition software.  Despite best efforts to proofread,  errors can occur which can change the documentation meaning.      Final diagnoses:  ETOH abuse  Weakness  Dehydration    ED Discharge Orders     None          Ruthe Cornet, DO 12/16/24 1445  "

## 2024-12-16 NOTE — H&P (Cosign Needed Addendum)
 " Date: 12/16/2024               Patient Name:  Carl Briggs MRN: [id number]  DOB: 07-14-1971 Age / Sex: 53 y.o., male   PCP: Heddy Barren, DO         Medical Service: Internal Medicine Teaching Service         Attending Physician: Dr. Jone Dauphin      First Contact: Armando Rossetti, MD}    Second Contact: Dr. Missy Sandhoff, MD         Pager Information: First Contact Pager: [phone]   Second Contact Pager: [phone]   SUBJECTIVE   Chief Complaint: Generalized weakness  History of Present Illness: Carl Briggs is a 53 y.o. 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  He had been abstinent from alcohol for approximately three years prior to this admission. On Monday, he resumed alcohol use and drank heavily for three consecutive days, consuming approximately one bottle of tequila and twelve beers total, with his last drink on 12/15/2024 at approximately 6:00 PM. During this time, he reports loss of appetite with minimal oral intake, but denies chest pain, shortness of breath, abdominal pain, nausea, vomiting, fever, chills, diarrhea, constipation, or urinary symptoms.  On the morning of presentation, he awoke feeling generally unwell, with imbalance and shivering/tremulousness. At that time, he denied focal weakness, dizziness, chest pain, or shortness of breath. Family members denied observing facial drooping, facial twitching, or focal neurologic deficits at home.  Upon EMS arrival, there was concern for possible seizure activity, and the patient subsequently developed transient compressive chest pain and mild shortness of breath, both of which have since resolved. In the ED, right-sided facial twitching was reportedly observed by the ED provider, though this was not appreciated on subsequent  examinations, and family members did not witness this. He currently denies chest pain, shortness of breath, headache, dizziness, vision changes, abdominal pain, nausea, or vomiting..    ED Course: Labs significant for: mild hypokalemia (K 3.3), mildly elevated alkaline phosphatase (157), elevated ethanol level (125), hemoconcentration; no leukocytosis Imaging: CT head and MRI brain without acute intracranial abnormality CT chest/abdomen/pelvis showing cholelithiasis without CT evidence of acute cholecystitis RUQ ultrasound with cholelithiasis, mild gallbladder wall thickening, and positive sonographic Murphy sign Received: IV fluids, lorazepam , ondansetron , pantoprazole  Consulted: General Surgery (no acute surgical intervention recommended)  Meds:  Patient reported:   Active Medications[1]  Past Medical History Hypertension Hyperlipidemia Anxiety Prior GI bleed (2019)  Past Surgical History Open right colectomy (07/25/2023) Colonoscopy with propofol  (11/09/2018) Ileostomy closure (04/13/2024) Parastomal hernia repair (04/13/2024) Left heart catheterization and coronary angiography (07/23/2023)     Past Surgical History:  Procedure Laterality Date   COLECTOMY N/A 07/25/2023   Procedure: OPEN  RIGHT COLECTOMY;  Surgeon: Sebastian Moles, MD;  Location: [facility] OR;  Service: General;  Laterality: N/A;   COLONOSCOPY WITH PROPOFOL  N/A 11/09/2018   Procedure: COLONOSCOPY WITH PROPOFOL ;  Surgeon: Burnette Fallow, MD;  Location: [facility] ENDOSCOPY;  Service: Endoscopy;  Laterality: N/A;   ILEOSTOMY CLOSURE N/A 04/13/2024   Procedure: CLOSURE, ILEOSTOMY;  Surgeon: Sebastian Moles, MD;  Location: [facility] OR;  Service: General;  Laterality: N/A;   LEFT HEART CATH AND CORONARY ANGIOGRAPHY N/A 07/23/2023   Procedure: LEFT HEART CATH AND CORONARY ANGIOGRAPHY;  Surgeon: Verlin Lonni BIRCH, MD;  Location: [facility] INVASIVE CV LAB;  Service: Cardiovascular;  Laterality: N/A;  PARASTOMAL HERNIA REPAIR N/A 04/13/2024    Procedure: REPAIR, HERNIA, PARASTOMAL;  Surgeon: Sebastian Moles, MD;  Location: [facility] OR;  Service: General;  Laterality: N/A;     Social:  Lives with: Family Occupation: Holiday Representative Support: Family Level of Function: Independent at baseline PCP: Education Officer, Community, Roya, DO Tobacco: Never Alcohol: Recent relapse after 3 years of abstinence Recreational drugs: Denies current use  Family History:  Family History  Problem Relation Age of Onset   Diabetes Mother      Allergies: Allergies as of 12/16/2024   (No Known Allergies)    Review of Systems: A complete ROS was negative except as per HPI.   OBJECTIVE:   Physical Exam: Blood pressure (!) 125/92, pulse (!) 108, temperature 97.9 F (36.6 C), temperature source Oral, resp. rate 15, height 5' 2 (1.575 m), weight 77 kg, SpO2 100%.  Constitutional: well-appearing male sitting in bed, in no acute distress HENT: normocephalic, atraumatic; mucous membranes moist Eyes: conjunctiva non-erythematous Neck: supple Cardiovascular: regular rate and rhythm, no murmurs, rubs, or gallops Pulmonary: normal work of breathing on room air, lungs clear to auscultation bilaterally Abdominal: soft, non-tender, non-distended MSK: normal bulk and tone Neurologic: alert and oriented 3, 5/5 strength in bilateral upper and lower extremities, normal gait Skin: warm and dry Psych: calm, cooperative, normal mood and affect   Imaging: RUQ ultrasound: cholelithiasis with mild gallbladder wall thickening and positive sonographic Murphy sign CT chest/abdomen/pelvis: cholelithiasis without CT evidence of acute cholecystitis CT head / MRI brain: no acute intracranial abnormality CXR: vascular congestion without overt pulmonary edema  Labs: CBC    Component Value Date/Time   WBC 6.9 12/16/2024 1105   RBC 5.61 12/16/2024 1105   HGB 17.3 (H) 12/16/2024 1141   HGB 12.5 (L) 09/26/2023 1031   HCT 51.0 12/16/2024 1141   HCT 39.3 09/26/2023 1031   PLT  236 12/16/2024 1105   PLT 296 09/26/2023 1031   MCV 86.3 12/16/2024 1105   MCV 90 09/26/2023 1031   MCH 30.1 12/16/2024 1105   MCHC 34.9 12/16/2024 1105   RDW 14.3 12/16/2024 1105   RDW 15.1 09/26/2023 1031   LYMPHSABS 3.5 12/16/2024 1105   LYMPHSABS 1.6 12/14/2018 1545   MONOABS 0.4 12/16/2024 1105   EOSABS 0.0 12/16/2024 1105   EOSABS 0.1 12/14/2018 1545   BASOSABS 0.0 12/16/2024 1105   BASOSABS 0.0 12/14/2018 1545     CMP     Component Value Date/Time   NA 140 12/16/2024 1141   NA 139 09/26/2023 1031   K 3.3 (L) 12/16/2024 1141   CL 100 12/16/2024 1141   CO2 24 12/16/2024 1105   GLUCOSE 111 (H) 12/16/2024 1141   BUN 7 12/16/2024 1141   BUN 10 09/26/2023 1031   CREATININE 0.80 12/16/2024 1141   CALCIUM  8.7 (L) 12/16/2024 1105   PROT 8.1 12/16/2024 1105   PROT 7.3 09/26/2023 1031   ALBUMIN  4.5 12/16/2024 1105   ALBUMIN  3.9 09/26/2023 1031   AST 40 12/16/2024 1105   ALT 32 12/16/2024 1105   ALKPHOS 157 (H) 12/16/2024 1105   BILITOT 0.8 12/16/2024 1105   BILITOT 0.3 09/26/2023 1031   GFRNONAA >60 12/16/2024 1105   GFRAA 118 12/14/2018 1545    Imaging:  US  Abdomen Limited RUQ (LIVER/GB) Result Date: 12/16/2024 CLINICAL DATA:  Pain. EXAM: ULTRASOUND ABDOMEN LIMITED RIGHT UPPER QUADRANT COMPARISON:  July 24, 2020 FINDINGS: Gallbladder: Multiple shadowing, echogenic gallstones are seen within the gallbladder lumen. The largest measures approximately 1.3 cm. Additional 6.0 mm x 2.7 mm  x 5.1 mm nonshadowing echogenic focus is seen along the nondependent wall of the gallbladder. There is mild gallbladder wall thickening (3.7 mm). A positive sonographic Beverley sign is noted by sonographer. Common bile duct: Diameter: 5.7 mm Liver: No focal lesion identified. There is diffusely increased echogenicity of the liver parenchyma. Portal vein is patent on color Doppler imaging with normal direction of blood flow towards the liver. Other: None. IMPRESSION: 1. Cholelithiasis with  additional findings consistent with acute cholecystitis. 2. Findings suggestive of a small, likely benign gallbladder polyp. No follow-up imaging is recommended. This recommendation follows ACR consensus guidelines: White Paper of the ACR Incidental Findings Committee II on Gallbladder and Biliary Findings. J Am Coll Radiol 2013:;10:953-956. 3. Hepatic steatosis. Electronically Signed   By: Suzen Dials M.D.   On: 12/16/2024 15:12   CT CHEST ABDOMEN PELVIS W CONTRAST Result Date: 12/16/2024 EXAM: CT CHEST, ABDOMEN AND PELVIS WITH CONTRAST 12/16/2024 12:45:25 PM TECHNIQUE: CT of the chest, abdomen and pelvis was performed with the administration of 75 mL of iohexol  (OMNIPAQUE ) 350 MG/ML injection. Multiplanar reformatted images are provided for review. Automated exposure control, iterative reconstruction, and/or weight based adjustment of the mA/kV was utilized to reduce the radiation dose to as low as reasonably achievable. COMPARISON: CT angiography chest 09/06/2023, CT abdomen and pelvis 01/26/2023, CT abdomen and pelvis 09/21/2020. CLINICAL HISTORY: Sepsis. FINDINGS: CHEST: MEDIASTINUM AND LYMPH NODES: Heart and pericardium are unremarkable. The central airways are clear. Calcified prevascular lymph node. Calcified left hilar lymph node. The main pulmonary artery is normal in caliber. No central or proximal segmental pulmonary embolus. Limited evaluation more distally due to timing of contrast. The thoracic aorta is normal in caliber. LUNGS AND PLEURA: Lobe mild architectural distortion and scarring. Bibasilar dependent atelectasis. There is a chronic stable centrally calcified anterior left upper lobe 1.1 x 1.3 cm pulmonary nodule consistent with granulomata. Interval development of a right middle lobe subpleural 4 mm solid micronodule along the minor fissure. No pleural effusion or pneumothorax. ABDOMEN AND PELVIS: LIVER: Diffusely hypodense hepatic parenchyma compared to the spleen. There is a right  hepatic dome enhancing lesion mass measuring 1.4 x 1.5 cm that is consistent with a stable known hepatic hemangioma. GALLBLADDER AND BILE DUCTS: Calcified gallstones within the gallbladder lumen. 4 mm calcified gallstone within the cystic duct. No cholelithiasis of the common bile duct. No associated gallbladder wall thickening or pericholecystic fluid. No biliary ductal dilatation. SPLEEN: No acute abnormality. PANCREAS: No acute abnormality. ADRENAL GLANDS: No acute abnormality. KIDNEYS, URETERS AND BLADDER: Fluid attenuation of the right kidney likely represents a simple renal cyst. Simple renal cysts do not require additional follow-up unless clinically indicated due to signs/symptoms. No stones in the kidneys or ureters. No hydronephrosis. No perinephric or periureteral stranding. Urinary bladder is unremarkable. GI AND BOWEL: Stomach demonstrates no acute abnormality. No small or large bowel thickening or dilatation. There is no bowel obstruction. Status post appendectomy. Colonic diverticulosis. REPRODUCTIVE ORGANS: The prostate is prominent in size, measuring up to 4.9 cm. PERITONEUM AND RETROPERITONEUM: No ascites. No free air. VASCULATURE: Aorta is normal in caliber. ABDOMINAL AND PELVIS LYMPH NODES: No lymphadenopathy. BONES AND SOFT TISSUES: Diastasis rectus. No definite abdominal wall hernia. No inguinal hernia. No acute osseous abnormality. No focal soft tissue abnormality. IMPRESSION: 1. No acute findings in the chest, abdomen, and pelvis related to sepsis. 2. Calcified gallstones within the gallbladder lumen and cystic duct, without CT findings of acute cholecystitis or choledocholithiasis of the common bile duct. 3. New 4 mm  right middle lobe solid pulmonary micronodule, with no routine follow-up imaging recommended as per Fleischner Society Guidelines. 4. Sequelae of prior granulomatous disease of the right lung. 5. Other, non-acute and/or normal findings as above. Electronically signed by: Morgane  Naveau MD 12/16/2024 01:18 PM EST RP Workstation: [id number]   CT Head Wo Contrast Result Date: 12/16/2024 EXAM: CT HEAD WITHOUT CONTRAST 12/16/2024 12:45:25 PM TECHNIQUE: CT of the head was performed without the administration of intravenous contrast. Automated exposure control, iterative reconstruction, and/or weight based adjustment of the mA/kV was utilized to reduce the radiation dose to as low as reasonably achievable. COMPARISON: CT head 07/20/2023. CLINICAL HISTORY: 53 year old male with sepsis, altered mental status. FINDINGS: BRAIN AND VENTRICLES: No acute hemorrhage. No evidence of acute infarct. No hydrocephalus. No extra-axial collection. No mass effect or midline shift. Brain volume remains normal for age. Stable and normal gray white differentiation for age. No suspicious intracranial vascular hyperdensity. ORBITS: No acute abnormality. SINUSES: Scattered paranasal sinus mucosal thickening and small retention cysts. Aeration appears stable to improved from last year. SOFT TISSUES AND SKULL: Chronic left forehead and vertex scalp soft tissue scarring. Tympanic cavities and mastoids appear well aerated. No skull fracture. IMPRESSION: 1. Normal for age non-contrast CT appearance of the brain. Electronically signed by: Helayne Hurst MD 12/16/2024 12:57 PM EST RP Workstation: [id number]   DG Chest Portable 1 View Result Date: 12/16/2024 CLINICAL DATA:  Shortness of breath. EXAM: PORTABLE CHEST 1 VIEW COMPARISON:  09/06/2023 FINDINGS: Low volume film. The cardio pericardial silhouette is enlarged. There is pulmonary vascular congestion without overt pulmonary edema. No acute bony abnormality. Telemetry leads overlie the chest. IMPRESSION: Low volume film with vascular congestion. Electronically Signed   By: Camellia Candle M.D.   On: 12/16/2024 11:35     EKG: personally reviewed my interpretation issinus tachycardia without acute ischemic changes.  ASSESSMENT & PLAN:   Assessment & Plan by  Problem: Active Problems:   * No active hospital problems. *  Carl Briggs is a 53 year old male with a history of hypertension, prior major abdominal surgery, and alcohol use disorder in remission, who presented with generalized weakness and imbalance in the setting of acute alcohol relapse and poor oral intake, now admitted for further management.  #Alcohol relapse with concern for early alcohol withdrawal The patient had been abstinent from alcohol for approximately three years before resuming heavy alcohol intake for three consecutive days, consuming one bottle of tequila and multiple beers. He presented with tremulousness, shivering, and imbalance, which improved after lorazepam  administration in the ED. CIWA scores have remained low, and he has no history of withdrawal seizures or delirium tremens. Overall presentation is consistent with early, mild alcohol withdrawal, compounded by poor oral intake and dehydration. Plan: - CIWA monitoring q4h - Lorazepam  PRN per CIWA protocol - Thiamine  100 mg daily - Folic acid  1 mg daily - Multivitamin daily - Counsel on alcohol cessation - Social work for substance use resources  #Generalized weakness and imbalance The patient developed acute imbalance and generalized weakness on the morning of presentation. Neurologic exam is nonfocal, strength is intact, and gait is normal on exam. CT head and MRI brain show no acute intracranial pathology. Symptoms are most consistent with a multifactorial process, including dehydration, poor nutrition, acute alcohol effects, and benzodiazepine exposure rather than a primary neurologic etiology. Plan: - IV fluids for volume repletion - Encourage oral intake as tolerated - Electrolyte monitoring and repletion (K, Mg, Phos) - Fall precautions  #Cholelithiasis with equivocal  findings for acute cholecystitis RUQ ultrasound demonstrated cholelithiasis with mild gallbladder wall thickening and a positive  sonographic Murphy sign; however, CT abdomen/pelvis showed no gallbladder wall thickening, pericholecystic fluid, or biliary dilation, and the patient has no abdominal pain, leukocytosis, fever, or LFT abnormalities. General Surgery evaluated the patient and felt the presentation was not consistent with acute surgical cholecystitis at this time. Plan: - surgery consult  - Monitor abdominal exam - Advance diet as tolerated  #Chest pain, resolved The patient experienced transient compressive chest pain with mild shortness of breath during EMS evaluation, which resolved spontaneously. He is currently asymptomatic. Troponins and imaging are reassuring, EKG shows sinus tachycardia without ischemic changes, and prior echocardiogram demonstrated normal EF. Overall, this episode is most consistent with stress-related or withdrawal-associated chest discomfort, rather than acute coronary syndrome. Plan: - Telemetry monitoring - Trend troponins if clinically indicated  #Dehydration and poor oral intake The patient reports minimal food intake during the three days of alcohol consumption. Labs show hemoconcentration and elevated urine specific gravity, supporting dehydration. This likely contributed to weakness, imbalance, and tachycardia. Plan: - IV normal saline - Encourage oral intake - Daily BMP - Monitor urine output  #Mild electrolyte abnormalities Labs notable for mild hypokalemia and borderline low calcium , likely secondary to poor intake and alcohol use. No arrhythmias or symptoms attributable to electrolyte derangements at this time. Plan: - Replete potassium per protocol - Monitor Mg and Phos daily R- eplete electrolytes as needed  #History of prior major abdominal surgery The patient has a history of open right colectomy with diverting ileostomy, followed by ileostomy closure and parastomal hernia repair earlier this year. Abdomen is currently soft, non-tender, and without signs of  obstruction or surgical complication. Plan: - Routine abdominal monitoring -No acute intervention required  Best practice: Diet: CLD, NPO at midnight VTE: Enoxaparin  IVF: None,None Code: Full  Disposition planning: Prior to Admission Living Arrangement: Home, living   Anticipated Discharge Location: Home  Dispo: Admit patient to Inpatient with expected length of stay greater than 2 midnights.  Signed: Nhan Qualley, MD Internal Medicine Resident  12/16/2024, 4:32 PM  On Call pager: [phone]      [1]  No outpatient medications have been marked as taking for the 12/16/24 encounter [facility] Encounter).   "

## 2024-12-16 NOTE — TOC CM/SW Note (Signed)
 TOC consult received for substance abuse. Follow-up to be completed with patient as appropriate.    Merilee Batty, MSN, RN Case Management [phone]

## 2024-12-16 NOTE — ED Triage Notes (Signed)
 Pt BIB [facility] from home due to high blood pressure and weakness.  Pt had not drank for the past 3 years and now has been drinking the past 3 days.  Pt reports bilateral leg weakness.  Family states that he has left sided facial droop.  LKW 12/14/2024.  VS BP 210/120, HR 120, CBG 271

## 2024-12-16 NOTE — Hospital Course (Addendum)
 1. Acute cholecystitis / Cholelithiasis The patient was found to have cholelithiasis on CT imaging, with right upper quadrant ultrasound demonstrating gallstones, mild gallbladder wall thickening, and a positive sonographic Murphy sign, concerning for evolving acute cholecystitis. General Surgery was consulted. Given ongoing concern for biliary pathology, the patient underwent laparoscopic cholecystectomy on 12/17/2024, with attempted intraoperative cholangiogram. Postoperatively, he recovered without complications. He tolerated diet advancement, had good pain control, and abdominal exams remained benign. Postoperative liver enzymes and bilirubin showed mild transient elevation but improved and did not require further intervention. He was deemed surgically stable for discharge.  2.  Alcohol consumption binge drinking The patient presented after a three-day alcohol relapse following approximately three years of abstinence, with symptoms of tremulousness, anxiety, and generalized weakness. Given recent alcohol use, he was monitored on the CIWA protocol for possible early alcohol withdrawal. However, CIWA scores remained low throughout hospitalization, and he did not develop seizures, hallucinations, autonomic instability, or delirium tremens. His symptoms improved rapidly with supportive care, including IV fluids, electrolyte repletion, and vitamin supplementation, with minimal benzodiazepine requirement. Overall, his clinical course was felt to be more consistent with acute alcohol intoxication and dehydration rather than true alcohol withdrawal.  3. Generalized weakness and imbalance The patient presented with acute generalized weakness and imbalance. Neurologic evaluation was nonfocal, and CT head as well as MRI brain showed no acute intracranial pathology. His symptoms were felt to be multifactorial, related to dehydration, poor oral intake, and acute alcohol effects. Symptoms improved with IV fluids,  nutritional support, and electrolyte repletion. He was ambulating independently prior to discharge.  4. Dehydration and poor oral intake The patient reported minimal oral intake during his alcohol relapse and was found to have hemoconcentration on admission labs. He was treated with intravenous fluids and gradual advancement of diet. Volume status improved, and he was tolerating oral intake at the time of discharge.  5. Electrolyte abnormalities Initial labs demonstrated mild hypokalemia and borderline hypocalcemia, likely secondary to poor intake and alcohol use. Electrolytes were repleted as needed and normalized prior to discharge.  6. Transient chest pain, resolved The patient experienced brief, self-limited chest pain during EMS transport, which resolved prior to admission. EKG showed sinus tachycardia without ischemic changes, and cardiac workup was reassuring. No further episodes occurred during hospitalization, and no additional cardiac evaluation was required.  7. Hypertension The patient has a known history of hypertension. Blood pressures were mildly elevated during hospitalization but remained stable without evidence of hypertensive urgency or end-organ damage. Home lisinopril  was continued.

## 2024-12-17 ENCOUNTER — Encounter (HOSPITAL_COMMUNITY): Payer: Self-pay

## 2024-12-17 ENCOUNTER — Encounter (HOSPITAL_COMMUNITY)
Admission: EM | Disposition: A | Discharge status: Home / Self Care | Payer: Self-pay | Source: Home / Self Care | Attending: Emergency Medicine

## 2024-12-17 ENCOUNTER — Observation Stay (HOSPITAL_COMMUNITY): Payer: Self-pay | Admitting: Anesthesiology

## 2024-12-17 ENCOUNTER — Observation Stay (HOSPITAL_COMMUNITY): Payer: Self-pay

## 2024-12-17 DIAGNOSIS — E860 Dehydration: Secondary | ICD-10-CM

## 2024-12-17 DIAGNOSIS — F10139 Alcohol abuse with withdrawal, unspecified: Secondary | ICD-10-CM

## 2024-12-17 DIAGNOSIS — K8020 Calculus of gallbladder without cholecystitis without obstruction: Secondary | ICD-10-CM

## 2024-12-17 DIAGNOSIS — Z79899 Other long term (current) drug therapy: Secondary | ICD-10-CM

## 2024-12-17 DIAGNOSIS — I10 Essential (primary) hypertension: Secondary | ICD-10-CM

## 2024-12-17 HISTORY — PX: CHOLECYSTECTOMY: SHX55

## 2024-12-17 LAB — COMPREHENSIVE METABOLIC PANEL WITH GFR
ALT: 25 U/L (ref 0–44)
AST: 31 U/L (ref 15–41)
Albumin: 3.7 g/dL (ref 3.5–5.0)
Alkaline Phosphatase: 133 U/L — ABNORMAL HIGH (ref 38–126)
Anion gap: 11 (ref 5–15)
BUN: 10 mg/dL (ref 6–20)
CO2: 24 mmol/L (ref 22–32)
Calcium: 8.7 mg/dL — ABNORMAL LOW (ref 8.9–10.3)
Chloride: 102 mmol/L (ref 98–111)
Creatinine, Ser: 0.76 mg/dL (ref 0.61–1.24)
GFR, Estimated: >60 mL/min
Glucose, Bld: 86 mg/dL (ref 70–99)
Potassium: 4.1 mmol/L (ref 3.5–5.1)
Sodium: 136 mmol/L (ref 135–145)
Total Bilirubin: 1.6 mg/dL — ABNORMAL HIGH (ref 0.0–1.2)
Total Protein: 6.4 g/dL — ABNORMAL LOW (ref 6.5–8.1)

## 2024-12-17 LAB — CBC
HCT: 45.5 % (ref 39.0–52.0)
Hemoglobin: 15 g/dL (ref 13.0–17.0)
MCH: 29.9 pg (ref 26.0–34.0)
MCHC: 33 g/dL (ref 30.0–36.0)
MCV: 90.8 fL (ref 80.0–100.0)
Platelets: 160 K/uL (ref 150–400)
RBC: 5.01 MIL/uL (ref 4.22–5.81)
RDW: 14.5 % (ref 11.5–15.5)
WBC: 5.4 K/uL (ref 4.0–10.5)
nRBC: 0 % (ref 0.0–0.2)

## 2024-12-17 LAB — HIV ANTIBODY (ROUTINE TESTING W REFLEX): HIV Screen 4th Generation wRfx: NONREACTIVE

## 2024-12-17 LAB — TSH: TSH: 1.95 u[IU]/mL (ref 0.350–4.500)

## 2024-12-17 SURGERY — LAPAROSCOPIC CHOLECYSTECTOMY WITH INTRAOPERATIVE CHOLANGIOGRAM
Anesthesia: General

## 2024-12-17 MED ORDER — DROPERIDOL 2.5 MG/ML IJ SOLN: 0.6250 mg | Freq: Once | INTRAMUSCULAR | Status: DC | PRN

## 2024-12-17 MED ORDER — SENNA 8.6 MG PO TABS
1.0000 | ORAL_TABLET | Freq: Two times a day (BID) | ORAL | Status: DC
  Administered 2024-12-17 – 2024-12-18 (×2): 8.6 mg via ORAL
  Filled 2024-12-17 (×2): qty 1

## 2024-12-17 MED ORDER — HYDROMORPHONE HCL 1 MG/ML IJ SOLN: 0.2500 mg | INTRAMUSCULAR | Status: DC | PRN

## 2024-12-17 MED ORDER — MIDAZOLAM HCL (PF) 2 MG/2ML IJ SOLN
INTRAMUSCULAR | Status: DC | PRN
  Administered 2024-12-17: 2 mg via INTRAVENOUS

## 2024-12-17 MED ORDER — CALCIUM CARBONATE ANTACID 500 MG PO CHEW: 1.0000 | CHEWABLE_TABLET | Freq: Two times a day (BID) | ORAL | Status: DC

## 2024-12-17 MED ORDER — PHENYLEPHRINE 80 MCG/ML (10ML) SYRINGE FOR IV PUSH (FOR BLOOD PRESSURE SUPPORT)
PREFILLED_SYRINGE | INTRAVENOUS | Status: DC | PRN
  Administered 2024-12-17: 160 ug via INTRAVENOUS

## 2024-12-17 MED ORDER — ONDANSETRON HCL 4 MG/2ML IJ SOLN
4.0000 mg | Freq: Two times a day (BID) | INTRAMUSCULAR | Status: DC | PRN
  Filled 2024-12-17: qty 2

## 2024-12-17 MED ORDER — BUPIVACAINE-EPINEPHRINE (PF) 0.25% -1:200000 IJ SOLN
INTRAMUSCULAR | Status: AC
  Filled 2024-12-17: qty 30

## 2024-12-17 MED ORDER — MIDAZOLAM HCL 2 MG/2ML IJ SOLN
INTRAMUSCULAR | Status: AC
  Filled 2024-12-17: qty 2

## 2024-12-17 MED ORDER — ONDANSETRON HCL 4 MG/2ML IJ SOLN: 4.0000 mg | Freq: Once | INTRAMUSCULAR | Status: DC | PRN

## 2024-12-17 MED ORDER — CHLORHEXIDINE GLUCONATE 0.12 % MT SOLN
15.0000 mL | Freq: Once | OROMUCOSAL | Status: AC
  Administered 2024-12-17: 15 mL via OROMUCOSAL

## 2024-12-17 MED ORDER — PANTOPRAZOLE SODIUM 40 MG IV SOLR
40.0000 mg | Freq: Every day | INTRAVENOUS | Status: DC
  Administered 2024-12-17 – 2024-12-18 (×2): 40 mg via INTRAVENOUS
  Filled 2024-12-17 (×2): qty 10

## 2024-12-17 MED ORDER — PROPOFOL 10 MG/ML IV BOLUS
INTRAVENOUS | Status: DC | PRN
  Administered 2024-12-17: 150 mg via INTRAVENOUS

## 2024-12-17 MED ORDER — OXYCODONE HCL 5 MG PO TABS: 5.0000 mg | ORAL_TABLET | ORAL | 0 refills | Status: DC | PRN

## 2024-12-17 MED ORDER — BUPIVACAINE HCL (PF) 0.25 % IJ SOLN
INTRAMUSCULAR | Status: DC | PRN
  Administered 2024-12-17: 15 mL

## 2024-12-17 MED ORDER — MAGNESIUM SULFATE 4 GM/100ML IV SOLN
4.0000 g | Freq: Once | INTRAVENOUS | Status: AC
  Administered 2024-12-17: 4 g via INTRAVENOUS
  Filled 2024-12-17: qty 100

## 2024-12-17 MED ORDER — FAMOTIDINE 20 MG PO TABS
20.0000 mg | ORAL_TABLET | Freq: Two times a day (BID) | ORAL | Status: DC
  Administered 2024-12-17 – 2024-12-18 (×2): 20 mg via ORAL
  Filled 2024-12-17 (×2): qty 1

## 2024-12-17 MED ORDER — ROCURONIUM BROMIDE 10 MG/ML (PF) SYRINGE
PREFILLED_SYRINGE | INTRAVENOUS | Status: DC | PRN
  Administered 2024-12-17: 50 mg via INTRAVENOUS

## 2024-12-17 MED ORDER — LACTATED RINGERS IV SOLN: INTRAVENOUS | Status: DC

## 2024-12-17 MED ORDER — FENTANYL CITRATE (PF) 250 MCG/5ML IJ SOLN
INTRAMUSCULAR | Status: AC
  Filled 2024-12-17: qty 5

## 2024-12-17 MED ORDER — OXYCODONE HCL 5 MG PO TABS
10.0000 mg | ORAL_TABLET | ORAL | Status: DC | PRN
  Administered 2024-12-17 – 2024-12-18 (×3): 10 mg via ORAL
  Filled 2024-12-17 (×3): qty 2

## 2024-12-17 MED ORDER — ONDANSETRON HCL 4 MG/2ML IJ SOLN
INTRAMUSCULAR | Status: DC | PRN
  Administered 2024-12-17: 4 mg via INTRAVENOUS

## 2024-12-17 MED ORDER — HYDROMORPHONE HCL 1 MG/ML IJ SOLN
INTRAMUSCULAR | Status: AC
  Filled 2024-12-17: qty 0.5

## 2024-12-17 MED ORDER — ENOXAPARIN SODIUM 40 MG/0.4ML IJ SOSY
40.0000 mg | PREFILLED_SYRINGE | INTRAMUSCULAR | Status: DC
  Administered 2024-12-18: 40 mg via SUBCUTANEOUS
  Filled 2024-12-17: qty 0.4

## 2024-12-17 MED ORDER — ALUM & MAG HYDROXIDE-SIMETH 200-200-20 MG/5ML PO SUSP
30.0000 mL | Freq: Once | ORAL | Status: AC
  Administered 2024-12-17: 30 mL via ORAL
  Filled 2024-12-17: qty 30

## 2024-12-17 MED ORDER — POLYETHYLENE GLYCOL 3350 17 G PO PACK: 17.0000 g | PACK | Freq: Every day | ORAL | 1 refills | Status: DC

## 2024-12-17 MED ORDER — ORAL CARE MOUTH RINSE: 15.0000 mL | Freq: Once | OROMUCOSAL | Status: AC

## 2024-12-17 MED ORDER — ACETAMINOPHEN 500 MG PO TABS: 1000.0000 mg | ORAL_TABLET | Freq: Four times a day (QID) | ORAL | 3 refills | Status: DC

## 2024-12-17 MED ORDER — SUCCINYLCHOLINE CHLORIDE 200 MG/10ML IV SOSY
PREFILLED_SYRINGE | INTRAVENOUS | Status: DC | PRN
  Administered 2024-12-17: 120 mg via INTRAVENOUS

## 2024-12-17 MED ORDER — SUGAMMADEX SODIUM 200 MG/2ML IV SOLN
INTRAVENOUS | Status: DC | PRN
  Administered 2024-12-17: 200 mg via INTRAVENOUS

## 2024-12-17 MED ORDER — INDOCYANINE GREEN 25 MG IJ SOLR
1.2500 mg | INTRAMUSCULAR | Status: DC
  Filled 2024-12-17: qty 10

## 2024-12-17 MED ORDER — FENTANYL CITRATE (PF) 250 MCG/5ML IJ SOLN
INTRAMUSCULAR | Status: DC | PRN
  Administered 2024-12-17: 100 ug via INTRAVENOUS
  Administered 2024-12-17: 150 ug via INTRAVENOUS

## 2024-12-17 MED ORDER — METHOCARBAMOL 750 MG PO TABS: 750.0000 mg | ORAL_TABLET | Freq: Four times a day (QID) | ORAL | 1 refills | Status: DC

## 2024-12-17 MED ORDER — INDOCYANINE GREEN 25 MG IJ SOLR
INTRAMUSCULAR | Status: DC | PRN
  Administered 2024-12-17: 1.25 mg via INTRAVENOUS

## 2024-12-17 MED ORDER — HYDROMORPHONE HCL 1 MG/ML IJ SOLN
INTRAMUSCULAR | Status: DC | PRN
  Administered 2024-12-17: .5 mg via INTRAVENOUS

## 2024-12-17 MED ORDER — LIDOCAINE 2% (20 MG/ML) 5 ML SYRINGE
INTRAMUSCULAR | Status: DC | PRN
  Administered 2024-12-17: 80 mg via INTRAVENOUS

## 2024-12-17 MED ORDER — IBUPROFEN 600 MG PO TABS: 600.0000 mg | ORAL_TABLET | Freq: Four times a day (QID) | ORAL | 1 refills | Status: DC | PRN

## 2024-12-17 MED ORDER — POLYETHYLENE GLYCOL 3350 17 G PO PACK
17.0000 g | PACK | Freq: Every day | ORAL | Status: DC
  Administered 2024-12-17 – 2024-12-18 (×2): 17 g via ORAL
  Filled 2024-12-17 (×2): qty 1

## 2024-12-17 MED ORDER — DEXMEDETOMIDINE HCL IN NACL 80 MCG/20ML IV SOLN
INTRAVENOUS | Status: DC | PRN
  Administered 2024-12-17 (×2): 8 ug via INTRAVENOUS
  Administered 2024-12-17: 4 ug via INTRAVENOUS

## 2024-12-17 MED ORDER — DEXAMETHASONE SOD PHOSPHATE PF 10 MG/ML IJ SOLN
INTRAMUSCULAR | Status: DC | PRN
  Administered 2024-12-17: 10 mg via INTRAVENOUS

## 2024-12-17 SURGICAL SUPPLY — 35 items
BAG COUNTER SPONGE SURGICOUNT (BAG) ×1 IMPLANT
BLADE CLIPPER SURG (BLADE) IMPLANT
CANISTER SUCTION 3000ML PPV (SUCTIONS) ×1 IMPLANT
CHLORAPREP W/TINT 26 (MISCELLANEOUS) ×1 IMPLANT
CLIP APPLIE 5 13 M/L LIGAMAX5 (MISCELLANEOUS) ×1 IMPLANT
COVER MAYO STAND STRL (DRAPES) IMPLANT
COVER SURGICAL LIGHT HANDLE (MISCELLANEOUS) ×1 IMPLANT
DERMABOND ADVANCED .7 DNX12 (GAUZE/BANDAGES/DRESSINGS) ×1 IMPLANT
DISSECTOR BLUNT TIP ENDO 5MM (MISCELLANEOUS) ×1 IMPLANT
DRAPE C-ARM 42X120 X-RAY (DRAPES) IMPLANT
ELECTRODE REM PT RTRN 9FT ADLT (ELECTROSURGICAL) ×1 IMPLANT
GLOVE BIO SURGEON STRL SZ 6.5 (GLOVE) ×1 IMPLANT
GLOVE BIOGEL PI IND STRL 6 (GLOVE) ×1 IMPLANT
GOWN STRL REUS W/ TWL LRG LVL3 (GOWN DISPOSABLE) ×3 IMPLANT
IRRIGATION SUCT STRKRFLW 2 WTP (MISCELLANEOUS) IMPLANT
KIT BASIN OR (CUSTOM PROCEDURE TRAY) ×1 IMPLANT
KIT IMAGING PINPOINTPAQ (MISCELLANEOUS) IMPLANT
KIT TURNOVER KIT B (KITS) ×1 IMPLANT
PAD ARMBOARD POSITIONER FOAM (MISCELLANEOUS) ×1 IMPLANT
SCISSORS LAP 5X35 DISP (ENDOMECHANICALS) ×1 IMPLANT
SET CHOLANGIOGRAPH 5 50 .035 (SET/KITS/TRAYS/PACK) IMPLANT
SET TUBE SMOKE EVAC HIGH FLOW (TUBING) ×1 IMPLANT
SLEEVE Z-THREAD 5X100MM (TROCAR) ×2 IMPLANT
SOLN 0.9% NACL POUR BTL 1000ML (IV SOLUTION) ×1 IMPLANT
SOLN STERILE WATER BTL 1000 ML (IV SOLUTION) ×1 IMPLANT
SUT MNCRL AB 4-0 PS2 18 (SUTURE) ×1 IMPLANT
SUT VIC AB 0 UR5 27 (SUTURE) IMPLANT
SUT VICRYL 0 UR6 27IN ABS (SUTURE) IMPLANT
SYSTEM BAG RETRIEVAL 10MM (BASKET) IMPLANT
TOWEL GREEN STERILE (TOWEL DISPOSABLE) ×1 IMPLANT
TOWEL GREEN STERILE FF (TOWEL DISPOSABLE) ×1 IMPLANT
TRAY LAPAROSCOPIC MC (CUSTOM PROCEDURE TRAY) ×1 IMPLANT
TROCAR BALLN 12MMX100 BLUNT (TROCAR) ×1 IMPLANT
TROCAR Z-THREAD OPTICAL 5X100M (TROCAR) ×1 IMPLANT
WARMER LAPAROSCOPE (MISCELLANEOUS) ×1 IMPLANT

## 2024-12-17 NOTE — Progress Notes (Signed)
 "  HD#0 SUBJECTIVE:  Patient Summary: Carl Briggs is a 53 y.o. 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  Overnight Events: No acute events overnight. CIWA scores remained low (most recent CIWA = 3). No further tremors, hallucinations, or agitation. No abdominal pain or chest pain overnight  Interim History: Patient reports feeling anxious this morning. Endorsed nausea but denied vomiting, abdominal pain, chest pain, shortness of breath, headache, or dizziness. Tolerating clear liquids. Ambulating to bathroom without difficulty.    OBJECTIVE:  Vital Signs: Vitals:   12/16/24 2105 12/16/24 2215 12/17/24 0019 12/17/24 0539  BP: (!) 148/90 (!) 152/96 121/85 (!) 146/91  Pulse: 94 97 83 77  Resp: 18  17 18   Temp: 97.7 F (36.5 C)  97.6 F (36.4 C) 97.7 F (36.5 C)  TempSrc:   Oral   SpO2: 98%  98% 96%  Weight:      Height:       Supplemental O2: Room Air SpO2: 96 %  Filed Weights   12/16/24 1059  Weight: 77 kg     Intake/Output Summary (Last 24 hours) at 12/17/2024 0719 Last data filed at 12/17/2024 0600 Gross per 24 hour  Intake 50 ml  Output 400 ml  Net -350 ml   Net IO Since Admission: -350 mL [12/17/24 0719]  Physical Exam: Physical Exam General: Awake, alert, no acute distress HEENT: Normocephalic, atraumatic, mucous membranes moist CV: Regular rate and rhythm, no murmurs Pulmonary: Clear to auscultation bilaterally, normal work of breathing on room air Abdomen: Soft, non-tender, non-distended, healed surgical scars Extremities: No edema, moves all extremities Neuro: Alert and oriented 3, no focal deficits, steady gait Psych: Calm, cooperative,  appropriate mood and affect Patient Lines/Drains/Airways Status     Active Line/Drains/Airways     Name Placement date Placement time Site Days   Peripheral IV 12/16/24 20 G Right Antecubital 12/16/24  1116  Antecubital  1   Closed System Drain Inferior 07/25/23  1500  --  511            Pertinent labs and imaging:      Latest Ref Rng & Units 12/17/2024    5:22 AM 12/16/2024   11:41 AM 12/16/2024   11:05 AM  CBC  WBC 4.0 - 10.5 K/uL 5.4   6.9   Hemoglobin 13.0 - 17.0 g/dL 84.9  82.6  83.0   Hematocrit 39.0 - 52.0 % 45.5  51.0  48.4   Platelets 150 - 400 K/uL 160   236        Latest Ref Rng & Units 12/17/2024    5:22 AM 12/16/2024   11:41 AM 12/16/2024   11:05 AM  CMP  Glucose 70 - 99 mg/dL 86  888  891   BUN 6 - 20 mg/dL 10  7  8    Creatinine 0.61 - 1.24 mg/dL 9.23  9.19  9.30   Sodium 135 - 145 mmol/L 136  140  138   Potassium 3.5 - 5.1 mmol/L 4.1  3.3  3.6   Chloride 98 - 111 mmol/L 102  100  100   CO2 22 - 32 mmol/L 24   24   Calcium  8.9 - 10.3 mg/dL 8.7   8.7   Total Protein 6.5 -  8.1 g/dL 6.4   8.1   Total Bilirubin 0.0 - 1.2 mg/dL 1.6   0.8   Alkaline Phos 38 - 126 U/L 133   157   AST 15 - 41 U/L 31   40   ALT 0 - 44 U/L 25   32     MR BRAIN WO CONTRAST Result Date: 12/16/2024 EXAM: MRI Brain Without Contrast 12/16/2024 04:24:00 PM TECHNIQUE: Multiplanar multisequence MRI of the head/brain was performed without the administration of intravenous contrast. COMPARISON: None available. CLINICAL HISTORY: Neuro deficit, acute, stroke suspected FINDINGS: BRAIN AND VENTRICLES: No acute infarct. No intracranial hemorrhage. No mass. No midline shift. No hydrocephalus. The sella is unremarkable. Normal flow voids. ORBITS: No acute abnormality. SINUSES AND MASTOIDS: No acute abnormality. BONES AND SOFT TISSUES: Normal marrow signal. No acute soft tissue abnormality. IMPRESSION: 1. No acute intracranial abnormality. Electronically signed by: Gilmore Molt 12/16/2024  04:37 PM EST RP Workstation: [id number]   US  Abdomen Limited RUQ (LIVER/GB) Result Date: 12/16/2024 CLINICAL DATA:  Pain. EXAM: ULTRASOUND ABDOMEN LIMITED RIGHT UPPER QUADRANT COMPARISON:  July 24, 2020 FINDINGS: Gallbladder: Multiple shadowing, echogenic gallstones are seen within the gallbladder lumen. The largest measures approximately 1.3 cm. Additional 6.0 mm x 2.7 mm x 5.1 mm nonshadowing echogenic focus is seen along the nondependent wall of the gallbladder. There is mild gallbladder wall thickening (3.7 mm). A positive sonographic Beverley sign is noted by sonographer. Common bile duct: Diameter: 5.7 mm Liver: No focal lesion identified. There is diffusely increased echogenicity of the liver parenchyma. Portal vein is patent on color Doppler imaging with normal direction of blood flow towards the liver. Other: None. IMPRESSION: 1. Cholelithiasis with additional findings consistent with acute cholecystitis. 2. Findings suggestive of a small, likely benign gallbladder polyp. No follow-up imaging is recommended. This recommendation follows ACR consensus guidelines: White Paper of the ACR Incidental Findings Committee II on Gallbladder and Biliary Findings. J Am Coll Radiol 2013:;10:953-956. 3. Hepatic steatosis. Electronically Signed   By: Suzen Dials M.D.   On: 12/16/2024 15:12   CT CHEST ABDOMEN PELVIS W CONTRAST Result Date: 12/16/2024 EXAM: CT CHEST, ABDOMEN AND PELVIS WITH CONTRAST 12/16/2024 12:45:25 PM TECHNIQUE: CT of the chest, abdomen and pelvis was performed with the administration of 75 mL of iohexol  (OMNIPAQUE ) 350 MG/ML injection. Multiplanar reformatted images are provided for review. Automated exposure control, iterative reconstruction, and/or weight based adjustment of the mA/kV was utilized to reduce the radiation dose to as low as reasonably achievable. COMPARISON: CT angiography chest 09/06/2023, CT abdomen and pelvis 01/26/2023, CT abdomen and pelvis 09/21/2020. CLINICAL  HISTORY: Sepsis. FINDINGS: CHEST: MEDIASTINUM AND LYMPH NODES: Heart and pericardium are unremarkable. The central airways are clear. Calcified prevascular lymph node. Calcified left hilar lymph node. The main pulmonary artery is normal in caliber. No central or proximal segmental pulmonary embolus. Limited evaluation more distally due to timing of contrast. The thoracic aorta is normal in caliber. LUNGS AND PLEURA: Lobe mild architectural distortion and scarring. Bibasilar dependent atelectasis. There is a chronic stable centrally calcified anterior left upper lobe 1.1 x 1.3 cm pulmonary nodule consistent with granulomata. Interval development of a right middle lobe subpleural 4 mm solid micronodule along the minor fissure. No pleural effusion or pneumothorax. ABDOMEN AND PELVIS: LIVER: Diffusely hypodense hepatic parenchyma compared to the spleen. There is a right hepatic dome enhancing lesion mass measuring 1.4 x 1.5 cm that is consistent with a stable known hepatic hemangioma. GALLBLADDER AND BILE DUCTS: Calcified gallstones within the gallbladder lumen. 4 mm  calcified gallstone within the cystic duct. No cholelithiasis of the common bile duct. No associated gallbladder wall thickening or pericholecystic fluid. No biliary ductal dilatation. SPLEEN: No acute abnormality. PANCREAS: No acute abnormality. ADRENAL GLANDS: No acute abnormality. KIDNEYS, URETERS AND BLADDER: Fluid attenuation of the right kidney likely represents a simple renal cyst. Simple renal cysts do not require additional follow-up unless clinically indicated due to signs/symptoms. No stones in the kidneys or ureters. No hydronephrosis. No perinephric or periureteral stranding. Urinary bladder is unremarkable. GI AND BOWEL: Stomach demonstrates no acute abnormality. No small or large bowel thickening or dilatation. There is no bowel obstruction. Status post appendectomy. Colonic diverticulosis. REPRODUCTIVE ORGANS: The prostate is prominent in  size, measuring up to 4.9 cm. PERITONEUM AND RETROPERITONEUM: No ascites. No free air. VASCULATURE: Aorta is normal in caliber. ABDOMINAL AND PELVIS LYMPH NODES: No lymphadenopathy. BONES AND SOFT TISSUES: Diastasis rectus. No definite abdominal wall hernia. No inguinal hernia. No acute osseous abnormality. No focal soft tissue abnormality. IMPRESSION: 1. No acute findings in the chest, abdomen, and pelvis related to sepsis. 2. Calcified gallstones within the gallbladder lumen and cystic duct, without CT findings of acute cholecystitis or choledocholithiasis of the common bile duct. 3. New 4 mm right middle lobe solid pulmonary micronodule, with no routine follow-up imaging recommended as per Fleischner Society Guidelines. 4. Sequelae of prior granulomatous disease of the right lung. 5. Other, non-acute and/or normal findings as above. Electronically signed by: Morgane Naveau MD 12/16/2024 01:18 PM EST RP Workstation: [id number]   CT Head Wo Contrast Result Date: 12/16/2024 EXAM: CT HEAD WITHOUT CONTRAST 12/16/2024 12:45:25 PM TECHNIQUE: CT of the head was performed without the administration of intravenous contrast. Automated exposure control, iterative reconstruction, and/or weight based adjustment of the mA/kV was utilized to reduce the radiation dose to as low as reasonably achievable. COMPARISON: CT head 07/20/2023. CLINICAL HISTORY: 53 year old male with sepsis, altered mental status. FINDINGS: BRAIN AND VENTRICLES: No acute hemorrhage. No evidence of acute infarct. No hydrocephalus. No extra-axial collection. No mass effect or midline shift. Brain volume remains normal for age. Stable and normal gray white differentiation for age. No suspicious intracranial vascular hyperdensity. ORBITS: No acute abnormality. SINUSES: Scattered paranasal sinus mucosal thickening and small retention cysts. Aeration appears stable to improved from last year. SOFT TISSUES AND SKULL: Chronic left forehead and vertex scalp  soft tissue scarring. Tympanic cavities and mastoids appear well aerated. No skull fracture. IMPRESSION: 1. Normal for age non-contrast CT appearance of the brain. Electronically signed by: Helayne Hurst MD 12/16/2024 12:57 PM EST RP Workstation: [id number]   DG Chest Portable 1 View Result Date: 12/16/2024 CLINICAL DATA:  Shortness of breath. EXAM: PORTABLE CHEST 1 VIEW COMPARISON:  09/06/2023 FINDINGS: Low volume film. The cardio pericardial silhouette is enlarged. There is pulmonary vascular congestion without overt pulmonary edema. No acute bony abnormality. Telemetry leads overlie the chest. IMPRESSION: Low volume film with vascular congestion. Electronically Signed   By: Camellia Candle M.D.   On: 12/16/2024 11:35    ASSESSMENT/PLAN:  Assessment: Active Problems:   Alcohol withdrawal (HCC)   Plan: 53 year old male with HTN, HLD, AUD in remission and prior right colectomy s/p ileostomy reversal, presenting with generalized weakness and imbalance after acute alcohol relapse and poor oral intake, admitted for mild alcohol withdrawal and evaluation of cholelithiasis.  # Alcohol withdrawal (mild) Patient with acute alcohol relapse after 3 years of sobriety. Presented with tremulousness and imbalance, improved with lorazepam . CIWA scores remain low without progression. No history of  withdrawal seizures or DTs. Plan: - Continue CIWA monitoring q4h - Lorazepam  PRN per CIWA - Thiamine , folic acid , multivitamin - Counsel on alcohol cessation   #Generalized weakness and imbalance Likely multifactorial due to dehydration, poor PO intake, alcohol effects, and benzodiazepine exposure. Neuro exam nonfocal. CT head and MRI brain negative. Plan: - Encourage oral intake - Fall precautions - Monitor electrolytes  #Cholelithiasis with equivocal concern for cholecystitis RUQ ultrasound with gallstones and mild wall thickening, but patient remains asymptomatic with normal WBC and improving LFTs. General  Surgery consulted and does not feel presentation is consistent with acute cholecystitis at this time. Plan: - General Surgery following - Continue Unasyn - HIDA scan ordered per Surgery - NPO  - Monitor abdominal exam  #Chest pain, resolved Transient chest pain during EMS transport, now resolved. EKG sinus tachycardia without ischemic changes. Troponins reassuring. Plan: - Telemetry monitoring - No further workup unless symptoms recur  #Dehydration / poor oral intake Likely contributor to weakness and tachycardia. Labs improving. Plan: Encourage PO intake Monitor I/Os Daily BMP  # Electrolyte abnormalities Initial hypokalemia corrected. Magnesium  borderline low. Plan: Monitor K/Mg/Phos Replete PRN  #Hypertension Patient with known history of hypertension, currently on home lisinopril . Blood pressures during hospitalization have been mildly elevated but stable, without evidence of hypertensive urgency or end-organ damage. Elevations are likely multifactorial in the setting of alcohol withdrawal, anxiety, and acute illness. Plan: - Continue lisinopril  20 mg PO daily    Best Practice: Diet: NPO IVF: None VTE: Place and maintain sequential compression device Start: 12/16/24 1758 Code: Full  Disposition planning: Prior living: Home Anticipated discharge: Home Dispo: Inpatient, pending HIDA results and continued clinical stability  Signature:  Daisie Haft [facility]  7:19 AM, 12/17/2024  On Call pager [phone]  "

## 2024-12-17 NOTE — Plan of Care (Signed)

## 2024-12-17 NOTE — Progress Notes (Signed)
" °   12/17/24 1728  Assess: MEWS Score  Temp 98.2 F (36.8 C)  BP (!) 142/92  MAP (mmHg) 105  Pulse Rate (!) 125  ECG Heart Rate (!) 130  SpO2 93 %  O2 Device Room Air  Assess: MEWS Score  MEWS Temp 0  MEWS Systolic 0  MEWS Pulse 3  MEWS RR 0  MEWS LOC 0  MEWS Score 3  MEWS Score Color Yellow  Assess: if the MEWS score is Yellow or Red  Were vital signs accurate and taken at a resting state? Yes  Does the patient meet 2 or more of the SIRS criteria? No  MEWS guidelines implemented  No, previously yellow, continue vital signs every 4 hours  Assess: SIRS CRITERIA  SIRS Temperature  0  SIRS Respirations  0  SIRS Pulse 1  SIRS WBC 0  SIRS Score Sum  1   MD notified, Meds ordered.  EKG done  ST noted.  "

## 2024-12-17 NOTE — Discharge Instructions (Addendum)
 "    CCS CIRUGA DE [facility]  CIRUGA LAPAROSCPICA: INSTRUCCIONES POST OPERACIONALES Revise siempre la hoja de instrucciones de alta que le entreg el centro donde se realiz la ciruga. SI TIENE FORMULARIOS DE LICENCIA POR DISCAPACIDAD O FAMILIAR, DEBE TRAERLOS A LA OFICINA PARA SU PROCESAMIENTO. NO SE LOS D A SU MDICO.  CONTROL DE DOLOR  1. Rgimen del dolor: tome tylenol  (acetaminofn) de venta libre 1000 mg cada seis horas, ibuprofeno recetado (600 mg) cada seis horas y robaxina (metocarbamol) 750 mg cada seis horas. Con los tres, deberas tomar the sherwin-williams. Ejemplo: tylenol  (paracetamol) a las 8 a.m., ibuprofeno a las 10 a.m., robaxina (metocarbamol) a las 12 p.m., tylenol  (acetaminofn) nuevamente a las 2 p.m., ibuprofeno nuevamente a las 4 p.m., robaxina (metocarbamol) a las 6 p.m. Tambin tiene receta mdica para oxicodona, que debe tomar si el tylenol  (acetaminofn), el ibuprofeno y la robaxina (metocarbamol) no son suficientes para human resources officer. Puede tomar oxicodona con tanta frecuencia como cada cuatro horas, segn sea necesario, pero si est tomando los otros medicamentos como se indic anteriormente, no debera necesitar oxicodona con tanta frecuencia. Tambin le han recetado Miralax , que es un ablandador de heces. Tmelo segn lo prescrito porque la oxicodona puede causar estreimiento y Miralax  minimizar o chief strategy officer. No conduzca mientras est tomando o bajo la influencia de oxicodona, ya que es un medicamento narctico. 2. Utilice compresas de hielo para ayudar a human resources officer. 3. Si necesita un resurtido de su analgsico, comunquese con su farmacia. Se comunicarn con nuestra oficina para solicitar autorizacin. Las recetas no se surtirn despus de las 5 p. m. ni los fines de [place].  MEDICAMENTOS DOMSTICOS 4. Tome los medicamentos que le recetan habitualmente, a menos que se le indique lo contrario.  DIETA 5. Conviene seguir  una dieta ligera los primeros das tras la llegada a casa. Asegrese de incluir muchos lquidos diariamente. 6. No consuma alcohol mientras est tomando oxicodona o ibuprofeno.  CONSTIPACIN 7. Es comn experimentar algo de estreimiento despus de ignacia ciruga y si est tomando analgsicos. El estreimiento administrator, arts abdominal, por lo que es mejor prevenirlo aumentando la ingesta de lquidos y tomando un ablandador de health visitor. Ya le han recetado un laxante suave, Miralax , que debe tomar una vez al da. Puede aumentar la dosis de Miralax  a dos o incluso tres veces al da hasta que tenga una evacuacin intestinal. Si 24 horas despus de tomar Miralax  tres veces al da sigue sin evacuar, puede probar con citrato de magnesio, disponible sin receta en su farmacia local.  CUIDADO DE HERIDAS/INCISIONES 8. La mayora de los pacientes experimentarn algo de hinchazn y hematomas en el rea de las incisiones. Las bolsas de hielo ayudarn. La hinchazn y los hematomas pueden tardar aetna. 9. Mennie de mayo a malinda de 27 Deciembre 2025. 10. No retire ni frote el pegamento para la piel. Puede permitir que agua tibia y jabn corra sobre la incisin, luego enjuagar y haematologist. 11. No se sumerja en agua (baeras, jacuzzis, piscinas, lagos, ocanos) durante c.h. robinson worldwide.  ACTIVIDADES 12. Puede reanudar las actividades diarias habituales (ligeras) a glass blower/designer del da siguiente, como el cuidado personal diario, advertising account planner, subir escaleras, aumentando gradualmente las actividades segn las Cecil. Puede tener relaciones sexuales cuando le resulte cmodo. 13. No levantar ms de 5 libras durante seis semanas. 14. Puede conducir cuando ya no est tomando analgsicos narcticos, puede usar cmodamente el cinturn de seguridad y geophysical data processor su automvil  y contractor los frenos con seguridad.  HACER UN SEGUIMIENTO 15. Debe consultar a su mdico en el consultorio para una cita de seguimiento  aproximadamente 2 a 3 semanas despus de la ciruga. Se le debera haber dado su cita postoperatoria/de seguimiento cuando se program su ciruga. Si no recibi una cita postoperatoria/de seguimiento, asegrese de llamar para programar esta cita dentro de uno o [employer]erson inc de llegar a casa para asegurar una cita conveniente.  CUNDO LLAMAR A SU MDICO: 1. Fiebre superior a 101,5 2. Incapacidad para orinar 3. Sangrado continuo de la incisin. 4. Aumento del dolor, enrojecimiento o drenaje de la incisin. 5. Dolor abdominal creciente  El personal de la clnica est disponible para responder sus preguntas durante el horario comercial habitual. No dude en llamar y pedir hablar con una de las enfermeras si tiene inquietudes clnicas. Si tiene kelly services, vaya a la sala de emergencias ms cercana o llame al 911. Rolanda rutha everitt [facility] siempre est de [facility].  [address], [place], [place]  [address] ? [address] [address], [place], [place]   [address] [phone] ? [phone] ? FAX [phone] Web site: [email or web address]     ----------------------------------------------------------------------- Internal Medicine team: You were admitted to the hospital for weakness and imbalance after recent alcohol use. During your stay, you were found to have gallstones and underwent gallbladder surgery (laparoscopic cholecystectomy). You were also treated for dehydration, low electrolytes, and symptoms related to recent alcohol use.  Activity Resume activity as tolerated Avoid heavy lifting (greater than 10-15 lbs) for 2-4 weeks or as directed by Surgery Walking is encouraged Avoid strenuous activity until cleared by your surgeon  Diet Resume a regular diet Start with lighter meals and advance as tolerated Drink plenty of fluids to stay well hydrated  Medications Take all medications as prescribed Continue your home blood  pressure medication (lisinopril ) Use pain medications only as directed Continue vitamins if prescribed (thiamine , folic acid , multivitamin)  Wound / Incision Care Keep surgical incisions clean and dry You may shower, but avoid soaking in baths or swimming until cleared by Surgery Watch for redness, swelling, drainage, or worsening pain at incision sites  Alcohol Use Avoid alcohol completely Alcohol use increases the risk of complications, poor healing, and future hospitalizations Follow up with your primary care provider for ongoing support and resources for maintaining sobriety  When to Seek Medical Attention Call your doctor or return to the Emergency Department if you develop: Fever greater than 100.50F (38C) Worsening abdominal pain Persistent nausea or vomiting Yellowing of the eyes or skin Chest pain or shortness of breath Signs of infection at surgical sites Tremors, confusion, hallucinations, or worsening anxiety  Follow-Up Appointments General Surgery: Follow up as scheduled for post-operative care Primary Care Provider: Follow up within 1-2 weeks after discharge -------------------------------------------------------------------------------------------------- Motivo de Hospitalizacin  Usted fue hospitalizado por debilidad y problemas de equilibrio despus de consumo reciente de alcohol y poca ingesta de alimentos. Durante su hospitalizacin, se encontraron clculos en la vescula biliar y se le realiz una ciruga laparoscpica para remover la vescula. Actualmente se encuentra estable y listo para regresar a casa.  Consumo de Alcohol  Sus sntomas probablemente estuvieron relacionados con el consumo reciente de alcohol, deshidratacin y mala alimentacin. Aunque se consider la posibilidad de abstinencia alcohlica, su evolucin no mostr evidencia clara de abstinencia significativa. Es muy importante que evite completamente el alcohol despus del alta, ya que puede  empeorar su salud y causar  nuevas complicaciones.  Si desea ayuda para mantenerse sobrio, hable con su mdico de atencin primaria.  Medicamentos  Tome todos los medicamentos exactamente como se le indicaron  Contine las vitaminas (como tiamina y cido flico) si fueron recetadas  No consuma alcohol mientras toma sus medicamentos  No inicie medicamentos nuevos sin consultar a su mdico  Dieta  Puede reanudar una dieta regular segn la tolere  Consuma comidas balanceadas y beba suficientes lquidos  Evite comidas muy grasosas si le causan dentist durante los primeros das  Actividad  Camine todos [facility] segn lo tolere  Evite levantar objetos pesados (ms de 5-7 kilos) por al menos 2 semanas  Regrese gradualmente a sus actividades normales  Cuidado de Heridas  Mantenga las incisiones limpias y Passenger Transport Manager, pero evite baos de tina o nadar hasta que ciruga lo autorice  Vigile enrojecimiento, hinchazn, secrecin o engineer, mining creciente en las heridas  Cundo Buscar Atencin Mdica  Llame a su mdico o acuda a urgencias si presenta:  Lajune o escalofros  Dolor abdominal que empeora  Color amarillo en piel u ojos  Nuseas o vmitos persistentes  Dolor en el pecho o dificultad para respirar  Sntomas de abstinencia alcohlica (temblores intensos, confusin, alucinaciones, convulsiones)  Seguimiento Seguimiento con su mdico de atencin primaria Seguimiento con ciruga segn indicaciones  Substance Abuse Resources     [facility] - Admissions are currently completed Monday through Friday at 8am; both appointments and walk-ins are accepted.  Any individual that is a [facility] resident may present for a substance abuse screening and assessment for admission.  A person may be referred by numerous sources or self-refer.   Potential clients will be screened for medical necessity and appropriateness for the program.  Clients  must meet criteria for high-intensity residential treatment services.  If clinically appropriate, a client will continue with the comprehensive clinical assessment and intake process, as well as enrollment in the [facility].   Address: [address] [place], [place] [address] Admin Hours: Mon-Fri 8AM to [facility] Hours: 24/7 Phone: [phone] Fax: [phone]   [facility] (Detox) Facility Based Crisis:  These are 3 locations for services: Please call before arrival:     [facility] Recovery Facility Based Crisis [facility])  Address: [address] [place], [place] [address] Phone: [phone]   [facility] Facility Based Crisis [facility]) Address: [address] [place], [place] [address] Phone#: [phone]   [facility] Facility Based Crisis [facility]) Address: [address] [place], [place] [address] Phone#: [phone]     [facility]): (offers outpatient therapy and intensive outpatient substance abuse therapy).  [address] [place], [place] [address] Phone: [phone]   [facility] Address: [address] [place], [place] [address] Phone: [phone]   -The [facility] provides food, shelter and other programs and services to the homeless men of [place] through our [employer] program.   By offering safe shelter, three meals a day, clean clothing, Biblical counseling, financial planning, vocational training, GED/education and employment assistance, we've helped mend the shattered lives of many homeless men since opening in [place].   We have approximately 267 beds available, with a max of 312 beds including mats for emergency situations and currently house an average of 270 men a night.   Prospective Client Check-In Information Photo ID Required (State/ Out of State/ [facility]) - if photo ID is not available, clients  are required to have a printout of a police/sheriff's criminal  history report. Help out with chores around the [facility]. No sex offender of any type (pending, charged, registered and/or any other sex related offenses) will be permitted to check in. Must be willing to abide by all rules, regulations, and policies established by the [employer]. The following will be provided - shelter, food, clothing, and biblical counseling. If you or someone you know is in need of assistance at our [facility] shelter in [place], [place], please call [phone]. [phone].   [facility] for [employer] hours are Monday-Friday only.    [facility]:    Phone: [phone]   The [facility] Intensive Outpatient Program [facility]) means structured individual and group addiction activities and services that are provided at an outpatient program designed to assist adult and adolescent consumers to begin recovery and learn skills for recovery maintenance. The [employer] SAIOP program is offered at least 3 hours a day, 3 days a week. SAIOP services shall include a structured program consisting of, but not limited to, the following services: Individual counseling and support; Group counseling and support; Family counseling, training or support; Biochemical assays to identify recent drug use (e.g., urine drug screens); Strategies for relapse prevention to include community and social support systems in treatment; Life skills; Crisis contingency planning; Disease Management; and Treatment support activities that have been adapted or specifically designed for persons with physical disabilities, or persons with co-occurring disorders of mental illness and substance abuse/dependence or mental retardation/developmental disability and substance abuse/dependence.   Phone: [phone]    [facility])   Address: [address] [place], [place], [place] [address] Phone: [phone]     [facility] Address: [address] [place], [place] [address] Phone: [phone]   - a combination of group and individual sessions to meet the participants needs. This allows participants to engage in treatment and remain involved in their home and work life.  - Transitional housing places program participants in a supportive living environment while they complete a treatment program and work to secure independent housing.  - The Substance Abuse Intensive Outpatient Treatment Program at [employer] consists of structured group sessions and individual sessions that are designed to teach participants early recovery and relapse prevention skills.  -[facility] works with the [employer] to provide a housing and treatment program for homeless veterans.    Residential Company [facility], [employer].    Address: [address] [place], [place] [address] Phone#: [phone]    : Referrals to RTSA facilities can be made by [employer] and [facility].  Referrals are also accepted from physicians, private providers, hospital emergency rooms, family members, or any person who has knowledge of someone in the need of our services.   The [facility] will also offer the following outpatient services: (Monday through Friday 8am-5pm)   Partial Hospitalization Program (PHP) Substance Abuse Intensive Outpatient Program (SA-IOP) Group Therapy Medication Management Peer Living Room We also provide (24/7):  Assessments: Our mental health clinician and providers will conduct a focused mental health evaluation, assessing for immediate safety concerns and further mental health needs. Referral: Our team will provide resources and help connect to community based mental health treatment, when indicated, including psychotherapy, psychiatry, and other specialized behavioral health or substance use disorder services (for those not already in treatment). Transitional Care:  Our team providers in person bridging and/or  telephonic follow-up during the patient's transition to outpatient services.    The [facility] 24-Hour Call Center: [phone] Behavioral Health Crisis Line: [phone]  "

## 2024-12-17 NOTE — Progress Notes (Signed)
 "  Trauma/Critical Care Follow Up Note  Subjective:    Overnight Issues:   Objective:  Vital signs for last 24 hours: Temp:  [97.6 F (36.4 C)-98.9 F (37.2 C)] 98.2 F (36.8 C) (12/26 0903) Pulse Rate:  [77-108] 100 (12/26 0903) Resp:  [13-20] 20 (12/26 0903) BP: (121-160)/(85-99) 145/86 (12/26 0903) SpO2:  [94 %-100 %] 94 % (12/26 0903) Weight:  [77 kg] 77 kg (12/26 0903)  Intake/Output from previous day: 12/25 0701 - 12/26 0700 In: 50 [IV Piggyback:50] Out: 400 [Urine:400]  Intake/Output this shift: No intake/output data recorded.  Vent settings for last 24 hours:    Physical Exam:  Gen: comfortable, no distress Neuro: follows commands, alert, communicative HEENT: PERRL Neck: supple CV: RRR Pulm: unlabored breathing on RA Abd: soft, NT  , +BM, significant abdominal wall scarring GU: urine clear and yellow, +spontaneous voids Extr: wwp, no edema  Results for orders placed or performed during the hospital encounter of 12/16/24 (from the past 24 hours)  I-stat chem 8, ED (not at [facility], [facility] or [facility])     Status: Abnormal   Collection Time: 12/16/24 11:41 AM  Result Value Ref Range   Sodium 140 135 - 145 mmol/L   Potassium 3.3 (L) 3.5 - 5.1 mmol/L   Chloride 100 98 - 111 mmol/L   BUN 7 6 - 20 mg/dL   Creatinine, Ser 9.19 0.61 - 1.24 mg/dL   Glucose, Bld 888 (H) 70 - 99 mg/dL   Calcium , Ion 1.03 (L) 1.15 - 1.40 mmol/L   TCO2 24 22 - 32 mmol/L   Hemoglobin 17.3 (H) 13.0 - 17.0 g/dL   HCT 48.9 60.9 - 47.9 %  Ethanol     Status: Abnormal   Collection Time: 12/16/24 11:45 AM  Result Value Ref Range   Alcohol, Ethyl (B) 125 (H) <15 mg/dL  Folate, serum, performed at [facility] lab     Status: None   Collection Time: 12/16/24  4:50 PM  Result Value Ref Range   Folate 18.3 >5.9 ng/mL  Magnesium      Status: None   Collection Time: 12/16/24  9:25 PM  Result Value Ref Range   Magnesium  1.7 1.7 - 2.4 mg/dL  Protime-INR     Status: None   Collection Time: 12/16/24   9:25 PM  Result Value Ref Range   Prothrombin Time 14.7 11.4 - 15.2 seconds   INR 1.1 0.8 - 1.2  HIV Antibody (routine testing w rflx)     Status: None   Collection Time: 12/17/24  5:22 AM  Result Value Ref Range   HIV Screen 4th Generation wRfx Non Reactive Non Reactive  Comprehensive metabolic panel     Status: Abnormal   Collection Time: 12/17/24  5:22 AM  Result Value Ref Range   Sodium 136 135 - 145 mmol/L   Potassium 4.1 3.5 - 5.1 mmol/L   Chloride 102 98 - 111 mmol/L   CO2 24 22 - 32 mmol/L   Glucose, Bld 86 70 - 99 mg/dL   BUN 10 6 - 20 mg/dL   Creatinine, Ser 9.23 0.61 - 1.24 mg/dL   Calcium  8.7 (L) 8.9 - 10.3 mg/dL   Total Protein 6.4 (L) 6.5 - 8.1 g/dL   Albumin  3.7 3.5 - 5.0 g/dL   AST 31 15 - 41 U/L   ALT 25 0 - 44 U/L   Alkaline Phosphatase 133 (H) 38 - 126 U/L   Total Bilirubin 1.6 (H) 0.0 - 1.2 mg/dL   GFR,  Estimated >60 >60 mL/min   Anion gap 11 5 - 15  CBC     Status: None   Collection Time: 12/17/24  5:22 AM  Result Value Ref Range   WBC 5.4 4.0 - 10.5 K/uL   RBC 5.01 4.22 - 5.81 MIL/uL   Hemoglobin 15.0 13.0 - 17.0 g/dL   HCT 54.4 60.9 - 47.9 %   MCV 90.8 80.0 - 100.0 fL   MCH 29.9 26.0 - 34.0 pg   MCHC 33.0 30.0 - 36.0 g/dL   RDW 85.4 88.4 - 84.4 %   Platelets 160 150 - 400 K/uL   nRBC 0.0 0.0 - 0.2 %  TSH     Status: None   Collection Time: 12/17/24  5:22 AM  Result Value Ref Range   TSH 1.950 0.350 - 4.500 uIU/mL    Assessment & Plan:  LOS: 0 days   Additional comments:I reviewed the patient's new clinical lab test results.   and I reviewed the patients new imaging test results.    Gallstones and hyperbilirubinemia - new hyperbili today, mild elevation, will plan to proceed with lap chole and IOC. Informed consent was obtained after detailed explanation of risks, including bleeding, infection, biloma, hematoma, injury to common bile duct, need for IOC to delineate anatomy, and need for conversion to open procedure. Extensive discussion  regarding the possibility of conversion to open given prior surgical history. Also discussed the possibility of need for post-op ERCP.  All questions answered to the patient's satisfaction. Wife participated via phone for consent and all her questions were answered.  FEN - NPO except sips/chips DVT - SCDs, LMWH Dispo - med-surg    Carl GEANNIE Hanger, MD Trauma & General Surgery Please use [email or web address] to contact on call provider  12/17/2024  *Care during the described time interval was provided by me. I have reviewed this patient's available data, including medical history, events of note, physical examination and test results as part of my evaluation.    "

## 2024-12-17 NOTE — Transfer of Care (Signed)
 Immediate Anesthesia Transfer of Care Note  Patient: Carl Briggs  Procedure(s) Performed: LAPAROSCOPIC CHOLECYSTECTOMY  Patient Location: PACU  Anesthesia Type:General  Level of Consciousness: drowsy and patient cooperative  Airway & Oxygen Therapy: Patient Spontanous Breathing and Patient connected to nasal cannula oxygen  Post-op Assessment: Report given to RN, Post -op Vital signs reviewed and stable, and Patient moving all extremities X 4  Post vital signs: Reviewed and stable  Last Vitals:  Vitals Value Taken Time  BP 144/91 12/17/24 13:04  Temp 36.4 C 12/17/24 13:04  Pulse 89 12/17/24 13:06  Resp 29 12/17/24 13:06  SpO2 94 % 12/17/24 13:06  Vitals shown include unfiled device data.  Last Pain:  Vitals:   12/17/24 0903  TempSrc: Oral  PainSc:          Complications: No notable events documented.

## 2024-12-17 NOTE — Anesthesia Postprocedure Evaluation (Signed)
"   Anesthesia Post Note  Patient: Carl Briggs  Procedure(s) Performed: LAPAROSCOPIC CHOLECYSTECTOMY     Patient location during evaluation: PACU Anesthesia Type: General Level of consciousness: awake and alert Pain management: pain level controlled Vital Signs Assessment: post-procedure vital signs reviewed and stable Respiratory status: spontaneous breathing, nonlabored ventilation, respiratory function stable and patient connected to nasal cannula oxygen Cardiovascular status: blood pressure returned to baseline and stable Postop Assessment: no apparent nausea or vomiting Anesthetic complications: no   No notable events documented.  Last Vitals:  Vitals:   12/17/24 1334 12/17/24 1429  BP: (!) 145/95 (!) 146/97  Pulse: 92 97  Resp: 18 16  Temp: 36.9 C 36.9 C  SpO2: 93% 94%    Last Pain:  Vitals:   12/17/24 1429  TempSrc: Oral  PainSc:    Pain Goal:                   Rami Budhu L [employer]      "

## 2024-12-17 NOTE — Anesthesia Preprocedure Evaluation (Signed)
 "                                  Anesthesia Evaluation  Patient identified by MRN, date of birth, ID band Patient awake    Reviewed: Allergy & Precautions, NPO status , Patient's Chart, lab work & pertinent test results  Airway Mallampati: III  TM Distance: >3 FB     Dental  (+) Chipped, Dental Advisory Given   Pulmonary neg pulmonary ROS   Pulmonary exam normal breath sounds clear to auscultation       Cardiovascular hypertension, Normal cardiovascular exam Rhythm:Regular Rate:Normal     Neuro/Psych   Anxiety     negative neurological ROS     GI/Hepatic Neg liver ROS,GERD  Medicated,,  Chemistry         Component                Value               Date/Time                 NA                       136                 12/17/2024 0522           NA                       139                 09/26/2023 1031           K                        4.1                 12/17/2024 0522           CL                       102                 12/17/2024 0522           CO2                      24                  12/17/2024 0522           BUN                      10                  12/17/2024 0522           BUN                      10                  09/26/2023 1031           CREATININE               0.76                12/17/2024 0522  Component                Value               Date/Time                 CALCIUM                   8.7 (L)             12/17/2024 0522           ALKPHOS                  133 (H)             12/17/2024 0522           AST                      31                  12/17/2024 0522           ALT                      25                  12/17/2024 0522           BILITOT                  1.6 (H)             12/17/2024 0522           BILITOT                  0.3                 09/26/2023 1031        Cholelithiasis with acute cholecystitis S/P Colectomy, ileostomy, and closure repair of parastomal hernia   Endo/Other   Obesity Hyperlipidemia  Renal/GU negative Renal ROS  negative genitourinary   Musculoskeletal negative musculoskeletal ROS (+)    Abdominal  (+) + obese  Peds  Hematology negative hematology ROS (+) Lab Results      Component                Value               Date                      WBC                      5.4                 12/17/2024                HGB                      15.0                12/17/2024                HCT                      45.5                12/17/2024  MCV                      90.8                12/17/2024                PLT                      160                 12/17/2024              Anesthesia Other Findings   Reproductive/Obstetrics                              Anesthesia Physical Anesthesia Plan  ASA: 2  Anesthesia Plan: General   Post-op Pain Management: Dilaudid  IV, Precedex  and Ofirmev  IV (intra-op)*   Induction: Intravenous and Cricoid pressure planned  PONV Risk Score and Plan: 4 or greater and Treatment may vary due to age or medical condition, Midazolam , Ondansetron  and Dexamethasone   Airway Management Planned: Oral ETT  Additional Equipment: None  Intra-op Plan:   Post-operative Plan: Extubation in OR  Informed Consent: I have reviewed the patients History and Physical, chart, labs and discussed the procedure including the risks, benefits and alternatives for the proposed anesthesia with the patient or authorized representative who has indicated his/her understanding and acceptance.     Dental advisory given and Interpreter used for interview  Plan Discussed with: CRNA and Anesthesiologist  Anesthesia Plan Comments:          Anesthesia Quick Evaluation  "

## 2024-12-17 NOTE — Op Note (Signed)
 "  Operative Note  Date: 12/17/2024  Procedure: laparoscopic cholecystectomy, attempted intra-operative cholangiogram  Pre-op diagnosis: gallstones, hyperbilirubinemia Post-op diagnosis: same  Indication and clinical history: The patient is a 53 y.o. year old male with hyperbilirubinemia and gallstones.   Surgeon: Dreama GEANNIE Hanger, MD  Anesthesiologist: Jerrye, MD Anesthesia: General  Findings:  Specimen: gallbladder EBL: 5cc Drains/Implants: surgicel snow  Disposition: PACU - hemodynamically stable.  Description of procedure: The patient was positioned supine on the operating room table. Time-out was performed verifying correct patient, procedure, signature of informed consent, and administration of pre-operative antibiotics. General anesthetic induction and intubation were uneventful. The abdomen was prepped and draped in the usual sterile fashion. Due to prior scarring, a Veress needle was inserted at Palmers point and the abdomen entered using an optiview technique. The abdominal cavity was inspected and no injury of any intra-abdominal structures was identified. Numerous loops of small bowel were densely adherent to the anterior abdominal wall centrally near the umbilicus, so this area was completely avoided. Three additional ports were placed under direct visualization and using local anesthetic: two 5mm ports in the right subcostal region and a 5mm port in the epigastric region. The patient was re-positioned to reverse Trendelenburg and right side up. Adhesiolysis was performed to expose the gallbladder, which was then retracted cephalad. The infundibulum was identified and retracted toward the right lower quadrant. The peritoneum was incised over the infundibulum and the triangle of Calot dissected to expose the critical view of safety. With clear identification and isolation of the cystic duct and cystic artery, the cystic artery was doubly clipped and divided. After this, the cystic  duct was identified as a single structure entering the gallbladder. A clip was placed on the cystic duct close to the neck of the gallbladder. A nick was made in the cystic duct but the cholangiogram catheter was unable to be threaded. After multiple attempts, the cholangiogram was aborted and the cystic duct was doubly clipped and divided. The gallbladder was dissected off the liver bed using electrocautery and hemostasis of the liver bed was confirmed prior to separation of the final peritoneal attachments of the gallbladder to the liver bed. There was spillage of bile during dissection. The gallbladder fossa was irrigated and fluid returned clear. After transection of the final peritoneal attachments, the gallbladder was placed in an endoscopic specimen retrieval bag, removed via the umbilical port site, and sent to pathology as a permanent specimen. The gallbladder fossa was inspected confirming the absence of bile leakage from the cystic duct stump and correct placement of clips on the cystic artery and cystic duct stumps. There was a small amount of oozing and a piece of surgicel snow was placed in the gallbladder fossa. The abdomen was desufflated and the fascia of the umbilical port site was closed using the previously placed stay sutures. Additional local anesthetic was administered at the umbilical port site.  The skin of all incisions was closed with 4-0 monocryl. Sterile dressings were applied. All sponge and instrument counts were correct at the conclusion of the procedure. The patient was awakened from anesthesia, extubated uneventfully, and transported to the PACU - hemodynamically stable.. There were no complications.    Upon entering the abdomen (organ space), I encountered infection of the galllbladder.  CASE DATA:  Type of patient?: DOW CASE (Surgical Hospitalist [facility])  Status of Case? URGENT Add On  Infection Present At Time Of Surgery (PATOS)?  INFECTION of the  gallbladder    Dreama GEANNIE Hanger,  MD General and Trauma Surgery [facility]  "

## 2024-12-17 NOTE — Anesthesia Procedure Notes (Signed)
 Procedure Name: Intubation Date/Time: 12/17/2024 11:54 AM  Performed by: Lamar Lucie DASEN, CRNAPre-anesthesia Checklist: Patient identified, Emergency Drugs available, Suction available and Patient being monitored Patient Re-evaluated:Patient Re-evaluated prior to induction Oxygen Delivery Method: Circle system utilized Preoxygenation: Pre-oxygenation with 100% oxygen Induction Type: IV induction, Rapid sequence and Cricoid Pressure applied Laryngoscope Size: Mac and 4 Grade View: Grade I Tube type: Oral Tube size: 7.5 mm Number of attempts: 1 Airway Equipment and Method: Stylet and Oral airway Placement Confirmation: ETT inserted through vocal cords under direct vision, positive ETCO2 and breath sounds checked- equal and bilateral Secured at: 22 cm Tube secured with: Tape Dental Injury: Teeth and Oropharynx as per pre-operative assessment

## 2024-12-18 ENCOUNTER — Encounter (HOSPITAL_COMMUNITY): Payer: Self-pay | Admitting: Surgery

## 2024-12-18 ENCOUNTER — Other Ambulatory Visit (HOSPITAL_COMMUNITY): Payer: Self-pay

## 2024-12-18 DIAGNOSIS — K8000 Calculus of gallbladder with acute cholecystitis without obstruction: Secondary | ICD-10-CM | POA: Diagnosis present

## 2024-12-18 DIAGNOSIS — F10929 Alcohol use, unspecified with intoxication, unspecified: Secondary | ICD-10-CM | POA: Diagnosis present

## 2024-12-18 LAB — HEPATIC FUNCTION PANEL
ALT: 50 U/L — ABNORMAL HIGH (ref 0–44)
AST: 71 U/L — ABNORMAL HIGH (ref 15–41)
Albumin: 4 g/dL (ref 3.5–5.0)
Alkaline Phosphatase: 148 U/L — ABNORMAL HIGH (ref 38–126)
Bilirubin, Direct: 0.4 mg/dL — ABNORMAL HIGH (ref 0.0–0.2)
Indirect Bilirubin: 0.7 mg/dL (ref 0.3–0.9)
Total Bilirubin: 1.1 mg/dL (ref 0.0–1.2)
Total Protein: 7.2 g/dL (ref 6.5–8.1)

## 2024-12-18 LAB — CBC
HCT: 41.6 % (ref 39.0–52.0)
Hemoglobin: 14.5 g/dL (ref 13.0–17.0)
MCH: 30 pg (ref 26.0–34.0)
MCHC: 34.9 g/dL (ref 30.0–36.0)
MCV: 86.1 fL (ref 80.0–100.0)
Platelets: 191 K/uL (ref 150–400)
RBC: 4.83 MIL/uL (ref 4.22–5.81)
RDW: 13.7 % (ref 11.5–15.5)
WBC: 10.5 K/uL (ref 4.0–10.5)
nRBC: 0 % (ref 0.0–0.2)

## 2024-12-18 LAB — BASIC METABOLIC PANEL WITH GFR
Anion gap: 11 (ref 5–15)
BUN: 12 mg/dL (ref 6–20)
CO2: 24 mmol/L (ref 22–32)
Calcium: 8.4 mg/dL — ABNORMAL LOW (ref 8.9–10.3)
Chloride: 99 mmol/L (ref 98–111)
Creatinine, Ser: 0.8 mg/dL (ref 0.61–1.24)
GFR, Estimated: >60 mL/min
Glucose, Bld: 165 mg/dL — ABNORMAL HIGH (ref 70–99)
Potassium: 4 mmol/L (ref 3.5–5.1)
Sodium: 134 mmol/L — ABNORMAL LOW (ref 135–145)

## 2024-12-18 MED ORDER — POLYETHYLENE GLYCOL 3350 17 GM/SCOOP PO POWD
17.0000 g | Freq: Every day | ORAL | 1 refills | Status: AC
  Filled 2024-12-18: qty 238, 14d supply, fill #0

## 2024-12-18 MED ORDER — FAMOTIDINE 20 MG PO TABS
20.0000 mg | ORAL_TABLET | Freq: Two times a day (BID) | ORAL | 0 refills | Status: AC
  Filled 2024-12-18: qty 30, 15d supply, fill #0

## 2024-12-18 MED ORDER — ESCITALOPRAM OXALATE 10 MG PO TABS
10.0000 mg | ORAL_TABLET | Freq: Every day | ORAL | 2 refills | Status: AC
  Filled 2024-12-18: qty 30, 30d supply, fill #0

## 2024-12-18 MED ORDER — ADULT MULTIVITAMIN W/MINERALS CH
1.0000 | ORAL_TABLET | Freq: Every day | ORAL | 0 refills | Status: DC
  Filled 2024-12-18: qty 30, 30d supply, fill #0

## 2024-12-18 MED ORDER — FOLIC ACID 1 MG PO TABS
1.0000 mg | ORAL_TABLET | Freq: Every day | ORAL | 0 refills | Status: DC
  Filled 2024-12-18: qty 30, 30d supply, fill #0

## 2024-12-18 MED ORDER — THIAMINE HCL 100 MG PO TABS
100.0000 mg | ORAL_TABLET | Freq: Every day | ORAL | 0 refills | Status: DC
  Filled 2024-12-18: qty 30, 30d supply, fill #0

## 2024-12-18 MED ORDER — ACETAMINOPHEN 500 MG PO TABS
1000.0000 mg | ORAL_TABLET | Freq: Three times a day (TID) | ORAL | 0 refills | Status: AC | PRN
  Filled 2024-12-18: qty 30, 5d supply, fill #0

## 2024-12-18 NOTE — Progress Notes (Signed)
" ° °  Trauma/Critical Care Follow Up Note  Subjective:    Overnight Issues:  Tolerating PO with minimal pain and no nausea CMP pending Objective:  Vital signs for last 24 hours: Temp:  [97.5 F (36.4 C)-98.4 F (36.9 C)] 97.8 F (36.6 C) (12/27 0805) Pulse Rate:  [92-128] 102 (12/27 0805) Resp:  [16-20] 18 (12/27 0805) BP: (130-150)/(86-97) 131/88 (12/27 0805) SpO2:  [92 %-100 %] 94 % (12/27 0805) Weight:  [77 kg] 77 kg (12/26 0903)  Intake/Output from previous day: 12/26 0701 - 12/27 0700 In: 850 [I.V.:800; IV Piggyback:50] Out: 770 [Urine:750; Blood:20]  Intake/Output this shift: No intake/output data recorded.  Vent settings for last 24 hours:    Physical Exam:  Gen: comfortable, no distress Neuro: follows commands, alert, communicative HEENT: PERRL Neck: supple CV: RRR Pulm: unlabored breathing on RA Abd: soft, NT  , port sites clean and dry GU: urine clear and yellow, +spontaneous voids Extr: wwp, no edema  Results for orders placed or performed during the hospital encounter of 12/16/24 (from the past 24 hours)  CBC     Status: None   Collection Time: 12/18/24  5:29 AM  Result Value Ref Range   WBC 10.5 4.0 - 10.5 K/uL   RBC 4.83 4.22 - 5.81 MIL/uL   Hemoglobin 14.5 13.0 - 17.0 g/dL   HCT 58.3 60.9 - 47.9 %   MCV 86.1 80.0 - 100.0 fL   MCH 30.0 26.0 - 34.0 pg   MCHC 34.9 30.0 - 36.0 g/dL   RDW 86.2 88.4 - 84.4 %   Platelets 191 150 - 400 K/uL   nRBC 0.0 0.0 - 0.2 %  Basic metabolic panel     Status: Abnormal   Collection Time: 12/18/24  5:29 AM  Result Value Ref Range   Sodium 134 (L) 135 - 145 mmol/L   Potassium 4.0 3.5 - 5.1 mmol/L   Chloride 99 98 - 111 mmol/L   CO2 24 22 - 32 mmol/L   Glucose, Bld 165 (H) 70 - 99 mg/dL   BUN 12 6 - 20 mg/dL   Creatinine, Ser 9.19 0.61 - 1.24 mg/dL   Calcium  8.4 (L) 8.9 - 10.3 mg/dL   GFR, Estimated >39 >39 mL/min   Anion gap 11 5 - 15    Assessment & Plan:  LOS: 0 days   Additional comments:I reviewed the  patient's new clinical lab test results.   and I reviewed the patients new imaging test results.    Gallstones and hyperbilirubinemia s/p lap chole w/ attempted IOC - CMP pending. If Tbili is normal then patient would be suitable for discharge. If Tbili is elevated then patient will need MRCP and consult by GI FEN - Regular DVT - SCDs, LMWH Dispo - med-surg, possible discharge today   Cordella Idler, MD Trauma & General Surgery Please use [email or web address] to contact on call provider  12/18/2024  *Care during the described time interval was provided by me. I have reviewed this patient's available data, including medical history, events of note, physical examination and test results as part of my evaluation.    "

## 2024-12-18 NOTE — Plan of Care (Signed)

## 2024-12-18 NOTE — Discharge Summary (Addendum)
 "  Name: Mithran Strike MRN: [id number] DOB: [birth year] 53 y.o. PCP: Heddy Barren, DO  Date of Admission: 12/16/2024 10:53 AM Date of Discharge: 12/18/2024 Attending Physician: Dr. Mliss Pouch  Discharge Diagnosis: 1. Principal Problem:   Calculus of gallbladder with acute cholecystitis Active Problems:   Alcohol intoxication in episodic drinker  Discharge Medications: Allergies as of 12/18/2024   No Known Allergies      Medication List     STOP taking these medications    Methocarbamol  1000 MG Tabs   oxyCODONE  5 MG immediate release tablet Commonly known as: Roxicodone        TAKE these medications    Acetaminophen  Extra Strength 500 MG Tabs Commonly known as: TYLENOL  Take 2 tablets (1,000 mg total) by mouth every 8 (eight) hours as needed for up to 10 days.   CertaVite/Antioxidants Tabs Tome 1 tableta por va oral diariamente. (Take 1 tablet by mouth daily.) Start taking on: December 19, 2024   escitalopram  10 MG tablet Commonly known as: Lexapro  Tome 1 tableta (10 mg en total) por va oral diariamente. (Take 1 tablet (10 mg total) by mouth daily.)   famotidine  20 MG tablet Commonly known as: PEPCID  Take 1 tablet (20 mg total) by mouth 2 (two) times daily.   folic acid  1 MG tablet Commonly known as: FOLVITE  Tome 1 tableta (1 mg en total) por va oral diariamente. (Take 1 tablet (1 mg total) by mouth daily.) Start taking on: December 19, 2024   lisinopril  20 MG tablet Commonly known as: ZESTRIL  Take 20 mg by mouth daily.   polyethylene glycol powder 17 GM/SCOOP powder Commonly known as: MiraLax  Take 17 g by mouth daily. Dissolve 1 capful (17g) in 4-8 ounces of liquid and take by mouth daily.   thiamine  100 MG tablet Commonly known as: VITAMIN B1 Tome 1 tableta (100 mg en total) por va oral diariamente. (Take 1 tablet (100 mg total) by mouth daily.) Start taking on: December 19, 2024        Disposition and follow-up:    Mr.Izmael Jabar Krysiak was discharged from [facility] in Stable condition.  At the hospital follow up visit please address:  1.  At the hospital follow-up visit, please address: - Post-operative recovery after laparoscopic cholecystectomy, including wound healing, pain control, and diet tolerance and bilirubin.  - Alcohol cessation counseling and monitoring for relapse or withdrawal symptoms. - Hypertension: Please review his current blood pressure control and consider any medication adjustments as needed.  2.  Labs / imaging needed at time of follow-up: CMP, CBC  3.  Pending labs/ test needing follow-up: N/A Follow-up Appointments:  Follow-up Information     Maczis, Puja Gosai, PA-C Follow up on 01/13/2025.   Specialty: General Surgery Why: 1/22 at 3:00. Please bring a copy of your photo ID, insurance card and arrive 30 minutes prior to your appointment for paperwork. Contact information: [address] [place] [address] [phone]         Heddy Barren, DO Follow up.   Specialty: Internal Medicine Why: Call Internal Medicine Clinic to Schedule appt for hospital F/U whithin one week. (I have sent a meesage to team to schedule an appt) Contact information: [address] [place] [address] [phone]                Hospital Course by problem list: Kawhi Diebold is a 53 year old man with a history of hypertension, hyperlipidemia, alcohol use disorder in  remission, and prior major abdominal surgery who presented with generalized weakness and imbalance in the setting of acute alcohol relapse and poor oral intake, and was admitted for further management  1. Acute cholecystitis / Cholelithiasis The patient was found to have cholelithiasis on CT imaging, with right upper quadrant ultrasound demonstrating gallstones, mild gallbladder wall thickening, and a positive sonographic Murphy sign, concerning for evolving  acute cholecystitis. General Surgery was consulted. Given ongoing concern for biliary pathology, the patient underwent laparoscopic cholecystectomy on 12/17/2024, with attempted intraoperative cholangiogram. Postoperatively, he recovered without complications. He tolerated diet advancement, had good pain control, and abdominal exams remained benign. Postoperative liver enzymes and bilirubin showed mild transient elevation but improved and did not require further intervention. He was deemed surgically stable for discharge.  2.  Alcohol consumption binge drinking The patient presented after a three-day alcohol relapse following approximately three years of abstinence, with symptoms of tremulousness, anxiety, and generalized weakness. Given recent alcohol use, he was monitored on the CIWA protocol for possible early alcohol withdrawal. However, CIWA scores remained low throughout hospitalization, and he did not develop seizures, hallucinations, autonomic instability, or delirium tremens. His symptoms improved rapidly with supportive care, including IV fluids, electrolyte repletion, and vitamin supplementation, with minimal benzodiazepine requirement. Overall, his clinical course was felt to be more consistent with acute alcohol intoxication and dehydration rather than true alcohol withdrawal.  3. Generalized weakness and imbalance The patient presented with acute generalized weakness and imbalance. Neurologic evaluation was nonfocal, and CT head as well as MRI brain showed no acute intracranial pathology. His symptoms were felt to be multifactorial, related to dehydration, poor oral intake, and acute alcohol effects. Symptoms improved with IV fluids, nutritional support, and electrolyte repletion. He was ambulating independently prior to discharge.  4. Dehydration and poor oral intake The patient reported minimal oral intake during his alcohol relapse and was found to have hemoconcentration on admission labs.  He was treated with intravenous fluids and gradual advancement of diet. Volume status improved, and he was tolerating oral intake at the time of discharge.  5. Electrolyte abnormalities Initial labs demonstrated mild hypokalemia and borderline hypocalcemia, likely secondary to poor intake and alcohol use. Electrolytes were repleted as needed and normalized prior to discharge.  6. Transient chest pain, resolved The patient experienced brief, self-limited chest pain during EMS transport, which resolved prior to admission. EKG showed sinus tachycardia without ischemic changes, and cardiac workup was reassuring. No further episodes occurred during hospitalization, and no additional cardiac evaluation was required.  7. Hypertension The patient has a known history of hypertension. Blood pressures were mildly elevated during hospitalization but remained stable without evidence of hypertensive urgency or end-organ damage. Home lisinopril  was continued.   Stable chronic medical conditions: Hypertension Hyperlipidemia Anxiety  Subjective The patient reports feeling well, with no abdominal pain, nausea, vomiting, chest pain, tremors, dizziness, or weakness. He is tolerating a regular diet, ambulating independently, and feels ready for discharge.  Discharge Exam:   BP (!) 120/91 (BP Location: Left Arm)   Pulse 100   Temp 97.8 F (36.6 C) (Oral)   Resp 17   Ht 5' 2 (1.575 m)   Wt 77 kg   SpO2 96%   BMI 31.05 kg/m  Discharge exam:  General: Awake, alert, no acute distress CV: Regular rate and rhythm, no murmurs Pulmonary: Clear to auscultation bilaterally Abdomen: Soft, non-tender, non-distended; laparoscopic port sites clean, dry, intact Neuro: Alert and oriented 3, no focal deficits, steady gait  Pertinent Labs, Studies, and  Procedures:     Latest Ref Rng & Units 12/18/2024    5:29 AM 12/17/2024    5:22 AM 12/16/2024   11:41 AM  CBC  WBC 4.0 - 10.5 K/uL 10.5  5.4    Hemoglobin 13.0  - 17.0 g/dL 85.4  84.9  82.6   Hematocrit 39.0 - 52.0 % 41.6  45.5  51.0   Platelets 150 - 400 K/uL 191  160         Latest Ref Rng & Units 12/18/2024    5:29 AM 12/17/2024    5:22 AM 12/16/2024   11:41 AM  CMP  Glucose 70 - 99 mg/dL 834  86  888   BUN 6 - 20 mg/dL 12  10  7    Creatinine 0.61 - 1.24 mg/dL 9.19  9.23  9.19   Sodium 135 - 145 mmol/L 134  136  140   Potassium 3.5 - 5.1 mmol/L 4.0  4.1  3.3   Chloride 98 - 111 mmol/L 99  102  100   CO2 22 - 32 mmol/L 24  24    Calcium  8.9 - 10.3 mg/dL 8.4  8.7    Total Protein 6.5 - 8.1 g/dL 7.2  6.4    Total Bilirubin 0.0 - 1.2 mg/dL 1.1  1.6    Alkaline Phos 38 - 126 U/L 148  133    AST 15 - 41 U/L 71  31    ALT 0 - 44 U/L 50  25      MR BRAIN WO CONTRAST Result Date: 12/16/2024 EXAM: MRI Brain Without Contrast 12/16/2024 04:24:00 PM TECHNIQUE: Multiplanar multisequence MRI of the head/brain was performed without the administration of intravenous contrast. COMPARISON: None available. CLINICAL HISTORY: Neuro deficit, acute, stroke suspected FINDINGS: BRAIN AND VENTRICLES: No acute infarct. No intracranial hemorrhage. No mass. No midline shift. No hydrocephalus. The sella is unremarkable. Normal flow voids. ORBITS: No acute abnormality. SINUSES AND MASTOIDS: No acute abnormality. BONES AND SOFT TISSUES: Normal marrow signal. No acute soft tissue abnormality. IMPRESSION: 1. No acute intracranial abnormality. Electronically signed by: Gilmore Molt 12/16/2024 04:37 PM EST RP Workstation: [id number]   US  Abdomen Limited RUQ (LIVER/GB) Result Date: 12/16/2024 CLINICAL DATA:  Pain. EXAM: ULTRASOUND ABDOMEN LIMITED RIGHT UPPER QUADRANT COMPARISON:  July 24, 2020 FINDINGS: Gallbladder: Multiple shadowing, echogenic gallstones are seen within the gallbladder lumen. The largest measures approximately 1.3 cm. Additional 6.0 mm x 2.7 mm x 5.1 mm nonshadowing echogenic focus is seen along the nondependent wall of the gallbladder. There is mild  gallbladder wall thickening (3.7 mm). A positive sonographic Beverley sign is noted by sonographer. Common bile duct: Diameter: 5.7 mm Liver: No focal lesion identified. There is diffusely increased echogenicity of the liver parenchyma. Portal vein is patent on color Doppler imaging with normal direction of blood flow towards the liver. Other: None. IMPRESSION: 1. Cholelithiasis with additional findings consistent with acute cholecystitis. 2. Findings suggestive of a small, likely benign gallbladder polyp. No follow-up imaging is recommended. This recommendation follows ACR consensus guidelines: White Paper of the ACR Incidental Findings Committee II on Gallbladder and Biliary Findings. J Am Coll Radiol 2013:;10:953-956. 3. Hepatic steatosis. Electronically Signed   By: Suzen Dials M.D.   On: 12/16/2024 15:12   CT CHEST ABDOMEN PELVIS W CONTRAST Result Date: 12/16/2024 EXAM: CT CHEST, ABDOMEN AND PELVIS WITH CONTRAST 12/16/2024 12:45:25 PM TECHNIQUE: CT of the chest, abdomen and pelvis was performed with the administration of 75 mL of iohexol  (OMNIPAQUE ) 350 MG/ML injection. Multiplanar reformatted  images are provided for review. Automated exposure control, iterative reconstruction, and/or weight based adjustment of the mA/kV was utilized to reduce the radiation dose to as low as reasonably achievable. COMPARISON: CT angiography chest 09/06/2023, CT abdomen and pelvis 01/26/2023, CT abdomen and pelvis 09/21/2020. CLINICAL HISTORY: Sepsis. FINDINGS: CHEST: MEDIASTINUM AND LYMPH NODES: Heart and pericardium are unremarkable. The central airways are clear. Calcified prevascular lymph node. Calcified left hilar lymph node. The main pulmonary artery is normal in caliber. No central or proximal segmental pulmonary embolus. Limited evaluation more distally due to timing of contrast. The thoracic aorta is normal in caliber. LUNGS AND PLEURA: Lobe mild architectural distortion and scarring. Bibasilar dependent  atelectasis. There is a chronic stable centrally calcified anterior left upper lobe 1.1 x 1.3 cm pulmonary nodule consistent with granulomata. Interval development of a right middle lobe subpleural 4 mm solid micronodule along the minor fissure. No pleural effusion or pneumothorax. ABDOMEN AND PELVIS: LIVER: Diffusely hypodense hepatic parenchyma compared to the spleen. There is a right hepatic dome enhancing lesion mass measuring 1.4 x 1.5 cm that is consistent with a stable known hepatic hemangioma. GALLBLADDER AND BILE DUCTS: Calcified gallstones within the gallbladder lumen. 4 mm calcified gallstone within the cystic duct. No cholelithiasis of the common bile duct. No associated gallbladder wall thickening or pericholecystic fluid. No biliary ductal dilatation. SPLEEN: No acute abnormality. PANCREAS: No acute abnormality. ADRENAL GLANDS: No acute abnormality. KIDNEYS, URETERS AND BLADDER: Fluid attenuation of the right kidney likely represents a simple renal cyst. Simple renal cysts do not require additional follow-up unless clinically indicated due to signs/symptoms. No stones in the kidneys or ureters. No hydronephrosis. No perinephric or periureteral stranding. Urinary bladder is unremarkable. GI AND BOWEL: Stomach demonstrates no acute abnormality. No small or large bowel thickening or dilatation. There is no bowel obstruction. Status post appendectomy. Colonic diverticulosis. REPRODUCTIVE ORGANS: The prostate is prominent in size, measuring up to 4.9 cm. PERITONEUM AND RETROPERITONEUM: No ascites. No free air. VASCULATURE: Aorta is normal in caliber. ABDOMINAL AND PELVIS LYMPH NODES: No lymphadenopathy. BONES AND SOFT TISSUES: Diastasis rectus. No definite abdominal wall hernia. No inguinal hernia. No acute osseous abnormality. No focal soft tissue abnormality. IMPRESSION: 1. No acute findings in the chest, abdomen, and pelvis related to sepsis. 2. Calcified gallstones within the gallbladder lumen and  cystic duct, without CT findings of acute cholecystitis or choledocholithiasis of the common bile duct. 3. New 4 mm right middle lobe solid pulmonary micronodule, with no routine follow-up imaging recommended as per Fleischner Society Guidelines. 4. Sequelae of prior granulomatous disease of the right lung. 5. Other, non-acute and/or normal findings as above. Electronically signed by: Morgane Naveau MD 12/16/2024 01:18 PM EST RP Workstation: [id number]   CT Head Wo Contrast Result Date: 12/16/2024 EXAM: CT HEAD WITHOUT CONTRAST 12/16/2024 12:45:25 PM TECHNIQUE: CT of the head was performed without the administration of intravenous contrast. Automated exposure control, iterative reconstruction, and/or weight based adjustment of the mA/kV was utilized to reduce the radiation dose to as low as reasonably achievable. COMPARISON: CT head 07/20/2023. CLINICAL HISTORY: 53 year old male with sepsis, altered mental status. FINDINGS: BRAIN AND VENTRICLES: No acute hemorrhage. No evidence of acute infarct. No hydrocephalus. No extra-axial collection. No mass effect or midline shift. Brain volume remains normal for age. Stable and normal gray white differentiation for age. No suspicious intracranial vascular hyperdensity. ORBITS: No acute abnormality. SINUSES: Scattered paranasal sinus mucosal thickening and small retention cysts. Aeration appears stable to improved from last year. SOFT TISSUES  AND SKULL: Chronic left forehead and vertex scalp soft tissue scarring. Tympanic cavities and mastoids appear well aerated. No skull fracture. IMPRESSION: 1. Normal for age non-contrast CT appearance of the brain. Electronically signed by: Helayne Hurst MD 12/16/2024 12:57 PM EST RP Workstation: [id number]   DG Chest Portable 1 View Result Date: 12/16/2024 CLINICAL DATA:  Shortness of breath. EXAM: PORTABLE CHEST 1 VIEW COMPARISON:  09/06/2023 FINDINGS: Low volume film. The cardio pericardial silhouette is enlarged. There is  pulmonary vascular congestion without overt pulmonary edema. No acute bony abnormality. Telemetry leads overlie the chest. IMPRESSION: Low volume film with vascular congestion. Electronically Signed   By: Camellia Candle M.D.   On: 12/16/2024 11:35     Discharge Instructions: Discharge Instructions     Discharge instructions   Complete by: As directed    CCS CIRUGA DE [facility]  CIRUGA LAPAROSCPICA: INSTRUCCIONES POST OPERACIONALES Revise siempre la hoja de instrucciones de alta que le entreg el centro donde se realiz la ciruga. SI TIENE FORMULARIOS DE LICENCIA POR DISCAPACIDAD O FAMILIAR, DEBE TRAERLOS A LA OFICINA PARA SU PROCESAMIENTO. NO SE LOS D A SU MDICO.  CONTROL DE DOLOR 1. Rgimen del dolor: tome tylenol  (acetaminofn) de venta libre 1000 mg cada seis horas, ibuprofeno recetado (600 mg) cada seis horas y robaxina (metocarbamol) 750 mg cada seis horas. Con los tres, deberas tomar the sherwin-williams. Ejemplo: tylenol  (paracetamol) a las 8 a.m., ibuprofeno a las 10 a.m., robaxina (metocarbamol) a las 12 p.m., tylenol  (acetaminofn) nuevamente a las 2 p.m., ibuprofeno nuevamente a las 4 p.m., robaxina (metocarbamol) a las 6 p.m. Tambin tiene receta mdica para oxicodona, que debe tomar si el tylenol  (acetaminofn), el ibuprofeno y la robaxina (metocarbamol) no son suficientes para human resources officer. Puede tomar oxicodona con tanta frecuencia como cada cuatro horas, segn sea necesario, pero si est tomando los otros medicamentos como se indic anteriormente, no debera necesitar oxicodona con tanta frecuencia. Tambin le han recetado Miralax , que es un ablandador de heces. Tmelo segn lo prescrito porque la oxicodona puede causar estreimiento y Miralax  minimizar o evitar el estreimiento. No conduzca mientras est tomando o bajo la influencia de oxicodona, ya que es un medicamento narctico. 2. Utilice compresas de hielo para ayudar a human resources officer. 3. Si  necesita un resurtido de su analgsico, comunquese con su farmacia. Se comunicarn con nuestra oficina para solicitar autorizacin. Las recetas no se surtirn despus de las 5 p. m. ni los fines de [place].  MEDICAMENTOS DOMSTICOS 4. Tome los medicamentos que le recetan habitualmente, a menos que se le indique lo contrario.  DIETA 5. Conviene seguir una dieta ligera los primeros das tras la llegada a casa. Asegrese de incluir muchos lquidos diariamente. 6. No consuma alcohol mientras est tomando oxicodona o ibuprofeno.  CONSTIPACIN 7. Es comn experimentar algo de estreimiento despus de ignacia ciruga y si est tomando analgsicos. El estreimiento administrator, arts abdominal, por lo que es mejor prevenirlo aumentando la ingesta de lquidos y tomando un ablandador de health visitor. Ya le han recetado un laxante suave, Miralax , que debe tomar una vez al da. Puede aumentar la dosis de Miralax  a dos o incluso tres veces al da hasta que tenga una evacuacin intestinal. Si 24 horas despus de tomar Miralax  tres veces al da sigue sin evacuar, puede probar con citrato de magnesio, disponible sin receta en su farmacia local.  CUIDADO DE HERIDAS/INCISIONES 8. La mayora de los pacientes experimentarn algo de hinchazn y hematomas en el rea de  las incisiones. Las bolsas de hielo ayudarn. La hinchazn y los hematomas pueden tardar aetna. 9. Mennie de mayo a malinda de 27 Deciembre 2025. 10. No retire ni frote el pegamento para la piel. Puede permitir que agua tibia y jabn corra sobre la incisin, luego enjuagar y haematologist. 11. No se sumerja en agua (baeras, jacuzzis, piscinas, lagos, ocanos) durante c.h. [place]inson worldwide.  ACTIVIDADES 12. Puede reanudar las actividades diarias habituales (ligeras) a glass blower/designer del da siguiente, como el cuidado personal diario, advertising account planner, subir escaleras, aumentando gradualmente las actividades segn las [place]. Puede tener relaciones sexuales cuando le resulte  cmodo. 13. No levantar ms de 5 libras durante seis semanas. 14. Puede conducir cuando ya no est tomando analgsicos narcticos, puede usar cmodamente el cinturn de seguridad y puede maniobrar su automvil y contractor los frenos con seguridad.  HACER UN SEGUIMIENTO 15. Debe consultar a su mdico en el consultorio para una cita de seguimiento aproximadamente 2 a 3 semanas despus de la ciruga. Se le debera haber dado su cita postoperatoria/de seguimiento cuando se program su ciruga. Si no recibi una cita postoperatoria/de seguimiento, asegrese de llamar para programar esta cita dentro de uno o [employer] de llegar a casa para asegurar una cita conveniente.  CUNDO LLAMAR A SU MDICO: 1. Fiebre superior a 101,5 2. Incapacidad para orinar 3. Sangrado continuo de la incisin.   Increase activity slowly   Complete by: As directed        Signed: Trudy Mliss Dragon, MD 12/18/2024, 2:32 PM     "

## 2024-12-18 NOTE — Progress Notes (Signed)
 Reviewed AVS, patient expressed understanding of medications, MD follow up reviewed.   Removed IV, Site clean, dry and intact.  See LDA for information on wounds at discharge. Patient states all belongings brought to the hospital at time of admission are accounted for and packed to take home.  Picked up medications from [facility] pharmacy. Lead Transport contacted to transport patient to Discharge lounge to wait for transportation home.

## 2024-12-20 ENCOUNTER — Telehealth: Payer: Self-pay

## 2024-12-20 LAB — VITAMIN B1: Vitamin B1 (Thiamine): 166.2 nmol/L (ref 66.5–200.0)

## 2024-12-20 LAB — SURGICAL PATHOLOGY

## 2024-12-20 NOTE — Transitions of Care (Post Inpatient/ED Visit) (Unsigned)
" ° °  12/20/2024  Name: Carl Briggs MRN: [id number] DOB: March 15, 1971  Today's TOC FU Call Status: Today's TOC FU Call Status:: Unsuccessful Call (1st Attempt) Unsuccessful Call (1st Attempt) Date: 12/20/24  Attempted to reach the patient regarding the most recent Inpatient/ED visit.  Follow Up Plan: Additional outreach attempts will be made to reach the patient to complete the Transitions of Care (Post Inpatient/ED visit) call.   Signature Julian Lemmings, LPN [facility] Nurse Health Advisor Direct Dial [phone]  "

## 2024-12-21 NOTE — Transitions of Care (Post Inpatient/ED Visit) (Signed)
" ° °  12/21/2024  Name: Carl Briggs MRN: [id number] DOB: 01/21/1971  Today's TOC FU Call Status: Today's TOC FU Call Status:: Successful TOC FU Call Completed Unsuccessful Call (1st Attempt) Date: 12/20/24 [facility] FU Call Complete Date: 12/21/24  Patient's Name and Date of Birth confirmed. Name, DOB  Transition Care Management Follow-up Telephone Call Date of Discharge: 12/18/24 Discharge Facility: [facility]) Type of Discharge: Inpatient Admission Primary Inpatient Discharge Diagnosis:: alcohol use How have you been since you were released from the hospital?: Better Any questions or concerns?: No  Items Reviewed: Did you receive and understand the discharge instructions provided?: Yes Medications obtained,verified, and reconciled?: Yes (Medications Reviewed) Any new allergies since your discharge?: No Dietary orders reviewed?: Yes Do you have support at home?: Yes People in Home [RPT]: spouse  Medications Reviewed Today: Medications Reviewed Today     Reviewed by Emmitt Pan, LPN (Licensed Practical Nurse) on 12/21/24 at 1534  Med List Status: <None>   Medication Order Taking? Sig Documenting Provider Last Dose Status Informant  acetaminophen  (TYLENOL ) 500 MG tablet [id number] Yes Take 2 tablets (1,000 mg total) by mouth every 8 (eight) hours as needed for up to 10 days. Azadegan, Maryam, MD  Active   escitalopram  (LEXAPRO ) 10 MG tablet [id number] Yes Take 1 tablet (10 mg total) by mouth daily. Azadegan, Maryam, MD  Active   famotidine  (PEPCID ) 20 MG tablet [id number] Yes Take 1 tablet (20 mg total) by mouth 2 (two) times daily. Azadegan, Maryam, MD  Active   folic acid  (FOLVITE ) 1 MG tablet [id number] Yes Take 1 tablet (1 mg total) by mouth daily. Azadegan, Maryam, MD  Active   lisinopril  (ZESTRIL ) 20 MG tablet [id number] Yes Take 20 mg by mouth daily. [provider]  Active Spouse/Significant Other, Self, Pharmacy Records           Med Note EFRAIM,  ALFREIDA CROME   Fri Dec 17, 2024  3:38 PM) Per patients wife and confirmed by the patient, this is the only medication he is taking  Multiple Vitamin (MULTIVITAMIN WITH MINERALS) TABS tablet [id number] Yes Take 1 tablet by mouth daily. Azadegan, Maryam, MD  Active   polyethylene glycol powder (MIRALAX ) 17 GM/SCOOP powder [id number] Yes Take 17 g by mouth daily. Dissolve 1 capful (17g) in 4-8 ounces of liquid and take by mouth daily. Azadegan, Maryam, MD  Active   thiamine  (VITAMIN B1) 100 MG tablet [id number] Yes Take 1 tablet (100 mg total) by mouth daily. Azadegan, Maryam, MD  Active   Med List Note (Satterfield, Darius E, CPhT 09/06/23 1517): Daughter:Taly [phone]            Home Care and Equipment/Supplies: Were Home Health Services Ordered?: NA Any new equipment or medical supplies ordered?: NA  Functional Questionnaire: Do you need assistance with bathing/showering or dressing?: No Do you need assistance with meal preparation?: No Do you need assistance with eating?: No Do you have difficulty maintaining continence: No Do you need assistance with getting out of bed/getting out of a chair/moving?: No Do you have difficulty managing or taking your medications?: No  Follow up appointments reviewed: PCP Follow-up appointment confirmed?: No (sent to staff to schedule) MD Provider Line Number:[phone] Given: No Specialist Hospital Follow-up appointment confirmed?: NA Do you need transportation to your follow-up appointment?: No Do you understand care options if your condition(s) worsen?: Yes-patient verbalized understanding    SIGNATURE Pan Emmitt, LPN [facility] Nurse Health Advisor Direct Dial [phone]  "

## 2024-12-27 ENCOUNTER — Telehealth: Payer: Self-pay | Admitting: *Deleted

## 2024-12-27 NOTE — Telephone Encounter (Signed)
 Called pt to schedule HFU appt - no answer nor vm; unable to leave a message.

## 2024-12-27 NOTE — Telephone Encounter (Signed)
-----   Message from [facility] sent at 12/18/2024 12:37 PM EST ----- Hello team, could you please schedule an appt for him for1 week hospital F/U. Discharge date: 12/18/2024. Thank you

## 2025-01-03 NOTE — Telephone Encounter (Signed)
 Pt has an appt 12/2024

## 2025-01-11 ENCOUNTER — Ambulatory Visit: Payer: Self-pay | Admitting: Student

## 2025-01-11 ENCOUNTER — Other Ambulatory Visit: Payer: Self-pay

## 2025-01-11 VITALS — BP 127/89 | HR 88 | Temp 97.9°F | Resp 24 | Ht 61.02 in

## 2025-01-11 DIAGNOSIS — Z9049 Acquired absence of other specified parts of digestive tract: Secondary | ICD-10-CM

## 2025-01-11 DIAGNOSIS — F1090 Alcohol use, unspecified, uncomplicated: Secondary | ICD-10-CM

## 2025-01-11 DIAGNOSIS — R7989 Other specified abnormal findings of blood chemistry: Secondary | ICD-10-CM

## 2025-01-11 DIAGNOSIS — I10 Essential (primary) hypertension: Secondary | ICD-10-CM

## 2025-01-11 DIAGNOSIS — F419 Anxiety disorder, unspecified: Secondary | ICD-10-CM

## 2025-01-11 NOTE — Assessment & Plan Note (Signed)
 Reports that he has not had any alcohol since discharge. Denies any cravings.  - F/u CMP

## 2025-01-11 NOTE — Assessment & Plan Note (Signed)
 BP Readings from Last 3 Encounters:  01/11/25 127/89  12/18/24 (!) 120/91  04/18/24 118/84  Blood pressure 127/89, home medication consist of lisinopril  20 mg daily.  - Continue lisinopril  20 mg daily

## 2025-01-11 NOTE — Progress Notes (Unsigned)
 "  Established Patient Office Visit  Subjective   Patient ID: Carl Briggs, male    DOB: 03/17/1971  Age: 54 y.o. MRN: [id number]  Chief Complaint  Patient presents with   Abdominal Pain    Gallbadder removal 2 weeks ago    Carl Briggs is a 54 y.o. male with past medical history of hypertension, hyperlipidemia, AUD in remission presents today for hospital follow-up. Patient was recently admitted on 12/16/2024 for acute cholecystitis status post lap cholecystectomy on 12/17/2024.  Patient was discharged on 12/18/2024.  Review of Systems:  As per assessment and Plan   Objective:     Vitals:   01/11/25 1559  BP: 127/89  Pulse: 88  Resp: (!) 24  Temp: 97.9 F (36.6 C)  TempSrc: Oral  SpO2: 98%  Height: 5' 1.02 (1.55 m)    Physical Exam General: Sitting in chair, no acute distress Cardiovascular: Regular rate Pulmonary: Breathing comfortably Abdomen: Soft, nontender, nondistended MSK: No lower extremity edema bilaterally  Last CBC Lab Results  Component Value Date   WBC 6.0 01/11/2025   HGB 14.0 01/11/2025   HCT 42.0 01/11/2025   MCV 91 01/11/2025   MCH 30.2 01/11/2025   RDW 14.2 01/11/2025   PLT 221 01/11/2025   Last metabolic panel Lab Results  Component Value Date   GLUCOSE 115 (H) 01/11/2025   NA 144 01/11/2025   K 3.9 01/11/2025   CL 108 (H) 01/11/2025   CO2 19 (L) 01/11/2025   BUN 12 01/11/2025   CREATININE 0.89 01/11/2025   GFRNONAA >60 12/18/2024   CALCIUM  9.1 01/11/2025   PHOS 2.9 08/12/2021   PROT 7.3 01/11/2025   ALBUMIN  4.2 01/11/2025   LABGLOB 3.1 01/11/2025   AGRATIO 1.3 12/14/2018   BILITOT 0.4 01/11/2025   ALKPHOS 147 (H) 01/11/2025   AST 30 01/11/2025   ALT 30 01/11/2025   ANIONGAP 11 12/18/2024      The ASCVD Risk score (Arnett DK, et al., 2019) failed to calculate for the following reasons:   Risk score cannot be calculated because patient has a medical history suggesting prior/existing ASCVD   * -  Cholesterol units were assumed    Assessment & Plan:   Patient discussed with Dr. Lovie  Assessment & Plan S/P laparoscopic cholecystectomy Admitted for gallstones and hyperbilirubinemia status post lap cholecystectomy with attempted intraoperative cholangiogram 12/17/2024. Reports symptoms of mild tenderness that is tolerable, no nausea or vomiting or diarrhea or fever. Tolerating PO well. Patient has a follow up appointment on 01/13/2025 with general surgery.  - F/u on CMP and CBC  Alcohol use disorder Reports that he has not had any alcohol since discharge. Denies any cravings.  - F/u CMP   Anxiety GAD 7 score of 3; home medication consist of Lexipro  - Continue Lexipro 10 mg daily  Primary hypertension BP Readings from Last 3 Encounters:  01/11/25 127/89  12/18/24 (!) 120/91  04/18/24 118/84  Blood pressure 127/89, home medication consist of lisinopril  20 mg daily.  - Continue lisinopril  20 mg daily  Elevated LFTs Lab Results  Component Value Date   ALT 30 01/11/2025   AST 30 01/11/2025   ALKPHOS 147 (H) 01/11/2025   BILITOT 0.4 01/11/2025   - Repeat CMP today    Problem List Items Addressed This Visit       Cardiovascular and Mediastinum   Primary hypertension   BP Readings from Last 3 Encounters:  01/11/25 127/89  12/18/24 (!) 120/91  04/18/24 118/84  Blood pressure  127/89, home medication consist of lisinopril  20 mg daily.  - Continue lisinopril  20 mg daily         Digestive   Calculus of gallbladder with acute cholecystitis - Primary   Admitted for gallstones and hyperbilirubinemia status post lap cholecystectomy with attempted intraoperative cholangiogram 12/17/2024. Reports symptoms of mild tenderness that is tolerable, no nausea or vomiting or diarrhea or fever. Tolerating PO well. Patient has a follow up appointment on 01/13/2025 with general surgery.  - F/u on CMP and CBC         Other   Anxiety   GAD 7 score of 3; home medication consist of  Lexipro  - Continue Lexipro 10 mg daily       Alcohol intoxication in episodic drinker   Reports that he has not had any alcohol since discharge. Denies any cravings.  - F/u CMP        Elevated LFTs   Lab Results  Component Value Date   ALT 30 01/11/2025   AST 30 01/11/2025   ALKPHOS 147 (H) 01/11/2025   BILITOT 0.4 01/11/2025   - Repeat CMP today        No follow-ups on file.    Toma Edwards, DO "

## 2025-01-11 NOTE — Assessment & Plan Note (Signed)
 GAD 7 score of 3; home medication consist of Lexipro  - Continue Lexipro 10 mg daily

## 2025-01-11 NOTE — Assessment & Plan Note (Addendum)
 Admitted for gallstones and hyperbilirubinemia status post lap cholecystectomy with attempted intraoperative cholangiogram 12/17/2024. Reports symptoms of mild tenderness that is tolerable, no nausea or vomiting or diarrhea or fever. Tolerating PO well. Patient has a follow up appointment on 01/13/2025 with general surgery.  - F/u on CMP and CBC

## 2025-01-11 NOTE — Patient Instructions (Addendum)
 Kathlynn, Sr. [address] [employer], por permitirnos brindarle atencin hoy. Hoy hablamos sobre lo siguiente:  Para su presin arterial: - Tome lisinopril  20 mg, 1 comprimido por va oral al da.  Para su ansiedad: - Tome Lexapro  10 mg, 1 comprimido por va oral al da.  Por favor, contine sin consumir alcohol.  Asegrese de programar una cita de seguimiento con el servicio de ciruga general.  Asegrese de completar los formularios de asistencia financiera.  He solicitado los siguientes anlisis de laboratorio:   Referencias No se solicitaron referencias hoy.   He recetado los siguientes medicamentos/he modificado los siguientes medicamentos:  Suspender los siguientes medicamentos: Medicamentos suspendidos durante esta consulta Medicamento Motivo  cido flico (FOLVITE ) 1 mg comprimido  Multivitamnico (MULTIVITAMNICO CON MINERALES) comprimidos  Tiamina (VITAMINA B1) 100 mg comprimido   Iniciar los siguientes medicamentos: No se recetaron medicamentos de este tipo en esta consulta.   Seguimiento: Seguimiento de rutina en 3 meses   Recuerde:  Si tiene alguna pregunta o inquietud, llame a la clnica de medicina interna al [phone].   [facility] [address] [facility] [employer]

## 2025-01-11 NOTE — Assessment & Plan Note (Signed)
 Lab Results  Component Value Date   ALT 50 (H) 12/18/2024   AST 71 (H) 12/18/2024   ALKPHOS 148 (H) 12/18/2024   BILITOT 1.1 12/18/2024   - Repeat CMP today

## 2025-01-12 ENCOUNTER — Ambulatory Visit: Payer: Self-pay | Admitting: Student

## 2025-01-12 LAB — COMPREHENSIVE METABOLIC PANEL WITH GFR
ALT: 30 IU/L (ref 0–44)
AST: 30 IU/L (ref 0–40)
Albumin: 4.2 g/dL (ref 3.8–4.9)
Alkaline Phosphatase: 147 IU/L — ABNORMAL HIGH (ref 47–123)
BUN/Creatinine Ratio: 13 (ref 9–20)
BUN: 12 mg/dL (ref 6–24)
Bilirubin Total: 0.4 mg/dL (ref 0.0–1.2)
CO2: 19 mmol/L — ABNORMAL LOW (ref 20–29)
Calcium: 9.1 mg/dL (ref 8.7–10.2)
Chloride: 108 mmol/L — ABNORMAL HIGH (ref 96–106)
Creatinine, Ser: 0.89 mg/dL (ref 0.76–1.27)
Globulin, Total: 3.1 g/dL (ref 1.5–4.5)
Glucose: 115 mg/dL — ABNORMAL HIGH (ref 70–99)
Potassium: 3.9 mmol/L (ref 3.5–5.2)
Sodium: 144 mmol/L (ref 134–144)
Total Protein: 7.3 g/dL (ref 6.0–8.5)
eGFR: 102 mL/min/1.73

## 2025-01-12 LAB — CBC
Hematocrit: 42 % (ref 37.5–51.0)
Hemoglobin: 14 g/dL (ref 13.0–17.7)
MCH: 30.2 pg (ref 26.6–33.0)
MCHC: 33.3 g/dL (ref 31.5–35.7)
MCV: 91 fL (ref 79–97)
Platelets: 221 x10E3/uL (ref 150–450)
RBC: 4.63 x10E6/uL (ref 4.14–5.80)
RDW: 14.2 % (ref 11.6–15.4)
WBC: 6 x10E3/uL (ref 3.4–10.8)

## 2025-01-20 NOTE — Progress Notes (Signed)
 [facility] Clinic Attending  Case discussed with the resident at the time of the visit.  We reviewed the resident's history and exam and pertinent patient test results.  I agree with the assessment, diagnosis, and plan of care documented in the resident's note.
